# Patient Record
Sex: Female | Born: 1945 | Hispanic: No | Marital: Single | Smoking: Former smoker
Health system: Southern US, Community
[De-identification: ages and names within clinical notes are randomized; demographics above are authoritative.]

## PROBLEM LIST (undated history)

## (undated) DIAGNOSIS — G2581 Restless legs syndrome: Secondary | ICD-10-CM

## (undated) DIAGNOSIS — IMO0002 Reserved for concepts with insufficient information to code with codable children: Secondary | ICD-10-CM

## (undated) DIAGNOSIS — I513 Intracardiac thrombosis, not elsewhere classified: Secondary | ICD-10-CM

## (undated) DIAGNOSIS — F32A Depression, unspecified: Secondary | ICD-10-CM

## (undated) DIAGNOSIS — R51 Headache: Secondary | ICD-10-CM

## (undated) DIAGNOSIS — Z9229 Personal history of other drug therapy: Secondary | ICD-10-CM

## (undated) DIAGNOSIS — Z9981 Dependence on supplemental oxygen: Secondary | ICD-10-CM

## (undated) DIAGNOSIS — C801 Malignant (primary) neoplasm, unspecified: Secondary | ICD-10-CM

## (undated) DIAGNOSIS — I639 Cerebral infarction, unspecified: Secondary | ICD-10-CM

## (undated) DIAGNOSIS — J449 Chronic obstructive pulmonary disease, unspecified: Secondary | ICD-10-CM

## (undated) DIAGNOSIS — N189 Chronic kidney disease, unspecified: Secondary | ICD-10-CM

## (undated) DIAGNOSIS — I5022 Chronic systolic (congestive) heart failure: Secondary | ICD-10-CM

## (undated) DIAGNOSIS — I10 Essential (primary) hypertension: Secondary | ICD-10-CM

## (undated) DIAGNOSIS — Z8719 Personal history of other diseases of the digestive system: Secondary | ICD-10-CM

## (undated) DIAGNOSIS — D649 Anemia, unspecified: Secondary | ICD-10-CM

## (undated) DIAGNOSIS — D696 Thrombocytopenia, unspecified: Secondary | ICD-10-CM

## (undated) DIAGNOSIS — J45909 Unspecified asthma, uncomplicated: Secondary | ICD-10-CM

## (undated) DIAGNOSIS — G4730 Sleep apnea, unspecified: Secondary | ICD-10-CM

## (undated) DIAGNOSIS — I2510 Atherosclerotic heart disease of native coronary artery without angina pectoris: Secondary | ICD-10-CM

## (undated) DIAGNOSIS — I219 Acute myocardial infarction, unspecified: Secondary | ICD-10-CM

## (undated) DIAGNOSIS — M1990 Unspecified osteoarthritis, unspecified site: Secondary | ICD-10-CM

## (undated) DIAGNOSIS — F329 Major depressive disorder, single episode, unspecified: Secondary | ICD-10-CM

## (undated) DIAGNOSIS — I4819 Other persistent atrial fibrillation: Secondary | ICD-10-CM

## (undated) DIAGNOSIS — I272 Pulmonary hypertension, unspecified: Secondary | ICD-10-CM

## (undated) DIAGNOSIS — E119 Type 2 diabetes mellitus without complications: Secondary | ICD-10-CM

## (undated) DIAGNOSIS — R9430 Abnormal result of cardiovascular function study, unspecified: Secondary | ICD-10-CM

## (undated) HISTORY — DX: Reserved for concepts with insufficient information to code with codable children: IMO0002

## (undated) HISTORY — DX: Pulmonary hypertension, unspecified: I27.20

## (undated) HISTORY — PX: OTHER SURGICAL HISTORY: SHX169

## (undated) HISTORY — DX: Personal history of other drug therapy: Z92.29

## (undated) HISTORY — DX: Other persistent atrial fibrillation: I48.19

## (undated) HISTORY — PX: ABDOMINAL HYSTERECTOMY: SHX81

## (undated) HISTORY — PX: TONSILLECTOMY: SUR1361

## (undated) HISTORY — DX: Cerebral infarction, unspecified: I63.9

## (undated) HISTORY — DX: Abnormal result of cardiovascular function study, unspecified: R94.30

---

## 1998-03-31 HISTORY — PX: OTHER SURGICAL HISTORY: SHX169

## 1999-04-01 HISTORY — PX: CORONARY ARTERY BYPASS GRAFT: SHX141

## 2011-04-01 HISTORY — PX: CARDIAC CATHETERIZATION: SHX172

## 2012-03-25 ENCOUNTER — Inpatient Hospital Stay (HOSPITAL_COMMUNITY)
Admission: AD | Admit: 2012-03-25 | Discharge: 2012-04-19 | DRG: 280 | Disposition: A | Discharge status: Skilled Nursing Facility | Payer: Medicare Other | Source: Other Acute Inpatient Hospital | Attending: Cardiovascular Disease | Admitting: Cardiovascular Disease

## 2012-03-25 DIAGNOSIS — J449 Chronic obstructive pulmonary disease, unspecified: Secondary | ICD-10-CM | POA: Diagnosis present

## 2012-03-25 DIAGNOSIS — D696 Thrombocytopenia, unspecified: Secondary | ICD-10-CM | POA: Diagnosis not present

## 2012-03-25 DIAGNOSIS — J189 Pneumonia, unspecified organism: Secondary | ICD-10-CM | POA: Diagnosis not present

## 2012-03-25 DIAGNOSIS — I5023 Acute on chronic systolic (congestive) heart failure: Secondary | ICD-10-CM | POA: Diagnosis present

## 2012-03-25 DIAGNOSIS — N19 Unspecified kidney failure: Secondary | ICD-10-CM

## 2012-03-25 DIAGNOSIS — I252 Old myocardial infarction: Secondary | ICD-10-CM

## 2012-03-25 DIAGNOSIS — J9620 Acute and chronic respiratory failure, unspecified whether with hypoxia or hypercapnia: Secondary | ICD-10-CM | POA: Diagnosis not present

## 2012-03-25 DIAGNOSIS — I509 Heart failure, unspecified: Secondary | ICD-10-CM | POA: Diagnosis present

## 2012-03-25 DIAGNOSIS — I4729 Other ventricular tachycardia: Secondary | ICD-10-CM | POA: Diagnosis not present

## 2012-03-25 DIAGNOSIS — R29810 Facial weakness: Secondary | ICD-10-CM | POA: Diagnosis not present

## 2012-03-25 DIAGNOSIS — Z9861 Coronary angioplasty status: Secondary | ICD-10-CM

## 2012-03-25 DIAGNOSIS — J4489 Other specified chronic obstructive pulmonary disease: Secondary | ICD-10-CM | POA: Diagnosis present

## 2012-03-25 DIAGNOSIS — K219 Gastro-esophageal reflux disease without esophagitis: Secondary | ICD-10-CM | POA: Diagnosis present

## 2012-03-25 DIAGNOSIS — I2510 Atherosclerotic heart disease of native coronary artery without angina pectoris: Secondary | ICD-10-CM | POA: Diagnosis present

## 2012-03-25 DIAGNOSIS — I428 Other cardiomyopathies: Secondary | ICD-10-CM | POA: Diagnosis present

## 2012-03-25 DIAGNOSIS — I5189 Other ill-defined heart diseases: Secondary | ICD-10-CM | POA: Diagnosis present

## 2012-03-25 DIAGNOSIS — E662 Morbid (severe) obesity with alveolar hypoventilation: Secondary | ICD-10-CM | POA: Diagnosis present

## 2012-03-25 DIAGNOSIS — G8929 Other chronic pain: Secondary | ICD-10-CM | POA: Diagnosis present

## 2012-03-25 DIAGNOSIS — G4733 Obstructive sleep apnea (adult) (pediatric): Secondary | ICD-10-CM | POA: Diagnosis present

## 2012-03-25 DIAGNOSIS — I4891 Unspecified atrial fibrillation: Principal | ICD-10-CM | POA: Diagnosis present

## 2012-03-25 DIAGNOSIS — I6340 Cerebral infarction due to embolism of unspecified cerebral artery: Secondary | ICD-10-CM | POA: Diagnosis present

## 2012-03-25 DIAGNOSIS — G2581 Restless legs syndrome: Secondary | ICD-10-CM | POA: Diagnosis present

## 2012-03-25 DIAGNOSIS — I214 Non-ST elevation (NSTEMI) myocardial infarction: Secondary | ICD-10-CM | POA: Diagnosis not present

## 2012-03-25 DIAGNOSIS — Z951 Presence of aortocoronary bypass graft: Secondary | ICD-10-CM

## 2012-03-25 DIAGNOSIS — Z6841 Body Mass Index (BMI) 40.0 and over, adult: Secondary | ICD-10-CM

## 2012-03-25 DIAGNOSIS — I639 Cerebral infarction, unspecified: Secondary | ICD-10-CM

## 2012-03-25 DIAGNOSIS — Z8673 Personal history of transient ischemic attack (TIA), and cerebral infarction without residual deficits: Secondary | ICD-10-CM

## 2012-03-25 DIAGNOSIS — I2589 Other forms of chronic ischemic heart disease: Secondary | ICD-10-CM | POA: Diagnosis present

## 2012-03-25 DIAGNOSIS — H539 Unspecified visual disturbance: Secondary | ICD-10-CM | POA: Diagnosis not present

## 2012-03-25 DIAGNOSIS — N179 Acute kidney failure, unspecified: Secondary | ICD-10-CM | POA: Diagnosis present

## 2012-03-25 DIAGNOSIS — I2582 Chronic total occlusion of coronary artery: Secondary | ICD-10-CM | POA: Diagnosis present

## 2012-03-25 DIAGNOSIS — D649 Anemia, unspecified: Secondary | ICD-10-CM

## 2012-03-25 DIAGNOSIS — R5381 Other malaise: Secondary | ICD-10-CM | POA: Diagnosis not present

## 2012-03-25 DIAGNOSIS — R791 Abnormal coagulation profile: Secondary | ICD-10-CM | POA: Diagnosis not present

## 2012-03-25 DIAGNOSIS — I472 Ventricular tachycardia, unspecified: Secondary | ICD-10-CM | POA: Diagnosis not present

## 2012-03-25 DIAGNOSIS — I129 Hypertensive chronic kidney disease with stage 1 through stage 4 chronic kidney disease, or unspecified chronic kidney disease: Secondary | ICD-10-CM | POA: Diagnosis present

## 2012-03-25 DIAGNOSIS — N189 Chronic kidney disease, unspecified: Secondary | ICD-10-CM | POA: Diagnosis present

## 2012-03-25 DIAGNOSIS — D509 Iron deficiency anemia, unspecified: Secondary | ICD-10-CM | POA: Diagnosis present

## 2012-03-25 DIAGNOSIS — Z96659 Presence of unspecified artificial knee joint: Secondary | ICD-10-CM

## 2012-03-25 DIAGNOSIS — E875 Hyperkalemia: Secondary | ICD-10-CM | POA: Diagnosis not present

## 2012-03-25 DIAGNOSIS — E785 Hyperlipidemia, unspecified: Secondary | ICD-10-CM | POA: Diagnosis present

## 2012-03-25 HISTORY — DX: Sleep apnea, unspecified: G47.30

## 2012-03-25 HISTORY — DX: Anemia, unspecified: D64.9

## 2012-03-25 HISTORY — DX: Essential (primary) hypertension: I10

## 2012-03-25 HISTORY — DX: Personal history of other diseases of the digestive system: Z87.19

## 2012-03-25 HISTORY — DX: Acute myocardial infarction, unspecified: I21.9

## 2012-03-25 HISTORY — DX: Unspecified osteoarthritis, unspecified site: M19.90

## 2012-03-25 HISTORY — DX: Chronic systolic (congestive) heart failure: I50.22

## 2012-03-25 HISTORY — DX: Intracardiac thrombosis, not elsewhere classified: I51.3

## 2012-03-25 HISTORY — DX: Type 2 diabetes mellitus without complications: E11.9

## 2012-03-25 HISTORY — DX: Dependence on supplemental oxygen: Z99.81

## 2012-03-25 HISTORY — DX: Restless legs syndrome: G25.81

## 2012-03-25 HISTORY — DX: Chronic obstructive pulmonary disease, unspecified: J44.9

## 2012-03-25 HISTORY — DX: Morbid (severe) obesity due to excess calories: E66.01

## 2012-03-25 HISTORY — DX: Atherosclerotic heart disease of native coronary artery without angina pectoris: I25.10

## 2012-03-25 HISTORY — DX: Headache: R51

## 2012-03-25 HISTORY — DX: Chronic kidney disease, unspecified: N18.9

## 2012-03-25 HISTORY — DX: Depression, unspecified: F32.A

## 2012-03-25 HISTORY — DX: Major depressive disorder, single episode, unspecified: F32.9

## 2012-03-25 HISTORY — DX: Cerebral infarction, unspecified: I63.9

## 2012-03-25 HISTORY — DX: Thrombocytopenia, unspecified: D69.6

## 2012-03-25 MED ORDER — MORPHINE SULFATE 2 MG/ML IJ SOLN
1.0000 mg | INTRAMUSCULAR | Status: DC | PRN
  Administered 2012-03-29 – 2012-04-06 (×3): 1 mg via INTRAVENOUS
  Filled 2012-03-25 (×4): qty 1

## 2012-03-25 MED ORDER — ONDANSETRON HCL 4 MG/2ML IJ SOLN
4.0000 mg | Freq: Four times a day (QID) | INTRAMUSCULAR | Status: DC | PRN
  Administered 2012-04-08 – 2012-04-14 (×14): 4 mg via INTRAVENOUS
  Filled 2012-03-25 (×14): qty 2

## 2012-03-25 MED ORDER — HYDROCODONE-ACETAMINOPHEN 5-325 MG PO TABS
1.0000 | ORAL_TABLET | ORAL | Status: DC | PRN
  Administered 2012-03-28: 2 via ORAL
  Administered 2012-04-01 (×2): 1 via ORAL
  Administered 2012-04-07 – 2012-04-13 (×7): 2 via ORAL
  Filled 2012-03-25 (×4): qty 2
  Filled 2012-03-25: qty 1
  Filled 2012-03-25 (×5): qty 2

## 2012-03-25 MED ORDER — ALBUTEROL SULFATE (5 MG/ML) 0.5% IN NEBU: 2.5000 mg | INHALATION_SOLUTION | RESPIRATORY_TRACT | Status: DC | PRN

## 2012-03-25 MED ORDER — ZOLPIDEM TARTRATE 5 MG PO TABS: 5.0000 mg | ORAL_TABLET | Freq: Every evening | ORAL | Status: DC | PRN

## 2012-03-25 MED ORDER — SODIUM CHLORIDE 0.9 % IV SOLN
250.0000 mL | INTRAVENOUS | Status: DC | PRN
Start: 2012-03-25 — End: 2012-04-19
  Administered 2012-03-30: 20:00:00 via INTRAVENOUS
  Administered 2012-04-01: 500 mL via INTRAVENOUS

## 2012-03-25 MED ORDER — ASPIRIN EC 81 MG PO TBEC
81.0000 mg | DELAYED_RELEASE_TABLET | Freq: Every day | ORAL | Status: DC
  Administered 2012-03-26 – 2012-04-02 (×6): 81 mg via ORAL
  Filled 2012-03-25 (×8): qty 1

## 2012-03-25 MED ORDER — ACETAMINOPHEN 325 MG PO TABS
650.0000 mg | ORAL_TABLET | Freq: Four times a day (QID) | ORAL | Status: DC | PRN
  Administered 2012-04-05 – 2012-04-09 (×2): 650 mg via ORAL
  Filled 2012-03-25 (×3): qty 2

## 2012-03-25 MED ORDER — SODIUM CHLORIDE 0.9 % IJ SOLN
3.0000 mL | Freq: Two times a day (BID) | INTRAMUSCULAR | Status: DC
  Administered 2012-03-26 – 2012-04-01 (×9): 3 mL via INTRAVENOUS

## 2012-03-25 MED ORDER — ONDANSETRON HCL 4 MG PO TABS
4.0000 mg | ORAL_TABLET | Freq: Four times a day (QID) | ORAL | Status: DC | PRN
  Administered 2012-04-15: 4 mg via ORAL
  Filled 2012-03-25: qty 1

## 2012-03-25 MED ORDER — ENOXAPARIN SODIUM 30 MG/0.3ML ~~LOC~~ SOLN: 30.0000 mg | SUBCUTANEOUS | Status: DC

## 2012-03-25 MED ORDER — SODIUM CHLORIDE 0.9 % IJ SOLN: 3.0000 mL | INTRAMUSCULAR | Status: DC | PRN

## 2012-03-25 MED ORDER — ACETAMINOPHEN 650 MG RE SUPP: 650.0000 mg | Freq: Four times a day (QID) | RECTAL | Status: DC | PRN

## 2012-03-25 NOTE — Consult Note (Signed)
CARDIOLOGY CONSULT NOTE   Caitlyn Hardy MRN: [id number] DOB/AGE: Mar 29, 1946 66 y.o. Admit date: 03/25/2012    Primary Cardiologist: Pt follows at [facility]  Reason for consultation:  atrial fibrillation, HF  HPI:  Pt is a 66 yo woman with DM, HTN, CAD s/p CABG 2001 and PCI 2011, ICM EF 40-->20%, admitted to [facility] with AMS, SOB, found to have acute R cerebellar stroke, new EF drop to 20% and new AF, transferred to [facility] today for further care.  Cardiology consulted for management of pt's cardiac issues.  Prior to admission at [facility], pt was SOB and had AMS as well as some chest heaviness.  Her son is EMT, so took her HR and noticed it was elevated during her sx as well.  She does not have prior hx of atrial arrhythmia.  Her son states that pt has had LE edema since 2011, worse over the last 30 days.  Pt reports that for the last 6 weeks she has had chest heaviness that she has been talking nitro for, which has helped relieve the pain.  The pain has happened with exertion as well as with just sitting.  It feels similar to her prior MI, but with her prior MI she also had neck pain.  She does not have any chest pain currently.  Pt does not know what her baseline Cr is.  Per son, her baseline SBP is usually around a 100-106.  At this time, pt reports feeling better and denies any SOB or chest pain.       Review of systems: A review of 10 organ systems was done and is negative except as stated above in HPI  PMH:AF, anemia, CKD (unknown Cr baseline), COPD, DM, HLD, GERD, CAD s/p CABG and PCI, OSA, chronic pain, HTN, acute R cerebellar stroke, morbid obesity, ICM EF 20% by echo 03/23/12  PSH: hysterectomy, appy, knee and shoulder surgery  SH: nonsmoker  FH: brother with MI in 47s,  Both parents with "heart disease"  ALL: PCN, sulfa, all "mycin" abx MEDS: no discharge medication list sent over from [facility]   Physical Exam: Blood pressure 97/80, pulse 127,  temperature 97.7 F (36.5 C), temperature source Oral, resp. rate 17, height 5\' 6"  (1.676 m), weight 147.5 kg (325 lb 2.9 oz), SpO2 98.00%.; Body mass index is 52.49 kg/(m^2). Temp:  [97.7 F (36.5 C)] 97.7 F (36.5 C) (12/26 2230) Pulse Rate:  [127] 127  (12/26 2230) Resp:  [17] 17  (12/26 2230) BP: (97)/(80) 97/80 mmHg (12/26 2230) SpO2:  [98 %] 98 % (12/26 2230) Weight:  [147.5 kg (325 lb 2.9 oz)] 147.5 kg (325 lb 2.9 oz) (12/26 2230)  No intake or output data in the 24 hours ending 03/25/12 2303 General: NAD Heent: MMM Neck: prominent EJ pulsations, difficult to assess JVP given body habitus CV: irreg irreg, tachy, no m Lungs: Clear to auscultation bilaterally with normal respiratory effort Abdomen: Soft, obese, nontender, nondistended Extremities: No clubbing or cyanosis.  No pitting pedal edema Skin: ecchymosis over neck and ant chest Neurologic: grossly nonfocal  Psych: Normal mood and affect    Labs: 03/24/12 per OSH records 10.3>9.8<215 INR 4.4 Cr 1.47 K 4.9 BNP 929  12/23 EEG showing encephalopathy  Admission labs pending  Radiology: CXR pending  ASSESSMENT:  Pt is a 66 yo woman with DM, HTN, CAD s/p CABG 2001 and PCI 2011, ICM EF 40-->20%, admitted to [facility] with AMS, SOB, found to have acute R cerebellar  stroke, new EF drop to 20% and new AF, transferred to [facility] today for further care.  Cardiology consulted for management of pt's cardiac issues.  PLAN:  Systolic HF - EF drop from 40% in 2011 to 20% 03/23/12 - pt with 6 wks of CP prior to this admission, so this could have been caused by progression of CAD.  Saw trop of 0.19 in OSH records.  Plan to hold coumadin and do R and LHC this admission.   INR supratherapeutic - hold coumadin in anticipation of procedures and start heparin gtt when INR <2.0.  Cr also needs to improve back to baseline prior to cath - need to get records on what Cr baseline is.  Pt was on BB and ACEi at OSH, unknown doses.  Would  continue.  No med list provided at this time.  Would start metoprolol 12.5 mg q6h and uptitrate as BP and HR allow if unable to obtain prior dose of BB.  Pt does not appear grossly fluid up on exam, though body habitus makes assessment difficult.  Continue lasix dose she was on at OSH for now until we can get more information.  Check BNP as well.  Will not plan on ICD placement until pt has been cathed and has been on appropriate medical therapy for 3-6 months - she will only need ICD if EF remains below 35% on medical therapy and after revascularization if that is what she needs.     AF with RVR - would rate control at this time with BB and dig load.  please discuss dig dosing with pharmacy given renal function.  It is unclear if she got dig at OSH (it was discussed at one point in progress notes) - please check dig level to confirm.  Can consider TEE/DCCV and possible amio in future if unable to rate control.  This would have to be done after cath so that she could be on uninterrupted anticoagulation after cardioversion.  Pt was on dilt at OSH - please hold this in the setting of EF 20%, as this could precipitate fluid overload.  Chest pain x 6 weeks with new EF drop - plan for cath as above.  Trend enzymes, check serial EKGs  AKI on CKD - unknown Cr baseline.  Please obtain records on prior baseline  CAD s/p CABG and PCI - need to obtain records on current meds.  Needs cath this admission as above for chest pain and EF drop  AMS - work up and treatment per primary team  Code status - pt was DNR at one point during the hospitalization, but then rescinded it.  At this time she is still with intermittent AMS and unable to clarify fully.  Family will continue to try and talk to pt about it when she is lucid.  At this time, she is full code and would want an ICD if she needed one in the future   Thank you for this consultation.  Will continue to follow.  Recommendations discussed with admitting  physician.  Signed: Hilary Hertz, MD Cardiology Fellow 03/25/2012, 11:03 PM

## 2012-03-25 NOTE — Progress Notes (Signed)
Pt arrived to 3301.  Oriented to room and unit.  Family at bedside.  Pt alert and oriented, but confused occasionally.  Heart rate 100-150 a-fib.  BP 97/80.  NS at [facility].  No complaints of pain.  Shortness of breath intermittently.  No orders.  Admitting MD paged and [place] cards paged.  Will continue to monitor.    Maximino Greenland RN

## 2012-03-25 NOTE — H&P (Addendum)
[facility]                                                                                    Patient Demographics  Caitlyn Hardy, is a 66 y.o. female  CSN: [id number]  MRN: [id number]  DOB - Jun 24, 1945  Admit Date - 03/25/2012  Outpatient Primary MD for the patient is No primary provider on file.   With History of -  No past medical history on file.    No past surgical history on file.  in for   Atrial fib with rapid ventricular rate /congestive heart failure  HPI  Caitlyn Hardy  is a 66 y.o. female, with past medical history significant for coronary artery disease status post stent placement in 2001 and history of congestive heart failure with ejection fraction of 40% on 2011. The patient was transferred from [facility] upon her request. No discharge summary or discharge medications were available at the time of dictation of this note. No labs were available as well. According to the son who gave most of the history, the patient was admitted with change in mental status on the 22nd of this month and was diagnosed to have a cerebellar infarct by MRI. The patient's heart rate started to increase and she went into active fib with rapid ventricular rate, for which she was started on Cardizem and beta blockers. No reports about digitoxin being started. I tried to contact the patient's doctor at [facility] however she was not available. In our step down unit, the patient denies any chest pains, however she has chronic shortness of breath and complains of back pain her heart rate is ranging from 105 to 130 . Patient also denies abdominal pain or diarrhea.  PMH: History of coronary artery disease status post bypass surgery in 2001 History of COPD Obesity Congestive heart failure with ejection 20% last echo done at [facility] prior to admission Active fib with rapid ventricular rate Hyperlipidemia Hypertension Chronic pain Restless leg  syndrome  Past surgical history Status post tonsillectomy, hysterectomy, rhinoplasty, appendectomy, CABG in 2001 at [facility], right shoulder surgery, right knee replacement   Review of Systems    In addition to the HPI above, No Fever-chills, No Headache, No changes with Vision or hearing, No problems swallowing food or Liquids, No Chest pain, Cough, shortness of Breath is present , No Abdominal pain, No Nausea or Vommitting, Bowel movements are regular, No Blood in stool or Urine, No dysuria, No new skin rashes or bruises, No new joints pains-aches,  No new weakness, tingling, numbness in any extremity, No recent weight gain or loss, No polyuria, polydypsia or polyphagia, No significant Mental Stressors.  A full 10 point Review of Systems was done, except as stated above, all other Review of Systems were negative.   Social History Patient has a distant history of smoking and alcohol abuse lives at home alone with care from her son at times.   Family History CAD  Prior to Admission medications   Not on File    Allergies: Penicillin,, sulfa, azithromycin, Bactrim, trimethoprim  Physical Exam  Vitals  Blood pressure 97/80, pulse 127, temperature 97.7 F (36.5 C),  temperature source Oral, resp. rate 17, height 5\' 6"  (1.676 m), weight 147.5 kg (325 lb 2.9 oz), SpO2 98.00%.   1. comfortable lying in bed looks chronically ill  2. Normal affect and insight, Not Suicidal or Homicidal, Awake Alert, Oriented X 3.  3. No F.N deficits, ALL C.Nerves Intact, Strength 5/5 all 4 extremities, Sensation intact all 4 extremities, Plantars down going.  4. Ears and Eyes appear Normal, Conjunctivae clear, PERRLA. Moist Oral Mucosa.  5. Supple Neck, No JVD, No cervical lymphadenopathy appriciated, No Carotid Bruits.  6. Symmetrical Chest wall movement, Good air movement bilaterally, bilateral crackles bases with mild rhonchi scattered.  7. RRR, positive for Gallop Rubs or Murmurs,  Parasternal Heave present.  8. Positive Bowel Sounds, Abdomen Soft, Non tender, No organomegaly appriciated,No rebound -guarding or rigidity.  9.  No Cyanosis, Normal Skin Turgor, No Skin Rash or Bruise.  10. Good muscle tone,  joints appear normal , no effusions, Normal ROM. Lower extremity edema noted  11. No Palpable Lymph Nodes in Neck or Axillae    Data Review  ------------------------------------------------------------------------------------------------------------------  Chemistries      Imaging results:   No results found.  My personal review of EKG: From [facility] showed active fib with rapid ventricular rate QRS is not prolonged  Personally reviewed Old Chart from [facility]  Assessment & Plan  Principal Problem:  *Atrial fibrillation with RVR Active Problems:  CVA (cerebral infarction)  CHF (congestive heart failure)  CAD (coronary artery disease)  Renal failure    1.Atrial fibrillation with RVR  . I'm not sure if patient was on digoxin so we'll check digoxin level. Discussed with cardiology. Will start patient on metoprolol 12.5 mg by mouth every 6 hours and digoxin per pharmacy. Will check digoxin level first. Hair INR was approximately 4.4 last time seen in [place] we do not have official reports yet. We will hold off the Lovenox. Will check echocardiogram in a.m. 2. Congestive heart failure: According to the son the last ejection fraction is 20% and this was also noted to normal the progress notes. We'll start her home dose Lasix, the patient doesn't look fluid overloaded 3. History of COPD: Patient will be started on albuterol by med 4. History of diabetes: Insulin sliding scale will be started 5. History of cerebellar CVA awaiting official reports from [facility]. 6. Renal failure acute on chronic Awaiting labs. 7. Chronic back pain: We'll place lidocaine patch 8. History of carotid artery disease status post CABG in 2001 and  stent placement in 2011.  DVT Prophylaxis none AM Labs Ordered, also please review Full Orders  Family Communication: Admission, patients condition and plan of care including tests being ordered have been discussed with the patient and son who indicate understanding and agree with the plan and Code Status.  Code Status full  Disposition Plan: Admit to step down a and of nd probably discharge home with home health in stable  Time spent in minutes : 50 minutes  Condition GUARDED   This addendum is dictated at 2 AM: Part of the patient's labs came back she has a sodium of 137 potassium 5.0 BUN 16 with creatinine of 1.68 her blood sugar was 165. B. NP was 21308 Urinalysis shows hematuria Digoxin level was 0.3, so patient was started on digoxin 0.25 mg IV every 2 hours x2 doses only been to be managed by pharmacy Her medication list also came back from the nurses and I noticed that the patient is on effient 10  mg daily she is also on multiple other medications however the official discharge medication list was not present. We'll start the patient on Lipitor Advair Singulair Protonix Requip and trazodone. INR is still pending.

## 2012-03-25 NOTE — Progress Notes (Signed)
Patient accepted for transfer to our service at request of Dr. Johney Frame: 66 yo F coming from [place], A.fib RVR, CHF, troponin of 0.xx, had been admitted to [facility] for basal ganglia stroke.  Call [phone] when patient arrives.  [place] cards will also see patient tonight on arrival and should be paged.

## 2012-03-26 ENCOUNTER — Inpatient Hospital Stay (HOSPITAL_COMMUNITY): Payer: Medicare Other

## 2012-03-26 ENCOUNTER — Encounter (HOSPITAL_COMMUNITY): Payer: Self-pay | Admitting: Acute Care

## 2012-03-26 DIAGNOSIS — I5023 Acute on chronic systolic (congestive) heart failure: Secondary | ICD-10-CM | POA: Diagnosis present

## 2012-03-26 DIAGNOSIS — D649 Anemia, unspecified: Secondary | ICD-10-CM

## 2012-03-26 DIAGNOSIS — I2510 Atherosclerotic heart disease of native coronary artery without angina pectoris: Secondary | ICD-10-CM

## 2012-03-26 DIAGNOSIS — D509 Iron deficiency anemia, unspecified: Secondary | ICD-10-CM | POA: Diagnosis present

## 2012-03-26 DIAGNOSIS — I059 Rheumatic mitral valve disease, unspecified: Secondary | ICD-10-CM

## 2012-03-26 DIAGNOSIS — N19 Unspecified kidney failure: Secondary | ICD-10-CM

## 2012-03-26 DIAGNOSIS — I6350 Cerebral infarction due to unspecified occlusion or stenosis of unspecified cerebral artery: Secondary | ICD-10-CM

## 2012-03-26 DIAGNOSIS — I4891 Unspecified atrial fibrillation: Principal | ICD-10-CM

## 2012-03-26 LAB — URINALYSIS, MICROSCOPIC ONLY
Bilirubin Urine: NEGATIVE
Nitrite: NEGATIVE
Specific Gravity, Urine: 1.024 (ref 1.005–1.030)
Urobilinogen, UA: 1 mg/dL (ref 0.0–1.0)
pH: 5.5 (ref 5.0–8.0)

## 2012-03-26 LAB — PHOSPHORUS: Phosphorus: 2.8 mg/dL (ref 2.3–4.6)

## 2012-03-26 LAB — BASIC METABOLIC PANEL
BUN: 65 mg/dL — ABNORMAL HIGH (ref 6–23)
CO2: 20 mEq/L (ref 19–32)
Chloride: 106 mEq/L (ref 96–112)
Creatinine, Ser: 1.55 mg/dL — ABNORMAL HIGH (ref 0.50–1.10)
Potassium: 4.7 mEq/L (ref 3.5–5.1)

## 2012-03-26 LAB — COMPREHENSIVE METABOLIC PANEL
BUN: 60 mg/dL — ABNORMAL HIGH (ref 6–23)
CO2: 20 mEq/L (ref 19–32)
Chloride: 105 mEq/L (ref 96–112)
Creatinine, Ser: 1.68 mg/dL — ABNORMAL HIGH (ref 0.50–1.10)
GFR calc non Af Amer: 31 mL/min — ABNORMAL LOW (ref >90)
Glucose, Bld: 165 mg/dL — ABNORMAL HIGH (ref 70–99)
Total Bilirubin: 0.5 mg/dL (ref 0.3–1.2)

## 2012-03-26 LAB — GLUCOSE, CAPILLARY
Glucose-Capillary: 142 mg/dL — ABNORMAL HIGH (ref 70–99)
Glucose-Capillary: 130 mg/dL — ABNORMAL HIGH (ref 70–99)

## 2012-03-26 LAB — DIFFERENTIAL
Eosinophils Relative: 0 % (ref 0–5)
Lymphocytes Relative: 11 % — ABNORMAL LOW (ref 12–46)
Monocytes Relative: 10 % (ref 3–12)
Neutrophils Relative %: 79 % — ABNORMAL HIGH (ref 43–77)

## 2012-03-26 LAB — PROTIME-INR: INR: 2.52 — ABNORMAL HIGH (ref 0.00–1.49)

## 2012-03-26 LAB — CBC
HCT: 29.6 % — ABNORMAL LOW (ref 36.0–46.0)
Hemoglobin: 8.8 g/dL — ABNORMAL LOW (ref 12.0–15.0)
MCV: 72.5 fL — ABNORMAL LOW (ref 78.0–100.0)
RDW: 19.4 % — ABNORMAL HIGH (ref 11.5–15.5)
WBC: 10.4 10*3/uL (ref 4.0–10.5)

## 2012-03-26 LAB — MAGNESIUM: Magnesium: 2.6 mg/dL — ABNORMAL HIGH (ref 1.5–2.5)

## 2012-03-26 LAB — HEMOGLOBIN A1C: Mean Plasma Glucose: 157 mg/dL — ABNORMAL HIGH (ref <117)

## 2012-03-26 MED ORDER — FUROSEMIDE 40 MG PO TABS
40.0000 mg | ORAL_TABLET | Freq: Every day | ORAL | Status: DC
  Filled 2012-03-26: qty 1

## 2012-03-26 MED ORDER — METOPROLOL TARTRATE 25 MG PO TABS
25.0000 mg | ORAL_TABLET | Freq: Once | ORAL | Status: AC
  Administered 2012-03-26: 25 mg via ORAL
  Filled 2012-03-26: qty 1

## 2012-03-26 MED ORDER — METOPROLOL TARTRATE 50 MG PO TABS
50.0000 mg | ORAL_TABLET | Freq: Four times a day (QID) | ORAL | Status: DC
  Administered 2012-03-26 – 2012-03-27 (×2): 50 mg via ORAL
  Filled 2012-03-26 (×7): qty 1

## 2012-03-26 MED ORDER — MUPIROCIN 2 % EX OINT
1.0000 "application " | TOPICAL_OINTMENT | Freq: Two times a day (BID) | CUTANEOUS | Status: AC
  Administered 2012-03-26 – 2012-03-30 (×10): 1 via NASAL
  Filled 2012-03-26 (×2): qty 22

## 2012-03-26 MED ORDER — PANTOPRAZOLE SODIUM 40 MG PO TBEC
40.0000 mg | DELAYED_RELEASE_TABLET | Freq: Every day | ORAL | Status: DC
  Administered 2012-03-26 – 2012-04-01 (×7): 40 mg via ORAL
  Filled 2012-03-26 (×7): qty 1

## 2012-03-26 MED ORDER — METOPROLOL TARTRATE 25 MG PO TABS
25.0000 mg | ORAL_TABLET | Freq: Four times a day (QID) | ORAL | Status: DC
  Administered 2012-03-26: 25 mg via ORAL
  Filled 2012-03-26 (×4): qty 1

## 2012-03-26 MED ORDER — CHLORHEXIDINE GLUCONATE CLOTH 2 % EX PADS
6.0000 | MEDICATED_PAD | Freq: Every day | CUTANEOUS | Status: AC
  Administered 2012-03-26 – 2012-03-30 (×5): 6 via TOPICAL

## 2012-03-26 MED ORDER — LIDOCAINE 5 % EX PTCH
1.0000 | MEDICATED_PATCH | Freq: Every morning | CUTANEOUS | Status: AC
  Administered 2012-03-26: 1 via TRANSDERMAL
  Filled 2012-03-26: qty 1

## 2012-03-26 MED ORDER — MAGNESIUM HYDROXIDE 400 MG/5ML PO SUSP
30.0000 mL | Freq: Once | ORAL | Status: AC
  Administered 2012-03-26: 30 mL via ORAL
  Filled 2012-03-26: qty 30

## 2012-03-26 MED ORDER — MONTELUKAST SODIUM 10 MG PO TABS
10.0000 mg | ORAL_TABLET | Freq: Every day | ORAL | Status: DC
  Administered 2012-03-27 – 2012-04-18 (×22): 10 mg via ORAL
  Filled 2012-03-26 (×25): qty 1

## 2012-03-26 MED ORDER — INSULIN ASPART 100 UNIT/ML ~~LOC~~ SOLN
0.0000 [IU] | Freq: Three times a day (TID) | SUBCUTANEOUS | Status: DC
  Administered 2012-03-26: 3 [IU] via SUBCUTANEOUS
  Administered 2012-03-26 – 2012-04-04 (×5): 2 [IU] via SUBCUTANEOUS

## 2012-03-26 MED ORDER — BUDESONIDE 0.5 MG/2ML IN SUSP
0.5000 mg | Freq: Two times a day (BID) | RESPIRATORY_TRACT | Status: DC
  Administered 2012-03-26 – 2012-04-19 (×43): 0.5 mg via RESPIRATORY_TRACT
  Filled 2012-03-26 (×53): qty 2

## 2012-03-26 MED ORDER — POLYETHYLENE GLYCOL 3350 17 G PO PACK
17.0000 g | PACK | Freq: Every day | ORAL | Status: DC
  Administered 2012-03-26 – 2012-04-18 (×12): 17 g via ORAL
  Filled 2012-03-26 (×25): qty 1

## 2012-03-26 MED ORDER — DIGOXIN 125 MCG PO TABS
0.1250 mg | ORAL_TABLET | Freq: Every day | ORAL | Status: DC
  Administered 2012-03-26 – 2012-03-30 (×5): 0.125 mg via ORAL
  Filled 2012-03-26 (×5): qty 1

## 2012-03-26 MED ORDER — ATORVASTATIN CALCIUM 10 MG PO TABS
10.0000 mg | ORAL_TABLET | Freq: Every day | ORAL | Status: DC
  Administered 2012-03-26 – 2012-04-18 (×21): 10 mg via ORAL
  Filled 2012-03-26 (×25): qty 1

## 2012-03-26 MED ORDER — ROPINIROLE HCL 1 MG PO TABS
1.0000 mg | ORAL_TABLET | Freq: Two times a day (BID) | ORAL | Status: DC
  Administered 2012-03-26 – 2012-04-03 (×15): 1 mg via ORAL
  Filled 2012-03-26 (×20): qty 1

## 2012-03-26 MED ORDER — METOPROLOL TARTRATE 12.5 MG HALF TABLET
12.5000 mg | ORAL_TABLET | Freq: Four times a day (QID) | ORAL | Status: DC
  Administered 2012-03-26: 12.5 mg via ORAL
  Filled 2012-03-26 (×5): qty 1

## 2012-03-26 MED ORDER — DIGOXIN 0.25 MG/ML IJ SOLN
0.2500 mg | INTRAMUSCULAR | Status: AC
  Administered 2012-03-26 (×2): 0.25 mg via INTRAVENOUS
  Filled 2012-03-26 (×2): qty 1

## 2012-03-26 MED ORDER — TRAZODONE HCL 150 MG PO TABS
150.0000 mg | ORAL_TABLET | Freq: Every day | ORAL | Status: DC
  Administered 2012-03-28 – 2012-04-18 (×21): 150 mg via ORAL
  Filled 2012-03-26 (×25): qty 1

## 2012-03-26 NOTE — Progress Notes (Signed)
CONSULT NOTE - Initial Consult  Pharmacy Consult for Digoxin Indication: Afib  Allergies  Allergen Reactions  . Azithromycin   . Bactrim (Sulfamethoxazole W-Trimethoprim)   . Penicillins   . Sulfa Antibiotics     Patient Measurements: Height: 5\' 6"  (167.6 cm) Weight: 325 lb 2.9 oz (147.5 kg) IBW/kg (Calculated) : 59.3   Vital Signs: Temp: 97.4 F (36.3 C) (12/27 0000) Temp src: Oral (12/27 0000) BP: 100/65 mmHg (12/27 0000) Pulse Rate: 75  (12/27 0000)  Labs:  Basename 03/26/12 0008  HGB --  HCT --  PLT --  APTT --  LABPROT --  INR --  HEPARINUNFRC --  CREATININE 1.68*  CKTOTAL --  CKMB --  TROPONINI <0.30  Digoxin level 0.3  INR 4.4 12/25 at [facility] INR 1.8 12/23 at [facility]   Estimated Creatinine Clearance: 49.2 ml/min (by C-G formula based on Cr of 1.68).   Medical History: CAD s/p CABG 2001 Htn CHF DM COPD OSA PUD Chronic Pain  Restless legs syndrome  Inpatient Medications (at [facility]):  Vancomycin 2 g IV q36h Coumadin 7.5 mg daily (held 2/2 supratherapeutic INR) Albuterol  Lipitor  Flexeril  Cardizem  Prozac  Advair  Novolog  Lidocaine patch  Zestril  Lopressor  Singulair  Protonix  Prasugrel  Requip  Topamax  Demadex  Trazodone  Assessment: 66 yo female with Afib for Digoxin  Digoxin 0.25 mg IV q2h x 2 ordered.    Goal of Therapy:  Digoxin level 0.8-2  Plan:  Digoxin 0.125 mg po daily  F/U renal function F/U plans for anticoagulation  Bud Kaeser, Gary Fleet 03/26/2012,3:14 AM

## 2012-03-26 NOTE — Progress Notes (Signed)
  Echocardiogram 2D Echocardiogram has been performed.  Georgian Co 03/26/2012, 10:55 AM

## 2012-03-26 NOTE — Progress Notes (Signed)
Pt very confused, trying to get out of bed, pulled off ID bracelet, and pulled out IV.  Reoriented pt, inserted new IV.  Pt calm and cooperative, yet still confused.  Pt now resting.  Will continue to monitor.    Maximino Greenland RN

## 2012-03-26 NOTE — Progress Notes (Signed)
SUBJECTIVE: The patient is stable today.  At this time, she denies chest pain, shortness of breath, or any new concerns.     Marland Kitchen aspirin EC  81 mg Oral Daily  . atorvastatin  10 mg Oral q1800  . budesonide  0.5 mg Nebulization BID  . digoxin  0.125 mg Oral Daily  . insulin aspart  0-15 Units Subcutaneous TID WC  . lidocaine  1 patch Transdermal q morning - 10a  . metoprolol tartrate  25 mg Oral Q6H  . montelukast  10 mg Oral QHS  . pantoprazole  40 mg Oral Daily  . rOPINIRole  1 mg Oral BID  . sodium chloride  3 mL Intravenous Q12H  . traZODone  150 mg Oral QHS      OBJECTIVE: Physical Exam: Filed Vitals:   03/25/12 2230 03/26/12 0000 03/26/12 0400  BP: 97/80 100/65 94/67  Pulse: 127 75 124  Temp: 97.7 F (36.5 C) 97.4 F (36.3 C) 98 F (36.7 C)  TempSrc: Oral Oral Oral  Resp: 17 20 15   Height: 5\' 6"  (1.676 m)    Weight: 325 lb 2.9 oz (147.5 kg)    SpO2: 98% 99% 97%    Intake/Output Summary (Last 24 hours) at 03/26/12 1610 Last data filed at 03/26/12 0400  Gross per 24 hour  Intake      0 ml  Output    250 ml  Net   -250 ml    Telemetry reveals afib with elevated ventricular rates  GEN- The patient is obese appearing, alert but confused Head- normocephalic, atraumatic Eyes-  Sclera clear, conjunctiva pink Ears- hearing intact Oropharynx- clear Neck- supple, L EJ is quite firm and ecchymotic at a site of a venous stick,  Diffuse but significant bruising around her neck anteriorly Lymph- no cervical lymphadenopathy Lungs- Clear to ausculation bilaterally, normal work of breathing Heart- irregular rate and rhythm,  GI- soft, NT, ND, + BS Extremities- no clubbing, cyanosis, dependant edema Skin- ecchymosis diffusely Psych- confused Neuro- moves all extremities  LABS: Basic Metabolic Panel:  Basename 03/26/12 0630 03/26/12 0008  NA 138 137  K 4.7 5.0  CL 106 105  CO2 20 20  GLUCOSE 120* 165*  BUN 65* 60*  CREATININE 1.55* 1.68*  CALCIUM 8.9 8.8    MG -- 2.6*  PHOS -- 2.8   Liver Function Tests:  Basename 03/26/12 0008  AST 22  ALT 44*  ALKPHOS 77  BILITOT 0.5  PROT 5.9*  ALBUMIN 3.5   No results found for this basename: LIPASE:2,AMYLASE:2 in the last 72 hours CBC:  Basename 03/26/12 0630  WBC 10.4  NEUTROABS 8.3*  HGB 8.8*  HCT 29.6*  MCV 72.5*  PLT 166   Cardiac Enzymes:  Basename 03/26/12 0630 03/26/12 0008  CKTOTAL -- --  CKMB -- --  CKMBINDEX -- --  TROPONINI <0.30 <0.30   RADIOLOGY: Portable Chest 1 View  03/26/2012  *RADIOLOGY REPORT*  Clinical Data: CHF  PORTABLE CHEST - 1 VIEW  Comparison: None.  Findings: Previous CABG.  Mild cardiomegaly.  Relatively low lung volumes with resultant crowding of perihilar bronchovascular structures.  Cannot exclude mild central pulmonary vascular congestion.  No effusion.  IMPRESSION:  1.  Cardiomegaly with question of mild central pulmonary vascular congestion.   Original Report Authenticated By: D. Andria Rhein, MD     ASSESSMENT AND PLAN:  Principal Problem:  *Atrial fibrillation with RVR Active Problems:  CVA (cerebral infarction)  CHF (congestive heart failure)  CAD (coronary artery disease)  Renal failure  PLAN:  1. Acute on chronic systolic HF - EF drop from 40% in 2011 to 20% 03/23/12  Obtain echo RHC and LHC early next week  2. Afib- V rates elevated Increase metoprolol to 25mg  Q6hours, consider increasing to 50mg  q5 hours if HR remain above goal  INR supratherapeutic - hold coumadin in anticipation of procedures and start heparin gtt when INR <2.0.  Can consider TEE/DCCV and possible amio in future if unable to rate control. This would have to be done after cath so that she could be on uninterrupted anticoagulation after cardioversion.  3. Acute on chronic renal failure Appears intravascularly dry with extravascular fluid Would hold lasix for now and follow creatinine  4. Microcytic anemia Workup per primary team  5. AMS - work up and  treatment per primary team   6. Recent CVA Continue long term anticoagulation Hold coumadin and bridge with heparin for cath  Code status - presently full code  Hillis Range, MD 03/26/2012 8:19 AM

## 2012-03-26 NOTE — Progress Notes (Signed)
Utilization review completed.  P.J. Ethanjames Fontenot,RN,BSN Case Manager [phone]  

## 2012-03-26 NOTE — Progress Notes (Signed)
[facility] PROGRESS NOTE  Caitlyn Hardy ZOX:[id number] DOB: July 26, 1945 DOA: 03/25/2012 PCP: No primary provider on file.  HPI/Subjective: Patient feels better, her son's fiance (she is in EMT) is at bedside.   Assessment/Plan:  Acute on chronic systolic CHF -Left ventricle ejection fraction is 20% checked on 03/23/2012 an outside hospital, LVEF was 40% in 2011. -Surprisingly patient is not short of breath, not overtly fluid overloaded, cardiology following. -Patient is on metoprolol 25 mg every 6 hours, digoxin. -Was on Lasix this is been on hold because of marginal renal function.  Atrial fibrillation with RVR -Rate control with metoprolol, she is on digoxin too, cardiology following. -patient is on Coumadin, INR is 2.5. Pharmacy to dose the Coumadin.  Renal failure -Assumed acute renal failure, no records in the hospital here. -presents with creatinine of 1.68, creatinine today is 1.55. Followup BMP closely.  Altered mental status -Per family this happen towards night time, she is awake alert oriented x3 now. -It could be secondary to sundowning, we will try to avoid narcotics, benzos and hypnotics.  Recent CVA -Await the complete report from [facility], unsure if it's cerebellar or basal ganglia stroke.  Diabetes mellitus type 2 -Carbohydrate modified diet, insulin sliding scale and will check hemoglobin A1c.  Code Status: Full code Family Communication:  Disposition Plan: remains inpatient   Consultants:  [place] cardiology  Procedures:  none  Antibiotics:  none  Objective: Filed Vitals:   03/26/12 0400 03/26/12 0724 03/26/12 0949 03/26/12 1116  BP: 94/67 118/82  106/83  Pulse: 124 82 131 134  Temp: 98 F (36.7 C) 98 F (36.7 C)    TempSrc: Oral Oral    Resp: 15 22 19 22   Height:      Weight:      SpO2: 97% 97%  96%    Intake/Output Summary (Last 24 hours) at 03/26/12 1233 Last data filed at 03/26/12 0800  Gross per 24 hour    Intake    240 ml  Output    400 ml  Net   -160 ml   Filed Weights   03/25/12 2230  Weight: 147.5 kg (325 lb 2.9 oz)    Exam:  General: Alert and awake, oriented x3, not in any acute distress. HEENT: anicteric sclera, pupils reactive to light and accommodation, EOMI CVS: S1-S2 clear, no murmur rubs or gallops Chest: clear to auscultation bilaterally, no wheezing, rales or rhonchi Abdomen: soft nontender, nondistended, normal bowel sounds, no organomegaly Extremities: no cyanosis, clubbing or edema noted bilaterally Neuro: Cranial nerves II-XII intact, no focal neurological deficits  Data Reviewed: Basic Metabolic Panel:  Lab 03/26/12 8119 03/26/12 0008  NA 138 137  K 4.7 5.0  CL 106 105  CO2 20 20  GLUCOSE 120* 165*  BUN 65* 60*  CREATININE 1.55* 1.68*  CALCIUM 8.9 8.8  MG -- 2.6*  PHOS -- 2.8   Liver Function Tests:  Lab 03/26/12 0008  AST 22  ALT 44*  ALKPHOS 77  BILITOT 0.5  PROT 5.9*  ALBUMIN 3.5   No results found for this basename: LIPASE:5,AMYLASE:5 in the last 168 hours No results found for this basename: AMMONIA:5 in the last 168 hours CBC:  Lab 03/26/12 0630  WBC 10.4  NEUTROABS 8.3*  HGB 8.8*  HCT 29.6*  MCV 72.5*  PLT 166   Cardiac Enzymes:  Lab 03/26/12 0630 03/26/12 0008  CKTOTAL -- --  CKMB -- --  CKMBINDEX -- --  TROPONINI <0.30 <0.30   BNP (last 3 results)  Basename 03/26/12 0008  PROBNP 10380.0*   CBG:  Lab 03/26/12 0820  GLUCAP 110*    Recent Results (from the past 240 hour(s))  MRSA PCR SCREENING     Status: Abnormal   Collection Time   03/25/12 11:28 PM      Component Value Range Status Comment   MRSA by PCR POSITIVE (*) NEGATIVE Final      Studies: Portable Chest 1 View  03/26/2012  *RADIOLOGY REPORT*  Clinical Data: CHF  PORTABLE CHEST - 1 VIEW  Comparison: None.  Findings: Previous CABG.  Mild cardiomegaly.  Relatively low lung volumes with resultant crowding of perihilar bronchovascular structures.  Cannot  exclude mild central pulmonary vascular congestion.  No effusion.  IMPRESSION:  1.  Cardiomegaly with question of mild central pulmonary vascular congestion.   Original Report Authenticated By: D. Andria Rhein, MD     Scheduled Meds:   . aspirin EC  81 mg Oral Daily  . atorvastatin  10 mg Oral q1800  . budesonide  0.5 mg Nebulization BID  . Chlorhexidine Gluconate Cloth  6 each Topical Q0600  . digoxin  0.125 mg Oral Daily  . insulin aspart  0-15 Units Subcutaneous TID WC  . lidocaine  1 patch Transdermal q morning - 10a  . metoprolol tartrate  25 mg Oral Q6H  . montelukast  10 mg Oral QHS  . mupirocin ointment  1 application Nasal BID  . pantoprazole  40 mg Oral Daily  . rOPINIRole  1 mg Oral BID  . sodium chloride  3 mL Intravenous Q12H  . traZODone  150 mg Oral QHS   Continuous Infusions:   Principal Problem:  *Atrial fibrillation with RVR Active Problems:  CVA (cerebral infarction)  CHF (congestive heart failure)  CAD (coronary artery disease)  Renal failure  Acute on chronic systolic heart failure  Anemia    Time spent: 35 minutes    [facility] A  [facility] Pager [phone]. If 8PM-8AM, please contact night-coverage at [email or web address], password [facility] 03/26/2012, 12:33 PM  LOS: 1 day

## 2012-03-27 ENCOUNTER — Encounter (HOSPITAL_COMMUNITY): Payer: Self-pay | Admitting: Acute Care

## 2012-03-27 LAB — BASIC METABOLIC PANEL
BUN: 55 mg/dL — ABNORMAL HIGH (ref 6–23)
Calcium: 9.1 mg/dL (ref 8.4–10.5)
GFR calc non Af Amer: 40 mL/min — ABNORMAL LOW (ref >90)
Glucose, Bld: 118 mg/dL — ABNORMAL HIGH (ref 70–99)

## 2012-03-27 LAB — URINE CULTURE
Colony Count: NO GROWTH
Culture: NO GROWTH

## 2012-03-27 LAB — CBC
Hemoglobin: 9.2 g/dL — ABNORMAL LOW (ref 12.0–15.0)
MCH: 20 pg — ABNORMAL LOW (ref 26.0–34.0)
MCHC: 27.4 g/dL — ABNORMAL LOW (ref 30.0–36.0)
MCV: 73.2 fL — ABNORMAL LOW (ref 78.0–100.0)
Platelets: 158 10*3/uL (ref 150–400)

## 2012-03-27 LAB — GLUCOSE, CAPILLARY: Glucose-Capillary: 129 mg/dL — ABNORMAL HIGH (ref 70–99)

## 2012-03-27 MED ORDER — HEPARIN (PORCINE) IN NACL 100-0.45 UNIT/ML-% IJ SOLN
1250.0000 [IU]/h | INTRAMUSCULAR | Status: DC
  Administered 2012-03-27: 1250 [IU]/h via INTRAVENOUS
  Filled 2012-03-27: qty 250

## 2012-03-27 MED ORDER — METOPROLOL TARTRATE 50 MG PO TABS
75.0000 mg | ORAL_TABLET | Freq: Four times a day (QID) | ORAL | Status: DC
  Administered 2012-03-27 – 2012-04-02 (×21): 75 mg via ORAL
  Filled 2012-03-27 (×30): qty 1

## 2012-03-27 MED ORDER — HEPARIN BOLUS VIA INFUSION
4000.0000 [IU] | Freq: Once | INTRAVENOUS | Status: AC
  Administered 2012-03-27: 4000 [IU] via INTRAVENOUS
  Filled 2012-03-27: qty 4000

## 2012-03-27 MED ORDER — METOPROLOL TARTRATE 1 MG/ML IV SOLN
10.0000 mg | Freq: Once | INTRAVENOUS | Status: AC
  Administered 2012-03-27: 10 mg via INTRAVENOUS
  Filled 2012-03-27: qty 10

## 2012-03-27 MED ORDER — ENOXAPARIN SODIUM 150 MG/ML ~~LOC~~ SOLN
1.0000 mg/kg | Freq: Two times a day (BID) | SUBCUTANEOUS | Status: DC
  Administered 2012-03-27 – 2012-03-28 (×2): 145 mg via SUBCUTANEOUS
  Filled 2012-03-27 (×2): qty 1

## 2012-03-27 NOTE — Progress Notes (Addendum)
ANTICOAGULATION CONSULT NOTE - Initial Consult  Pharmacy Consult for heparin/ Lovenox Indication: atrial fibrillation  Allergies  Allergen Reactions  . Penicillins Anaphylaxis  . Azithromycin Hives and Rash  . Bactrim (Sulfamethoxazole W-Trimethoprim) Hives and Rash  . Sulfa Antibiotics Hives and Rash    Patient Measurements: Height: 5\' 6"  (167.6 cm) Weight: 325 lb 2.9 oz (147.5 kg) IBW/kg (Calculated) : 59.3  Heparin Dosing Weight: 96kg  Vital Signs: Temp: 97.9 F (36.6 C) (12/28 0400) Temp src: Oral (12/28 0400) BP: 126/78 mmHg (12/28 0400) Pulse Rate: 101  (12/28 0400)  Labs:  Basename 03/27/12 0615 03/26/12 0630 03/26/12 0008  HGB 9.2* 8.8* --  HCT 33.6* 29.6* --  PLT 158 166 --  APTT -- -- --  LABPROT 20.2* 26.0* --  INR 1.79* 2.52* --  HEPARINUNFRC -- -- --  CREATININE 1.35* 1.55* 1.68*  CKTOTAL -- -- --  CKMB -- -- --  TROPONINI -- <0.30 <0.30    Estimated Creatinine Clearance: 61.2 ml/min (by C-G formula based on Cr of 1.35).   Medical History: History reviewed. No pertinent past medical history.  Medications:  Scheduled:    . aspirin EC  81 mg Oral Daily  . atorvastatin  10 mg Oral q1800  . budesonide  0.5 mg Nebulization BID  . Chlorhexidine Gluconate Cloth  6 each Topical Q0600  . digoxin  0.125 mg Oral Daily  . insulin aspart  0-15 Units Subcutaneous TID WC  . [COMPLETED] lidocaine  1 patch Transdermal q morning - 10a  . [COMPLETED] magnesium hydroxide  30 mL Oral Once  . [COMPLETED] metoprolol  10 mg Intravenous Once  . metoprolol tartrate  50 mg Oral Q6H  . [COMPLETED] metoprolol tartrate  25 mg Oral Once  . montelukast  10 mg Oral QHS  . mupirocin ointment  1 application Nasal BID  . pantoprazole  40 mg Oral Daily  . polyethylene glycol  17 g Oral Daily  . rOPINIRole  1 mg Oral BID  . sodium chloride  3 mL Intravenous Q12H  . traZODone  150 mg Oral QHS  . [DISCONTINUED] furosemide  40 mg Oral Daily  . [DISCONTINUED] metoprolol  tartrate  12.5 mg Oral Q6H  . [DISCONTINUED] metoprolol tartrate  25 mg Oral Q6H    Assessment: 66 yoF transferred from [facility] 12/26. PMH significant for known CAD s/p PCI, CHF (EF 20%), with recent CVA and new-onset AFib. Patient also with c/o chest pain x 6 weeks PTA. Troponins all negative at [facility] with report of one troponin at 0.19 while at [facility].   INR in therapeutic range at 2.52 on admit to [facility]. Coumadin for AFib started at [facility] has been held, and INR has come down to 1.79. Patient is now to start on heparin with RHC and LHC the first of this week. Possible TEE/DCCV in near future to be done after cath completed.  SCr 1.35 with CrCl~ 60 ml/min, baseline unknown. UOP low at 0.3 ml/kg/hr Noted patient has been agitated, combative, and pulling at IV lines. Patient had to be restrained some overnight.  Hgb 9.2, plts 158, all low and stable from yesterday  Goal of Therapy:  Heparin level 0.3-0.7 units/ml Monitor platelets by anticoagulation protocol: Yes   Plan:  -Heparin bolus of 4000 units x 1 -Heparin gtt at 1250 units/hr -8 hour heparin level -daily heparin level and CBC -Monitor for s/sx bleeding -D/c daily PT/INR, will resume lab monitoring when Coumadin is resumed   Thank you for the  consult.  Tomi Bamberger, PharmD Clinical Pharmacist Pager: [phone] Pharmacy: [phone] 03/27/2012 7:55 AM    Addendum: 1300 Pt pulled out heparin line and IV access could not be acquired by RN or IV access team. Pt to be changed to full-dose Lovenox (pharmacy to dose) per cards for AFib. Renal function and CBC as above.  Plan: -Lovenox 145mg   q12 hours (1mg /kg) -D/c daily heparin levels -CBC at least q72hrs -F/u renal function -Monitor for s/sx bleeding  Tomi Bamberger, PharmD Clinical Pharmacist Pager: [phone] Pharmacy: [phone] 03/27/2012 1:16 PM '

## 2012-03-27 NOTE — Progress Notes (Signed)
IV was lost, pt pulled out IV due to confusion. Pt was started on a heparin drip at 0905. Two nurses attempted to place new IV but was unsuccessful. IV team was notified, pt placed on list to be seen. Pharmacy was also notified of lose of IV access.

## 2012-03-27 NOTE — Progress Notes (Signed)
[facility] PROGRESS NOTE  Caitlyn Hardy ZOX:[id number] DOB: [birth year] DOA: 03/25/2012 PCP: No primary provider on file.  HPI/Subjective: Was confused last night, likely secondary to sundowning. Awake, alert and oriented this morning.   Assessment/Plan:  Acute on chronic systolic CHF -Left ventricle ejection fraction is 20% checked on 03/23/2012 an outside hospital, LVEF was 40% in 2011. -Surprisingly patient is not short of breath, not overtly fluid overloaded, cardiology following. -Patient is on metoprolol 25 mg every 6 hours, digoxin. -Was on Lasix this is been on hold because of marginal renal function.  Atrial fibrillation with RVR -Rate is controlled, she is on metoprolol and digoxin, cardiology following. -patient is on Coumadin, INR is 2.5. Pharmacy to dose the Coumadin.  Renal failure -Assumed acute renal failure, no records in the hospital here. -Presented with creatinine of 1.68, creatinine today is 1.35. Followup BMP closely. -Off of the Lasix, creatinine is improving.  Altered mental status -Per family this happen towards night time, she is awake alert oriented x3 now. -It could be secondary to sundowning, we will try to avoid narcotics, benzos and hypnotics.  Recent CVA -Await the complete report from [facility], unsure if it's cerebellar or basal ganglia stroke.  Diabetes mellitus type 2 -Carbohydrate modified diet, insulin sliding scale and will check hemoglobin A1c.  Code Status: Full code Family Communication:  Disposition Plan: remains inpatient   Consultants:  [place] cardiology  Procedures:  none  Antibiotics:  none  Objective: Filed Vitals:   03/26/12 2302 03/27/12 0000 03/27/12 0400 03/27/12 0748  BP:  118/76 126/78 110/79  Pulse: 114 91 101 92  Temp:  98.5 F (36.9 C) 97.9 F (36.6 C) 97.8 F (36.6 C)  TempSrc:  Oral Oral Oral  Resp:  19 19 21   Height:      Weight:      SpO2:  98% 95% 98%    Intake/Output  Summary (Last 24 hours) at 03/27/12 1009 Last data filed at 03/27/12 0905  Gross per 24 hour  Intake    600 ml  Output   1050 ml  Net   -450 ml   Filed Weights   03/25/12 2230  Weight: 147.5 kg (325 lb 2.9 oz)    Exam:  General: Alert and awake, oriented x3, not in any acute distress. HEENT: anicteric sclera, pupils reactive to light and accommodation, EOMI CVS: S1-S2 clear, no murmur rubs or gallops Chest: clear to auscultation bilaterally, no wheezing, rales or rhonchi Abdomen: soft nontender, nondistended, normal bowel sounds, no organomegaly Extremities: no cyanosis, clubbing or edema noted bilaterally Neuro: Cranial nerves II-XII intact, no focal neurological deficits  Data Reviewed: Basic Metabolic Panel:  Lab 03/27/12 8119 03/26/12 0630 03/26/12 0008  NA 138 138 137  K 5.0 4.7 5.0  CL 106 106 105  CO2 22 20 20   GLUCOSE 118* 120* 165*  BUN 55* 65* 60*  CREATININE 1.35* 1.55* 1.68*  CALCIUM 9.1 8.9 8.8  MG -- -- 2.6*  PHOS -- -- 2.8   Liver Function Tests:  Lab 03/26/12 0008  AST 22  ALT 44*  ALKPHOS 77  BILITOT 0.5  PROT 5.9*  ALBUMIN 3.5   No results found for this basename: LIPASE:5,AMYLASE:5 in the last 168 hours No results found for this basename: AMMONIA:5 in the last 168 hours CBC:  Lab 03/27/12 0615 03/26/12 0630  WBC 8.7 10.4  NEUTROABS -- 8.3*  HGB 9.2* 8.8*  HCT 33.6* 29.6*  MCV 73.2* 72.5*  PLT 158 166   Cardiac Enzymes:  Lab 03/26/12 0630 03/26/12 0008  CKTOTAL -- --  CKMB -- --  CKMBINDEX -- --  TROPONINI <0.30 <0.30   BNP (last 3 results)  Basename 03/26/12 0008  PROBNP 10380.0*   CBG:  Lab 03/27/12 0738 03/26/12 2234 03/26/12 1724 03/26/12 1250 03/26/12 0820  GLUCAP 129* 102* 130* 142* 110*    Recent Results (from the past 240 hour(s))  MRSA PCR SCREENING     Status: Abnormal   Collection Time   03/25/12 11:28 PM      Component Value Range Status Comment   MRSA by PCR POSITIVE (*) NEGATIVE Final       Studies: Portable Chest 1 View  03/26/2012  *RADIOLOGY REPORT*  Clinical Data: CHF  PORTABLE CHEST - 1 VIEW  Comparison: None.  Findings: Previous CABG.  Mild cardiomegaly.  Relatively low lung volumes with resultant crowding of perihilar bronchovascular structures.  Cannot exclude mild central pulmonary vascular congestion.  No effusion.  IMPRESSION:  1.  Cardiomegaly with question of mild central pulmonary vascular congestion.   Original Report Authenticated By: D. Andria Rhein, MD     Scheduled Meds:    . aspirin EC  81 mg Oral Daily  . atorvastatin  10 mg Oral q1800  . budesonide  0.5 mg Nebulization BID  . Chlorhexidine Gluconate Cloth  6 each Topical Q0600  . digoxin  0.125 mg Oral Daily  . insulin aspart  0-15 Units Subcutaneous TID WC  . metoprolol tartrate  50 mg Oral Q6H  . montelukast  10 mg Oral QHS  . mupirocin ointment  1 application Nasal BID  . pantoprazole  40 mg Oral Daily  . polyethylene glycol  17 g Oral Daily  . rOPINIRole  1 mg Oral BID  . sodium chloride  3 mL Intravenous Q12H  . traZODone  150 mg Oral QHS   Continuous Infusions:    . heparin 1,250 Units/hr (03/27/12 0905)    Principal Problem:  *Atrial fibrillation with RVR Active Problems:  CVA (cerebral infarction)  CHF (congestive heart failure)  CAD (coronary artery disease)  Renal failure  Acute on chronic systolic heart failure  Anemia    Time spent: 35 minutes    [facility] A  [facility] Pager [phone]. If 8PM-8AM, please contact night-coverage at [email or web address], password [facility] 03/27/2012, 10:09 AM  LOS: 2 days

## 2012-03-27 NOTE — Progress Notes (Signed)
SUBJECTIVE: The patient is stable today. She remains very confused.  At this time, she denies chest pain, shortness of breath, or any new concerns.  Her son from [place] is at the bedside.  We had a long discussion about her guarded prognosis and overall clinical state.     Marland Kitchen aspirin EC  81 mg Oral Daily  . atorvastatin  10 mg Oral q1800  . budesonide  0.5 mg Nebulization BID  . Chlorhexidine Gluconate Cloth  6 each Topical Q0600  . digoxin  0.125 mg Oral Daily  . insulin aspart  0-15 Units Subcutaneous TID WC  . metoprolol tartrate  75 mg Oral Q6H  . montelukast  10 mg Oral QHS  . mupirocin ointment  1 application Nasal BID  . pantoprazole  40 mg Oral Daily  . polyethylene glycol  17 g Oral Daily  . rOPINIRole  1 mg Oral BID  . sodium chloride  3 mL Intravenous Q12H  . traZODone  150 mg Oral QHS      . heparin 1,250 Units/hr (03/27/12 0905)    OBJECTIVE: Physical Exam: Filed Vitals:   03/26/12 2302 03/27/12 0000 03/27/12 0400 03/27/12 0748  BP:  118/76 126/78 110/79  Pulse: 114 91 101 92  Temp:  98.5 F (36.9 C) 97.9 F (36.6 C) 97.8 F (36.6 C)  TempSrc:  Oral Oral Oral  Resp:  19 19 21   Height:      Weight:      SpO2:  98% 95% 98%    Intake/Output Summary (Last 24 hours) at 03/27/12 1103 Last data filed at 03/27/12 1610  Gross per 24 hour  Intake    600 ml  Output   1200 ml  Net   -600 ml    Telemetry reveals afib with elevated ventricular rates  GEN- The patient is obese appearing, alert but confused,  Very chronically ill appearing Head- normocephalic, atraumatic Eyes-  Sclera clear, conjunctiva pink Ears- hearing intact Oropharynx- clear Neck- supple, L EJ is quite firm and ecchymotic at a site of a venous stick,  Diffuse but significant bruising around her neck anteriorly Lungs- Clear to ausculation bilaterally, normal work of breathing Heart- irregular rate and rhythm,  GI- soft, NT, ND, + BS Extremities- no clubbing, cyanosis, dependant  edema Skin- ecchymosis diffusely Psych- confused Neuro- moves all extremities  LABS: Basic Metabolic Panel:  Basename 03/27/12 0615 03/26/12 0630 03/26/12 0008  NA 138 138 --  K 5.0 4.7 --  CL 106 106 --  CO2 22 20 --  GLUCOSE 118* 120* --  BUN 55* 65* --  CREATININE 1.35* 1.55* --  CALCIUM 9.1 8.9 --  MG -- -- 2.6*  PHOS -- -- 2.8   Liver Function Tests:  Basename 03/26/12 0008  AST 22  ALT 44*  ALKPHOS 77  BILITOT 0.5  PROT 5.9*  ALBUMIN 3.5   No results found for this basename: LIPASE:2,AMYLASE:2 in the last 72 hours CBC:  Basename 03/27/12 0615 03/26/12 0630  WBC 8.7 10.4  NEUTROABS -- 8.3*  HGB 9.2* 8.8*  HCT 33.6* 29.6*  MCV 73.2* 72.5*  PLT 158 166   Cardiac Enzymes:  Basename 03/26/12 0630 03/26/12 0008  CKTOTAL -- --  CKMB -- --  CKMBINDEX -- --  TROPONINI <0.30 <0.30   RADIOLOGY: Portable Chest 1 View  03/26/2012  *RADIOLOGY REPORT*  Clinical Data: CHF  PORTABLE CHEST - 1 VIEW  Comparison: None.  Findings: Previous CABG.  Mild cardiomegaly.  Relatively low lung volumes with  resultant crowding of perihilar bronchovascular structures.  Cannot exclude mild central pulmonary vascular congestion.  No effusion.  IMPRESSION:  1.  Cardiomegaly with question of mild central pulmonary vascular congestion.   Original Report Authenticated By: D. Andria Rhein, MD     ASSESSMENT AND PLAN:  Principal Problem:  *Atrial fibrillation with RVR Active Problems:  CVA (cerebral infarction)  CHF (congestive heart failure)  CAD (coronary artery disease)  Renal failure  Acute on chronic systolic heart failure  Anemia  PLAN:  1. Acute on chronic systolic HF - EF is 25% by echo here with significant LV enlargement suggesting that this may chronic  RHC and LHC early next week Holding ACE inhibitor due to renal failure but hope to add if creatinine remains improved RHC will be helpful to guide volume management Renal function has improved off of lasix  2. Afib- V  rates elevated Increase metoprolol to 75mg  Q6 hours today Heparin gtt per pharmacy for INR <2.  Restart coumadin post cath.  Would plan TEE/DCCV and initiation of antiarrhythmic medicine later next week (depending on cath results).   3. Acute on chronic renal failure Appears intravascularly dry with extravascular fluid Would hold lasix for now and follow creatinine  4. Microcytic anemia Workup per primary team  5. AMS - work up and treatment per primary team   6. Recent CVA Continue long term anticoagulation Hold coumadin and bridge with heparin for cath  Code status - presently full code  Hillis Range, MD 03/27/2012 11:03 AM

## 2012-03-27 NOTE — Progress Notes (Addendum)
Pt very confused, refusing all 2200 and 0000 meds.  MD on call notified- lopressor changed to IV for one dose.  Pt also pulling at lines- safety mitts placed on pt.  Will continue to monitor.    Maximino Greenland RN  0030 Pt pulled off mitts, removing lines, oxygen, and gown.  Pt confused, agitated, and very aggressive toward staff.  MD on call notified- bilateral wrist restraints ordered and placed on pt.  Will continue to monitor.    0530 Pt awake, alert, oriented, and cooperative.  Discussed why restraints were necessary earlier and pt was understanding.  Removed restraints per d/c criteria.  Will continue to monitor.    Maximino Greenland RN

## 2012-03-28 ENCOUNTER — Inpatient Hospital Stay (HOSPITAL_COMMUNITY): Payer: Medicare Other

## 2012-03-28 DIAGNOSIS — N179 Acute kidney failure, unspecified: Secondary | ICD-10-CM

## 2012-03-28 DIAGNOSIS — J189 Pneumonia, unspecified organism: Secondary | ICD-10-CM

## 2012-03-28 DIAGNOSIS — I5043 Acute on chronic combined systolic (congestive) and diastolic (congestive) heart failure: Secondary | ICD-10-CM

## 2012-03-28 LAB — TROPONIN I: Troponin I: 3.97 ng/mL (ref <0.30)

## 2012-03-28 LAB — GLUCOSE, CAPILLARY: Glucose-Capillary: 125 mg/dL — ABNORMAL HIGH (ref 70–99)

## 2012-03-28 LAB — BLOOD GAS, ARTERIAL
TCO2: 18.9 mmol/L (ref 0–100)
pCO2 arterial: 31.6 mmHg — ABNORMAL LOW (ref 35.0–45.0)
pH, Arterial: 7.372 (ref 7.350–7.450)

## 2012-03-28 LAB — BASIC METABOLIC PANEL
BUN: 54 mg/dL — ABNORMAL HIGH (ref 6–23)
BUN: 50 mg/dL — ABNORMAL HIGH (ref 6–23)
CO2: 19 mEq/L (ref 19–32)
Calcium: 8.9 mg/dL (ref 8.4–10.5)
Creatinine, Ser: 1.35 mg/dL — ABNORMAL HIGH (ref 0.50–1.10)
Creatinine, Ser: 1.18 mg/dL — ABNORMAL HIGH (ref 0.50–1.10)
GFR calc Af Amer: 54 mL/min — ABNORMAL LOW (ref >90)
GFR calc non Af Amer: 47 mL/min — ABNORMAL LOW (ref >90)
Glucose, Bld: 116 mg/dL — ABNORMAL HIGH (ref 70–99)

## 2012-03-28 LAB — EXPECTORATED SPUTUM ASSESSMENT W GRAM STAIN, RFLX TO RESP C

## 2012-03-28 LAB — STREP PNEUMONIAE URINARY ANTIGEN: Strep Pneumo Urinary Antigen: NEGATIVE

## 2012-03-28 LAB — CBC
MCH: 21.7 pg — ABNORMAL LOW (ref 26.0–34.0)
MCHC: 29.9 g/dL — ABNORMAL LOW (ref 30.0–36.0)
MCV: 72.6 fL — ABNORMAL LOW (ref 78.0–100.0)
Platelets: 166 10*3/uL (ref 150–400)
RBC: 4.89 MIL/uL (ref 3.87–5.11)
RDW: 19.4 % — ABNORMAL HIGH (ref 11.5–15.5)

## 2012-03-28 LAB — LEGIONELLA ANTIGEN, URINE: Legionella Antigen, Urine: NEGATIVE

## 2012-03-28 MED ORDER — LEVOFLOXACIN IN D5W 750 MG/150ML IV SOLN
750.0000 mg | Freq: Every day | INTRAVENOUS | Status: AC
  Administered 2012-03-28 – 2012-03-29 (×3): 750 mg via INTRAVENOUS
  Filled 2012-03-28 (×3): qty 150

## 2012-03-28 MED ORDER — LEVALBUTEROL HCL 0.63 MG/3ML IN NEBU
0.6300 mg | INHALATION_SOLUTION | Freq: Four times a day (QID) | RESPIRATORY_TRACT | Status: DC | PRN
  Administered 2012-03-29 (×2): 0.63 mg via RESPIRATORY_TRACT
  Filled 2012-03-28 (×2): qty 3

## 2012-03-28 MED ORDER — METOPROLOL TARTRATE 1 MG/ML IV SOLN
2.5000 mg | Freq: Once | INTRAVENOUS | Status: AC
  Administered 2012-03-28: 2.5 mg via INTRAVENOUS

## 2012-03-28 MED ORDER — ENOXAPARIN SODIUM 150 MG/ML ~~LOC~~ SOLN
1.0000 mg/kg | Freq: Two times a day (BID) | SUBCUTANEOUS | Status: DC
  Administered 2012-03-28 – 2012-03-29 (×3): 145 mg via SUBCUTANEOUS
  Filled 2012-03-28 (×7): qty 1

## 2012-03-28 MED ORDER — DEXTROSE 5 % IV SOLN
2.0000 g | Freq: Three times a day (TID) | INTRAVENOUS | Status: DC
  Administered 2012-03-28 – 2012-03-31 (×10): 2 g via INTRAVENOUS
  Filled 2012-03-28 (×12): qty 2

## 2012-03-28 MED ORDER — SODIUM CHLORIDE 0.9 % IJ SOLN: 3.0000 mL | INTRAMUSCULAR | Status: DC | PRN

## 2012-03-28 MED ORDER — SODIUM CHLORIDE 0.9 % IJ SOLN
3.0000 mL | Freq: Two times a day (BID) | INTRAMUSCULAR | Status: DC
  Administered 2012-03-30: 3 mL via INTRAVENOUS

## 2012-03-28 MED ORDER — VANCOMYCIN HCL IN DEXTROSE 1-5 GM/200ML-% IV SOLN
1000.0000 mg | Freq: Two times a day (BID) | INTRAVENOUS | Status: DC
  Administered 2012-03-28 – 2012-03-29 (×2): 1000 mg via INTRAVENOUS
  Filled 2012-03-28 (×3): qty 200

## 2012-03-28 MED ORDER — SODIUM CHLORIDE 0.9 % IV SOLN: 250.0000 mL | INTRAVENOUS | Status: DC | PRN

## 2012-03-28 MED ORDER — VANCOMYCIN HCL 10 G IV SOLR
2000.0000 mg | Freq: Once | INTRAVENOUS | Status: AC
  Administered 2012-03-28: 2000 mg via INTRAVENOUS
  Filled 2012-03-28: qty 2000

## 2012-03-28 MED ORDER — ASPIRIN 81 MG PO CHEW
324.0000 mg | CHEWABLE_TABLET | ORAL | Status: AC
  Administered 2012-03-29: 324 mg via ORAL
  Filled 2012-03-28: qty 4

## 2012-03-28 MED ORDER — FUROSEMIDE 10 MG/ML IJ SOLN
40.0000 mg | Freq: Once | INTRAMUSCULAR | Status: AC
  Administered 2012-03-28: 40 mg via INTRAVENOUS
  Filled 2012-03-28: qty 4

## 2012-03-28 MED ORDER — METOPROLOL TARTRATE 1 MG/ML IV SOLN
INTRAVENOUS | Status: AC
  Filled 2012-03-28: qty 5

## 2012-03-28 NOTE — Progress Notes (Signed)
CRITICAL VALUE ALERT  Critical value received:  Troponin 3.97 Date of notification:  03/28/2012  Time of notification:  0300  Critical value read back:yes  Nurse who received alert:  Vivi Martens Rn   MD notified (1st page):  Claiborne Lavender Stanke   Time of first page:  0301  MD notified (2nd page):  Time of second page:  Responding MD:  Claiborne Jakeem Grape   Time MD responded:  [phone]  Claiborne Frederich Montilla stated he would call Cardiology

## 2012-03-28 NOTE — Progress Notes (Signed)
[facility] progress note. Chief complaint. Hypoxia. History of present illness. This 66 year old female admitted with acute on chronic congestive heart failure, atrial fib with RVR, renal failure, altered mental status, etc. Patient was noted by staff to be more confused this p.m. with pulse oximetry reading in the upper 70s on 2 L of nasal cannula oxygen. I was notified and came to see the patient with rapid response as well. Nursing now describes an incident with the patient lethargic and more confused, pale and clammy, O2 sats reading in the 70s. She was placed on Venturi mask with little improvement. She was then placed on full facemask with improvement in her oxygen saturation to the mid 90s. An arterial blood gas was done on facemask oxygen pH 7.372, PCO2 31.6, PO2 216, bicarbonate 17.9. A portable chest x-ray was also obtained but this result is pending. During the course of the interview patient may a vague reference to some chest pain she experienced earlier. Clarify this and she stated she was not having any current chest pain. Nonetheless, a 12-lead EKG was obtained and this does not appear significantly different from prior EKG and was not suggestive of acute ischemia. Vital signs. Temperature 97.9, pulse 105, respiration 18, blood pressure 127/74. O2 sat on facemask oxygen 95%. General appearance. Obese middle-aged female who is alert no confused. He is only oriented to self. Cardiac. Irregularly irregular. No jugular venous distention. Does have some mild peripheral edema. Lungs. Breath sounds are clear and equal bilaterally. Abdomen. Soft and obese with positive bowel sounds. No pain. Impression/plan. Problem #1. Hypoxia. I suspect patient's presentation is multifactorial. Pulse oximetry probably has not reading accurately. This given the high PO2 per ABG. Confusion and lethargy may be due to degree of obesity sleep apnea/obesity hypoventilation. We will transition the patient back  to nasal cannula oxygen and follow for chest x-ray results. Problem #2 Chest pain? Patient did make a vague reference to chest pain earlier in the day and her bedside 12-lead EKG looks unchanged and not suggestive of ischemia. Nonetheless I will cycle 3 sets of troponin and follow for these results.  Addendum: The patient chest x-ray has resulted  indicating that lungs were hypoexpanded. Patchy bilateral air space opacities raise concern for pneumonia. Cardiomegaly. Given these findings I have added labs and antibiotics as per pneumonia orders set for hospital-acquired pneumonia. Given tachycardia I've discontinued albuterol and started Xopenex when necessary nebulizers.  Patient's first troponin has also resulted and is elevated 3.97. Patient had no complaints of chest pain a when I saw her at bedside. She had vague complaints of chest pain from earlier in the evening. I discussed the case with Dr. Mayford Knife cardiologist on-call. Given lack of chest pain and the fact that the patient is anticoagulated on Lovenox there were no new interventions recommended. A repeat EKG is ordered for later this a.m. 2 more sets of troponin are also pending. Cardiology will see the patient later this a.m.

## 2012-03-28 NOTE — Progress Notes (Signed)
[facility] PROGRESS NOTE  Caitlyn Hardy ZOX:[id number] DOB: 01-16-1946 DOA: 03/25/2012 PCP: No primary provider on file.  HPI/Subjective: Very confused last night, she pulled her IV lines. Rapid response was called last night because of O2 sats were in the 70s. Apparently there was no acute waveforms, patient placed back on 2 L after the ABG showed PO2 of 216 Denies any chest pain today, her troponin last night was 3.9   Assessment/Plan:  Acute on chronic systolic CHF -Left ventricle ejection fraction is 20% checked on 03/23/2012 an outside hospital, LVEF was 40% in 2011. -Surprisingly patient is not short of breath, not overtly fluid overloaded, cardiology following. -Patient is on metoprolol 25 mg every 6 hours, digoxin. -Elevated troponin at 3.9, patient is on heparin (on bridge because of subtherapeutic INR), cards to advise  Atrial fibrillation with RVR -Rate is controlled, she is on metoprolol and digoxin, cardiology following. -patient is on Coumadin, INR is 2.5. Pharmacy to dose the Coumadin.  Pneumonia -Chest x-ray concerning for pneumonia. -Had leukocytosis and shortness of breath. Antibiotics started for pneumonia.  Renal failure -Assumed acute renal failure, no records in the hospital here. -Presented with creatinine of 1.68, creatinine today is 1.35. Followup BMP closely. -Off of the Lasix, creatinine is improving.  Altered mental status -Per family this happen towards night time, she is awake alert oriented x3 now. -It could be secondary to sundowning, we will try to avoid narcotics, benzos and hypnotics.  Recent CVA -Await the complete report from [facility], unsure if it's cerebellar or basal ganglia stroke.  Diabetes mellitus type 2 -Carbohydrate modified diet, insulin sliding scale and will check hemoglobin A1c.  Code Status: Full code Family Communication:  Disposition Plan: remains inpatient   Consultants:  [facility]  card[facility]  Procedures:  none  Antibiotics:  none  Objective: Filed Vitals:   03/28/12 0441 03/28/12 0612 03/28/12 0819 03/28/12 0910  BP: 127/81 142/76 118/78   Pulse: 118 111 69   Temp: 98.1 F (36.7 C)  98.9 F (37.2 C)   TempSrc: Oral  Oral   Resp: 16  18   Height:      Weight: 154.3 kg (340 lb 2.7 oz)     SpO2: 92%  97% 96%    Intake/Output Summary (Last 24 hours) at 03/28/12 8119 Last data filed at 03/28/12 0800  Gross per 24 hour  Intake  617.5 ml  Output    950 ml  Net -332.5 ml   Filed Weights   03/25/12 2230 03/28/12 0441  Weight: 147.5 kg (325 lb 2.9 oz) 154.3 kg (340 lb 2.7 oz)    Exam:  General: Alert and awake, oriented x3, not in any acute distress. HEENT: anicteric sclera, pupils reactive to light and accommodation, EOMI CVS: S1-S2 clear, no murmur rubs or gallops Chest: clear to auscultation bilaterally, no wheezing, rales or rhonchi Abdomen: soft nontender, nondistended, normal bowel sounds, no organomegaly Extremities: no cyanosis, clubbing or edema noted bilaterally Neuro: Cranial nerves II-XII intact, no focal neurological deficits  Data Reviewed: Basic Metabolic Panel:  Lab 03/28/12 1478 03/27/12 0615 03/26/12 0630 03/26/12 0008  NA 136 138 138 137  K 5.7* 5.0 4.7 5.0  CL 104 106 106 105  CO2 19 22 20 20   GLUCOSE 116* 118* 120* 165*  BUN 54* 55* 65* 60*  CREATININE 1.35* 1.35* 1.55* 1.68*  CALCIUM 8.9 9.1 8.9 8.8  MG -- -- -- 2.6*  PHOS -- -- -- 2.8   Liver Function Tests:  Lab 03/26/12 0008  AST 22  ALT 44*  ALKPHOS 77  BILITOT 0.5  PROT 5.9*  ALBUMIN 3.5   No results found for this basename: LIPASE:5,AMYLASE:5 in the last 168 hours No results found for this basename: AMMONIA:5 in the last 168 hours CBC:  Lab 03/28/12 0205 03/27/12 0615 03/26/12 0630  WBC 11.7* 8.7 10.4  NEUTROABS -- -- 8.3*  HGB 10.6* 9.2* 8.8*  HCT 35.5* 33.6* 29.6*  MCV 72.6* 73.2* 72.5*  PLT 166 158 166   Cardiac Enzymes:  Lab 03/28/12  0203 03/26/12 0630 03/26/12 0008  CKTOTAL -- -- --  CKMB -- -- --  CKMBINDEX -- -- --  TROPONINI 3.97* <0.30 <0.30   BNP (last 3 results)  Basename 03/26/12 0008  PROBNP 10380.0*   CBG:  Lab 03/28/12 0109 03/27/12 2225 03/27/12 1736 03/27/12 1125 03/27/12 0738  GLUCAP 125* 98 120* 118* 129*    Recent Results (from the past 240 hour(s))  MRSA PCR SCREENING     Status: Abnormal   Collection Time   03/25/12 11:28 PM      Component Value Range Status Comment   MRSA by PCR POSITIVE (*) NEGATIVE Final   URINE CULTURE     Status: Normal   Collection Time   03/26/12  1:00 AM      Component Value Range Status Comment   Specimen Description URINE, RANDOM   Final    Special Requests NONE   Final    Culture  Setup Time 03/26/2012 08:34   Final    Colony Count NO GROWTH   Final    Culture NO GROWTH   Final    Report Status 03/27/2012 FINAL   Final      Studies: Dg Chest Port 1 View  03/28/2012  *RADIOLOGY REPORT*  Clinical Data: Hypoxia.  PORTABLE CHEST - 1 VIEW  Comparison: Chest radiograph performed 03/26/2012  Findings: The lungs are hypoexpanded.  Patchy bilateral airspace opacities raise concern for pneumonia.  No definite pleural effusion or pneumothorax is seen.  The cardiomediastinal silhouette is enlarged; the patient is status post median sternotomy, with evidence of prior CABG.  No acute osseous abnormalities are seen.  IMPRESSION:  1.  Lungs hypoexpanded.  Patchy bilateral airspace opacities raise concern for pneumonia. 2.  Cardiomegaly.  These results were called by telephone on 03/28/2012 at 02:01 a.m. to [facility] on [phone], who verbally acknowledged these results.   Original Report Authenticated By: Tonia Ghent, M.D.     Scheduled Meds:    . aspirin EC  81 mg Oral Daily  . atorvastatin  10 mg Oral q1800  . aztreonam  2 g Intravenous Q8H  . budesonide  0.5 mg Nebulization BID  . Chlorhexidine Gluconate Cloth  6 each Topical Q0600  . digoxin  0.125 mg Oral Daily   . enoxaparin (LOVENOX) injection  1 mg/kg Subcutaneous BID  . insulin aspart  0-15 Units Subcutaneous TID WC  . levofloxacin (LEVAQUIN) IV  750 mg Intravenous QHS  . metoprolol      . metoprolol tartrate  75 mg Oral Q6H  . montelukast  10 mg Oral QHS  . mupirocin ointment  1 application Nasal BID  . pantoprazole  40 mg Oral Daily  . polyethylene glycol  17 g Oral Daily  . rOPINIRole  1 mg Oral BID  . sodium chloride  3 mL Intravenous Q12H  . traZODone  150 mg Oral QHS  . vancomycin  1,000 mg Intravenous Q12H   Continuous Infusions:    Principal Problem:  *  Atrial fibrillation with RVR Active Problems:  CVA (cerebral infarction)  CHF (congestive heart failure)  CAD (coronary artery disease)  Renal failure  Acute on chronic systolic heart failure  Anemia    Time spent: 35 minutes    [facility]  [facility] Pager [phone]. If 8PM-8AM, please contact night-coverage at [email or web address], password [facility] 03/28/2012, 9:22 AM  LOS: 3 days

## 2012-03-28 NOTE — Progress Notes (Signed)
ANTIBIOTIC CONSULT NOTE - INITIAL  Pharmacy Consult for Vancomycin Indication: rule out pneumonia  Allergies  Allergen Reactions  . Penicillins Anaphylaxis  . Azithromycin Hives and Rash  . Bactrim (Sulfamethoxazole W-Trimethoprim) Hives and Rash  . Sulfa Antibiotics Hives and Rash    Patient Measurements: Height: 5\' 6"  (167.6 cm) Weight: 325 lb 2.9 oz (147.5 kg) IBW/kg (Calculated) : 59.3  Adjusted Body Weight: 95 kg  Vital Signs: Temp: 97.9 F (36.6 C) (12/28 2354) Temp src: Oral (12/28 2354) BP: 127/74 mmHg (12/28 2354) Pulse Rate: 105  (12/28 2354) Intake/Output from previous day: 12/28 0701 - 12/29 0700 In: 510 [P.O.:360; I.V.:150] Out: 850 [Urine:850] Intake/Output from this shift: Total I/O In: 0  Out: 300 [Urine:300]  Labs:  [facility] 03/27/12 0615 03/26/12 0630 03/26/12 0008  WBC 8.7 10.4 --  HGB 9.2* 8.8* --  PLT 158 166 --  LABCREA -- -- --  CREATININE 1.35* 1.55* 1.68*   Estimated Creatinine Clearance: 61.2 ml/min (by C-G formula based on Cr of 1.35). No results found for this basename: VANCOTROUGH:2,VANCOPEAK:2,VANCORANDOM:2,GENTTROUGH:2,GENTPEAK:2,GENTRANDOM:2,TOBRATROUGH:2,TOBRAPEAK:2,TOBRARND:2,AMIKACINPEAK:2,AMIKACINTROU:2,AMIKACIN:2, in the last 72 hours   Microbiology: Recent Results (from the past 720 hour(s))  MRSA PCR SCREENING     Status: Abnormal   Collection Time   03/25/12 11:28 PM      Component Value Range Status Comment   MRSA by PCR POSITIVE (*) NEGATIVE Final   URINE CULTURE     Status: Normal   Collection Time   03/26/12  1:00 AM      Component Value Range Status Comment   Specimen Description URINE, RANDOM   Final    Special Requests NONE   Final    Culture  Setup Time 03/26/2012 08:34   Final    Colony Count NO GROWTH   Final    Culture NO GROWTH   Final    Report Status 03/27/2012 FINAL   Final    Assessment: 66 yo female with hypoxia, possible PNA for empiric antibiotics  Goal of Therapy:  Vancomycin trough  15-20  Plan:  Vancomycin 2 g IV now, then 1 g IV q12h F/U renal function  Aahana Elza, Gary Fleet 03/28/2012,2:19 AM

## 2012-03-28 NOTE — Progress Notes (Signed)
Pharmacist Heart Failure Core Measure Documentation  Assessment: Caitlyn Hardy has an EF documented as 25% on 03/26/3012 by Dr. Myrtis Ser.  Rationale: Heart failure patients with left ventricular systolic dysfunction (LVSD) and an EF < 40% should be prescribed an angiotensin converting enzyme inhibitor (ACEI) or angiotensin receptor blocker (ARB) at discharge unless a contraindication is documented in the medical record.  This patient is not currently on an ACEI or ARB for HF.  This note is being placed in the record in order to provide documentation that a contraindication to the use of these agents is present for this encounter.  ACE Inhibitor or Angiotensin Receptor Blocker is contraindicated (specify all that apply)  []   ACEI allergy AND ARB allergy []   Angioedema []   Moderate or severe aortic stenosis [x]   Hyperkalemia []   Hypotension []   Renal artery stenosis []   Worsening renal function, preexisting renal disease or dysfunction   Laurence Slate 03/28/2012 9:54 AM

## 2012-03-28 NOTE — Progress Notes (Signed)
SUBJECTIVE: The patient is stable today. Confusion is better.  She has had some SOB.  At this time, she denies chest pain or any new concerns.  Her sons are both at the bedside.  We had a long discussion about her guarded prognosis and overall clinical state.     Marland Kitchen aspirin EC  81 mg Oral Daily  . atorvastatin  10 mg Oral q1800  . aztreonam  2 g Intravenous Q8H  . budesonide  0.5 mg Nebulization BID  . Chlorhexidine Gluconate Cloth  6 each Topical Q0600  . digoxin  0.125 mg Oral Daily  . enoxaparin (LOVENOX) injection  1 mg/kg Subcutaneous BID  . furosemide  40 mg Intravenous Once  . insulin aspart  0-15 Units Subcutaneous TID WC  . levofloxacin (LEVAQUIN) IV  750 mg Intravenous QHS  . metoprolol      . metoprolol tartrate  75 mg Oral Q6H  . montelukast  10 mg Oral QHS  . mupirocin ointment  1 application Nasal BID  . pantoprazole  40 mg Oral Daily  . polyethylene glycol  17 g Oral Daily  . rOPINIRole  1 mg Oral BID  . sodium chloride  3 mL Intravenous Q12H  . traZODone  150 mg Oral QHS  . vancomycin  1,000 mg Intravenous Q12H      OBJECTIVE: Physical Exam: Filed Vitals:   03/28/12 0441 03/28/12 0612 03/28/12 0819 03/28/12 0910  BP: 127/81 142/76 118/78   Pulse: 118 111 69   Temp: 98.1 F (36.7 C)  98.9 F (37.2 C)   TempSrc: Oral  Oral   Resp: 16  18   Height:      Weight: 340 lb 2.7 oz (154.3 kg)     SpO2: 92%  97% 96%    Intake/Output Summary (Last 24 hours) at 03/28/12 1124 Last data filed at 03/28/12 0800  Gross per 24 hour  Intake    480 ml  Output    950 ml  Net   -470 ml    Telemetry reveals afib with improved ventricular rates (110s-120s)  GEN- The patient is obese appearing, alert but confused,  Very chronically ill appearing Head- normocephalic, atraumatic Eyes-  Sclera clear, conjunctiva pink Ears- hearing intact Oropharynx- clear Neck- supple,  Diffuse but significant bruising around her neck anteriorly Lungs- Clear to ausculation  bilaterally, normal work of breathing Heart- irregular rate and rhythm,  GI- soft, NT, ND, + BS Extremities- no clubbing, cyanosis, dependant edema Skin- ecchymosis diffusely Psych- confused Neuro- moves all extremities  LABS: Basic Metabolic Panel:  Basename 03/28/12 0205 03/27/12 0615 03/26/12 0008  NA 136 138 --  K 5.7* 5.0 --  CL 104 106 --  CO2 19 22 --  GLUCOSE 116* 118* --  BUN 54* 55* --  CREATININE 1.35* 1.35* --  CALCIUM 8.9 9.1 --  MG -- -- 2.6*  PHOS -- -- 2.8   Liver Function Tests:  [facility] 03/26/12 0008  AST 22  ALT 44*  ALKPHOS 77  BILITOT 0.5  PROT 5.9*  ALBUMIN 3.5   No results found for this basename: LIPASE:2,AMYLASE:2 in the last 72 hours CBC:  Basename 03/28/12 0205 03/27/12 0615 03/26/12 0630  WBC 11.7* 8.7 --  NEUTROABS -- -- 8.3*  HGB 10.6* 9.2* --  HCT 35.5* 33.6* --  MCV 72.6* 73.2* --  PLT 166 158 --   Cardiac Enzymes:  Basename 03/28/12 0203 03/26/12 0630 03/26/12 0008  CKTOTAL -- -- --  CKMB -- -- --  CKMBINDEX -- -- --  TROPONINI 3.97* <0.30 <0.30   RADIOLOGY: Portable Chest 1 View  03/26/2012  *RADIOLOGY REPORT*  Clinical Data: CHF  PORTABLE CHEST - 1 VIEW  Comparison: None.  Findings: Previous CABG.  Mild cardiomegaly.  Relatively low lung volumes with resultant crowding of perihilar bronchovascular structures.  Cannot exclude mild central pulmonary vascular congestion.  No effusion.  IMPRESSION:  1.  Cardiomegaly with question of mild central pulmonary vascular congestion.   Original Report Authenticated By: D. Andria Rhein, MD     ASSESSMENT AND PLAN:  Principal Problem:  *Atrial fibrillation with RVR Active Problems:  CVA (cerebral infarction)  CHF (congestive heart failure)  CAD (coronary artery disease)  Renal failure  Acute on chronic systolic heart failure  Anemia  PLAN:  1. NSTEMI - elevated CMs, continue lovenox and proceed with RHC and LHC on Monday Continue beta blocker therapy Risks, benefits, and  alternatives to RHC/LHC with possible PCI were discussed at length with the patient and her two sons today.  They understand risks and wish to proceed.  2. Afib- V rates elevated Continue metoprolol to 75mg  Q6 hours today Continue lovenox 1mg /kg Q12.  Restart coumadin post cath.  Would plan TEE/DCCV and initiation of antiarrhythmic medicine later next week (depending on cath results probably on Tuesday). Antiarrhythmic options would include either tikosyn or amiodarone.  Given renal failure, Joice Lofts may not be the best option.  3. Acute on chronic renal failure EF is 25% by echo here with significant LV enlargement suggesting that this may chronic  Will give 1 dose of lasix today and monitor. Holding ACE inhibitor due to renal failure but hope to add if creatinine remains improved RHC will be helpful to guide volume management Renal function has improved off of lasix  4. Microcytic anemia Workup per primary team  5. AMS - work up and treatment per primary team   6. Recent CVA Continue long term anticoagulation Hold coumadin and bridge with lovenox for cath  7. Hyperkalemia Lasix 40mg  IV x 1 now,  Repeat BMET in 4 hours  As she is almost certainly going to have a positive dDimer, I have cancelled this test which was ordered this am.  She will require long term anticoagulation either way and would not be a candidate for CTA due to renal failure and planned cath.  My suspicion for PTE is very low.  As the lab has been unable to obtain blood draws today and she is afebrile, I will also cancel blood cultures.  These can be reordered in the future if she exhibits new signs/ symptoms concerning for bacteremia.  She is being optimally treated for NSTEMI.  I dont think that additional cardiac makers will add to our present management and have therefore discontinued CMs also.   Code status - presently full code  Hillis Range, MD 03/28/2012 11:24 AM

## 2012-03-28 NOTE — Progress Notes (Addendum)
Patient's O2 probe has not been reading accurately throughout the night.  This nurse has tried multiple sites with the forehead being the most accurate with a good wave.  Pt. Was on 2L O2 and has been satting fine (when O2 probe reading); however around 1 am I went into the room because her O2 probe was reading in the 70's with a good wave.  Patient was pale, extremely lethargic, and stated she was having some trouble breathing.  I increased her O2 with no changes in O2 probe reading.  After trying up to 6L O2 nasal cannula, I tried the venturi mask.  She was still satting 79% on 50% O2 with Venturi mask, so Non-rebreather was used.  Pt. O2 sats increased to 92 on Non-rebreather;  Md called and orders received; rapid response called and respiratory called.  ABG results show pO2 to be in the 200's, so Non-rebreather was removed and she was placed back on 2L O2 as advised by respiratory.  Patient stated that the mask "helped her breath better."  RR aware that patient's O2 probe still reading in the 80's but advised for this nurse to watch patient's mental status as a guide. VSS with the exception of patient's O2 sat. Probe readings.   EKG was done as well as ABG and Chest Xray.  More orders received from MD; will continue to monitor.  MD on call aware that phlebotomy could not get blood cultures due to patient's vasculature.    Vivi Martens RN

## 2012-03-28 NOTE — Progress Notes (Signed)
Called by bedside RN around 0115 regarding patient's O2 saturation reading 70s. Upon arrival to unit, patient had been placed on a NRB O2 saturation 90s. Patient in no acute distress, breath sounds clear bilateral, alert, oriented to self. HR 108, BP 127/74 RR 18, temp 97.9. NP at bedside, orders received for ABG, EKG, and chest xray. ABG pH 7.373, PCO2 31.6, PO2 216, bicarb 17.9. Patient placed back on to [place] 2L.  Pulse oximetry wave form inconsistent. Advised nursing staff to monitor resp status and mental status as indicator. Will continue to monitor, advised bedside RN to call with further needs.

## 2012-03-29 ENCOUNTER — Inpatient Hospital Stay (HOSPITAL_COMMUNITY): Payer: Medicare Other

## 2012-03-29 DIAGNOSIS — I214 Non-ST elevation (NSTEMI) myocardial infarction: Secondary | ICD-10-CM

## 2012-03-29 LAB — CBC
HCT: 33.1 % — ABNORMAL LOW (ref 36.0–46.0)
Hemoglobin: 9.7 g/dL — ABNORMAL LOW (ref 12.0–15.0)
MCH: 21.2 pg — ABNORMAL LOW (ref 26.0–34.0)
MCHC: 29.3 g/dL — ABNORMAL LOW (ref 30.0–36.0)
MCV: 72.3 fL — ABNORMAL LOW (ref 78.0–100.0)
RBC: 4.58 MIL/uL (ref 3.87–5.11)

## 2012-03-29 LAB — BASIC METABOLIC PANEL
BUN: 39 mg/dL — ABNORMAL HIGH (ref 6–23)
CO2: 23 mEq/L (ref 19–32)
Calcium: 8.5 mg/dL (ref 8.4–10.5)
Creatinine, Ser: 1.07 mg/dL (ref 0.50–1.10)
GFR calc non Af Amer: 53 mL/min — ABNORMAL LOW (ref >90)
Glucose, Bld: 108 mg/dL — ABNORMAL HIGH (ref 70–99)

## 2012-03-29 LAB — PROTIME-INR
INR: 1.95 — ABNORMAL HIGH (ref 0.00–1.49)
Prothrombin Time: 21.5 seconds — ABNORMAL HIGH (ref 11.6–15.2)

## 2012-03-29 LAB — GLUCOSE, CAPILLARY
Glucose-Capillary: 132 mg/dL — ABNORMAL HIGH (ref 70–99)
Glucose-Capillary: 113 mg/dL — ABNORMAL HIGH (ref 70–99)
Glucose-Capillary: 95 mg/dL (ref 70–99)

## 2012-03-29 MED ORDER — SODIUM CHLORIDE 0.9 % IJ SOLN
INTRAMUSCULAR | Status: AC
  Filled 2012-03-29: qty 40

## 2012-03-29 MED ORDER — VANCOMYCIN HCL 10 G IV SOLR
1500.0000 mg | Freq: Two times a day (BID) | INTRAVENOUS | Status: DC
  Administered 2012-03-30 – 2012-03-31 (×3): 1500 mg via INTRAVENOUS
  Filled 2012-03-29 (×5): qty 1500

## 2012-03-29 MED ORDER — SODIUM CHLORIDE 0.9 % IJ SOLN
10.0000 mL | Freq: Two times a day (BID) | INTRAMUSCULAR | Status: DC
  Administered 2012-03-29 – 2012-04-01 (×4): 10 mL

## 2012-03-29 MED ORDER — SODIUM CHLORIDE 0.9 % IJ SOLN
10.0000 mL | INTRAMUSCULAR | Status: DC | PRN
  Administered 2012-03-29: 10 mL

## 2012-03-29 NOTE — Progress Notes (Addendum)
Pt A&O throughout the shift but became pleasantly confused around 6pm. ? "[place]"?

## 2012-03-29 NOTE — Progress Notes (Signed)
Peripherally Inserted Central Catheter/Midline Placement  The IV Nurse has discussed with the patient and/or persons authorized to consent for the patient, the purpose of this procedure and the potential benefits and risks involved with this procedure.  The benefits include less needle sticks, lab draws from the catheter and patient may be discharged home with the catheter.  Risks include, but not limited to, infection, bleeding, blood clot (thrombus formation), and puncture of an artery; nerve damage and irregular heat beat.  Alternatives to this procedure were also discussed.  PICC/Midline Placement Documentation  PICC / Midline Double Lumen 03/29/12 PICC Right Basilic (Active)       [name] 03/29/2012, 9:48 AM

## 2012-03-29 NOTE — Progress Notes (Signed)
ANTIBIOTIC CONSULT NOTE - FOLLOW UP  Pharmacy Consult for vancomycin/cefepime/levaquin/lovenox/digoxin Indication: pneumonia/afib  Allergies  Allergen Reactions  . Penicillins Anaphylaxis  . Azithromycin Hives and Rash  . Bactrim (Sulfamethoxazole W-Trimethoprim) Hives and Rash  . Sulfa Antibiotics Hives and Rash    Patient Measurements: Height: 5\' 6"  (167.6 cm) Weight: 317 lb 0.3 oz (143.8 kg) IBW/kg (Calculated) : 59.3   Vital Signs: Temp: 98 F (36.7 C) (12/30 0800) Temp src: Oral (12/30 0800) BP: 119/67 mmHg (12/30 0442) Pulse Rate: 89  (12/30 0442) Intake/Output from previous day: 12/29 0701 - 12/30 0700 In: 1245.7 [P.O.:240; I.V.:105.7; IV Piggyback:900] Out: 2850 [Urine:2850] Intake/Output from this shift: Total I/O In: -  Out: 425 [Urine:425]  Labs:  [facility] 03/28/12 1510 03/28/12 0205 03/27/12 0615  WBC -- 11.7* 8.7  HGB -- 10.6* 9.2*  PLT -- 166 158  LABCREA -- -- --  CREATININE 1.18* 1.35* 1.35*   Estimated Creatinine Clearance: 68.9 ml/min (by C-G formula based on Cr of 1.18). No results found for this basename: VANCOTROUGH:2,VANCOPEAK:2,VANCORANDOM:2,GENTTROUGH:2,GENTPEAK:2,GENTRANDOM:2,TOBRATROUGH:2,TOBRAPEAK:2,TOBRARND:2,AMIKACINPEAK:2,AMIKACINTROU:2,AMIKACIN:2, in the last 72 hours   Microbiology: Recent Results (from the past 720 hour(s))  MRSA PCR SCREENING     Status: Abnormal   Collection Time   03/25/12 11:28 PM      Component Value Range Status Comment   MRSA by PCR POSITIVE (*) NEGATIVE Final   URINE CULTURE     Status: Normal   Collection Time   03/26/12  1:00 AM      Component Value Range Status Comment   Specimen Description URINE, RANDOM   Final    Special Requests NONE   Final    Culture  Setup Time 03/26/2012 08:34   Final    Colony Count NO GROWTH   Final    Culture NO GROWTH   Final    Report Status 03/27/2012 FINAL   Final   CULTURE, EXPECTORATED SPUTUM-ASSESSMENT     Status: Normal   Collection Time   03/28/12  8:37 AM        Component Value Range Status Comment   Specimen Description SPUTUM   Final    Special Requests NONE   Final    Sputum evaluation     Final    Value: MICROSCOPIC FINDINGS SUGGEST THAT THIS SPECIMEN IS NOT REPRESENTATIVE OF LOWER RESPIRATORY SECRETIONS. PLEASE RECOLLECT.     CALLED TO [facility] DAVIS RN 03/28/12 6578 WOOTEN,K   Report Status 03/28/2012 FINAL   Final      Assessment:  Day # 2 aztreonam/levofloxacin/vancomycin for PNA. Creatinine continues downward trend 1.68-1.55-1.35 - 1.18 today.  Wt 143.8 kg. Creat cl ~ 69 ml/min. Afebrile. Last WBC 11.7 12/29. CXR 12/29 - patchy bilateral opacities - concern for PNA.  Coag: on full dose LMWH for afib; 12/29 H/H 10.6/35.5. PLTC 166. No bleeding reported. To restart coumadin post cath today per cards note.  Digoxin: HR 69 - 118, creat 1.18, in afib w/ improved VR. For R and L H cath today.  For TEE/DCCV and tikosyn or amio later this week.   Goal of Therapy:  Vancomycin trough level 15-20 mcg/ml  Plan:  1. Increase vancomycin to 1500 mg IV q12h  2. Continue maxipime 2 gm IV q8h 3. Continue levaquin 750 mg IV q24 4. Continue digoxin 0.125 mg po qday 5. Continue full dose LMWH 145 mg sq q12h 6. F/u for orders to resume coumadin post cath today Herby Abraham, Pharm.D. [phone] 03/29/2012 9:46 AM   Len Childs T 03/29/2012,9:36 AM

## 2012-03-29 NOTE — Progress Notes (Signed)
SUBJECTIVE: No chest pain or SOB. Only c/o back pain.   BP 119/67  Pulse 89  Temp 98 F (36.7 C) (Oral)  Resp 19  Ht 5\' 6"  (1.676 m)  Wt 317 lb 0.3 oz (143.8 kg)  BMI 51.17 kg/m2  SpO2 100%  Intake/Output Summary (Last 24 hours) at 03/29/12 1306 Last data filed at 03/29/12 0800  Gross per 24 hour  Intake 1005.67 ml  Output   3175 ml  Net -2169.33 ml    PHYSICAL EXAM General: Well developed, well nourished, in no acute distress. Alert and oriented x 3.  Psych:  Good affect, responds appropriately Neck: No masses noted.  Lungs: Poor air movement. Clear bilaterally with no wheezes or rhonci noted.  Heart: Irregular irregular with no murmurs noted. Abdomen: Bowel sounds are present. Soft, non-tender.  Extremities: Trace bilateral lower extremity edema.   LABS: Basic Metabolic Panel:  Basename 03/29/12 0500 03/28/12 1510  NA 138 139  K 4.3 4.4  CL 105 105  CO2 23 21  GLUCOSE 108* 133*  BUN 39* 50*  CREATININE 1.07 1.18*  CALCIUM 8.5 8.5  MG -- --  PHOS -- --   CBC:  Basename 03/29/12 0500 03/28/12 0205  WBC 6.8 11.7*  NEUTROABS -- --  HGB 9.7* 10.6*  HCT 33.1* 35.5*  MCV 72.3* 72.6*  PLT 171 166   Cardiac Enzymes:  Basename 03/28/12 0203  CKTOTAL --  CKMB --  CKMBINDEX --  TROPONINI 3.97*   Current Meds:    . aspirin EC  81 mg Oral Daily  . atorvastatin  10 mg Oral q1800  . aztreonam  2 g Intravenous Q8H  . budesonide  0.5 mg Nebulization BID  . Chlorhexidine Gluconate Cloth  6 each Topical Q0600  . digoxin  0.125 mg Oral Daily  . enoxaparin (LOVENOX) injection  1 mg/kg Subcutaneous BID  . insulin aspart  0-15 Units Subcutaneous TID WC  . levofloxacin (LEVAQUIN) IV  750 mg Intravenous QHS  . metoprolol tartrate  75 mg Oral Q6H  . montelukast  10 mg Oral QHS  . mupirocin ointment  1 application Nasal BID  . pantoprazole  40 mg Oral Daily  . polyethylene glycol  17 g Oral Daily  . rOPINIRole  1 mg Oral BID  . sodium chloride  10-40 mL  Intracatheter Q12H  . sodium chloride  3 mL Intravenous Q12H  . sodium chloride  3 mL Intravenous Q12H  . sodium chloride      . traZODone  150 mg Oral QHS  . vancomycin  1,500 mg Intravenous Q12H     ASSESSMENT AND PLAN:   1. NSTEMI: Troponin elevated yesterday am. She has a history of CAD with prior CABG. LV function has worsened. Cardiac cath cancelled today secondary to elevated INR. (1.95). Coumadin is on hold. Continue lovenox for now. Will plan RHC and LHC tomorrow if INR less than 1.7. Continue beta blocker therapy   2. Atrial fibrillation: Ventricular rates 90-110. Continue metoprolol 75mg  Q6 hours. Continue lovenox 1mg /kg Q12. Restart coumadin post cath. Will plan TEE/DCCV and initiation of antiarrhythmic medicine later this week.  Antiarrhythmic options would include either tikosyn or amiodarone. Given renal failure, Joice Lofts may not be the best option.   3. Acute on chronic systolic CHF: LVEF is 25% by echo here with significant LV enlargement suggesting that her LV dysfunction may be chronic. Will give 1 dose of lasix today. Will restart Ace-inhibitor tomorrow if creatinine stable.  RHC will be helpful  to guide volume management.   4. Acute renal failure: Creatinine improved.    5.  Microcytic anemia: Workup per primary team   6. AMS: work up and treatment per primary team   7. Recent CVA: Continue long term anticoagulation. Hold coumadin and bridge with lovenox for cath   Code status: presently full code   Bowden Boody  12/30/20131:06 PM

## 2012-03-29 NOTE — Progress Notes (Signed)
[facility] PROGRESS NOTE  Caitlyn Hardy UJW:[id number] DOB: Jan 06, 1946 DOA: 03/25/2012 PCP: No primary provider on file.  HPI/Subjective: Ongoing confusion at nighttime, likely secondary to sundowning. Per notes she will undergo cardiac catheterization to   Assessment/Plan:  Non-ST segment elevation MI -With elevated troponin of 3.9. Continue beta blockers, Lovenox and aspirin. -Patient will have cardiac catheterization today.  Acute on chronic systolic CHF -Left ventricle ejection fraction is 20% checked on 03/23/2012 an outside hospital, LVEF was 40% in 2011. -Surprisingly patient is not short of breath, not overtly fluid overloaded, cardiology following. -Patient is on metoprolol 25 mg every 6 hours, digoxin. -Elevated troponin at 3.9, patient is on heparin (on bridge because of subtherapeutic INR), cards to advise  Atrial fibrillation with RVR -Rate is controlled, she is on metoprolol and digoxin, cardiology following. -patient is on Coumadin (is on hold now for anticipated procedures), INR is 2.5.   Pneumonia -Chest x-ray concerning for pneumonia. -Had leukocytosis and shortness of breath. Antibiotics started for pneumonia.  Renal failure -Assumed acute renal failure, no records in the hospital here. -Presented with creatinine of 1.68, creatinine today is 1.35. Followup BMP closely. -Off of the Lasix, creatinine is improving.  Altered mental status -Per family this happen towards night time, she is awake alert oriented x3 now. -It could be secondary to sundowning, we will try to avoid narcotics, benzos and hypnotics.  Recent CVA -Await the complete report from [facility], unsure if it's cerebellar or basal ganglia stroke.  Diabetes mellitus type 2 -Carbohydrate modified diet, insulin sliding scale and will check hemoglobin A1c.  Code Status: Full code Family Communication:  Disposition Plan: remains inpatient   Consultants:  [place]  cardiology  Procedures:  none  Antibiotics:  none  Objective: Filed Vitals:   03/29/12 0442 03/29/12 0500 03/29/12 0800 03/29/12 0814  BP: 119/67     Pulse: 89     Temp: 98 F (36.7 C)  98 F (36.7 C)   TempSrc: Oral  Oral   Resp: 19     Height:      Weight:  143.8 kg (317 lb 0.3 oz)    SpO2: 100%   100%    Intake/Output Summary (Last 24 hours) at 03/29/12 1233 Last data filed at 03/29/12 0800  Gross per 24 hour  Intake 1005.67 ml  Output   3175 ml  Net -2169.33 ml   Filed Weights   03/25/12 2230 03/28/12 0441 03/29/12 0500  Weight: 147.5 kg (325 lb 2.9 oz) 154.3 kg (340 lb 2.7 oz) 143.8 kg (317 lb 0.3 oz)    Exam:  General: Alert and awake, oriented x3, not in any acute distress. HEENT: anicteric sclera, pupils reactive to light and accommodation, EOMI CVS: S1-S2 clear, no murmur rubs or gallops Chest: clear to auscultation bilaterally, no wheezing, rales or rhonchi Abdomen: soft nontender, nondistended, normal bowel sounds, no organomegaly Extremities: no cyanosis, clubbing or edema noted bilaterally Neuro: Cranial nerves II-XII intact, no focal neurological deficits  Data Reviewed: Basic Metabolic Panel:  Lab 03/29/12 5621 03/28/12 1510 03/28/12 0205 03/27/12 0615 03/26/12 0630 03/26/12 0008  NA 138 139 136 138 138 --  K 4.3 4.4 5.7* 5.0 4.7 --  CL 105 105 104 106 106 --  CO2 23 21 19 22 20  --  GLUCOSE 108* 133* 116* 118* 120* --  BUN 39* 50* 54* 55* 65* --  CREATININE 1.07 1.18* 1.35* 1.35* 1.55* --  CALCIUM 8.5 8.5 8.9 9.1 8.9 --  MG -- -- -- -- --  2.6*  PHOS -- -- -- -- -- 2.8   Liver Function Tests:  Lab 03/26/12 0008  AST 22  ALT 44*  ALKPHOS 77  BILITOT 0.5  PROT 5.9*  ALBUMIN 3.5   No results found for this basename: LIPASE:5,AMYLASE:5 in the last 168 hours No results found for this basename: AMMONIA:5 in the last 168 hours CBC:  Lab 03/29/12 0500 03/28/12 0205 03/27/12 0615 03/26/12 0630  WBC 6.8 11.7* 8.7 10.4  NEUTROABS -- --  -- 8.3*  HGB 9.7* 10.6* 9.2* 8.8*  HCT 33.1* 35.5* 33.6* 29.6*  MCV 72.3* 72.6* 73.2* 72.5*  PLT 171 166 158 166   Cardiac Enzymes:  Lab 03/28/12 0203 03/26/12 0630 03/26/12 0008  CKTOTAL -- -- --  CKMB -- -- --  CKMBINDEX -- -- --  TROPONINI 3.97* <0.30 <0.30   BNP (last 3 results)  Basename 03/26/12 0008  PROBNP 10380.0*   CBG:  Lab 03/29/12 0743 03/28/12 2218 03/28/12 1709 03/28/12 1157 03/28/12 0819  GLUCAP 132* 117* 126* 97 105*    Recent Results (from the past 240 hour(s))  MRSA PCR SCREENING     Status: Abnormal   Collection Time   03/25/12 11:28 PM      Component Value Range Status Comment   MRSA by PCR POSITIVE (*) NEGATIVE Final   URINE CULTURE     Status: Normal   Collection Time   03/26/12  1:00 AM      Component Value Range Status Comment   Specimen Description URINE, RANDOM   Final    Special Requests NONE   Final    Culture  Setup Time 03/26/2012 08:34   Final    Colony Count NO GROWTH   Final    Culture NO GROWTH   Final    Report Status 03/27/2012 FINAL   Final   CULTURE, EXPECTORATED SPUTUM-ASSESSMENT     Status: Normal   Collection Time   03/28/12  8:37 AM      Component Value Range Status Comment   Specimen Description SPUTUM   Final    Special Requests NONE   Final    Sputum evaluation     Final    Value: MICROSCOPIC FINDINGS SUGGEST THAT THIS SPECIMEN IS NOT REPRESENTATIVE OF LOWER RESPIRATORY SECRETIONS. PLEASE RECOLLECT.     CALLED TO [facility] DAVIS RN 03/28/12 1610 WOOTEN,K   Report Status 03/28/2012 FINAL   Final      Studies: Dg Chest Port 1 View  03/29/2012  *RADIOLOGY REPORT*  Clinical Data: Line placement.  PORTABLE CHEST - 1 VIEW  Comparison: 03/28/2012 and 03/26/2012.  Findings: 1007 hours.  Right arm PICC has been placed, the tip projecting over the right paratracheal region consistent with position in the proximal SVC.  The patient remains mildly rotated to the right.  There is stable cardiomegaly status post CABG. Overall lung  volumes have improved.  There is no pneumothorax or significant pleural effusion.  IMPRESSION:  1.  Right arm PICC projects to the upper SVC level. 2.  No pneumothorax identified.  Basilar aeration has improved.   Original Report Authenticated By: Carey Bullocks, M.D.    Dg Chest Port 1 View  03/28/2012  *RADIOLOGY REPORT*  Clinical Data: Hypoxia.  PORTABLE CHEST - 1 VIEW  Comparison: Chest radiograph performed 03/26/2012  Findings: The lungs are hypoexpanded.  Patchy bilateral airspace opacities raise concern for pneumonia.  No definite pleural effusion or pneumothorax is seen.  The cardiomediastinal silhouette is enlarged; the patient is status post median  sternotomy, with evidence of prior CABG.  No acute osseous abnormalities are seen.  IMPRESSION:  1.  Lungs hypoexpanded.  Patchy bilateral airspace opacities raise concern for pneumonia. 2.  Cardiomegaly.  These results were called by telephone on 03/28/2012 at 02:01 a.m. to [facility] on [id number], who verbally acknowledged these results.   Original Report Authenticated By: Tonia Ghent, M.D.     Scheduled Meds:    . aspirin EC  81 mg Oral Daily  . atorvastatin  10 mg Oral q1800  . aztreonam  2 g Intravenous Q8H  . budesonide  0.5 mg Nebulization BID  . Chlorhexidine Gluconate Cloth  6 each Topical Q0600  . digoxin  0.125 mg Oral Daily  . enoxaparin (LOVENOX) injection  1 mg/kg Subcutaneous BID  . insulin aspart  0-15 Units Subcutaneous TID WC  . levofloxacin (LEVAQUIN) IV  750 mg Intravenous QHS  . metoprolol tartrate  75 mg Oral Q6H  . montelukast  10 mg Oral QHS  . mupirocin ointment  1 application Nasal BID  . pantoprazole  40 mg Oral Daily  . polyethylene glycol  17 g Oral Daily  . rOPINIRole  1 mg Oral BID  . sodium chloride  10-40 mL Intracatheter Q12H  . sodium chloride  3 mL Intravenous Q12H  . sodium chloride  3 mL Intravenous Q12H  . sodium chloride      . traZODone  150 mg Oral QHS  . vancomycin  1,500 mg Intravenous  Q12H   Continuous Infusions:    Principal Problem:  *Atrial fibrillation with RVR Active Problems:  CVA (cerebral infarction)  CHF (congestive heart failure)  CAD (coronary artery disease)  Renal failure  Acute on chronic systolic heart failure  Anemia    Time spent: 35 minutes    [facility] A  [facility] Pager [phone]. If 8PM-8AM, please contact night-coverage at [email or web address], password [facility] 03/29/2012, 12:33 PM  LOS: 4 days

## 2012-03-30 ENCOUNTER — Encounter (HOSPITAL_COMMUNITY)
Admission: AD | Disposition: A | Discharge status: Skilled Nursing Facility | Payer: Self-pay | Source: Other Acute Inpatient Hospital | Attending: Cardiovascular Disease

## 2012-03-30 DIAGNOSIS — I2510 Atherosclerotic heart disease of native coronary artery without angina pectoris: Secondary | ICD-10-CM

## 2012-03-30 DIAGNOSIS — I509 Heart failure, unspecified: Secondary | ICD-10-CM

## 2012-03-30 HISTORY — PX: LEFT AND RIGHT HEART CATHETERIZATION WITH CORONARY/GRAFT ANGIOGRAM: SHX5448

## 2012-03-30 LAB — GLUCOSE, CAPILLARY
Glucose-Capillary: 103 mg/dL — ABNORMAL HIGH (ref 70–99)
Glucose-Capillary: 149 mg/dL — ABNORMAL HIGH (ref 70–99)

## 2012-03-30 LAB — BASIC METABOLIC PANEL
BUN: 31 mg/dL — ABNORMAL HIGH (ref 6–23)
CO2: 25 mEq/L (ref 19–32)
Chloride: 107 mEq/L (ref 96–112)
GFR calc Af Amer: 62 mL/min — ABNORMAL LOW (ref >90)
Potassium: 4.2 mEq/L (ref 3.5–5.1)

## 2012-03-30 LAB — POCT I-STAT 3, VENOUS BLOOD GAS (G3P V)
Acid-base deficit: 5 mmol/L — ABNORMAL HIGH (ref 0.0–2.0)
Bicarbonate: 21.5 mEq/L (ref 20.0–24.0)
O2 Saturation: 31 %
TCO2: 23 mmol/L (ref 0–100)
pO2, Ven: 22 mmHg — CL (ref 30.0–45.0)

## 2012-03-30 LAB — CBC
HCT: 33.5 % — ABNORMAL LOW (ref 36.0–46.0)
Hemoglobin: 9.7 g/dL — ABNORMAL LOW (ref 12.0–15.0)
MCV: 73.5 fL — ABNORMAL LOW (ref 78.0–100.0)
RBC: 4.56 MIL/uL (ref 3.87–5.11)
RDW: 19.4 % — ABNORMAL HIGH (ref 11.5–15.5)
WBC: 6.8 10*3/uL (ref 4.0–10.5)

## 2012-03-30 LAB — POCT I-STAT 3, ART BLOOD GAS (G3+)
O2 Saturation: 92 %
TCO2: 20 mmol/L (ref 0–100)

## 2012-03-30 SURGERY — LEFT AND RIGHT HEART CATHETERIZATION WITH CORONARY/GRAFT ANGIOGRAM
Anesthesia: LOCAL

## 2012-03-30 MED ORDER — LIDOCAINE HCL (PF) 1 % IJ SOLN
INTRAMUSCULAR | Status: AC
  Filled 2012-03-30: qty 30

## 2012-03-30 MED ORDER — ACETAMINOPHEN 325 MG PO TABS: 650.0000 mg | ORAL_TABLET | ORAL | Status: DC | PRN

## 2012-03-30 MED ORDER — DIGOXIN 0.0625 MG HALF TABLET
0.0625 mg | ORAL_TABLET | Freq: Every day | ORAL | Status: DC
  Filled 2012-03-30: qty 1

## 2012-03-30 MED ORDER — HEPARIN (PORCINE) IN NACL 2-0.9 UNIT/ML-% IJ SOLN
INTRAMUSCULAR | Status: AC
  Filled 2012-03-30: qty 1500

## 2012-03-30 MED ORDER — SODIUM CHLORIDE 0.9 % IV SOLN
250.0000 mL | INTRAVENOUS | Status: DC
  Administered 2012-03-30: 20:00:00 via INTRAVENOUS

## 2012-03-30 MED ORDER — MIDAZOLAM HCL 2 MG/2ML IJ SOLN
INTRAMUSCULAR | Status: AC
  Filled 2012-03-30: qty 2

## 2012-03-30 MED ORDER — SODIUM CHLORIDE 0.9 % IJ SOLN: 3.0000 mL | INTRAMUSCULAR | Status: DC | PRN

## 2012-03-30 MED ORDER — SODIUM CHLORIDE 0.9 % IJ SOLN: 3.0000 mL | Freq: Two times a day (BID) | INTRAMUSCULAR | Status: DC

## 2012-03-30 MED ORDER — HEPARIN (PORCINE) IN NACL 100-0.45 UNIT/ML-% IJ SOLN
1350.0000 [IU]/h | INTRAMUSCULAR | Status: DC
  Administered 2012-03-31: 1350 [IU]/h via INTRAVENOUS
  Filled 2012-03-30 (×2): qty 250

## 2012-03-30 MED ORDER — SODIUM CHLORIDE 0.9 % IV SOLN: 250.0000 mL | INTRAVENOUS | Status: DC

## 2012-03-30 MED ORDER — PNEUMOCOCCAL VAC POLYVALENT 25 MCG/0.5ML IJ INJ
0.5000 mL | INJECTION | INTRAMUSCULAR | Status: AC
  Filled 2012-03-30: qty 0.5

## 2012-03-30 MED ORDER — ONDANSETRON HCL 4 MG/2ML IJ SOLN: 4.0000 mg | Freq: Four times a day (QID) | INTRAMUSCULAR | Status: DC | PRN

## 2012-03-30 MED ORDER — FUROSEMIDE 10 MG/ML IJ SOLN
40.0000 mg | Freq: Two times a day (BID) | INTRAMUSCULAR | Status: DC
  Administered 2012-03-30 – 2012-03-31 (×2): 40 mg via INTRAVENOUS
  Filled 2012-03-30 (×4): qty 4

## 2012-03-30 MED ORDER — FENTANYL CITRATE 0.05 MG/ML IJ SOLN
INTRAMUSCULAR | Status: AC
  Filled 2012-03-30: qty 2

## 2012-03-30 MED ORDER — NITROGLYCERIN 0.2 MG/ML ON CALL CATH LAB
INTRAVENOUS | Status: AC
  Filled 2012-03-30: qty 1

## 2012-03-30 MED ORDER — WARFARIN SODIUM 5 MG PO TABS
5.0000 mg | ORAL_TABLET | Freq: Once | ORAL | Status: AC
  Administered 2012-03-30: 5 mg via ORAL
  Filled 2012-03-30: qty 1

## 2012-03-30 MED ORDER — WARFARIN - PHARMACIST DOSING INPATIENT
Freq: Every day | Status: DC
  Administered 2012-03-31 – 2012-04-02 (×3)

## 2012-03-30 MED ORDER — AMIODARONE HCL IN DEXTROSE 360-4.14 MG/200ML-% IV SOLN
30.0000 mg/h | INTRAVENOUS | Status: DC
  Administered 2012-03-30 (×2): 60 mg/h via INTRAVENOUS
  Administered 2012-03-31 – 2012-04-02 (×4): 30 mg/h via INTRAVENOUS
  Filled 2012-03-30 (×15): qty 200

## 2012-03-30 NOTE — Progress Notes (Signed)
[facility] PROGRESS NOTE  Caitlyn Hardy ZOX:[id number] DOB: Oct 13, 1945 DOA: 03/25/2012 PCP: No primary provider on file.  HPI/Subjective: Ongoing confusion at nighttime, likely secondary to sundowning. She will have cardiac catheterization today.   Assessment/Plan:  Non-ST segment elevation MI -With elevated troponin of 3.9. Continue beta blockers, Lovenox and aspirin. -Patient will have cardiac catheterization today.  Acute on chronic systolic CHF -Left ventricle ejection fraction is 20% checked on 03/23/2012 an outside hospital, LVEF was 40% in 2011. -Surprisingly patient is not short of breath, not overtly fluid overloaded, cardiology following. -Patient is on metoprolol 25 mg every 6 hours, digoxin. -Elevated troponin at 3.9, patient is on heparin (on bridge because of subtherapeutic INR), cards to advise  Atrial fibrillation with RVR -Rate is controlled, she is on metoprolol and digoxin, cardiology following. -patient is on Coumadin (is on hold now for anticipated procedures), INR is 2.5.   Pneumonia -Chest x-ray concerning for pneumonia. -Had leukocytosis and shortness of breath. Antibiotics started for pneumonia. -I will de-escalate her antibiotics, she'll be on Levaquin  Renal failure -Assumed acute renal failure, no records in the hospital here. -Presented with creatinine of 1.68, creatinine today is 1.06. Followup BMP closely. -Off of the Lasix, creatinine is improving. -Monitor renal function after the cardiac catheterization.  Altered mental status -Per family this happen towards night time, she is awake alert oriented x3 now. -It could be secondary to sundowning, we will try to avoid narcotics, benzos and hypnotics.  Recent CVA -Await the complete report from [facility], unsure if it's cerebellar or basal ganglia stroke.  Diabetes mellitus type 2 -Carbohydrate modified diet, insulin sliding scale and will check hemoglobin A1c.  Code Status:  Full code Family Communication:  Disposition Plan: remains inpatient   Consultants:  [place] cardiology  Procedures:  none  Antibiotics:  none  Objective: Filed Vitals:   03/30/12 0749 03/30/12 0855 03/30/12 1100 03/30/12 1126  BP:      Pulse:      Temp: 97.7 F (36.5 C)  97.9 F (36.6 C) 97.9 F (36.6 C)  TempSrc: Oral   Oral  Resp:      Height:      Weight:      SpO2:  100%      Intake/Output Summary (Last 24 hours) at 03/30/12 1219 Last data filed at 03/30/12 1100  Gross per 24 hour  Intake    650 ml  Output    875 ml  Net   -225 ml   Filed Weights   03/29/12 0500 03/30/12 0300 03/30/12 0437  Weight: 143.8 kg (317 lb 0.3 oz) 65.137 kg (143 lb 9.6 oz) 143.3 kg (315 lb 14.7 oz)    Exam:  General: Alert and awake, oriented x3, not in any acute distress. HEENT: anicteric sclera, pupils reactive to light and accommodation, EOMI CVS: S1-S2 clear, no murmur rubs or gallops Chest: clear to auscultation bilaterally, no wheezing, rales or rhonchi Abdomen: soft nontender, nondistended, normal bowel sounds, no organomegaly Extremities: no cyanosis, clubbing or edema noted bilaterally Neuro: Cranial nerves II-XII intact, no focal neurological deficits  Data Reviewed: Basic Metabolic Panel:  Lab 03/30/12 8119 03/29/12 0500 03/28/12 1510 03/28/12 0205 03/27/12 0615 03/26/12 0008  NA 138 138 139 136 138 --  K 4.2 4.3 4.4 5.7* 5.0 --  CL 107 105 105 104 106 --  CO2 25 23 21 19 22  --  GLUCOSE 99 108* 133* 116* 118* --  BUN 31* 39* 50* 54* 55* --  CREATININE 1.06 1.07 1.18*  1.35* 1.35* --  CALCIUM 8.8 8.5 8.5 8.9 9.1 --  MG -- -- -- -- -- 2.6*  PHOS -- -- -- -- -- 2.8   Liver Function Tests:  Lab 03/26/12 0008  AST 22  ALT 44*  ALKPHOS 77  BILITOT 0.5  PROT 5.9*  ALBUMIN 3.5   No results found for this basename: LIPASE:5,AMYLASE:5 in the last 168 hours No results found for this basename: AMMONIA:5 in the last 168 hours CBC:  Lab 03/30/12 0430  03/29/12 0500 03/28/12 0205 03/27/12 0615 03/26/12 0630  WBC 6.8 6.8 11.7* 8.7 10.4  NEUTROABS -- -- -- -- 8.3*  HGB 9.7* 9.7* 10.6* 9.2* 8.8*  HCT 33.5* 33.1* 35.5* 33.6* 29.6*  MCV 73.5* 72.3* 72.6* 73.2* 72.5*  PLT 160 171 166 158 166   Cardiac Enzymes:  Lab 03/28/12 0203 03/26/12 0630 03/26/12 0008  CKTOTAL -- -- --  CKMB -- -- --  CKMBINDEX -- -- --  TROPONINI 3.97* <0.30 <0.30   BNP (last 3 results)  Basename 03/26/12 0008  PROBNP 10380.0*   CBG:  Lab 03/30/12 1123 03/30/12 0752 03/29/12 2147 03/29/12 1818 03/29/12 1247  GLUCAP 103* 81 95 119* 113*    Recent Results (from the past 240 hour(s))  MRSA PCR SCREENING     Status: Abnormal   Collection Time   03/25/12 11:28 PM      Component Value Range Status Comment   MRSA by PCR POSITIVE (*) NEGATIVE Final   URINE CULTURE     Status: Normal   Collection Time   03/26/12  1:00 AM      Component Value Range Status Comment   Specimen Description URINE, RANDOM   Final    Special Requests NONE   Final    Culture  Setup Time 03/26/2012 08:34   Final    Colony Count NO GROWTH   Final    Culture NO GROWTH   Final    Report Status 03/27/2012 FINAL   Final   CULTURE, EXPECTORATED SPUTUM-ASSESSMENT     Status: Normal   Collection Time   03/28/12  8:37 AM      Component Value Range Status Comment   Specimen Description SPUTUM   Final    Special Requests NONE   Final    Sputum evaluation     Final    Value: MICROSCOPIC FINDINGS SUGGEST THAT THIS SPECIMEN IS NOT REPRESENTATIVE OF LOWER RESPIRATORY SECRETIONS. PLEASE RECOLLECT.     CALLED TO [facility] DAVIS RN 03/28/12 1610 WOOTEN,K   Report Status 03/28/2012 FINAL   Final      Studies: Dg Chest Port 1 View  03/29/2012  *RADIOLOGY REPORT*  Clinical Data: Line placement.  PORTABLE CHEST - 1 VIEW  Comparison: 03/28/2012 and 03/26/2012.  Findings: 1007 hours.  Right arm PICC has been placed, the tip projecting over the right paratracheal region consistent with position in the  proximal SVC.  The patient remains mildly rotated to the right.  There is stable cardiomegaly status post CABG. Overall lung volumes have improved.  There is no pneumothorax or significant pleural effusion.  IMPRESSION:  1.  Right arm PICC projects to the upper SVC level. 2.  No pneumothorax identified.  Basilar aeration has improved.   Original Report Authenticated By: Carey Bullocks, M.D.     Scheduled Meds:    . aspirin EC  81 mg Oral Daily  . atorvastatin  10 mg Oral q1800  . aztreonam  2 g Intravenous Q8H  . budesonide  0.5 mg  Nebulization BID  . digoxin  0.125 mg Oral Daily  . enoxaparin (LOVENOX) injection  1 mg/kg Subcutaneous BID  . insulin aspart  0-15 Units Subcutaneous TID WC  . metoprolol tartrate  75 mg Oral Q6H  . montelukast  10 mg Oral QHS  . mupirocin ointment  1 application Nasal BID  . pantoprazole  40 mg Oral Daily  . polyethylene glycol  17 g Oral Daily  . rOPINIRole  1 mg Oral BID  . sodium chloride  10-40 mL Intracatheter Q12H  . sodium chloride  3 mL Intravenous Q12H  . sodium chloride  3 mL Intravenous Q12H  . traZODone  150 mg Oral QHS  . vancomycin  1,500 mg Intravenous Q12H   Continuous Infusions:    Principal Problem:  *Atrial fibrillation with RVR Active Problems:  CVA (cerebral infarction)  CHF (congestive heart failure)  CAD (coronary artery disease)  Renal failure  Acute on chronic systolic heart failure  Anemia  NSTEMI (non-ST elevated myocardial infarction)    Time spent: 35 minutes    [facility] A  [facility] Pager [phone]. If 8PM-8AM, please contact night-coverage at [email or web address], password [facility] 03/30/2012, 12:19 PM  LOS: 5 days

## 2012-03-30 NOTE — CV Procedure (Addendum)
   Cardiac Catheterization Procedure Note  Name: Caitlyn Hardy MRN: [id number] DOB: [birth year]  Procedure: Right Heart Cath, Left Heart Cath, Selective Coronary Angiography  Indication: CHF, NSTEMI   Procedural Details: The right groin was prepped, draped, and anesthetized with 1% lidocaine. Using the modified Seldinger technique a 5 French sheath was placed in the right femoral artery and a 7 French sheath was placed in the right femoral vein. A Swan-Ganz catheter was used for the right heart catheterization. Standard protocol was followed for recording of right heart pressures and sampling of oxygen saturations. Fick cardiac output was calculated. Standard Judkins catheters were used for selective coronary angiography, SVG angiography, and LIMA angiography. A Mynx device was used for femoral arterial hemostasis. There were no immediate procedural complications. The patient was transferred to the post catheterization recovery area for further monitoring.  Procedural Findings: Hemodynamics RA 24 RV 82/25 PA 87/44 mean 60 PCWP not recorded AO 119/68 LV 113/16  Oxygen saturations: PA 31 AO 92  Cardiac Output (Fick) 4.1 L/m  Cardiac Index (Fick) 1.65 L/m/square meter   Coronary angiography: Coronary dominance: right  Left mainstem: Patent with 30% distal left main stenosis  Left anterior descending (LAD): Totally occluded.  Left circumflex (LCx): Widely patent. The proximal and mid vessel is stented with no significant in-stent restenosis. There is a large obtuse marginal branch without significant stenosis.  Right coronary artery (RCA): Diffusely diseased in the mid vessel leading into a total occlusion in the distal vessel.  SVG to diagonal: The vein graft is widely patent with mild irregularity. The diagonal ranch itself is very small with no significant stenosis.  SVG to right PDA: Widely patent throughout. No significant stenosis. The PDA branch is patent and this retrograde  fills other subbranches of the right coronary artery.  LIMA to LAD: Widely patent with no significant stenosis. The mid and distal LAD have mild diffuse disease without significant stenosis. This graft retrograde fills a large diagonal branch without significant stenosis.  Left ventriculography: deferred  Final Conclusions:   1. severe native three-vessel coronary artery disease 2. Continued patency of the saphenous vein graft to PDA, saphenous vein graft to diagonal, and LIMA to LAD 3. Continued patency of the stented segment in the left circumflex 4. Severe congestive heart failure with markedly elevated right heart pressures and low cardiac output  Recommendations:  Discussed with Dr. Shirlee Latch. The patient will be managed by the heart failure service. I'm going to start her on IV amiodarone for rate control. She will be started on unfractionated heparin and warfarin for anticoagulation. Tentatively plan on TEE cardioversion January 2. Will start IV furosemide for diuresis. Will need to watch QTc with this patient on Amiodarone and Levaquin. Would consider stopping digoxin over next few days to limit potential medication toxicity.  Tonny Bollman 03/30/2012, 5:42 PM

## 2012-03-30 NOTE — H&P (View-Only) (Signed)
  SUBJECTIVE: No chest pain or SOB. Only c/o back pain.   BP 119/67  Pulse 89  Temp 98 F (36.7 C) (Oral)  Resp 19  Ht 5' 6" (1.676 m)  Wt 317 lb 0.3 oz (143.8 kg)  BMI 51.17 kg/m2  SpO2 100%  Intake/Output Summary (Last 24 hours) at 03/29/12 1306 Last data filed at 03/29/12 0800  Gross per 24 hour  Intake 1005.67 ml  Output   3175 ml  Net -2169.33 ml    PHYSICAL EXAM General: Well developed, well nourished, in no acute distress. Alert and oriented x 3.  Psych:  Good affect, responds appropriately Neck: No masses noted.  Lungs: Poor air movement. Clear bilaterally with no wheezes or rhonci noted.  Heart: Irregular irregular with no murmurs noted. Abdomen: Bowel sounds are present. Soft, non-tender.  Extremities: Trace bilateral lower extremity edema.   LABS: Basic Metabolic Panel:  Basename 03/29/12 0500 03/28/12 1510  NA 138 139  K 4.3 4.4  CL 105 105  CO2 23 21  GLUCOSE 108* 133*  BUN 39* 50*  CREATININE 1.07 1.18*  CALCIUM 8.5 8.5  MG -- --  PHOS -- --   CBC:  Basename 03/29/12 0500 03/28/12 0205  WBC 6.8 11.7*  NEUTROABS -- --  HGB 9.7* 10.6*  HCT 33.1* 35.5*  MCV 72.3* 72.6*  PLT 171 166   Cardiac Enzymes:  Basename 03/28/12 0203  CKTOTAL --  CKMB --  CKMBINDEX --  TROPONINI 3.97*   Current Meds:    . aspirin EC  81 mg Oral Daily  . atorvastatin  10 mg Oral q1800  . aztreonam  2 g Intravenous Q8H  . budesonide  0.5 mg Nebulization BID  . Chlorhexidine Gluconate Cloth  6 each Topical Q0600  . digoxin  0.125 mg Oral Daily  . enoxaparin (LOVENOX) injection  1 mg/kg Subcutaneous BID  . insulin aspart  0-15 Units Subcutaneous TID WC  . levofloxacin (LEVAQUIN) IV  750 mg Intravenous QHS  . metoprolol tartrate  75 mg Oral Q6H  . montelukast  10 mg Oral QHS  . mupirocin ointment  1 application Nasal BID  . pantoprazole  40 mg Oral Daily  . polyethylene glycol  17 g Oral Daily  . rOPINIRole  1 mg Oral BID  . sodium chloride  10-40 mL  Intracatheter Q12H  . sodium chloride  3 mL Intravenous Q12H  . sodium chloride  3 mL Intravenous Q12H  . sodium chloride      . traZODone  150 mg Oral QHS  . vancomycin  1,500 mg Intravenous Q12H     ASSESSMENT AND PLAN:   1. NSTEMI: Troponin elevated yesterday am. She has a history of CAD with prior CABG. LV function has worsened. Cardiac cath cancelled today secondary to elevated INR. (1.95). Coumadin is on hold. Continue lovenox for now. Will plan RHC and LHC tomorrow if INR less than 1.7. Continue beta blocker therapy   2. Atrial fibrillation: Ventricular rates 90-110. Continue metoprolol 75mg Q6 hours. Continue lovenox 1mg/kg Q12. Restart coumadin post cath. Will plan TEE/DCCV and initiation of antiarrhythmic medicine later this week.  Antiarrhythmic options would include either tikosyn or amiodarone. Given renal failure, tikosyn may not be the best option.   3. Acute on chronic systolic CHF: LVEF is 25% by echo here with significant LV enlargement suggesting that her LV dysfunction may be chronic. Will give 1 dose of lasix today. Will restart Ace-inhibitor tomorrow if creatinine stable.  RHC will be helpful   to guide volume management.   4. Acute renal failure: Creatinine improved.    5.  Microcytic anemia: Workup per primary team   6. AMS: work up and treatment per primary team   7. Recent CVA: Continue long term anticoagulation. Hold coumadin and bridge with lovenox for cath   Code status: presently full code   MCALHANY,CHRISTOPHER  12/30/20131:06 PM  

## 2012-03-30 NOTE — Progress Notes (Signed)
Active bleeding at right groin cath site.  Patient with excessive movement in bed when placing her on & off the bedpan.  Original dressing removed and manual pressure applied with sterile 4X4's.  Site redressed with no new bleeding noted thus far.  Ward Givens PA called made aware of events.  Order received to place foley catheter.  Patient agrees to insertion.

## 2012-03-30 NOTE — Interval H&P Note (Signed)
History and Physical Interval Note:  03/30/2012 4:30 PM  Caitlyn Hardy  has presented today for surgery, with the diagnosis of cp  The various methods of treatment have been discussed with the patient and family. After consideration of risks, benefits and other options for treatment, the patient has consented to  Procedure(s) (LRB) with comments: LEFT AND RIGHT HEART CATHETERIZATION WITH CORONARY/GRAFT ANGIOGRAM (N/A) as a surgical intervention .  The patient's history has been reviewed, patient examined, no change in status, stable for surgery.  I have reviewed the patient's chart and labs.  Questions were answered to the patient's satisfaction.     Tonny Bollman

## 2012-03-30 NOTE — Progress Notes (Signed)
ANTICOAGULATION CONSULT NOTE - Follow Up Consult  Pharmacy Consult for Heparin Indication: Hx Afib (to resume 8 hours post sheath removal)  Allergies  Allergen Reactions  . Penicillins Anaphylaxis  . Azithromycin Hives and Rash  . Bactrim (Sulfamethoxazole W-Trimethoprim) Hives and Rash  . Sulfa Antibiotics Hives and Rash    Patient Measurements: Height: 5\' 6"  (167.6 cm) Weight: 315 lb 14.7 oz (143.3 kg) IBW/kg (Calculated) : 59.3  Heparin Dosing Weight: 95 kg  Vital Signs: Temp: 98.1 F (36.7 C) (12/31 1845) Temp src: Oral (12/31 1532) Pulse Rate: 143  (12/31 1614)  Labs:  Basename 03/30/12 0430 03/29/12 0733 03/29/12 0500 03/28/12 1510 03/28/12 0205 03/28/12 0203  HGB 9.7* -- 9.7* -- -- --  HCT 33.5* -- 33.1* -- 35.5* --  PLT 160 -- 171 -- 166 --  APTT -- -- -- -- -- --  LABPROT 19.1* 21.5* -- -- -- --  INR 1.66* 1.95* -- -- -- --  HEPARINUNFRC -- -- -- -- -- --  CREATININE 1.06 -- 1.07 1.18* -- --  CKTOTAL -- -- -- -- -- --  CKMB -- -- -- -- -- --  TROPONINI -- -- -- -- -- 3.97*    Estimated Creatinine Clearance: 76.6 ml/min (by C-G formula based on Cr of 1.06).   Assessment: 66 y.o. F to resume heparin 8 hours post sheath removal + warfarin this evening for hx of Afib while awaiting tentative TEE cardioversion on Jan 2nd. The patient's warfarin was held this admission and she was bridged with heparin first, then lovenox after pulling out her IV line. The patient now has a PICC line in place. Hgb/Hct/Plt stable. Sheath removed at 1715 this evening. No bleeding/hematoma noted from removal.   The patient is also noted to start amiodarone for rate control. Will go ahead and reduce digoxin dose by half given interaction. Noted MD plans to possibly stop digoxin soon to limit potential med toxicity.  The patient was not on warfarin at home however did received a couple of doses (7.5 mg) at [facility] prior to coming to [facility]. INR on admission to [facility] on 12/27 was 2.52 and has  since trended down to 1.66 today. Warfarin requirements will be reduced with amiodarone initiation -- will monitor for this.   Goal of Therapy:  Heparin level 0.3-0.7 units/ml Monitor platelets by anticoagulation protocol: Yes   Plan:  1. Initiate heparin at 1350 units/hr (13.5 ml/hr) starting at 0130 on 1/1 2. Warfarin 5 mg x 1 dose at 1800 today 3. Reduce digoxin to 0.0625 mg daily starting on 1/1 4. Daily heparin levels, PT/INR, CBC 5. Will continue to monitor for any signs/symptoms of bleeding and will follow up with heparin level in 8 hours after initiation and PT/INR in the a.m  Georgina Pillion, PharmD, BCPS Clinical Pharmacist Pager: [phone] 03/30/2012 7:32 PM

## 2012-03-30 NOTE — Progress Notes (Signed)
Noted pt wet and her foley out in the bed , peri care and bathing done pt voided 350cc amber color urine on bed pan refused to have foley reinserted says she is voiding okay without it

## 2012-03-31 LAB — GLUCOSE, CAPILLARY
Glucose-Capillary: 111 mg/dL — ABNORMAL HIGH (ref 70–99)
Glucose-Capillary: 103 mg/dL — ABNORMAL HIGH (ref 70–99)

## 2012-03-31 LAB — CBC
HCT: 31.2 % — ABNORMAL LOW (ref 36.0–46.0)
Hemoglobin: 9.2 g/dL — ABNORMAL LOW (ref 12.0–15.0)
RBC: 4.29 MIL/uL (ref 3.87–5.11)

## 2012-03-31 LAB — HEPARIN LEVEL (UNFRACTIONATED): Heparin Unfractionated: 1.3 IU/mL — ABNORMAL HIGH (ref 0.30–0.70)

## 2012-03-31 LAB — BASIC METABOLIC PANEL
Calcium: 7.8 mg/dL — ABNORMAL LOW (ref 8.4–10.5)
Creatinine, Ser: 0.97 mg/dL (ref 0.50–1.10)
GFR calc non Af Amer: 60 mL/min — ABNORMAL LOW (ref >90)
Sodium: 142 mEq/L (ref 135–145)

## 2012-03-31 MED ORDER — SODIUM CHLORIDE 0.9 % IV SOLN
250.0000 mL | INTRAVENOUS | Status: DC
  Administered 2012-03-31: 250 mL via INTRAVENOUS

## 2012-03-31 MED ORDER — ACYCLOVIR 800 MG PO TABS
800.0000 mg | ORAL_TABLET | Freq: Every day | ORAL | Status: AC
  Administered 2012-03-31 – 2012-04-07 (×33): 800 mg via ORAL
  Filled 2012-03-31 (×35): qty 1

## 2012-03-31 MED ORDER — SODIUM CHLORIDE 0.9 % IJ SOLN: 3.0000 mL | INTRAMUSCULAR | Status: DC | PRN

## 2012-03-31 MED ORDER — HEPARIN (PORCINE) IN NACL 100-0.45 UNIT/ML-% IJ SOLN
1000.0000 [IU]/h | INTRAMUSCULAR | Status: DC
  Administered 2012-03-31: 1000 [IU]/h via INTRAVENOUS
  Filled 2012-03-31: qty 250

## 2012-03-31 MED ORDER — WARFARIN SODIUM 5 MG PO TABS
5.0000 mg | ORAL_TABLET | Freq: Once | ORAL | Status: AC
  Administered 2012-03-31: 5 mg via ORAL
  Filled 2012-03-31: qty 1

## 2012-03-31 MED ORDER — LEVOFLOXACIN 750 MG PO TABS
750.0000 mg | ORAL_TABLET | Freq: Every day | ORAL | Status: DC
  Administered 2012-03-31 – 2012-04-01 (×2): 750 mg via ORAL
  Filled 2012-03-31 (×3): qty 1

## 2012-03-31 MED ORDER — HEPARIN (PORCINE) IN NACL 100-0.45 UNIT/ML-% IJ SOLN
600.0000 [IU]/h | INTRAMUSCULAR | Status: DC
  Administered 2012-04-01: 600 [IU]/h via INTRAVENOUS
  Filled 2012-03-31 (×2): qty 250

## 2012-03-31 MED ORDER — ACYCLOVIR 200 MG PO CAPS
800.0000 mg | ORAL_CAPSULE | Freq: Every day | ORAL | Status: DC
  Filled 2012-03-31 (×5): qty 4

## 2012-03-31 MED ORDER — HYDROCORTISONE 1 % EX CREA: 1.0000 "application " | TOPICAL_CREAM | Freq: Three times a day (TID) | CUTANEOUS | Status: DC | PRN

## 2012-03-31 MED ORDER — POTASSIUM CHLORIDE 10 MEQ/50ML IV SOLN
10.0000 meq | INTRAVENOUS | Status: AC
  Administered 2012-03-31 (×4): 10 meq via INTRAVENOUS
  Filled 2012-03-31: qty 200

## 2012-03-31 MED ORDER — SODIUM CHLORIDE 0.9 % IJ SOLN
3.0000 mL | Freq: Two times a day (BID) | INTRAMUSCULAR | Status: DC
  Administered 2012-04-01: 3 mL via INTRAVENOUS

## 2012-03-31 MED ORDER — POTASSIUM CHLORIDE CRYS ER 20 MEQ PO TBCR
20.0000 meq | EXTENDED_RELEASE_TABLET | Freq: Two times a day (BID) | ORAL | Status: DC
  Administered 2012-03-31 – 2012-04-03 (×7): 20 meq via ORAL
  Filled 2012-03-31 (×8): qty 1

## 2012-03-31 MED ORDER — FUROSEMIDE 10 MG/ML IJ SOLN
80.0000 mg | Freq: Two times a day (BID) | INTRAMUSCULAR | Status: DC
  Administered 2012-03-31 – 2012-04-03 (×6): 80 mg via INTRAVENOUS
  Filled 2012-03-31 (×8): qty 8

## 2012-03-31 MED ORDER — SODIUM CHLORIDE 0.9 % IV SOLN
INTRAVENOUS | Status: DC | PRN
  Administered 2012-04-03: 22:00:00 via INTRAVENOUS
  Administered 2012-04-07: 10 mL/h via INTRAVENOUS

## 2012-03-31 NOTE — Progress Notes (Signed)
ANTICOAGULATION CONSULT NOTE - Follow Up Consult  Pharmacy Consult for Heparin Indication: Hx Afib (to resume 8 hours post sheath removal)  Allergies  Allergen Reactions  . Penicillins Anaphylaxis  . Azithromycin Hives and Rash  . Bactrim (Sulfamethoxazole W-Trimethoprim) Hives and Rash  . Sulfa Antibiotics Hives and Rash    Patient Measurements: Height: 5\' 6"  (167.6 cm) Weight: 316 lb 5.8 oz (143.5 kg) IBW/kg (Calculated) : 59.3  Heparin Dosing Weight: 95 kg  Vital Signs: Temp: 98.5 F (36.9 C) (01/01 1037) Temp src: Oral (01/01 1037) BP: 145/66 mmHg (01/01 1000) Pulse Rate: 94  (01/01 1000)  Labs:  Basename 03/31/12 0800 03/31/12 0129 03/30/12 0430 03/29/12 0733 03/29/12 0500  HGB -- 9.2* 9.7* -- --  HCT -- 31.2* 33.5* -- 33.1*  PLT -- 138* 160 -- 171  APTT -- -- -- -- --  LABPROT 20.6* -- 19.1* 21.5* --  INR 1.84* -- 1.66* 1.95* --  HEPARINUNFRC 1.30* -- -- -- --  CREATININE -- 0.97 1.06 -- 1.07  CKTOTAL -- -- -- -- --  CKMB -- -- -- -- --  TROPONINI -- -- -- -- --    Estimated Creatinine Clearance: 83.8 ml/min (by C-G formula based on Cr of 0.97).   Assessment: 67 y.o. F to resume heparin 8 hours post sheath removal + warfarin this evening for hx of Afib RVR started on Amiodarone 12/31 pm, awaiting tentative TEE/DCCV on Jan 2nd. The patient's warfarin was held this admission for cath and she was bridged with heparin first, then lovenox after pulling out her IV line - last dose 12/30 at 2200. The patient now has a PICC line in place. Hgb/Hct/Plt stable. Sheath removed at 1715 this evening. Slight bleeding from groin last pm improved today. Heparin level 1.3 > goal 0.3-0.7 on heparin drip 1300 uts/hr.    The patient was not on warfarin at home however did received a couple of doses (7.5 mg) at [facility] prior to coming to [facility]. INR on admission to [facility] on 12/27 was 2.52 and has since trended down to 1.66 12/31 > 1.85 1/1 after Coumadin 5mg  x1 last pm. Warfarin requirements  will be reduced with amiodarone initiation.    Goal of Therapy:  Heparin level 0.3-0.7 units/ml Monitor platelets by anticoagulation protocol: Yes  INR 2-3  Plan:  1. Hold heparin drip x1 hr  2. Restart heparin drip rate 1000 uts/hr 3.  Check HL 6hr after restart  4. Warfarin 5 mg x 1 dose at 1800 today 5. Daily heparin levels, PT/INR, CBC  Leota Sauers Pharm.D. CPP, BCPS Clinical Pharmacist [phone] 03/31/2012 10:52 AM

## 2012-03-31 NOTE — Progress Notes (Signed)
INR this am 1.84. Pt on coumadin.

## 2012-03-31 NOTE — Progress Notes (Signed)
[facility] Progress Note [place] TEAM 1 - Stepdown/ICU TEAM  Spoke w/ Dr. Excell Seltzer w/ [facility].  He has offered to assume care of the pt.  I agree this would be appropriate.  Will sign off.  Thanks to [employer] for their exceptional care.  Lonia Blood, MD [facility] Office  [phone] Pager [phone]  On-Call/Text Page:      [email or web address]      password [facility]

## 2012-03-31 NOTE — Progress Notes (Addendum)
    Subjective:  Events overnight noted - confusion, groin oozing/bleeding. Pt without dyspnea at rest but dyspneic with minimal exertion. No chest pain.  Objective:  Vital Signs in the last 24 hours: Temp:  [97.8 F (36.6 C)-98.4 F (36.9 C)] 97.8 F (36.6 C) (01/01 0800) Pulse Rate:  [40-162] 74  (01/01 0700) Resp:  [12-28] 14  (01/01 0700) BP: (107-139)/(47-97) 135/75 mmHg (01/01 0700) SpO2:  [93 %-100 %] 98 % (01/01 0700) Weight:  [143.5 kg (316 lb 5.8 oz)] 143.5 kg (316 lb 5.8 oz) (01/01 0400)  Intake/Output from previous day: 12/31 0701 - 01/01 0700 In: 2065.5 [P.O.:300; I.V.:611.5; IV Piggyback:1154] Out: 2265 [Urine:2265]  Physical Exam: Pt is alert and oriented, morbidly obese woman in NAD HEENT: normal Neck: JVP - markedly elevated, carotids 2+= without bruits Lungs: CTA bilaterally CV: irregularly irregular without murmur or gallop Abd: soft, NT, obese Ext: 2+ pretibial edema, right groin ecchymosis without hematoma or tenderness Skin: warm/dry no rash, diffuse ecchymoses noted.  Lab Results:  Basename 03/31/12 0129 03/30/12 0430  WBC 6.1 6.8  HGB 9.2* 9.7*  PLT 138* 160    Basename 03/31/12 0129 03/30/12 0430  NA 142 138  K 3.5 4.2  CL 108 107  CO2 22 25  GLUCOSE 126* 99  BUN 24* 31*  CREATININE 0.97 1.06   No results found for this basename: TROPONINI:2,CK,MB:2 in the last 72 hours  Cardiac Studies: No new studies  Tele: Atrial fibrillation HR 100-120, short episodes of NSVT noted (5-6 beats)  Assessment/Plan:  1. Acute on chronic systolic CHF, decompensated NYHA Class 4 2. Atrial fib with RVR, HR difficult to control 3. NSVT 4. 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 Acute renal failure, resolved 6. Recent stroke  Hemodynamics from cardiac cath yesterday demonstrate poor prognostic signs of low CI and high RA pressure. Will continue IV diuresis with an increased dose of furosemide today. Replete K  in setting of NSVT. Continue IV amiodarone, metoprolol Q 6 hours with plans to transition to carvedilol after HR better controlled. EKG reviewed and shows AF without evidence of QT prolongation. I am going to stop digoxin. Cath results noted and she is well-revascularized with continued patency of grafts and stent sites.  Plan TEE/cardioversion tomorrow. I have discussed risks, indication, and alternatives of this plan with the patient who understands. After cardioversion, she may benefit from addition of an inotrope (milrinone) depending on her response to increased diuretic today.  Overall prognosis remains guarded. I have discussed this with her son, Casimiro Needle, who is an EMT. Considering her advanced CHF and multiple cardiac problems, will take her onto our service for continued management. Discussed with Dr Sharon Seller and appreciate the care of the hospitalist team.  Tonny Bollman, M.D. 03/31/2012, 8:41 AM

## 2012-03-31 NOTE — Progress Notes (Signed)
Upon am assessment at 0745 pt appeared confused asking staff to "get out of my house." Pt reoriented and appears to clear up mentally with reorientation and discussion of pt situation. Pt also appears to have shingles on left buttocks. Pt alert and oriented at this time. Drs Sharon Seller and Excell Seltzer notified and aware.

## 2012-03-31 NOTE — Progress Notes (Signed)
ANTICOAGULATION CONSULT NOTE - Follow Up Consult    HL = 1.13 (goal 0.3 - 0.7 units/mL) Heparin weight = 95 kg   Assessment: 66 YOF s/p cath to continue on IV heparin for history of Afib.  Heparin level remains supra-therapeutic despite adjustment made this morning.  Low suspicion for falsely elevated heparin level due to Lovenox therapy as it has been discontinued since 03/29/12 and Mrs. Ingber has good renal function.  No bleeding nor hematoma at groin site per RN.   Plan: - Hold heparin for approximately one hour, then - At 2000, resume heparin gtt at 600 units/hr - Check 6 hr HL     Allen Basista D. Laney Potash, PharmD, BCPS Pager:  [phone] 03/31/2012, 6:49 PM

## 2012-04-01 ENCOUNTER — Encounter (HOSPITAL_COMMUNITY): Payer: Self-pay | Admitting: Gastroenterology

## 2012-04-01 ENCOUNTER — Encounter (HOSPITAL_COMMUNITY)
Admission: AD | Disposition: A | Discharge status: Skilled Nursing Facility | Payer: Self-pay | Source: Other Acute Inpatient Hospital | Attending: Cardiovascular Disease

## 2012-04-01 DIAGNOSIS — I4891 Unspecified atrial fibrillation: Secondary | ICD-10-CM

## 2012-04-01 HISTORY — PX: TEE WITHOUT CARDIOVERSION: SHX5443

## 2012-04-01 LAB — COMPREHENSIVE METABOLIC PANEL
AST: 23 U/L (ref 0–37)
Albumin: 3.1 g/dL — ABNORMAL LOW (ref 3.5–5.2)
Alkaline Phosphatase: 71 U/L (ref 39–117)
BUN: 22 mg/dL (ref 6–23)
CO2: 25 mEq/L (ref 19–32)
Chloride: 102 mEq/L (ref 96–112)
Potassium: 3.9 mEq/L (ref 3.5–5.1)
Total Bilirubin: 0.5 mg/dL (ref 0.3–1.2)

## 2012-04-01 LAB — GLUCOSE, CAPILLARY
Glucose-Capillary: 118 mg/dL — ABNORMAL HIGH (ref 70–99)
Glucose-Capillary: 133 mg/dL — ABNORMAL HIGH (ref 70–99)

## 2012-04-01 LAB — CBC
HCT: 31.2 % — ABNORMAL LOW (ref 36.0–46.0)
Hemoglobin: 9.4 g/dL — ABNORMAL LOW (ref 12.0–15.0)
MCHC: 30.1 g/dL (ref 30.0–36.0)
MCV: 72.4 fL — ABNORMAL LOW (ref 78.0–100.0)

## 2012-04-01 LAB — PROTIME-INR
INR: 1.93 — ABNORMAL HIGH (ref 0.00–1.49)
Prothrombin Time: 21.3 seconds — ABNORMAL HIGH (ref 11.6–15.2)

## 2012-04-01 SURGERY — ECHOCARDIOGRAM, TRANSESOPHAGEAL
Anesthesia: Moderate Sedation

## 2012-04-01 MED ORDER — TOPIRAMATE 100 MG PO TABS
200.0000 mg | ORAL_TABLET | Freq: Every day | ORAL | Status: DC
  Filled 2012-04-01: qty 2

## 2012-04-01 MED ORDER — METFORMIN HCL 500 MG PO TABS
500.0000 mg | ORAL_TABLET | Freq: Two times a day (BID) | ORAL | Status: DC
  Filled 2012-04-01 (×2): qty 1

## 2012-04-01 MED ORDER — HYDROCODONE-ACETAMINOPHEN 10-325 MG PO TABS
1.0000 | ORAL_TABLET | Freq: Three times a day (TID) | ORAL | Status: DC | PRN
  Administered 2012-04-01 – 2012-04-19 (×19): 1 via ORAL
  Filled 2012-04-01 (×19): qty 1

## 2012-04-01 MED ORDER — SODIUM CHLORIDE 0.9 % IV SOLN: INTRAVENOUS | Status: DC

## 2012-04-01 MED ORDER — FUROSEMIDE 40 MG PO TABS: 40.0000 mg | ORAL_TABLET | Freq: Two times a day (BID) | ORAL | Status: DC

## 2012-04-01 MED ORDER — PANTOPRAZOLE SODIUM 40 MG PO TBEC: 40.0000 mg | DELAYED_RELEASE_TABLET | Freq: Every day | ORAL | Status: DC

## 2012-04-01 MED ORDER — ZOLPIDEM TARTRATE 5 MG PO TABS
10.0000 mg | ORAL_TABLET | Freq: Every evening | ORAL | Status: DC | PRN
  Administered 2012-04-02 – 2012-04-17 (×4): 10 mg via ORAL
  Filled 2012-04-01 (×2): qty 2
  Filled 2012-04-01: qty 1
  Filled 2012-04-01: qty 2
  Filled 2012-04-01: qty 1

## 2012-04-01 MED ORDER — FENTANYL CITRATE 0.05 MG/ML IJ SOLN
INTRAMUSCULAR | Status: AC
  Filled 2012-04-01: qty 2

## 2012-04-01 MED ORDER — FENTANYL CITRATE 0.05 MG/ML IJ SOLN
INTRAMUSCULAR | Status: DC | PRN
  Administered 2012-04-01: 25 ug via INTRAVENOUS
  Administered 2012-04-01: 50 ug via INTRAVENOUS

## 2012-04-01 MED ORDER — MIDAZOLAM HCL 5 MG/ML IJ SOLN
INTRAMUSCULAR | Status: AC
  Filled 2012-04-01: qty 2

## 2012-04-01 MED ORDER — PRASUGREL HCL 10 MG PO TABS: 10.0000 mg | ORAL_TABLET | Freq: Every day | ORAL | Status: DC

## 2012-04-01 MED ORDER — FLUTICASONE PROPIONATE 50 MCG/ACT NA SUSP
2.0000 | Freq: Every day | NASAL | Status: DC
  Filled 2012-04-01: qty 16

## 2012-04-01 MED ORDER — METOPROLOL TARTRATE 50 MG PO TABS
50.0000 mg | ORAL_TABLET | Freq: Two times a day (BID) | ORAL | Status: DC
  Filled 2012-04-01: qty 1

## 2012-04-01 MED ORDER — HYDROCODONE-ACETAMINOPHEN 10-500 MG PO TABS: 1.0000 | ORAL_TABLET | Freq: Three times a day (TID) | ORAL | Status: DC | PRN

## 2012-04-01 MED ORDER — CYCLOBENZAPRINE HCL 10 MG PO TABS: 10.0000 mg | ORAL_TABLET | Freq: Two times a day (BID) | ORAL | Status: DC | PRN

## 2012-04-01 MED ORDER — ASPIRIN EC 81 MG PO TBEC
81.0000 mg | DELAYED_RELEASE_TABLET | Freq: Every day | ORAL | Status: DC
  Filled 2012-04-01: qty 1

## 2012-04-01 MED ORDER — MECLIZINE HCL 25 MG PO TABS
25.0000 mg | ORAL_TABLET | Freq: Every day | ORAL | Status: DC
  Filled 2012-04-01: qty 1

## 2012-04-01 MED ORDER — BUTAMBEN-TETRACAINE-BENZOCAINE 2-2-14 % EX AERO
INHALATION_SPRAY | CUTANEOUS | Status: DC | PRN
  Administered 2012-04-01: 2 via TOPICAL

## 2012-04-01 MED ORDER — MIDAZOLAM HCL 10 MG/2ML IJ SOLN
INTRAMUSCULAR | Status: DC | PRN
  Administered 2012-04-01: 2 mg via INTRAVENOUS
  Administered 2012-04-01: 1 mg via INTRAVENOUS

## 2012-04-01 MED ORDER — ROPINIROLE HCL 1 MG PO TABS
1.0000 mg | ORAL_TABLET | Freq: Every day | ORAL | Status: DC
  Filled 2012-04-01: qty 2

## 2012-04-01 MED ORDER — ENALAPRIL MALEATE 20 MG PO TABS
20.0000 mg | ORAL_TABLET | Freq: Two times a day (BID) | ORAL | Status: DC
  Administered 2012-04-01 – 2012-04-04 (×6): 20 mg via ORAL
  Filled 2012-04-01 (×10): qty 1

## 2012-04-01 NOTE — Progress Notes (Signed)
ANTICOAGULATION CONSULT NOTE - Follow Up Consult  Pharmacy Consult for Heparin Indication: Hx Afib   Allergies  Allergen Reactions  . Penicillins Anaphylaxis  . Azithromycin Hives and Rash  . Bactrim (Sulfamethoxazole W-Trimethoprim) Hives and Rash  . Sulfa Antibiotics Hives and Rash    Patient Measurements: Height: 5\' 6"  (167.6 cm) Weight: 316 lb 5.8 oz (143.5 kg) IBW/kg (Calculated) : 59.3  Heparin Dosing Weight: 95 kg  Vital Signs: Temp: 97.7 F (36.5 C) (01/02 0000) Temp src: Oral (01/02 0000) BP: 132/64 mmHg (01/02 0300) Pulse Rate: 79  (01/02 0300)  Labs:  Basename 04/01/12 0251 03/31/12 2330 03/31/12 1800 03/31/12 0800 03/31/12 0129 03/30/12 0430 03/29/12 0733 03/29/12 0500  HGB -- -- -- -- 9.2* 9.7* -- --  HCT -- -- -- -- 31.2* 33.5* -- 33.1*  PLT -- -- -- -- 138* 160 -- 171  APTT -- -- -- -- -- -- -- --  LABPROT -- -- -- 20.6* -- 19.1* 21.5* --  INR -- -- -- 1.84* -- 1.66* 1.95* --  HEPARINUNFRC 0.66 -- 1.13* 1.30* -- -- -- --  CREATININE -- 1.09 -- -- 0.97 1.06 -- --  CKTOTAL -- -- -- -- -- -- -- --  CKMB -- -- -- -- -- -- -- --  TROPONINI -- -- -- -- -- -- -- --    Estimated Creatinine Clearance: 74.5 ml/min (by C-G formula based on Cr of 1.09).   Assessment: 68 y.o. Female with Afib for anticoagulation.   Goal of Therapy:  Heparin level 0.3-0.7 units/ml Monitor platelets by anticoagulation protocol: Yes   Plan:  Continue Heparin at current rate for now Recheck level this afternoon to ensure stays with therapeutic range.  Geannie Risen, PharmD, BCPS

## 2012-04-01 NOTE — H&P (View-Only) (Signed)
    Subjective:  Swelling improved. Breathing about the same, but a little more comfortable lying in bed. No chest pain.  Objective:  Vital Signs in the last 24 hours: Temp:  [97.7 F (36.5 C)-98.3 F (36.8 C)] 98 F (36.7 C) (01/02 0400) Pulse Rate:  [58-111] 101  (01/02 1100) Resp:  [16-26] 26  (01/02 1100) BP: (101-145)/(50-92) 128/79 mmHg (01/02 1100) SpO2:  [91 %-100 %] 96 % (01/02 1100) Weight:  [141.1 kg (311 lb 1.1 oz)] 141.1 kg (311 lb 1.1 oz) (01/02 0500)  Intake/Output from previous day: 01/01 0701 - 01/02 0700 In: 2017.1 [P.O.:725; I.V.:1080.1; IV Piggyback:212] Out: 4150 [Urine:4150]  Physical Exam: Pt is alert and oriented, obese woman in NAD HEENT: normal Neck: JVP - elevated, carotids 2+= without bruits Lungs: CTA bilaterally CV: irreg irreg without murmur or gallop Abd: soft, NT, Positive BS, no hepatomegaly Ext: 1+ bilateral pretibial edema, distal pulses intact and equal Skin: warm/dry with multiple areas of ecchymosis  Lab Results:  Basename 04/01/12 0430 03/31/12 0129  WBC 5.7 6.1  HGB 9.4* 9.2*  PLT 120* 138*    Basename 03/31/12 2330 03/31/12 0129  NA 136 142  K 3.9 3.5  CL 102 108  CO2 25 22  GLUCOSE 133* 126*  BUN 22 24*  CREATININE 1.09 0.97   No results found for this basename: TROPONINI:2,CK,MB:2 in the last 72 hours  Tele: Atrial fibrillation  Assessment/Plan:  1. Acute on chronic systolic CHF, decompensated NYHA Class 4  2. Atrial fib with RVR, rate a little better on IV amiodarone 3. NSVT  4. 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 Acute renal failure, resolved  6. Recent stroke  Pt making slow progress. She has diuresed 6L negative since admission, 2L last 24 hours. Continue same Rx. TEE/cardioversion today as per prior note. I have again reviewed risks and rationale for this with the patient and her son. They understand and agree to proceed. Repeat BMET in am. Continue heparin and  warfarin - INR approaching therapeutic range.  Reagan Klemz, M.D. 04/01/2012, 11:43 AM     

## 2012-04-01 NOTE — Progress Notes (Signed)
  Echocardiogram Echocardiogram Transesophageal has been performed.  Laroy Mustard FRANCES 04/01/2012, 5:33 PM

## 2012-04-01 NOTE — Progress Notes (Signed)
    Subjective:  Swelling improved. Breathing about the same, but a little more comfortable lying in bed. No chest pain.  Objective:  Vital Signs in the last 24 hours: Temp:  [97.7 F (36.5 C)-98.3 F (36.8 C)] 98 F (36.7 C) (01/02 0400) Pulse Rate:  [58-111] 101  (01/02 1100) Resp:  [16-26] 26  (01/02 1100) BP: (101-145)/(50-92) 128/79 mmHg (01/02 1100) SpO2:  [91 %-100 %] 96 % (01/02 1100) Weight:  [141.1 kg (311 lb 1.1 oz)] 141.1 kg (311 lb 1.1 oz) (01/02 0500)  Intake/Output from previous day: 01/01 0701 - 01/02 0700 In: 2017.1 [P.O.:725; I.V.:1080.1; IV Piggyback:212] Out: 4150 [Urine:4150]  Physical Exam: Pt is alert and oriented, obese woman in NAD HEENT: normal Neck: JVP - elevated, carotids 2+= without bruits Lungs: CTA bilaterally CV: irreg irreg without murmur or gallop Abd: soft, NT, Positive BS, no hepatomegaly Ext: 1+ bilateral pretibial edema, distal pulses intact and equal Skin: warm/dry with multiple areas of ecchymosis  Lab Results:  Basename 04/01/12 0430 03/31/12 0129  WBC 5.7 6.1  HGB 9.4* 9.2*  PLT 120* 138*    Basename 03/31/12 2330 03/31/12 0129  NA 136 142  K 3.9 3.5  CL 102 108  CO2 25 22  GLUCOSE 133* 126*  BUN 22 24*  CREATININE 1.09 0.97   No results found for this basename: TROPONINI:2,CK,MB:2 in the last 72 hours  Tele: Atrial fibrillation  Assessment/Plan:  1. Acute on chronic systolic CHF, decompensated NYHA Class 4  2. Atrial fib with RVR, rate a little better on IV amiodarone 3. NSVT  4. 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 Acute renal failure, resolved  6. Recent stroke  Pt making slow progress. She has diuresed 6L negative since admission, 2L last 24 hours. Continue same Rx. TEE/cardioversion today as per prior note. I have again reviewed risks and rationale for this with the patient and her son. They understand and agree to proceed. Repeat BMET in am. Continue heparin and  warfarin - INR approaching therapeutic range.  Tonny Bollman, M.D. 04/01/2012, 11:43 AM

## 2012-04-01 NOTE — CV Procedure (Signed)
    Transesophageal Echocardiogram Note  Caitlyn Hardy [id number] [birth year]  Procedure: Transesophageal Echocardiogram Indications: atrial fib  Procedure Details Consent: Obtained Time Out: Verified patient identification, verified procedure, site/side was marked, verified correct patient position, special equipment/implants available, Radiology Safety Procedures followed,  medications/allergies/relevent history reviewed, required imaging and test results available.  Performed  Medications: Fentanyl: 75 mcg IV Versed: 3 mg IV  Left Ventrical:  Severe LV dysfunction - EF 20%  Mitral Valve: moderate MR  Aortic Valve: mild calcification, no AS or AI  Tricuspid Valve: severe TR, peak gradient 71 mm Hg  Pulmonic Valve: no significant PI  Left Atrium/ Left atrial appendage: there appears to be a thrombus in the tip of the left atrial appendage.  Atrial septum: no PFO by color doppler  Aorta: normal    Complications: No apparent complications Patient did tolerate procedure well.  There appears to be a small thrombus in the tip of the atrial appendage. It was quite difficult to image the atrial appendage. Given that information I do not think that we should proceed with cardioversion. She should have a total of 4 weeks of therapeutic anticoagulation prior to attempted cardioversion. We'll need to continue with rate control and anticoagulation for now.  Vesta Mixer, Montez Hageman., MD, [facility] 04/01/2012, 4:05 PM

## 2012-04-01 NOTE — Interval H&P Note (Signed)
History and Physical Interval Note:  04/01/2012 3:25 PM  Caitlyn Hardy  has presented today for surgery, with the diagnosis of afib  The various methods of treatment have been discussed with the patient and family. After consideration of risks, benefits and other options for treatment, the patient has consented to  Procedure(s) (LRB) with comments: TRANSESOPHAGEAL ECHOCARDIOGRAM (TEE) (N/A) as a surgical intervention .  The patient's history has been reviewed, patient examined, no change in status, stable for surgery.  I have reviewed the patient's chart and labs.  Questions were answered to the patient's satisfaction.     Elyn Aquas.

## 2012-04-02 ENCOUNTER — Encounter (HOSPITAL_COMMUNITY): Payer: Self-pay | Admitting: Cardiovascular Disease

## 2012-04-02 LAB — HEPARIN LEVEL (UNFRACTIONATED): Heparin Unfractionated: 0.37 IU/mL (ref 0.30–0.70)

## 2012-04-02 LAB — PROTIME-INR: Prothrombin Time: 23.2 seconds — ABNORMAL HIGH (ref 11.6–15.2)

## 2012-04-02 LAB — BASIC METABOLIC PANEL
CO2: 26 mEq/L (ref 19–32)
Calcium: 8.3 mg/dL — ABNORMAL LOW (ref 8.4–10.5)
GFR calc Af Amer: 53 mL/min — ABNORMAL LOW (ref >90)
GFR calc non Af Amer: 46 mL/min — ABNORMAL LOW (ref >90)
Sodium: 136 mEq/L (ref 135–145)

## 2012-04-02 LAB — CBC
HCT: 29.3 % — ABNORMAL LOW (ref 36.0–46.0)
Hemoglobin: 8.7 g/dL — ABNORMAL LOW (ref 12.0–15.0)
MCH: 21.5 pg — ABNORMAL LOW (ref 26.0–34.0)
MCV: 72.5 fL — ABNORMAL LOW (ref 78.0–100.0)
RBC: 4.04 MIL/uL (ref 3.87–5.11)

## 2012-04-02 LAB — GLUCOSE, CAPILLARY: Glucose-Capillary: 102 mg/dL — ABNORMAL HIGH (ref 70–99)

## 2012-04-02 MED ORDER — WARFARIN SODIUM 3 MG PO TABS
3.0000 mg | ORAL_TABLET | Freq: Once | ORAL | Status: AC
  Administered 2012-04-02: 3 mg via ORAL
  Filled 2012-04-02: qty 1

## 2012-04-02 MED ORDER — METOPROLOL TARTRATE 100 MG PO TABS
100.0000 mg | ORAL_TABLET | Freq: Three times a day (TID) | ORAL | Status: DC
  Administered 2012-04-02 (×2): 100 mg via ORAL
  Filled 2012-04-02 (×5): qty 1

## 2012-04-02 MED ORDER — AMIODARONE HCL 200 MG PO TABS
400.0000 mg | ORAL_TABLET | Freq: Two times a day (BID) | ORAL | Status: DC
  Administered 2012-04-02 – 2012-04-03 (×2): 400 mg via ORAL
  Filled 2012-04-02 (×3): qty 2

## 2012-04-02 NOTE — Progress Notes (Addendum)
ANTICOAGULATION CONSULT NOTE - Follow Up Consult  Pharmacy Consult for Warfarin  Indication: Hx Afib   Allergies  Allergen Reactions  . Penicillins Anaphylaxis  . Azithromycin Hives and Rash  . Bactrim (Sulfamethoxazole W-Trimethoprim) Hives and Rash  . Sulfa Antibiotics Hives and Rash   Patient Measurements: Height: 5\' 6"  (167.6 cm) Weight: 295 lb 3.1 oz (133.9 kg) IBW/kg (Calculated) : 59.3  Heparin Dosing Weight: 95 kg  Vital Signs: Temp: 98.5 F (36.9 C) (01/03 1100) Temp src: Oral (01/03 1100) BP: 107/62 mmHg (01/03 1100) Pulse Rate: 66  (01/03 1000)  Labs:  Basename 04/02/12 0435 04/01/12 1400 04/01/12 0430 04/01/12 0251 03/31/12 2330 03/31/12 0800 03/31/12 0129  HGB 8.7* -- 9.4* -- -- -- --  HCT 29.3* -- 31.2* -- -- -- 31.2*  PLT 98* -- 120* -- -- -- 138*  APTT -- -- -- -- -- -- --  LABPROT 23.2* -- 21.3* -- -- 20.6* --  INR 2.16* -- 1.93* -- -- 1.84* --  HEPARINUNFRC 0.37 0.45 -- 0.66 -- -- --  CREATININE 1.21* -- -- -- 1.09 -- 0.97  CKTOTAL -- -- -- -- -- -- --  CKMB -- -- -- -- -- -- --  TROPONINI -- -- -- -- -- -- --   Estimated Creatinine Clearance: 64.3 ml/min (by C-G formula based on Cr of 1.21).  Assessment: 67 y.o. Female with Afib for anticoagulation.  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  Lovenox 145 mg given on 03/29/2012 and then this was discontinued.  She wars started on IV heparin on 03/31/2012 at 1350 units/hr with supra-therapeutic level.  Last rate of 600 units/hr maintained her within the therapeutic range of 0.3-0.7.  She has received Warfarin 5mg  x 1 on 12/31 and 5mg  x 1 on 1/1 and her INR bumped a lot to 1.93.  She did not receive a dose yesterday and today her INR is 2.16.  She is without noted bleeding complications but H/H is trending down and platelets have dropped significantly (166>>>[id number]).  Goal of Therapy:  INR 2.0-3.0 Monitor platelets by anticoagulation protocol: Yes   Plan:  Warfarin 3 mg x 1 Daily PT/INR Monitor  for bleeding complications with thrombocytopenia.  Nadara Mustard, PharmD., MS Clinical Pharmacist Pager:  [phone] Thank you for allowing pharmacy to be part of this patients care team.

## 2012-04-02 NOTE — Progress Notes (Addendum)
    Subjective:  No chest pain. Complains of back pain this am. Dyspnea with activity. Notes swelling improved.  Objective:  Vital Signs in the last 24 hours: Temp:  [97 F (36.1 C)-98.2 F (36.8 C)] 97 F (36.1 C) (01/03 0400) Pulse Rate:  [52-149] 81  (01/03 0700) Resp:  [11-29] 13  (01/03 0700) BP: (77-174)/(38-117) 110/58 mmHg (01/03 0700) SpO2:  [91 %-100 %] 96 % (01/03 0700) Weight:  [133.9 kg (295 lb 3.1 oz)] 133.9 kg (295 lb 3.1 oz) (01/03 0500)  Intake/Output from previous day: 01/02 0701 - 01/03 0700 In: 932.1 [I.V.:932.1] Out: 3350 [Urine:3350]  Physical Exam: Pt is alert and oriented, morbidly obese woman in NAD HEENT: normal Neck: JVP - elevated, carotids 2+= without bruits Lungs: CTA bilaterally CV: irregularly irregular without murmur or gallop Abd: soft, NT, Positive BS, no hepatomegaly Ext: edema improved, distal pulses intact and equal Skin: warm/dry no rash  Lab Results:  Basename 04/02/12 0435 04/01/12 0430  WBC 4.8 5.7  HGB 8.7* 9.4*  PLT 98* 120*    Basename 04/02/12 0435 03/31/12 2330  NA 136 136  K 3.6 3.9  CL 101 102  CO2 26 25  GLUCOSE 152* 133*  BUN 24* 22  CREATININE 1.21* 1.09   No results found for this basename: TROPONINI:2,CK,MB:2 in the last 72 hours  Cardiac Studies: TEE - findings noted  Tele: Atrial fibrillation heart rate 90-120, short runs of NSVT.  Assessment/Plan:  1. Acute on chronic systolic CHF - Will continue medical therapy with combination of metoprolol (used because of atrial fib with elevated ventricular rate), enalapril, and IV furosemide. Consolidate metoprolol to 100 mg TID. She is another 2.5 liters negative last 24 hours. Creatinine starting to trend up a little, but I think we should diurese another 24 hours. Probably switch to PO lasix tomorrow and consider addition of aldactone at low dose.  2. CAD s/p CABG and PCI. Grafts and stents patent at cath. Considering thrombocytopenia and systemic  anticoagulation, I think we should stop ASA.  3. Atrial Fibrillation - unable to cardiovert secondary to LAA thrombus. Rate control another 30 days then DCCV as outpatient. Change amio to PO. Continue lopressor at high dose. Dig has been stopped. INR 2.16 today - will stop heparin.  4. Morbid obesity/deconditioning. Will need SNF at discharge, suspect early next week. Will ask case manager to begin process.  5. Recent stroke. Continue coumadin.  6. Chronic pain.  prn analgesics are written.  7. Antibiotics. On levaquin for pneumonia. Has been on broad spectrum ABX. Suspect presentation is related completely to CHF. Will stop ABX.  Dispo: Transfer stepdown today. anticipate SNF early next week.  Tonny Bollman, M.D. 04/02/2012, 8:02 AM

## 2012-04-02 NOTE — Progress Notes (Signed)
Report obtained from [place] [employer] fund[facility].  Pt transferred to room 2928.  Pt denies pain at this time.  No s/s of any acute distress.  Call bell in reach.

## 2012-04-03 ENCOUNTER — Inpatient Hospital Stay (HOSPITAL_COMMUNITY): Payer: Medicare Other

## 2012-04-03 LAB — MAGNESIUM: Magnesium: 1.8 mg/dL (ref 1.5–2.5)

## 2012-04-03 LAB — CBC
MCH: 21.8 pg — ABNORMAL LOW (ref 26.0–34.0)
MCHC: 30.2 g/dL (ref 30.0–36.0)
Platelets: 91 10*3/uL — ABNORMAL LOW (ref 150–400)
RBC: 4.13 MIL/uL (ref 3.87–5.11)
RDW: 20.7 % — ABNORMAL HIGH (ref 11.5–15.5)

## 2012-04-03 LAB — BASIC METABOLIC PANEL
BUN: 24 mg/dL — ABNORMAL HIGH (ref 6–23)
Calcium: 8.6 mg/dL (ref 8.4–10.5)
Chloride: 98 mEq/L (ref 96–112)
Creatinine, Ser: 1.32 mg/dL — ABNORMAL HIGH (ref 0.50–1.10)
GFR calc Af Amer: 48 mL/min — ABNORMAL LOW (ref >90)
GFR calc Af Amer: 48 mL/min — ABNORMAL LOW (ref >90)
GFR calc non Af Amer: 41 mL/min — ABNORMAL LOW (ref >90)
Glucose, Bld: 110 mg/dL — ABNORMAL HIGH (ref 70–99)
Potassium: 3.8 mEq/L (ref 3.5–5.1)
Sodium: 136 mEq/L (ref 135–145)

## 2012-04-03 LAB — GLUCOSE, CAPILLARY
Glucose-Capillary: 96 mg/dL (ref 70–99)
Glucose-Capillary: 108 mg/dL — ABNORMAL HIGH (ref 70–99)
Glucose-Capillary: 135 mg/dL — ABNORMAL HIGH (ref 70–99)

## 2012-04-03 LAB — PROTIME-INR: Prothrombin Time: 21 seconds — ABNORMAL HIGH (ref 11.6–15.2)

## 2012-04-03 LAB — LACTIC ACID, PLASMA: Lactic Acid, Venous: 1.9 mmol/L (ref 0.5–2.2)

## 2012-04-03 MED ORDER — ONDANSETRON HCL 4 MG/2ML IJ SOLN: 4.0000 mg | Freq: Four times a day (QID) | INTRAMUSCULAR | Status: DC | PRN

## 2012-04-03 MED ORDER — AMIODARONE LOAD VIA INFUSION
150.0000 mg | Freq: Once | INTRAVENOUS | Status: DC
  Filled 2012-04-03: qty 83.34

## 2012-04-03 MED ORDER — ROPINIROLE HCL 1 MG PO TABS
2.0000 mg | ORAL_TABLET | Freq: Every day | ORAL | Status: DC
  Administered 2012-04-04 – 2012-04-18 (×15): 2 mg via ORAL
  Filled 2012-04-03 (×16): qty 2

## 2012-04-03 MED ORDER — WARFARIN SODIUM 3 MG PO TABS
3.0000 mg | ORAL_TABLET | Freq: Once | ORAL | Status: AC
  Administered 2012-04-03: 3 mg via ORAL
  Filled 2012-04-03: qty 1

## 2012-04-03 MED ORDER — METOPROLOL TARTRATE 1 MG/ML IV SOLN
5.0000 mg | Freq: Three times a day (TID) | INTRAVENOUS | Status: DC
  Administered 2012-04-03 – 2012-04-04 (×2): 5 mg via INTRAVENOUS
  Filled 2012-04-03 (×5): qty 5

## 2012-04-03 MED ORDER — NITROGLYCERIN 0.4 MG SL SUBL
SUBLINGUAL_TABLET | SUBLINGUAL | Status: AC
  Filled 2012-04-03: qty 25

## 2012-04-03 MED ORDER — FUROSEMIDE 80 MG PO TABS
80.0000 mg | ORAL_TABLET | Freq: Two times a day (BID) | ORAL | Status: DC
  Administered 2012-04-03 – 2012-04-10 (×15): 80 mg via ORAL
  Filled 2012-04-03 (×20): qty 1

## 2012-04-03 MED ORDER — METOPROLOL TARTRATE 50 MG PO TABS
50.0000 mg | ORAL_TABLET | Freq: Three times a day (TID) | ORAL | Status: DC
  Filled 2012-04-03 (×2): qty 1

## 2012-04-03 MED ORDER — POTASSIUM CHLORIDE CRYS ER 20 MEQ PO TBCR
20.0000 meq | EXTENDED_RELEASE_TABLET | Freq: Three times a day (TID) | ORAL | Status: DC
  Administered 2012-04-03 – 2012-04-09 (×17): 20 meq via ORAL
  Filled 2012-04-03 (×20): qty 1

## 2012-04-03 MED ORDER — AMIODARONE HCL IN DEXTROSE 360-4.14 MG/200ML-% IV SOLN
30.0000 mg/h | INTRAVENOUS | Status: DC
  Administered 2012-04-03 – 2012-04-04 (×2): 30 mg/h via INTRAVENOUS
  Filled 2012-04-03 (×4): qty 200

## 2012-04-03 MED ORDER — AMIODARONE LOAD VIA INFUSION
150.0000 mg | Freq: Once | INTRAVENOUS | Status: AC
  Administered 2012-04-03: 150 mg via INTRAVENOUS

## 2012-04-03 MED ORDER — ONDANSETRON HCL 4 MG/2ML IJ SOLN
INTRAMUSCULAR | Status: AC
  Administered 2012-04-03: 18:00:00
  Filled 2012-04-03: qty 2

## 2012-04-03 MED ORDER — METOPROLOL TARTRATE 50 MG PO TABS
50.0000 mg | ORAL_TABLET | Freq: Three times a day (TID) | ORAL | Status: DC
  Filled 2012-04-03: qty 1

## 2012-04-03 NOTE — Progress Notes (Signed)
ANTICOAGULATION CONSULT NOTE - Follow Up Consult  Pharmacy Consult for warfarin Indication: atrial fibrillation  Allergies  Allergen Reactions  . Penicillins Anaphylaxis  . Azithromycin Hives and Rash  . Bactrim (Sulfamethoxazole W-Trimethoprim) Hives and Rash  . Sulfa Antibiotics Hives and Rash    Patient Measurements: Height: 5\' 6"  (167.6 cm) Weight: 305 lb 8.9 oz (138.6 kg) IBW/kg (Calculated) : 59.3    Vital Signs: Temp: 97.7 F (36.5 C) (01/04 0731) Temp src: Oral (01/04 0731) BP: 101/72 mmHg (01/04 0730) Pulse Rate: 102  (01/04 0731)  Labs:  Basename 04/03/12 0500 04/02/12 0435 04/01/12 1400 04/01/12 0430 04/01/12 0251 03/31/12 2330  HGB 9.0* 8.7* -- -- -- --  HCT 29.8* 29.3* -- 31.2* -- --  PLT 91* 98* -- 120* -- --  APTT -- -- -- -- -- --  LABPROT 21.0* 23.2* -- 21.3* -- --  INR 1.89* 2.16* -- 1.93* -- --  HEPARINUNFRC -- 0.37 0.45 -- 0.66 --  CREATININE 1.32* 1.21* -- -- -- 1.09  CKTOTAL -- -- -- -- -- --  CKMB -- -- -- -- -- --  TROPONINI -- -- -- -- -- --    Estimated Creatinine Clearance: 60.2 ml/min (by C-G formula based on Cr of 1.32).   Medications:  Scheduled:    . acyclovir  800 mg Oral 5 X Daily  . amiodarone  400 mg Oral BID  . atorvastatin  10 mg Oral q1800  . budesonide  0.5 mg Nebulization BID  . enalapril  20 mg Oral BID  . furosemide  80 mg Intravenous BID  . insulin aspart  0-15 Units Subcutaneous TID WC  . metoprolol tartrate  100 mg Oral TID  . montelukast  10 mg Oral QHS  . polyethylene glycol  17 g Oral Daily  . potassium chloride  20 mEq Oral BID  . rOPINIRole  1 mg Oral BID  . traZODone  150 mg Oral QHS  . [COMPLETED] warfarin  3 mg Oral ONCE-1800  . warfarin  3 mg Oral ONCE-1800  . Warfarin - Pharmacist Dosing Inpatient   Does not apply q1800  . [DISCONTINUED] aspirin EC  81 mg Oral Daily  . [DISCONTINUED] levofloxacin  750 mg Oral Daily  . [DISCONTINUED] metoprolol tartrate  75 mg Oral Q6H    Assessment: 67 y.o.  female with new onset Afib on coumadin per pharmacy.Also noted recent history of CVA. She has received Warfarin 5mg  x 1 on 12/31 and 5mg  x 1 on 1/1 and her INR increased significantly therefore dose was held 1/2. Today's INR 1.89. No noted bleeding, CBC low but stable. PLT's trending down 120 > 91.   Goal of Therapy:  INR 2-3 Monitor platelets by anticoagulation protocol: Yes   Plan:  1. Coumadin 3 mg x 1 2. F/u INR in AM 3. Monitor s/sx of bleeding  Bola A. Wandra Feinstein D Clinical Pharmacist [phone] Phone [phone] 04/03/2012 7:59 AM

## 2012-04-03 NOTE — Progress Notes (Signed)
Patient Name: Caitlyn Hardy Date of Encounter: 04/03/2012   Principal Problem:  *Atrial fibrillation with RVR Active Problems:  CVA (cerebral infarction)  CHF (congestive heart failure)  CAD (coronary artery disease)  Renal failure  Acute on chronic systolic heart failure  Anemia  NSTEMI (non-ST elevated myocardial infarction)   SUBJECTIVE  CTSP secondary to acute alteration in mental status with slurring of speech, nausea, dry heaving, complaints of chest discomfort, and reduced responsiveness.  Per family friend who is at pts bedside, pt and friend were having a conversation and then pt began slurring her speech and reported feeling funny.  This was then followed by reduced responsiveness, nausea, and complaint of chest discomfort.  In this setting, she became more tachycardic and mildly hypertensive.  Currently she's having trouble following commands, lying in bed with her eyes closed and grunting.  She is able to move her toes bilat and her right hand.  She does report chest discomfort, which is apparently worse with palpation.  CURRENT MEDS    . nitroGLYCERIN      . ondansetron      . acyclovir  800 mg Oral 5 X Daily  . amiodarone  400 mg Oral BID  . atorvastatin  10 mg Oral q1800  . budesonide  0.5 mg Nebulization BID  . enalapril  20 mg Oral BID  . furosemide  80 mg Oral BID  . insulin aspart  0-15 Units Subcutaneous TID WC  . metoprolol  5 mg Intravenous Q8H  . montelukast  10 mg Oral QHS  . polyethylene glycol  17 g Oral Daily  . potassium chloride  20 mEq Oral TID  . rOPINIRole  2 mg Oral QHS  . traZODone  150 mg Oral QHS  . warfarin  3 mg Oral ONCE-1800  . Warfarin - Pharmacist Dosing Inpatient   Does not apply q1800   OBJECTIVE  Filed Vitals:   04/03/12 1100 04/03/12 1253 04/03/12 1400 04/03/12 1500  BP: 82/61  116/69 119/73  Pulse:   112 120  Temp:   97.9 F (36.6 C)   TempSrc:  Oral Oral   Resp:   20   Height:   5' 0.6" (1.539 m)   Weight:   300 lb  7.8 oz (136.3 kg)   SpO2: 95%  99% 99%    Intake/Output Summary (Last 24 hours) at 04/03/12 1732 Last data filed at 04/03/12 1500  Gross per 24 hour  Intake    600 ml  Output   3900 ml  Net  -3300 ml   Filed Weights   04/02/12 0500 04/03/12 0400 04/03/12 1400  Weight: 295 lb 3.1 oz (133.9 kg) 305 lb 8.9 oz (138.6 kg) 300 lb 7.8 oz (136.3 kg)   PHYSICAL EXAM  General: restless, grunting. Neuro: Not fully following commands.  Left sided wkns apparent. HEENT:  Normal  Neck: Supple without bruits or JVD. Lungs:  Resp regular and unlabored, CTA. Heart: IR, IR. Abdomen: Soft, non-tender, non-distended, BS + x 4.  Extremities: No clubbing, cyanosis.  1+ bilat LE edema. DP/PT/Radials 1+ and equal bilaterally  Accessory Clinical Findings  CBC  Basename 04/03/12 0500 04/02/12 0435  WBC 4.8 4.8  NEUTROABS -- --  HGB 9.0* 8.7*  HCT 29.8* 29.3*  MCV 72.2* 72.5*  PLT 91* 98*   Basic Metabolic Panel  Basename 04/03/12 0500 04/02/12 0435  NA 138 136  K 3.6 3.6  CL 102 101  CO2 28 26  GLUCOSE 110* 152*  BUN 24*  24*  CREATININE 1.32* 1.21*  CALCIUM 8.6 8.3*  MG -- --  PHOS -- --   Liver Function Tests  [facility] 03/31/12 2330  AST 23  ALT 19  ALKPHOS 71  BILITOT 0.5  PROT 5.7*  ALBUMIN 3.1*   Thyroid Function Tests  Basename 04/01/12 0430  TSH 4.884*  T4TOTAL --  T3FREE --  THYROIDAB --   TELE  afib rvr  ECG  Afib, 126, lat q's, poor r prog.  No acute st/t changes compared to this morning's tracing.  Radiology/Studies  Stat non-contrast head CT and cxr pending.  ASSESSMENT AND PLAN  1.  Acute AMS:  Symptoms concerning for recurrent CVA.  Recent CVA in [place], apparently recovering well but this evening developed acute change in mental status with slurring of speech.  She has left sided wkns, which according to previous notes, was not present earlier.  Code stroke has been called and she has been seen by neurology.  Stat non-contrast CT is being  performed now.  She has known LAA thrombus and has been on coumadin.  INR was subrx today.  2.  Chest Pain:  In setting of above.  Recycle CE.  Patent grafts on cath.  No acute changes on ecg.    3.  Afib RVR:  Rate control only at this point secondary to LAA thrombus.  Cont amio.  Switch bb to lower dose and IV formulation as she is not currently able to take PO's (#1).  4.  ICM/Systolic CHF:  Cont bb/acei.  Volume appears to be stable.  Weight stable.  Signed, Nicolasa Ducking NP

## 2012-04-03 NOTE — Progress Notes (Signed)
Called to room by visitor when client was unable to speak clearly.  C/o chest pain.  Vital signs 130/116-128-22.  O2 via nasal cannula started at 2l/[place] and increased to 4L by Rapid Response Nurse which was called to bedside.  Skin is cold and clammy and client is unable to verbalize when spoken too.  Left sided deficit noted.  See Rapid Response Nurse Notes for continuation.  Vital signs 118/63-136 upon transfer.  Still unable to speak clearly.

## 2012-04-03 NOTE — Progress Notes (Signed)
[employer] PA Ward Givens notified of frequent runs of V Tach.  Client is asymptomatic.  Vital signs stable.

## 2012-04-03 NOTE — Consult Note (Signed)
Referring Physician: Excell Seltzer    Chief Complaint: Left sided weakness, slurred speech  HPI: Caitlyn Hardy is an 67 y.o. female who initially presented to [facility] where she was admitted with an acute cerebellar infarct.  The patient developed afib with RVR and was transferred her for further management.  Patient underwent a cath with no significant CAD noted but TEE was performed as well that revealed an atrial thrombus and therefore patient was not cardioverted.  Patient has been on Coumadin.  INR 1.8.   Today while talking with visitors had acute onset of slurred speech and left sided weakness.  Code stroke was called at that time.    LSN: [id number] tPA Given: No: Elevated INR, recent infarct  Past Medical History  Diagnosis Date  . Myocardial infarction     2011  . Hypertension   . Dysrhythmia 2013    afib  . Depression   . Shortness of breath   . COPD (chronic obstructive pulmonary disease)   . Sleep apnea     CPAP hs  . Diabetes mellitus without complication   . Stroke 2013  . CHF (congestive heart failure)   . Chronic kidney disease   . H/O hiatal hernia   . Headache   . Arthritis   . Anemia     Past Surgical History  Procedure Date  . Coronary artery bypass graft 2001    x 4 vessels  . Abdominal hysterectomy   . ;   . Rhinosplasty     x 2  . Tonsillectomy   . Knee replacemnt 2000    total right  . Shoulder sx 2000    right bone spurs  . Cardiac catheterization 2013  . Tee without cardioversion 04/01/2012    Procedure: TRANSESOPHAGEAL ECHOCARDIOGRAM (TEE);  Surgeon: Vesta Mixer, MD;  Location: [facility] ENDOSCOPY;  Service: Cardiovascular;  Laterality: N/A;    History reviewed. No pertinent family history.  Social History:  reports that she has quit smoking. She does not have any smokeless tobacco history on file. She reports that she does not drink alcohol or use illicit drugs.  Allergies:  Allergies  Allergen Reactions  . Penicillins Anaphylaxis  .  Azithromycin Hives and Rash  . Bactrim (Sulfamethoxazole W-Trimethoprim) Hives and Rash  . Sulfa Antibiotics Hives and Rash    Medications:  I have reviewed the patient's current medications. Scheduled:   . acyclovir  800 mg Oral 5 X Daily  . amiodarone  400 mg Oral BID  . atorvastatin  10 mg Oral q1800  . budesonide  0.5 mg Nebulization BID  . enalapril  20 mg Oral BID  . furosemide  80 mg Oral BID  . insulin aspart  0-15 Units Subcutaneous TID WC  . metoprolol  5 mg Intravenous Q8H  . montelukast  10 mg Oral QHS  . ondansetron      . polyethylene glycol  17 g Oral Daily  . potassium chloride  20 mEq Oral TID  . rOPINIRole  2 mg Oral QHS  . traZODone  150 mg Oral QHS  . warfarin  3 mg Oral ONCE-1800  . Warfarin - Pharmacist Dosing Inpatient   Does not apply q1800    ROS: History obtained from the patient  General ROS: negative for - chills, fatigue, fever, night sweats, weight gain or weight loss Psychological ROS: negative for - behavioral disorder, hallucinations, memory difficulties, mood swings or suicidal ideation Ophthalmic ROS: negative for - blurry vision, double vision, eye pain or loss  of vision ENT ROS: negative for - epistaxis, nasal discharge, oral lesions, sore throat, tinnitus or vertigo Allergy and Immunology ROS: negative for - hives or itchy/watery eyes Hematological and Lymphatic ROS: negative for - bleeding problems, bruising or swollen lymph nodes Endocrine ROS: negative for - galactorrhea, hair pattern changes, polydipsia/polyuria or temperature intolerance Respiratory ROS: shortness of breath  Cardiovascular ROS:  chest pain, pedal edema Gastrointestinal ROS: nausea/vomiting  Genito-Urinary ROS: negative for - dysuria, hematuria, incontinence or urinary frequency/urgency Musculoskeletal ROS: negative for - joint swelling or muscular weakness Neurological ROS: as noted in HPI Dermatological ROS: negative for rash and skin lesion changes  Physical  Examination: Blood pressure 119/73, pulse 120, temperature 97.9 F (36.6 C), temperature source Oral, resp. rate 20, height 5' 0.6" (1.539 m), weight 136.3 kg (300 lb 7.8 oz), SpO2 99.00%.  Neurologic Examination: Mental Status: Lethargic, oriented, thought content appropriate.  Speech fluent without evidence of aphasia.  Able to follow simple commands without difficulty. Cranial Nerves: II: Discs flat bilaterally; Visual fields grossly normal, pupils equal, round, reactive to light and accommodation III,IV, VI: ptosis not present, extra-ocular motions intact bilaterally V,VII: smile symmetric, facial light touch sensation normal bilaterally VIII: hearing normal bilaterally IX,X: gag reflex present XI: bilateral shoulder shrug XII: midline tongue extension Motor: Patient with generalized weakness and not holding any extremity off the bed for a prolonged period of time.   Sensory: Responds to noxious stimuli throughout Deep Tendon Reflexes: 2+ in the upper extremities and absent in the lower extremities Plantars: Right: upgoing   Left: downgoing Cerebellar: Patient will not cooperate to perform Gait: Unable to perform CV: pulses palpable throughout   Laboratory Studies:  Basic Metabolic Panel:  Lab 04/03/12 4098 04/02/12 0435 03/31/12 2330 03/31/12 0129 03/30/12 0430  NA 138 136 136 142 138  K 3.6 3.6 3.9 3.5 4.2  CL 102 101 102 108 107  CO2 28 26 25 22 25   GLUCOSE 110* 152* 133* 126* 99  BUN 24* 24* 22 24* 31*  CREATININE 1.32* 1.21* 1.09 0.97 1.06  CALCIUM 8.6 8.3* 8.4 -- --  MG -- -- -- 1.7 --  PHOS -- -- -- -- --    Liver Function Tests:  Lab 03/31/12 2330  AST 23  ALT 19  ALKPHOS 71  BILITOT 0.5  PROT 5.7*  ALBUMIN 3.1*   No results found for this basename: LIPASE:5,AMYLASE:5 in the last 168 hours No results found for this basename: AMMONIA:3 in the last 168 hours  CBC:  Lab 04/03/12 0500 04/02/12 0435 04/01/12 0430 03/31/12 0129 03/30/12 0430  WBC 4.8  4.8 5.7 6.1 6.8  NEUTROABS -- -- -- -- --  HGB 9.0* 8.7* 9.4* 9.2* 9.7*  HCT 29.8* 29.3* 31.2* 31.2* 33.5*  MCV 72.2* 72.5* 72.4* 72.7* 73.5*  PLT 91* 98* 120* 138* 160    Cardiac Enzymes:  Lab 03/28/12 0203  CKTOTAL --  CKMB --  CKMBINDEX --  TROPONINI 3.97*    BNP: No components found with this basename: POCBNP:5  CBG:  Lab 04/03/12 1642 04/03/12 1154 04/03/12 0737 04/02/12 2158 04/02/12 1657  GLUCAP 91 108* 96 119* 106*    Microbiology: Results for orders placed during the hospital encounter of 03/25/12  MRSA PCR SCREENING     Status: Abnormal   Collection Time   03/25/12 11:28 PM      Component Value Range Status Comment   MRSA by PCR POSITIVE (*) NEGATIVE Final   URINE CULTURE     Status: Normal  Collection Time   03/26/12  1:00 AM      Component Value Range Status Comment   Specimen Description URINE, RANDOM   Final    Special Requests NONE   Final    Culture  Setup Time 03/26/2012 08:34   Final    Colony Count NO GROWTH   Final    Culture NO GROWTH   Final    Report Status 03/27/2012 FINAL   Final   CULTURE, EXPECTORATED SPUTUM-ASSESSMENT     Status: Normal   Collection Time   03/28/12  8:37 AM      Component Value Range Status Comment   Specimen Description SPUTUM   Final    Special Requests NONE   Final    Sputum evaluation     Final    Value: MICROSCOPIC FINDINGS SUGGEST THAT THIS SPECIMEN IS NOT REPRESENTATIVE OF LOWER RESPIRATORY SECRETIONS. PLEASE RECOLLECT.     CALLED TO WHITNEY DAVIS RN 03/28/12 4098 WOOTEN,K   Report Status 03/28/2012 FINAL   Final     Coagulation Studies:  Alvira Philips 04/03/12 0500 04/18/2012 0435 04/01/12 0430  LABPROT 21.0* 23.2* 21.3*  INR 1.89* 2.16* 1.93*    Urinalysis: No results found for this basename: COLORURINE:2,APPERANCEUR:2,LABSPEC:2,PHURINE:2,GLUCOSEU:2,HGBUR:2,BILIRUBINUR:2,KETONESUR:2,PROTEINUR:2,UROBILINOGEN:2,NITRITE:2,LEUKOCYTESUR:2 in the last 168 hours  Lipid Panel: No results found for this basename:  chol, trig, hdl, cholhdl, vldl, ldlcalc    HgbA1C:  Lab Results  Component Value Date   HGBA1C 7.1* 03/26/2012    Urine Drug Screen:   No results found for this basename: labopia, cocainscrnur, labbenz, amphetmu, thcu, labbarb    Alcohol Level: No results found for this basename: ETH:2 in the last 168 hours  Imaging: Ct Head Wo Contrast  04/03/2012  *RADIOLOGY REPORT*  Clinical Data: Dysphasia.  Left-sided weakness.  Nausea.  CT HEAD WITHOUT CONTRAST  Technique:  Contiguous axial images were obtained from the base of the skull through the vertex without contrast.  Comparison: None.  Findings: Tiny, linear area of well defined low density in the mid brain on the right.  There is some streak artifact crossing the pons with some associated low density in the anterior pons, greater on the left.  Otherwise, normal appearing cerebral hemispheres and posterior fossa structures.  Normal size and position of the ventricles.  No intracranial hemorrhage, mass lesion or CT evidence of acute infarction.  Left sphenoid sinus mucosal thickening and small air fluid level.  IMPRESSION:  1.  Possible tiny, old right mid brain lacunar infarct. 2.  Streak artifacts crossing the pons, making it difficult to exclude a pontine infarct. 3.  No intracranial hemorrhage or definitive CT findings to indicate acute infarction. 4.  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  These results were called by telephone on 04/03/2012 at 1805 hours to Dr. Thad Ranger, who verbally acknowledged these results.   Original Report Authenticated By: Beckie Salts, M.D.     Assessment: 67 y.o. female with atrial fibrillation/RVR and atrial thrombus who experienced acute onset slurred speech and left sided weakness.  Patient on Coumadin.  Symptoms improving.  CT shows no acute changes.  Patient not a candidate for tPA.  Symptoms likely embolic in etiology.    Stroke Risk Factors - atrial fibrillation, diabetes mellitus,  hypertension and recent infarct, atrial thrombus  Plan: 1. HgbA1c, fasting lipid panel 2. MRI of the brain without contrast 3. PT consult, OT consult, Speech consult 4. Prophylactic therapy-to continue on Coumadin 5. Risk factor modification 6. Telemetry monitoring 7. Frequent neuro checks  This  patient is critically ill and at significant risk of neurological worsening, death and care requires constant monitoring of vital signs, hemodynamics,respiratory and cardiac monitoring, neurological assessment, discussion with family, other specialists and medical decision making of high complexity. I spent 45 minutes of neurocritical care time  in the care of  this patient.  Thana Farr, MD [facility] [phone] 04/03/2012  6:27 PM

## 2012-04-03 NOTE — Code Documentation (Signed)
Called to patients room for patient nausea with dry heaves and having runs of vt.  Upon arrival to patients room RN and family at bedside,  Patient diaphoretic, pale with dry heaves on oxygen.  EKG being done.  Brion Aliment, NP at bedside code stroke called at 1725, patient in CT at 1748, NIHSS 15.  LSN at 1700, as per family at bedside patient suddenly developed garbled speech and left side weakness and decreased responsiveness.  Dr Thad Ranger at bedside.   En route back to patients room symptoms started resolving, patient more alert still lethargic able to state her name age and month and able to move left side.  Patient to be transferred to ICU

## 2012-04-03 NOTE — Progress Notes (Signed)
SUBJECTIVE:  No chest pain.  Back pain still.  No acute SOB.    PHYSICAL EXAM Filed Vitals:   04/03/12 0730 04/03/12 0731 04/03/12 0916 04/03/12 0954  BP: 101/72   88/53  Pulse:  102    Temp:  97.7 F (36.5 C)    TempSrc:  Oral    Resp:      Height:      Weight:      SpO2: 98%  96%    General:  No distress Lungs:  Clear Heart:  Irregular Abdomen:  Positive bowel sounds, no rebound no guarding Extremities:  Mild edema   LABS: Lab Results  Component Value Date   TROPONINI 3.97* 03/28/2012   Results for orders placed during the hospital encounter of 03/25/12 (from the past 24 hour(s))  GLUCOSE, CAPILLARY     Status: Abnormal   Collection Time   04/02/12 11:51 AM      Component Value Range   Glucose-Capillary 131 (*) 70 - 99 mg/dL  GLUCOSE, CAPILLARY     Status: Abnormal   Collection Time   04/02/12  4:57 PM      Component Value Range   Glucose-Capillary 106 (*) 70 - 99 mg/dL   Comment 1 Documented in Chart     Comment 2 Notify RN    GLUCOSE, CAPILLARY     Status: Abnormal   Collection Time   04/02/12  9:58 PM      Component Value Range   Glucose-Capillary 119 (*) 70 - 99 mg/dL   Comment 1 Documented in Chart     Comment 2 Notify RN    CBC     Status: Abnormal   Collection Time   04/03/12  5:00 AM      Component Value Range   WBC 4.8  4.0 - 10.5 K/uL   RBC 4.13  3.87 - 5.11 MIL/uL   Hemoglobin 9.0 (*) 12.0 - 15.0 g/dL   HCT 16.1 (*) 09.6 - 04.5 %   MCV 72.2 (*) 78.0 - 100.0 fL   MCH 21.8 (*) 26.0 - 34.0 pg   MCHC 30.2  30.0 - 36.0 g/dL   RDW 40.9 (*) 81.1 - 91.4 %   Platelets 91 (*) 150 - 400 K/uL  PROTIME-INR     Status: Abnormal   Collection Time   04/03/12  5:00 AM      Component Value Range   Prothrombin Time 21.0 (*) 11.6 - 15.2 seconds   INR 1.89 (*) 0.00 - 1.49  BASIC METABOLIC PANEL     Status: Abnormal   Collection Time   04/03/12  5:00 AM      Component Value Range   Sodium 138  135 - 145 mEq/L   Potassium 3.6  3.5 - 5.1 mEq/L   Chloride 102  96  - 112 mEq/L   CO2 28  19 - 32 mEq/L   Glucose, Bld 110 (*) 70 - 99 mg/dL   BUN 24 (*) 6 - 23 mg/dL   Creatinine, Ser 7.82 (*) 0.50 - 1.10 mg/dL   Calcium 8.6  8.4 - 95.6 mg/dL   GFR calc non Af Amer 41 (*) >90 mL/min   GFR calc Af Amer 48 (*) >90 mL/min  GLUCOSE, CAPILLARY     Status: Normal   Collection Time   04/03/12  7:37 AM      Component Value Range   Glucose-Capillary 96  70 - 99 mg/dL    Intake/Output Summary (Last 24 hours) at 04/03/12  1020 Last data filed at 04/02/12 2200  Gross per 24 hour  Intake  470.8 ml  Output   2550 ml  Net -2079.2 ml    ASSESSMENT AND PLAN:  Acute on chronic systolic CHF - Down 10 liters since admit (althogh weight is up today?).  Will continue medical therapy with combination of metoprolol and enalapril.  Per Dr Earmon Phoenix plan I will switch to PO Lasix.   CAD s/p CABG and PCI. Grafts and stents patent at cath. ASA stopped with warfarin on board and thrombocytopenia.  Atrial Fibrillation - Now on PO amio.  On therapeutic warfarin.  The plan is for outpatient cardioversion.  Rate remains difficult to control.    Morbid obesity/deconditioning. Will need SNF at discharge, suspect early next week.   Recent stroke. Continue coumadin.   Chronic pain.  PRN analgesics are written.    Fayrene Fearing Bettyanne Dittman 04/03/2012 10:20 AM

## 2012-04-04 LAB — CBC
MCH: 21.5 pg — ABNORMAL LOW (ref 26.0–34.0)
MCHC: 29.5 g/dL — ABNORMAL LOW (ref 30.0–36.0)
RDW: 20.5 % — ABNORMAL HIGH (ref 11.5–15.5)

## 2012-04-04 LAB — BASIC METABOLIC PANEL
BUN: 21 mg/dL (ref 6–23)
CO2: 32 mEq/L (ref 19–32)
Calcium: 8.5 mg/dL (ref 8.4–10.5)
Creatinine, Ser: 1.18 mg/dL — ABNORMAL HIGH (ref 0.50–1.10)
GFR calc Af Amer: 54 mL/min — ABNORMAL LOW (ref >90)

## 2012-04-04 LAB — GLUCOSE, CAPILLARY
Glucose-Capillary: 118 mg/dL — ABNORMAL HIGH (ref 70–99)
Glucose-Capillary: 127 mg/dL — ABNORMAL HIGH (ref 70–99)

## 2012-04-04 LAB — PROTIME-INR
INR: 1.72 — ABNORMAL HIGH (ref 0.00–1.49)
Prothrombin Time: 19.6 seconds — ABNORMAL HIGH (ref 11.6–15.2)

## 2012-04-04 LAB — TROPONIN I
Troponin I: <0.3 ng/mL (ref <0.30)
Troponin I: <0.3 ng/mL (ref <0.30)

## 2012-04-04 MED ORDER — POTASSIUM CHLORIDE 10 MEQ/100ML IV SOLN
INTRAVENOUS | Status: AC
  Filled 2012-04-04: qty 100

## 2012-04-04 MED ORDER — AMIODARONE HCL 200 MG PO TABS
400.0000 mg | ORAL_TABLET | Freq: Two times a day (BID) | ORAL | Status: DC
  Administered 2012-04-04 – 2012-04-05 (×3): 400 mg via ORAL
  Filled 2012-04-04 (×4): qty 2

## 2012-04-04 MED ORDER — METOPROLOL TARTRATE 12.5 MG HALF TABLET
12.5000 mg | ORAL_TABLET | Freq: Three times a day (TID) | ORAL | Status: DC
  Administered 2012-04-04 – 2012-04-05 (×3): 12.5 mg via ORAL
  Filled 2012-04-04 (×6): qty 1

## 2012-04-04 MED ORDER — WARFARIN SODIUM 6 MG PO TABS
6.5000 mg | ORAL_TABLET | Freq: Once | ORAL | Status: AC
  Administered 2012-04-04: 6.5 mg via ORAL
  Filled 2012-04-04: qty 1

## 2012-04-04 NOTE — Progress Notes (Signed)
Stroke Team Progress Note  HISTORY Caitlyn Hardy is an 67 y.o. female who initially presented to [facility] where she was admitted with an acute cerebellar infarct. The patient developed afib with RVR and was transferred her for further management. Patient underwent a cath with no significant CAD noted but TEE was performed as well that revealed an atrial thrombus and therefore patient was not cardioverted. Patient has been on Coumadin. INR 1.8.  On 04/03/12 while talking with visitors she had acute onset of slurred speech and left sided weakness. Code stroke was called at that time.   LSN: [id number] on 04/03/12 tPA Given: No: Elevated INR, recent infarct     SUBJECTIVE Two family members at the bedside.  Overall she feels her condition is unchanged.Dr Antoine Poche discussed case with Dr Loretha Brasil. The main concern is whether the patient should be placed on Heparin until the coumadin level was therapeutic. Dr Loretha Brasil advised against the use of Heparin in the setting of an acute stroke due to the risk of hemorrhagic conversion. Pharmacy is dosing the coumadin.  OBJECTIVE Most recent Vital Signs: Filed Vitals:   04/04/12 1000 04/04/12 1100 04/04/12 1200 04/04/12 1300  BP: 90/58 103/51 101/63 104/64  Pulse: 59 140 89 80  Temp:      TempSrc:      Resp: 12 15 14 16   Height:      Weight:      SpO2: 100% 100% 100% 100%   CBG (last 3)   Basename 04/04/12 1209 04/03/12 2214 04/03/12 1642  GLUCAP 118* 135* 91    IV Fluid Intake:     . sodium chloride 10 mL/hr at 04/03/12 2154    MEDICATIONS    . acyclovir  800 mg Oral 5 X Daily  . amiodarone  400 mg Oral BID  . atorvastatin  10 mg Oral q1800  . budesonide  0.5 mg Nebulization BID  . enalapril  20 mg Oral BID  . furosemide  80 mg Oral BID  . insulin aspart  0-15 Units Subcutaneous TID WC  . metoprolol tartrate  12.5 mg Oral Q8H  . montelukast  10 mg Oral QHS  . polyethylene glycol  17 g Oral Daily  . potassium chloride      .  potassium chloride  20 mEq Oral TID  . rOPINIRole  2 mg Oral QHS  . traZODone  150 mg Oral QHS  . warfarin  6.5 mg Oral ONCE-1800  . Warfarin - Pharmacist Dosing Inpatient   Does not apply q1800   PRN:  sodium chloride, sodium chloride, acetaminophen, acetaminophen, HYDROcodone-acetaminophen, HYDROcodone-acetaminophen, levalbuterol, morphine injection, ondansetron (ZOFRAN) IV, ondansetron, zolpidem  Diet:  Cardiac thin liquids Activity:  Bedrest DVT Prophylaxis:  On coumadin  CLINICALLY SIGNIFICANT STUDIES Basic Metabolic Panel:  Lab 04/04/12 4098 04/03/12 1814 03/31/12 0129  NA 142 136 --  K 3.4* 3.8 --  CL 102 98 --  CO2 32 27 --  GLUCOSE 134* 155* --  BUN 21 24* --  CREATININE 1.18* 1.31* --  CALCIUM 8.5 9.0 --  MG -- 1.8 1.7  PHOS -- -- --   Liver Function Tests:  Lab 03/31/12 2330  AST 23  ALT 19  ALKPHOS 71  BILITOT 0.5  PROT 5.7*  ALBUMIN 3.1*   CBC:  Lab 04/04/12 0500 04/03/12 0500  WBC 5.3 4.8  NEUTROABS -- --  HGB 8.8* 9.0*  HCT 29.8* 29.8*  MCV 72.7* 72.2*  PLT 82* 91*   Coagulation:  Lab 04/04/12 0500 04/03/12 0500  04/02/12 0435 04/01/12 0430  LABPROT 19.6* 21.0* 23.2* 21.3*  INR 1.72* 1.89* 2.16* 1.93*   Cardiac Enzymes:  Lab 04/04/12 0528 04/04/12 0030 04/03/12 1839  CKTOTAL -- -- --  CKMB -- -- --  CKMBINDEX -- -- --  TROPONINI <0.30 <0.30 <0.30   Urinalysis: No results found for this basename: COLORURINE:2,APPERANCEUR:2,LABSPEC:2,PHURINE:2,GLUCOSEU:2,HGBUR:2,BILIRUBINUR:2,KETONESUR:2,PROTEINUR:2,UROBILINOGEN:2,NITRITE:2,LEUKOCYTESUR:2 in the last 168 hours Lipid Panel No results found for this basename: chol, trig, hdl, cholhdl, vldl, ldlcalc   HgbA1C  Lab Results  Component Value Date   HGBA1C 7.1* 03/26/2012    Urine Drug Screen:   No results found for this basename: labopia, cocainscrnur, labbenz, amphetmu, thcu, labbarb    Alcohol Level: No results found for this basename: ETH:2 in the last 168 hours  Ct Head Wo  Contrast  04/03/2012   IMPRESSION:  1.  Possible tiny, old right mid brain lacunar infarct. 2.  Streak artifacts crossing the pons, making it difficult to exclude a pontine infarct. 3.  No intracranial hemorrhage or definitive CT findings to indicate acute infarction. 4.  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   Dg Chest Port 1 View  04/03/2012  IMPRESSION: Mild bibasilar atelectasis or scarring.         MRI of the brain  - Pending  MRA of the brain  - Pending  2D Echocardiogram  - TEE 04/01/12 Ef 20% Left atrial thrombus  Carotid Doppler    EKG  Afib rate 97 BPM  Therapy Recommendations Pending  Physical Exam   Neurologic Examination:  Mental Status:  Lethargic, oriented, thought content appropriate. Speech fluent without evidence of aphasia. Able to follow simple commands without difficulty.  Cranial Nerves:  II: Discs flat bilaterally; Visual fields grossly normal, pupils equal, round, reactive to light and accommodation  III,IV, VI: ptosis not present, extra-ocular motions intact bilaterally  V,VII: smile symmetric, facial light touch sensation normal bilaterally  VIII: hearing normal bilaterally  IX,X: gag reflex present  XI: bilateral shoulder shrug  XII: midline tongue extension  Motor:  Patient with generalized weakness and not holding any extremity off the bed for a prolonged period of time.  Sensory: Responds to noxious stimuli throughout  Deep Tendon Reflexes: 2+ in the upper extremities and absent in the lower extremities  Plantars:  Right: upgoing Left: downgoing  Cerebellar:  Patient will not cooperate to perform  Gait: Unable to perform  CV: pulses palpable throughout   ASSESSMENT Ms. Caitlyn Hardy is a 67 y.o. female presenting with slurred speech and left sided weakness . No TPA secondary to coumadin therapy and recent stroke. Imaging confirms a  possible tiny, old right mid brain lacunar infarct. Streak artifacts crossing the pons,  made it difficult to exclude a pontine infarct. No intracranial hemorrhage or definitive CT findings to indicate acute infarction Infarct felt to be embolic secondary to left atrial thrombus. Work up underway. On warfarin prior to admission. Now on warfarin for secondary stroke prevention. Patient with resultant left sided weakness.   Left atrial thrombus  Afib  CAD   Hypertension  Cardiomyopathy / CHF  COPD  DM  Coumadin therapy  Hospital day # 10  TREATMENT/PLAN  Continue coumadin for atrial thrombus  No Heparin due to risk of hemorrhagic conversion.  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 Pt is to continue Coumadin, level lower today but dose increase. Do not feel  that Heparin should be added at this time with bridging due to higher chance of hemorrhagic conversion.

## 2012-04-04 NOTE — Progress Notes (Signed)
SUBJECTIVE:  Acute neurologic event last night.  Likely embolic.     PHYSICAL EXAM Filed Vitals:   04/04/12 0300 04/04/12 0400 04/04/12 0508 04/04/12 0540  BP: 85/49 93/47 100/58   Pulse: 49 69 80 81  Temp:  97.7 F (36.5 C)    TempSrc:  Oral    Resp: 19 19 24 16   Height:      Weight:    294 lb 15.6 oz (133.8 kg)  SpO2: 100% 100% 100% 100%   General:  No distress Lungs:  Clear Heart:  Irregular Abdomen:  Positive bowel sounds, no rebound no guarding Extremities:  Mild edema Neuro:  Mild left upper extremity weakness and facial droop   LABS: Lab Results  Component Value Date   TROPONINI <0.30 04/04/2012   Results for orders placed during the hospital encounter of 03/25/12 (from the past 24 hour(s))  GLUCOSE, CAPILLARY     Status: Abnormal   Collection Time   04/03/12 11:54 AM      Component Value Range   Glucose-Capillary 108 (*) 70 - 99 mg/dL  GLUCOSE, CAPILLARY     Status: Normal   Collection Time   04/03/12  4:42 PM      Component Value Range   Glucose-Capillary 91  70 - 99 mg/dL   Comment 1 Notify RN    MAGNESIUM     Status: Normal   Collection Time   04/03/12  6:14 PM      Component Value Range   Magnesium 1.8  1.5 - 2.5 mg/dL  BASIC METABOLIC PANEL     Status: Abnormal   Collection Time   04/03/12  6:14 PM      Component Value Range   Sodium 136  135 - 145 mEq/L   Potassium 3.8  3.5 - 5.1 mEq/L   Chloride 98  96 - 112 mEq/L   CO2 27  19 - 32 mEq/L   Glucose, Bld 155 (*) 70 - 99 mg/dL   BUN 24 (*) 6 - 23 mg/dL   Creatinine, Ser 9.56 (*) 0.50 - 1.10 mg/dL   Calcium 9.0  8.4 - 21.3 mg/dL   GFR calc non Af Amer 41 (*) >90 mL/min   GFR calc Af Amer 48 (*) >90 mL/min  TROPONIN I     Status: Normal   Collection Time   04/03/12  6:39 PM      Component Value Range   Troponin I <0.30  <0.30 ng/mL  LACTIC ACID, PLASMA     Status: Normal   Collection Time   04/03/12  6:39 PM      Component Value Range   Lactic Acid, Venous 1.9  0.5 - 2.2 mmol/L  GLUCOSE, CAPILLARY      Status: Abnormal   Collection Time   04/03/12 10:14 PM      Component Value Range   Glucose-Capillary 135 (*) 70 - 99 mg/dL  TROPONIN I     Status: Normal   Collection Time   04/04/12 12:30 AM      Component Value Range   Troponin I <0.30  <0.30 ng/mL  CBC     Status: Abnormal   Collection Time   04/04/12  5:00 AM      Component Value Range   WBC 5.3  4.0 - 10.5 K/uL   RBC 4.10  3.87 - 5.11 MIL/uL   Hemoglobin 8.8 (*) 12.0 - 15.0 g/dL   HCT 08.6 (*) 57.8 - 46.9 %   MCV 72.7 (*) 78.0 -  100.0 fL   MCH 21.5 (*) 26.0 - 34.0 pg   MCHC 29.5 (*) 30.0 - 36.0 g/dL   RDW 16.1 (*) 09.6 - 04.5 %   Platelets 82 (*) 150 - 400 K/uL  PROTIME-INR     Status: Abnormal   Collection Time   04/04/12  5:00 AM      Component Value Range   Prothrombin Time 19.6 (*) 11.6 - 15.2 seconds   INR 1.72 (*) 0.00 - 1.49  BASIC METABOLIC PANEL     Status: Abnormal   Collection Time   04/04/12  5:00 AM      Component Value Range   Sodium 142  135 - 145 mEq/L   Potassium 3.4 (*) 3.5 - 5.1 mEq/L   Chloride 102  96 - 112 mEq/L   CO2 32  19 - 32 mEq/L   Glucose, Bld 134 (*) 70 - 99 mg/dL   BUN 21  6 - 23 mg/dL   Creatinine, Ser 4.09 (*) 0.50 - 1.10 mg/dL   Calcium 8.5  8.4 - 81.1 mg/dL   GFR calc non Af Amer 47 (*) >90 mL/min   GFR calc Af Amer 54 (*) >90 mL/min  TROPONIN I     Status: Normal   Collection Time   04/04/12  5:28 AM      Component Value Range   Troponin I <0.30  <0.30 ng/mL    Intake/Output Summary (Last 24 hours) at 04/04/12 0820 Last data filed at 04/04/12 0500  Gross per 24 hour  Intake 794.39 ml  Output   5650 ml  Net -4855.61 ml    CT HEAD:  1. Possible tiny, old right mid brain lacunar infarct.  2. Streak artifacts crossing the pons, making it difficult to  exclude a pontine infarct.  3. No intracranial hemorrhage or definitive CT findings to  indicate acute infarction.  4. 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  EKG: Atrial fibrillation, rate 73, non  specific anterior ST elevation    ASSESSMENT AND PLAN:  Stroke. Continue coumadin. Mild residual improving.  I spoke with neurology and they do not want IV heparin at this point.   Acute on chronic systolic CHF - Down 15 liters since admit and down almost five liters yesterday.  Will continue medical therapy with combination enalapril. Switched to PO Lasix yesterday.  Hypotension precludes med titration.  I will try to restart a low dose of beta blocker.   CAD s/p CABG and PCI. Grafts and stents patent at cath. ASA stopped with warfarin on board and thrombocytopenia.  She did have chest pain yesterday but her enzymes are negative.  No acute EKG changes.    Atrial Fibrillation - Now on IV amio.  On warfarin.  The plan is for outpatient cardioversion.  Rate remains difficult to control rate.  Warfarin per pharmacy.  I will switch back to PO amiodarone.    Morbid obesity/deconditioning. Will need SNF at discharge.  PT consulted  Chronic pain.  PRN analgesics are written.   Thrombocytopenia:  Stable   Rollene Rotunda 04/04/2012 8:20 AM

## 2012-04-04 NOTE — Progress Notes (Signed)
ANTICOAGULATION CONSULT NOTE - Follow Up Consult  Pharmacy Consult for warfarin Indication: atrial fibrillation and LAA thrombus  Allergies  Allergen Reactions  . Penicillins Anaphylaxis  . Azithromycin Hives and Rash  . Bactrim (Sulfamethoxazole W-Trimethoprim) Hives and Rash  . Sulfa Antibiotics Hives and Rash    Patient Measurements: Height: 5' 0.6" (153.9 cm) Weight: 294 lb 15.6 oz (133.8 kg) IBW/kg (Calculated) : 46.88    Vital Signs: Temp: 97.7 F (36.5 C) (01/05 0400) Temp src: Oral (01/05 0400) BP: 100/58 mmHg (01/05 0508) Pulse Rate: 81  (01/05 0540)  Labs:  Basename 04/04/12 0528 04/04/12 0500 04/04/12 0030 04/03/12 1839 04/03/12 1814 04/03/12 0500 04/02/12 0435 04/01/12 1400  HGB -- 8.8* -- -- -- 9.0* -- --  HCT -- 29.8* -- -- -- 29.8* 29.3* --  PLT -- 82* -- -- -- 91* 98* --  APTT -- -- -- -- -- -- -- --  LABPROT -- 19.6* -- -- -- 21.0* 23.2* --  INR -- 1.72* -- -- -- 1.89* 2.16* --  HEPARINUNFRC -- -- -- -- -- -- 0.37 0.45  CREATININE -- 1.18* -- -- 1.31* 1.32* -- --  CKTOTAL -- -- -- -- -- -- -- --  CKMB -- -- -- -- -- -- -- --  TROPONINI <0.30 -- <0.30 <0.30 -- -- -- --    Estimated Creatinine Clearance: 60.5 ml/min (by C-G formula based on Cr of 1.18).   Medications:  Scheduled:    . potassium chloride      . acyclovir  800 mg Oral 5 X Daily  . [COMPLETED] amiodarone  150 mg Intravenous Once  . atorvastatin  10 mg Oral q1800  . budesonide  0.5 mg Nebulization BID  . enalapril  20 mg Oral BID  . furosemide  80 mg Oral BID  . insulin aspart  0-15 Units Subcutaneous TID WC  . metoprolol  5 mg Intravenous Q8H  . montelukast  10 mg Oral QHS  . [COMPLETED] ondansetron      . polyethylene glycol  17 g Oral Daily  . potassium chloride  20 mEq Oral TID  . rOPINIRole  2 mg Oral QHS  . traZODone  150 mg Oral QHS  . [COMPLETED] warfarin  3 mg Oral ONCE-1800  . Warfarin - Pharmacist Dosing Inpatient   Does not apply q1800  . [DISCONTINUED]  amiodarone  150 mg Intravenous Once  . [DISCONTINUED] amiodarone  400 mg Oral BID  . [DISCONTINUED] furosemide  80 mg Intravenous BID  . [DISCONTINUED] metoprolol tartrate  100 mg Oral TID  . [DISCONTINUED] metoprolol tartrate  50 mg Oral TID  . [DISCONTINUED] metoprolol tartrate  50 mg Oral TID  . [DISCONTINUED] potassium chloride  20 mEq Oral BID  . [DISCONTINUED] rOPINIRole  1 mg Oral BID    Assessment: 67 y.o. female with new onset Afib on coumadin per pharmacy.Due to moderate INR increase  after 5 mg x 2  dose was held on 1/2.  Last night code stroke called likely recurrent embolic stroke. Patient also has LAA thrombus revealed on echo. Today's INR trend down to 1.78. Will boost with slightly higher dose of coumadin for tonight. No noted bleeding, CBC low but stable. PLT's trending down 120 > 91.  Goal of Therapy:  INR 2-3 Monitor platelets by anticoagulation protocol: Yes   Plan:  1. Coumadin 6.5 mg x 1 tonight 2. F/u INR in AM 3. Monitor s/sx of bleeding  Bola A. Wandra Feinstein D Clinical Pharmacist Pager:[phone] Phone [phone] 04/04/2012  7:44 AM

## 2012-04-05 ENCOUNTER — Inpatient Hospital Stay (HOSPITAL_COMMUNITY): Payer: Medicare Other

## 2012-04-05 ENCOUNTER — Other Ambulatory Visit: Payer: Self-pay

## 2012-04-05 DIAGNOSIS — I509 Heart failure, unspecified: Secondary | ICD-10-CM

## 2012-04-05 LAB — CBC
MCHC: 28.1 g/dL — ABNORMAL LOW (ref 30.0–36.0)
RDW: 20.9 % — ABNORMAL HIGH (ref 11.5–15.5)

## 2012-04-05 LAB — PROTIME-INR
INR: 1.66 — ABNORMAL HIGH (ref 0.00–1.49)
Prothrombin Time: 19.1 seconds — ABNORMAL HIGH (ref 11.6–15.2)

## 2012-04-05 LAB — GLUCOSE, CAPILLARY
Glucose-Capillary: 114 mg/dL — ABNORMAL HIGH (ref 70–99)
Glucose-Capillary: 125 mg/dL — ABNORMAL HIGH (ref 70–99)

## 2012-04-05 LAB — BASIC METABOLIC PANEL
CO2: 33 mEq/L — ABNORMAL HIGH (ref 19–32)
Calcium: 8.7 mg/dL (ref 8.4–10.5)
Creatinine, Ser: 1.24 mg/dL — ABNORMAL HIGH (ref 0.50–1.10)
GFR calc Af Amer: 51 mL/min — ABNORMAL LOW (ref >90)

## 2012-04-05 MED ORDER — ENALAPRIL MALEATE 2.5 MG PO TABS
2.5000 mg | ORAL_TABLET | Freq: Two times a day (BID) | ORAL | Status: DC
  Administered 2012-04-05 – 2012-04-06 (×3): 2.5 mg via ORAL
  Filled 2012-04-05 (×5): qty 1

## 2012-04-05 MED ORDER — SODIUM CHLORIDE 0.9 % IV BOLUS (SEPSIS)
500.0000 mL | Freq: Once | INTRAVENOUS | Status: AC
  Administered 2012-04-05: 500 mL via INTRAVENOUS

## 2012-04-05 MED ORDER — METOPROLOL TARTRATE 25 MG PO TABS
25.0000 mg | ORAL_TABLET | Freq: Four times a day (QID) | ORAL | Status: DC
  Administered 2012-04-05 – 2012-04-06 (×3): 25 mg via ORAL
  Filled 2012-04-05 (×7): qty 1

## 2012-04-05 MED ORDER — AMIODARONE HCL IN DEXTROSE 360-4.14 MG/200ML-% IV SOLN
INTRAVENOUS | Status: AC
  Filled 2012-04-05: qty 200

## 2012-04-05 MED ORDER — DABIGATRAN ETEXILATE MESYLATE 150 MG PO CAPS
150.0000 mg | ORAL_CAPSULE | Freq: Two times a day (BID) | ORAL | Status: DC
  Administered 2012-04-05 – 2012-04-10 (×11): 150 mg via ORAL
  Filled 2012-04-05 (×14): qty 1

## 2012-04-05 MED ORDER — AMIODARONE HCL IN DEXTROSE 360-4.14 MG/200ML-% IV SOLN
30.0000 mg/h | INTRAVENOUS | Status: DC
  Administered 2012-04-05 – 2012-04-06 (×3): 60 mg/h via INTRAVENOUS
  Administered 2012-04-06 – 2012-04-07 (×4): 30 mg/h via INTRAVENOUS
  Filled 2012-04-05 (×11): qty 200

## 2012-04-05 MED ORDER — MUPIROCIN 2 % EX OINT
1.0000 "application " | TOPICAL_OINTMENT | Freq: Two times a day (BID) | CUTANEOUS | Status: DC
  Filled 2012-04-05: qty 22

## 2012-04-05 MED ORDER — ENALAPRIL MALEATE 5 MG PO TABS
5.0000 mg | ORAL_TABLET | Freq: Two times a day (BID) | ORAL | Status: DC
  Administered 2012-04-05: 5 mg via ORAL
  Filled 2012-04-05 (×2): qty 1

## 2012-04-05 MED ORDER — CHLORHEXIDINE GLUCONATE CLOTH 2 % EX PADS: 6.0000 | MEDICATED_PAD | Freq: Every day | CUTANEOUS | Status: DC

## 2012-04-05 MED ORDER — WARFARIN SODIUM 7.5 MG PO TABS
7.5000 mg | ORAL_TABLET | Freq: Once | ORAL | Status: DC
  Filled 2012-04-05: qty 1

## 2012-04-05 MED ORDER — INSULIN ASPART 100 UNIT/ML ~~LOC~~ SOLN
0.0000 [IU] | Freq: Three times a day (TID) | SUBCUTANEOUS | Status: DC
  Administered 2012-04-06 – 2012-04-13 (×8): 2 [IU] via SUBCUTANEOUS
  Administered 2012-04-14 (×2): 3 [IU] via SUBCUTANEOUS
  Administered 2012-04-15 – 2012-04-17 (×3): 2 [IU] via SUBCUTANEOUS
  Administered 2012-04-18: 5 [IU] via SUBCUTANEOUS
  Administered 2012-04-18: 2 [IU] via SUBCUTANEOUS

## 2012-04-05 NOTE — Clinical Social Work Note (Signed)
Clinical Social Worker attempted to speak with patient regarding PT's recommendation of SNF placement at discharge, but patient was tearful and unit RN asked if CSW could re-attempt tomorrow. CSW will continue to follow and assess tomorrow.   Rozetta Nunnery MSW, [employer] [phone]

## 2012-04-05 NOTE — Progress Notes (Addendum)
Patient Name: Caitlyn Hardy Date of Encounter: 04/05/2012  Principal Problem:  *Atrial fibrillation with RVR Active Problems:  CVA (cerebral infarction)  CHF (congestive heart failure)  CAD (coronary artery disease)  Renal failure  Acute on chronic systolic heart failure  Anemia  NSTEMI (non-ST elevated myocardial infarction)    SUBJECTIVE: No chest pain, HA all day. Has not been OOB since CVA. SOB is improved but has DOE (?chronic).  OBJECTIVE Filed Vitals:   04/05/12 0500 04/05/12 0600 04/05/12 0644 04/05/12 0725  BP: 92/65 101/54  111/65  Pulse: 155 70 96   Temp:      TempSrc:      Resp: 18 19 17    Height:      Weight: 283 lb 4.7 oz (128.5 kg)     SpO2: 100% 100% 100%     Intake/Output Summary (Last 24 hours) at 04/05/12 0744 Last data filed at 04/05/12 0400  Gross per 24 hour  Intake  133.5 ml  Output   1580 ml  Net -1446.5 ml   Filed Weights   04/03/12 1400 04/04/12 0540 04/05/12 0500  Weight: 300 lb 7.8 oz (136.3 kg) 294 lb 15.6 oz (133.8 kg) 283 lb 4.7 oz (128.5 kg)    PHYSICAL EXAM General: Well developed, well nourished, female in no acute distress. Head: Normocephalic, atraumatic.  Neck: Supple without bruits, JVD about 8 cm - diff to assess secondary to body habitus. Lungs:  Resp regular and unlabored, decreased BS bases, rales. Heart: Irreg and rapid, S1, S2, no S3, S4, + murmur. Abdomen: Soft, non-tender, non-distended, BS + x 4.  Extremities: No clubbing, cyanosis, trace edema.  Neuro: Alert and oriented X 3. Moves all extremities spontaneously. Psych: Normal affect.  LABS: CBC: Basename 04/05/12 0458 04/04/12 0500  WBC 5.7 5.3  NEUTROABS -- --  HGB 8.4* 8.8*  HCT 29.9* 29.8*  MCV 74.6* 72.7*  PLT 85* 82*   INR: Basename 04/05/12 0458  INR 1.66*   Basic Metabolic Panel: Basename 04/05/12 0458 04/04/12 0500 04/03/12 1814  NA 140 142 --  K 3.6 3.4* --  CL 101 102 --  CO2 33* 32 --  GLUCOSE 110* 134* --  BUN 18 21 --  CREATININE  1.24* 1.18* --  CALCIUM 8.7 8.5 --  MG -- -- 1.8  PHOS -- -- --   Cardiac Enzymes: Basename 04/04/12 0528 04/04/12 0030 04/03/12 1839  CKTOTAL -- -- --  CKMB -- -- --  CKMBINDEX -- -- --  TROPONINI <0.30 <0.30 <0.30   TELE: afib with RVR       Radiology/Studies: Ct Head Wo Contrast 04/03/2012  *RADIOLOGY REPORT*  Clinical Data: Dysphasia.  Left-sided weakness.  Nausea.  CT HEAD WITHOUT CONTRAST  Technique:  Contiguous axial images were obtained from the base of the skull through the vertex without contrast.  Comparison: None.  Findings: Tiny, linear area of well defined low density in the mid brain on the right.  There is some streak artifact crossing the pons with some associated low density in the anterior pons, greater on the left.  Otherwise, normal appearing cerebral hemispheres and posterior fossa structures.  Normal size and position of the ventricles.  No intracranial hemorrhage, mass lesion or CT evidence of acute infarction.  Left sphenoid sinus mucosal thickening and small air fluid level.  IMPRESSION:  1.  Possible tiny, old right mid brain lacunar infarct. 2.  Streak artifacts crossing the pons, making it difficult to exclude a pontine infarct. 3.  No intracranial hemorrhage  or definitive CT findings to indicate acute infarction. 4.  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  These results were called by telephone on 04/03/2012 at 1805 hours to Dr. Thad Ranger, who verbally acknowledged these results.   Original Report Authenticated By: Beckie Salts, M.D.    Dg Chest Port 1 View 04/03/2012  *RADIOLOGY REPORT*  Clinical Data: Short of breath and chest tightness  PORTABLE CHEST - 1 VIEW  Comparison: 03/29/2012  Findings: Right PICC tip in the right innominate vein is unchanged.  Cardiac enlargement with changes of CABG.  No   heart failure. Mild bibasilar atelectasis or scarring.  No significant effusion. Negative for pneumonia.  IMPRESSION: Mild bibasilar atelectasis or  scarring.   Original Report Authenticated By: Janeece Riggers, M.D.    Current Medications:     . acyclovir  800 mg Oral 5 X Daily  . amiodarone  400 mg Oral BID  . atorvastatin  10 mg Oral q1800  . budesonide  0.5 mg Nebulization BID  . enalapril  20 mg Oral BID  . furosemide  80 mg Oral BID  . insulin aspart  0-15 Units Subcutaneous TID WC  . metoprolol tartrate  12.5 mg Oral Q8H  . montelukast  10 mg Oral QHS  . polyethylene glycol  17 g Oral Daily  . potassium chloride  20 mEq Oral TID  . rOPINIRole  2 mg Oral QHS  . traZODone  150 mg Oral QHS  . Warfarin - Pharmacist Dosing Inpatient   Does not apply q1800      . sodium chloride 10 mL/hr at 04/03/12 2154    ASSESSMENT AND PLAN: Phil Corti is an 67 y.o. female who initially presented to [facility] where she was admitted with an acute cerebellar infarct. The patient developed afib with RVR and was transferred her for further management. Patient underwent a cath with no significant CAD noted but TEE was performed as well that revealed an atrial thrombus and therefore patient was not cardioverted. Patient has been on Coumadin. INR sub-therapeutic  On 04/03/12 while talking with visitors she had acute onset of slurred speech and left sided weakness. Code stroke was called at that time.     Principal Problem:  *Atrial fibrillation with RVR - will decrease ACE to allow up-titration of BB, follow HR/BP, continue PO Amio  Active Problems:  CVA (cerebral infarction) - neuro and PT seeing, SNF placement after discharge   CHF (congestive heart failure), acute on chronic systolic class 4 - continue ACE at 5 mg BID, increase BB, she is now on oral Lasix   CAD (coronary artery disease) - no ischemic Sx. Grafts and stents patent at cath. ASA stopped with warfarin on board and thrombocytopenia. Has had some chest pain but ez remained negative.   Renal failure - follow   Anemia - guiac stools and check iron profile, follow    NSTEMI (non-ST elevated myocardial infarction) - med Rx for CAD  Morbid obesity/deconditioning - PT seeing, SNF at d/c - MD advise on starting bed search.  Chronic pain. PRN analgesics are written.   Thrombocytopenia: Stable  Foley cath - she does not want this out because she has chronic problems with leakage but discussed with patient and she agrees.  Signed, Theodore Demark , PA-C 7:44 AM 04/05/2012  I have seen, examined the patient, and reviewed the above assessment and plan.  Changes to above are made where necessary.  Stable CAD per recent cath.  Unfortunately, we must continue  rate control as she has a left atrial appendage thrombus by TEE and has had 2 recent cerebrovascular events.  Neurology assistance is appreciated. Will continue to titrate metoprolol.  Transfer to TCU today.  Consider SNF once rate controlled and return following 4 weeks of therapeutic coumadin for cardioversion.  Co Sign: Hillis Range, MD 04/05/2012 8:50 AM

## 2012-04-05 NOTE — Progress Notes (Signed)
Call from RN:  Patient just transferred to step down. After a trip to the bathroom, she became "Hazy" and had difficulty getting her words out. Neuro otherwise unchanged. afib w. RVR remains uncontrolled. Code Stroke called by unit Diplomatic Services operational officer.  As patient already with documented stroke this admission, will cancel code stroke. Lay flat til am. 500cc saline bolus. Call for additional neuro changes. RN to notify cardiology of afib issues.  Annie Main, MSN, RN, ANVP-BC, ANP-BC, GNP-BC [facility] Pager: [phone] 04/05/2012 3:18 PM

## 2012-04-05 NOTE — Progress Notes (Signed)
Pt having runs of v-tach .  Ranging 8-9 beats asymptomatic.  Notified Hurman Horn.

## 2012-04-05 NOTE — Progress Notes (Signed)
Stroke Team Progress Note  HISTORY Caitlyn Hardy is an 67 y.o. female who initially presented to [facility] where she was admitted with an acute cerebellar infarct. The patient developed afib with RVR and was transferred here 03/25/2012 for further management. Patient underwent a cath with no significant CAD noted but TEE was performed as well that revealed an atrial thrombus and therefore patient was not cardioverted. Patient has been on Coumadin. INR 1.8.   On 04/03/12 while talking with visitors she had acute onset of slurred speech and left sided weakness. Code stroke was called at that time. She was not a tPA candidate secondary to elevated INR, recent infarct.   SUBJECTIVE Son, who works with EMS and is a very good historian, is at the bedside.  OBJECTIVE Most recent Vital Signs: Filed Vitals:   04/05/12 0600 04/05/12 0644 04/05/12 0725 04/05/12 0802  BP: 101/54  111/65   Pulse: 70 96  153  Temp:      TempSrc:      Resp: 19 17    Height:      Weight:      SpO2: 100% 100%  93%   CBG (last 3)   Basename 04/04/12 2143 04/04/12 1631 04/04/12 1209  GLUCAP 148* 127* 118*   IV Fluid Intake:     . sodium chloride 10 mL/hr at 04/03/12 2154   MEDICATIONS    . acyclovir  800 mg Oral 5 X Daily  . amiodarone  400 mg Oral BID  . atorvastatin  10 mg Oral q1800  . budesonide  0.5 mg Nebulization BID  . enalapril  5 mg Oral BID  . furosemide  80 mg Oral BID  . insulin aspart  0-15 Units Subcutaneous TID WC  . metoprolol tartrate  25 mg Oral Q6H  . montelukast  10 mg Oral QHS  . polyethylene glycol  17 g Oral Daily  . potassium chloride  20 mEq Oral TID  . rOPINIRole  2 mg Oral QHS  . traZODone  150 mg Oral QHS  . Warfarin - Pharmacist Dosing Inpatient   Does not apply q1800   PRN:  sodium chloride, sodium chloride, acetaminophen, acetaminophen, HYDROcodone-acetaminophen, HYDROcodone-acetaminophen, levalbuterol, morphine injection, ondansetron (ZOFRAN) IV, ondansetron,  zolpidem  Diet:  Cardiac thin liquids Activity:  Up in chair, ok to ambulate DVT Prophylaxis:  On coumadin  CLINICALLY SIGNIFICANT STUDIES Basic Metabolic Panel:   Lab 04/05/12 0458 04/04/12 0500 04/03/12 1814 03/31/12 0129  NA 140 142 -- --  K 3.6 3.4* -- --  CL 101 102 -- --  CO2 33* 32 -- --  GLUCOSE 110* 134* -- --  BUN 18 21 -- --  CREATININE 1.24* 1.18* -- --  CALCIUM 8.7 8.5 -- --  MG -- -- 1.8 1.7  PHOS -- -- -- --   Liver Function Tests:   Lab 03/31/12 2330  AST 23  ALT 19  ALKPHOS 71  BILITOT 0.5  PROT 5.7*  ALBUMIN 3.1*   CBC:   Lab 04/05/12 0458 04/04/12 0500  WBC 5.7 5.3  NEUTROABS -- --  HGB 8.4* 8.8*  HCT 29.9* 29.8*  MCV 74.6* 72.7*  PLT 85* 82*   Coagulation:   Lab 04/05/12 0458 04/04/12 0500 04/03/12 0500 04/02/12 0435  LABPROT 19.1* 19.6* 21.0* 23.2*  INR 1.66* 1.72* 1.89* 2.16*   Cardiac Enzymes:   Lab 04/04/12 0528 04/04/12 0030 04/03/12 1839  CKTOTAL -- -- --  CKMB -- -- --  CKMBINDEX -- -- --  TROPONINI <0.30 <0.30 <  0.30   Urinalysis: No results found for this basename: COLORURINE:2,APPERANCEUR:2,LABSPEC:2,PHURINE:2,GLUCOSEU:2,HGBUR:2,BILIRUBINUR:2,KETONESUR:2,PROTEINUR:2,UROBILINOGEN:2,NITRITE:2,LEUKOCYTESUR:2 in the last 168 hours Lipid Panel No results found for this basename: chol,  trig,  hdl,  cholhdl,  vldl,  ldlcalc   HgbA1C  Lab Results  Component Value Date   HGBA1C 7.1* 03/26/2012   Urine Drug Screen:   No results found for this basename: labopia,  cocainscrnur,  labbenz,  amphetmu,  thcu,  labbarb    Alcohol Level: No results found for this basename: ETH:2 in the last 168 hours  Ct Head 04/03/2012  1.  Possible tiny, old right mid brain lacunar infarct. 2.  Streak artifacts crossing the pons, making it difficult to exclude a pontine infarct. 3.  No intracranial hemorrhage or definitive CT findings to indicate acute infarction. 4.  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   Chest Xray 04/03/2012  Mild bibasilar atelectasis or scarring.      MRI of the brain    MRA of the brain    2D Echocardiogram  - 03/26/2012 ejection fraction was 25%. Diffuse hypokinesis. There is septal flattening.  TEE - 04/01/12 EF 20% Left atrial thrombus  Carotid Doppler    EKG  Afib rate 97 BPM  Therapy Recommendations PT- SNF  Physical Exam   Neurologic Examination:  Mental Status:  Lethargic, oriented, thought content appropriate. Speech fluent without evidence of aphasia. Able to follow simple commands without difficulty.  Cranial Nerves:  II: Discs flat bilaterally; Visual fields grossly normal, pupils equal, round, reactive to light and accommodation  III,IV, VI: ptosis not present, extra-ocular motions intact bilaterally  V,VII: smile symmetric, facial light touch sensation normal bilaterally  VIII: hearing normal bilaterally  IX,X: gag reflex present  XI: bilateral shoulder shrug  XII: midline tongue extension  Motor:  Patient with generalized weakness and not holding any extremity off the bed for a prolonged period of time.  Sensory: Responds to noxious stimuli throughout  Deep Tendon Reflexes: 2+ in the upper extremities and absent in the lower extremities  Plantars:  Right: upgoing Left: downgoing  Cerebellar:  Patient will not cooperate to perform  Gait: Unable to perform  CV: pulses palpable throughout   ASSESSMENT Ms. Caitlyn Hardy is a 67 y.o. female who presented to [facility] with altered mental status and found to have a cerebellar infarct. Had new onset afib and was started on coumadin. She developed slurred speech and left sided weakness after transfer to [facility] while on coumadin with INR 1.8. TEE revealed incidental cardiac thrombus. MRI pending. Infarct(s) felt to be embolic secondary to two known possible sources, left atrial thrombus and atrial fibrillation. Work up underway. On warfarin prior to admission. Now on warfarin for secondary stroke prevention. Given 2 strokes, with  second one occurring on coumadin, consider IV heparin Patient with resultant left hemiparesis.   Left atrial thrombus  Afib  CAD   Hypertension  Cardiomyopathy / CHF  COPD Diabetes, HgbA1c 7.1 Cardiomyopathy EF 25% Bilateral foot pain yesterday, too severe to wear socks. Improved today. Hx gout. Edema present yesterday is improving.  Hospital day # 11  TREATMENT/PLAN  Continue coumadin for atrial thrombus and secondary stroke prevention. Consider changing to pradaxa pending MRI results  F/u MRI. Added MRA for complete evalution  Consider heparin pending MRI results  F/u carotid dopplers  Add OT eval  Check lipid panel  Dr. Pearlean Brownie discussed with Dr. Excell Seltzer who agrees with recommendation of  changing coumadin to pradaxa.  SHARON BIBY, MSN, RN, ANVP-BC, ANP-BC,  GNP-BC [facility] Pager: [phone] 04/05/2012 8:32 AM  I have personally obtained a history, examined the patient, evaluated imaging results, and formulated the assessment and plan of care. I agree with the above.  Delia Heady, MD Medical Director [facility] Pager: [phone] 04/05/2012 2:28 PM

## 2012-04-05 NOTE — Progress Notes (Signed)
ANTICOAGULATION CONSULT NOTE - Follow Up Consult  Pharmacy Consult for warfarin Indication: atrial fibrillation and LAA thrombus  Vital Signs: Temp: 98.4 F (36.9 C) (01/06 0435) Temp src: Oral (01/06 0435) BP: 111/65 mmHg (01/06 0725) Pulse Rate: 153  (01/06 0802)  Labs:  Alvira Philips 04/05/12 0458 04/04/12 0528 04/04/12 0500 04/04/12 0030 04/03/12 1839 04/03/12 1814 04/03/12 0500  HGB 8.4* -- 8.8* -- -- -- --  HCT 29.9* -- 29.8* -- -- -- 29.8*  PLT 85* -- 82* -- -- -- 91*  APTT -- -- -- -- -- -- --  LABPROT 19.1* -- 19.6* -- -- -- 21.0*  INR 1.66* -- 1.72* -- -- -- 1.89*  HEPARINUNFRC -- -- -- -- -- -- --  CREATININE 1.24* -- 1.18* -- -- 1.31* --  CKTOTAL -- -- -- -- -- -- --  CKMB -- -- -- -- -- -- --  TROPONINI -- <0.30 -- <0.30 <0.30 -- --    Estimated Creatinine Clearance: 56 ml/min (by C-G formula based on Cr of 1.24).  Assessment: 67 y.o. female with new onset Afib, cva, and LAA thrombus on coumadin per pharmacy. 1/4 code stroke called likely recurrent embolic stroke. Today's INR continues to trend down to 1.6. Will boost with slightly higher dose of coumadin for tonight. No noted bleeding, CBC low but stable. PLT's trending down but stable in 80s  Goal of Therapy:  INR 2-3 Monitor platelets by anticoagulation protocol: Yes   Plan:  1. Coumadin 7.5 mg x 1 tonight 2. F/u INR in AM 3. Monitor s/sx of bleeding  Neldon Newport D Clinical Pharmacist Phone [phone] 04/05/2012 9:18 AM

## 2012-04-05 NOTE — Progress Notes (Signed)
Called by RN because of elevated HR. Pt also had "hazy" feeling with speech difficulties, neuro has already seen for this and no new treatments recommended. Pt has had all scheduled meds for now, repeat doses BB/Amio pending. SBP approx. 100, limiting options.  Spoke with PR, will go back to IV Amio for now (no bolus) and re-assess in am.  Theodore Demark  Results reviewed.  Plan to switch back to IV amio  Follow.  Dietrich Pates 4:28 PM

## 2012-04-05 NOTE — Progress Notes (Signed)
Pt called nurse to room.  States she just feels hazy.  States she felt this way Saturday when she was having a tia.    Neuro assessment normal other than some expressive aphasia.  Called code stroke.  Spoke with [place] . Canceled code stroke .  Orders received.

## 2012-04-05 NOTE — Evaluation (Signed)
Physical Therapy Evaluation Patient Details Name: Caitlyn Hardy MRN: [id number] DOB: 1946-02-08 Today's Date: 04/05/2012 Time: 4098-1191 PT Time Calculation (min): 17 min  PT Assessment / Plan / Recommendation Clinical Impression  Pt admitted with infarct to [facility] and found to have atrial thrombus with right mid brain infarct with acute left weakness on 1/4 but does not demonstrate unilateral weakness at this time. Pt activity limited by cardiopulmonary status and cardiology aware and will progress activity as HR allows. Pt does not have 24 hr assist at home and until pt can return for medical management of cardiac issues to return to independence recommend ST-SNF. Will follow acutely to maximize function and independence to decrease burden of care.     PT Assessment  Patient needs continued PT services    Follow Up Recommendations  SNF;Supervision for mobility/OOB    Does the patient have the potential to tolerate intense rehabilitation      Barriers to Discharge Decreased caregiver support      Equipment Recommendations  None recommended by PT    Recommendations for Other Services     Frequency Min 3X/week    Precautions / Restrictions Precautions Precautions: Fall Precaution Comments: contact with shingles wounds on left buttock   Pertinent Vitals/Pain HR 100 at rest with variance to 130 with in bed mobility, sitting EOB up to 145 with pivot HR up to 158 non-sustained HR back to 130 in chair at rest, cardiology present and aware sats 93% on RA HA 5/10 And pt with left buttock pain from shingles with RN aware of request for pain meds      Mobility  Bed Mobility Bed Mobility: Supine to Sit;Sitting - Scoot to Edge of Bed Supine to Sit: HOB elevated;5: Supervision;With rails (HOB 25degrees) Sitting - Scoot to Edge of Bed: 6: Modified independent (Device/Increase time) Details for Bed Mobility Assistance: cueing and increased time with transfer to left side of  bed with rail Transfers Transfers: Sit to Stand;Stand to Sit;Stand Pivot Transfers Sit to Stand: 4: Min guard;From bed Stand to Sit: 4: Min guard;To chair/3-in-1 Stand Pivot Transfers: 4: Min assist;With armrests Details for Transfer Assistance: cueing for hand placement, sequence and safety activity limited by HR up to 158 with pivot Ambulation/Gait Ambulation/Gait Assistance: Not tested (comment)    Shoulder Instructions     Exercises     PT Diagnosis: Difficulty walking  PT Problem List: Decreased activity tolerance;Decreased mobility;Decreased knowledge of use of DME PT Treatment Interventions: Gait training;DME instruction;Functional mobility training;Therapeutic activities;Therapeutic exercise;Patient/family education   PT Goals Acute Rehab PT Goals PT Goal Formulation: With patient Time For Goal Achievement: 04/19/12 Potential to Achieve Goals: Fair Pt will go Supine/Side to Sit: with HOB 0 degrees;with modified independence PT Goal: Supine/Side to Sit - Progress: Goal set today Pt will go Sit to Supine/Side: with modified independence;with HOB 0 degrees PT Goal: Sit to Supine/Side - Progress: Goal set today Pt will go Sit to Stand: with supervision PT Goal: Sit to Stand - Progress: Goal set today Pt will go Stand to Sit: with supervision PT Goal: Stand to Sit - Progress: Goal set today Pt will Transfer Bed to Chair/Chair to Bed: with supervision PT Transfer Goal: Bed to Chair/Chair to Bed - Progress: Goal set today Pt will Ambulate: 51 - 150 feet;with supervision;with least restrictive assistive device PT Goal: Ambulate - Progress: Goal set today  Visit Information  Last PT Received On: 04/05/12 Assistance Needed: +1    Subjective Data  Subjective: I live by myself  and don't have anyone to help Patient Stated Goal: get back to moving   Prior Functioning  Home Living Lives With: Alone Available Help at Discharge: Available PRN/intermittently;Friend(s) Type of  Home: Apartment Home Access: Elevator Home Layout: One level Bathroom Shower/Tub: Engineer, manufacturing systems: Standard Home Adaptive Equipment: Walker - rolling;Straight cane;Tub transfer bench;Wheelchair - powered Prior Function Level of Independence: Independent Able to Take Stairs?: No Vocation: Retired Comments: Pt used to work as Lawyer in [employer]: No difficulties    Cognition  Overall Cognitive Status: Appears within functional limits for tasks assessed/performed Arousal/Alertness: Awake/alert Orientation Level: Appears intact for tasks assessed Behavior During Session: [facility] for tasks performed    Extremity/Trunk Assessment Right Upper Extremity Assessment RUE ROM/Strength/Tone: WFL for tasks assessed RUE Sensation: WFL - Light Touch Left Upper Extremity Assessment LUE ROM/Strength/Tone: Deficits LUE ROM/Strength/Tone Deficits: pt with limited shoulder ROM at baseline, pt reports "chipped bone from flu shot" grossly 100degrees shoulder flexion LUE Sensation: WFL - Light Touch Right Lower Extremity Assessment RLE ROM/Strength/Tone: [facility] for tasks assessed (3+/5 hip flexion and knee extension) Left Lower Extremity Assessment LLE ROM/Strength/Tone: [facility] for tasks assessed (4/5 hip flexion and knee extension) Trunk Assessment Trunk Assessment: Normal   Balance Static Sitting Balance Static Sitting - Balance Support: Feet supported;No upper extremity supported Static Sitting - Level of Assistance: 6: Modified independent (Device/Increase time) Static Sitting - Comment/# of Minutes: 3  End of Session PT - End of Session Activity Tolerance: Other (comment) (activity limited by HR) Patient left: in chair;with nursing in room;with call bell/phone within reach Nurse Communication: Mobility status  GP     Delorse Lek 04/05/2012, 8:14 AM  Delaney Meigs, PT [phone]

## 2012-04-05 NOTE — Progress Notes (Signed)
Spoke with Theodore Demark and notifed of pts new neuro symptoms. And pt continuing to be in a fib today with rate in the 120 and 130's.

## 2012-04-05 NOTE — Progress Notes (Signed)
Dr. Pearlean Brownie has reviewed the MRI. Patient safe for anticoagulation. Will change to pradaxa based on discussion between Dr. Excell Seltzer and Dr. Pearlean Brownie this am. Notified pharmacy.  Annie Main, MSN, RN, ANVP-BC, ANP-BC, [facility] Pager: [phone] 04/05/2012 1:37 PM

## 2012-04-05 NOTE — Progress Notes (Signed)
Pt crying uncontrollably.  Asked pt why she was crying. States she is crying partially cause she is upset ,  States the other reason is because her head is roaring inside.  Called and notifed [employer].

## 2012-04-06 LAB — IRON AND TIBC: UIBC: 348 ug/dL (ref 125–400)

## 2012-04-06 LAB — RETICULOCYTES
RBC.: 3.9 MIL/uL (ref 3.87–5.11)
Retic Count, Absolute: 81.9 10*3/uL (ref 19.0–186.0)

## 2012-04-06 LAB — CBC
HCT: 28.9 % — ABNORMAL LOW (ref 36.0–46.0)
Hemoglobin: 8.4 g/dL — ABNORMAL LOW (ref 12.0–15.0)
MCH: 21.5 pg — ABNORMAL LOW (ref 26.0–34.0)
MCHC: 29.1 g/dL — ABNORMAL LOW (ref 30.0–36.0)

## 2012-04-06 LAB — GLUCOSE, CAPILLARY
Glucose-Capillary: 120 mg/dL — ABNORMAL HIGH (ref 70–99)
Glucose-Capillary: 122 mg/dL — ABNORMAL HIGH (ref 70–99)
Glucose-Capillary: 88 mg/dL (ref 70–99)
Glucose-Capillary: 130 mg/dL — ABNORMAL HIGH (ref 70–99)

## 2012-04-06 LAB — LIPID PANEL
LDL Cholesterol: 44 mg/dL (ref 0–99)
Triglycerides: 90 mg/dL (ref <150)
VLDL: 18 mg/dL (ref 0–40)

## 2012-04-06 LAB — BASIC METABOLIC PANEL
BUN: 21 mg/dL (ref 6–23)
Calcium: 8.7 mg/dL (ref 8.4–10.5)
GFR calc non Af Amer: 43 mL/min — ABNORMAL LOW (ref >90)
Glucose, Bld: 124 mg/dL — ABNORMAL HIGH (ref 70–99)
Sodium: 139 mEq/L (ref 135–145)

## 2012-04-06 LAB — FERRITIN: Ferritin: 27 ng/mL (ref 10–291)

## 2012-04-06 MED ORDER — METOPROLOL TARTRATE 50 MG PO TABS
50.0000 mg | ORAL_TABLET | Freq: Four times a day (QID) | ORAL | Status: DC
  Administered 2012-04-06 – 2012-04-10 (×13): 50 mg via ORAL
  Filled 2012-04-06 (×20): qty 1

## 2012-04-06 NOTE — Progress Notes (Signed)
Stroke Team Progress Note  HISTORY Caitlyn Hardy is an 67 y.o. female who initially presented to [facility] where she was admitted with an acute cerebellar infarct. The patient developed afib with RVR and was transferred here 03/25/2012 for further management. Patient underwent a cath with no significant CAD noted but TEE was performed as well that revealed an atrial thrombus and therefore patient was not cardioverted. Patient has been on Coumadin. INR 1.8.   On 04/03/12 while talking with visitors she had acute onset of slurred speech and left sided weakness. Code stroke was called at that time. She was not a tPA candidate secondary to elevated INR, recent infarct.   SUBJECTIVE No complaints. Yesterday "haziness" and word finding difficulties are now resolved. Just walked with PT.  OBJECTIVE Most recent Vital Signs: Filed Vitals:   04/06/12 0400 04/06/12 0500 04/06/12 0600 04/06/12 0730  BP: 95/52 98/60 106/59 110/77  Pulse:   114 104  Temp:    98 F (36.7 C)  TempSrc:    Oral  Resp: 17 18 18 19   Height:      Weight:  134.2 kg (295 lb 13.7 oz)    SpO2: 98% 99% 100% 100%   CBG (last 3)   Basename 04/06/12 0740 04/05/12 1942 04/05/12 1702  GLUCAP 120* 173* 118*   IV Fluid Intake:      . sodium chloride 10 mL/hr at 04/03/12 2154  . amiodarone (NEXTERONE PREMIX) 360 mg/200 mL dextrose 60 mg/hr (04/06/12 0307)   MEDICATIONS     . acyclovir  800 mg Oral 5 X Daily  . atorvastatin  10 mg Oral q1800  . budesonide  0.5 mg Nebulization BID  . dabigatran  150 mg Oral Q12H  . enalapril  2.5 mg Oral BID  . furosemide  80 mg Oral BID  . insulin aspart  0-15 Units Subcutaneous TID WC  . metoprolol tartrate  50 mg Oral Q6H  . montelukast  10 mg Oral QHS  . polyethylene glycol  17 g Oral Daily  . potassium chloride  20 mEq Oral TID  . rOPINIRole  2 mg Oral QHS  . traZODone  150 mg Oral QHS   PRN:  sodium chloride, sodium chloride, acetaminophen, acetaminophen,  HYDROcodone-acetaminophen, HYDROcodone-acetaminophen, levalbuterol, morphine injection, ondansetron (ZOFRAN) IV, ondansetron, zolpidem  Diet:  Cardiac thin liquids Activity:  Up in chair, ok to ambulate DVT Prophylaxis:  On coumadin  CLINICALLY SIGNIFICANT STUDIES Basic Metabolic Panel:   Lab 04/06/12 0443 04/05/12 0458 04/03/12 1814 03/31/12 0129  NA 139 140 -- --  K 3.9 3.6 -- --  CL 100 101 -- --  CO2 32 33* -- --  GLUCOSE 124* 110* -- --  BUN 21 18 -- --  CREATININE 1.27* 1.24* -- --  CALCIUM 8.7 8.7 -- --  MG -- -- 1.8 1.7  PHOS -- -- -- --   Liver Function Tests:   Lab 03/31/12 2330  AST 23  ALT 19  ALKPHOS 71  BILITOT 0.5  PROT 5.7*  ALBUMIN 3.1*   CBC:   Lab 04/06/12 0443 04/05/12 0458  WBC 5.4 5.7  NEUTROABS -- --  HGB 8.4* 8.4*  HCT 28.9* 29.9*  MCV 74.1* 74.6*  PLT 96* 85*   Coagulation:   Lab 04/05/12 0458 04/04/12 0500 04/03/12 0500 04/02/12 0435  LABPROT 19.1* 19.6* 21.0* 23.2*  INR 1.66* 1.72* 1.89* 2.16*   Cardiac Enzymes:   Lab 04/04/12 0528 04/04/12 0030 04/03/12 1839  CKTOTAL -- -- --  CKMB -- -- --  CKMBINDEX -- -- --  TROPONINI <0.30 <0.30 <0.30   Urinalysis: No results found for this basename: COLORURINE:2,APPERANCEUR:2,LABSPEC:2,PHURINE:2,GLUCOSEU:2,HGBUR:2,BILIRUBINUR:2,KETONESUR:2,PROTEINUR:2,UROBILINOGEN:2,NITRITE:2,LEUKOCYTESUR:2 in the last 168 hours  Lipid Panel     Component Value Date/Time   CHOL 88 04/06/2012 0443   TRIG 90 04/06/2012 0443   HDL 26* 04/06/2012 0443   CHOLHDL 3.4 04/06/2012 0443   VLDL 18 04/06/2012 0443   LDLCALC 44 04/06/2012 0443   HgbA1C  Lab Results  Component Value Date   HGBA1C 7.1* 03/26/2012   Urine Drug Screen:   No results found for this basename: labopia,  cocainscrnur,  labbenz,  amphetmu,  thcu,  labbarb    Alcohol Level: No results found for this basename: ETH:2 in the last 168 hours  Ct Head 04/03/2012  1.  Possible tiny, old right mid brain lacunar infarct. 2.  Streak artifacts crossing the  pons, making it difficult to exclude a pontine infarct. 3.  No intracranial hemorrhage or definitive CT findings to indicate acute infarction. 4.  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   Chest Xray 04/03/2012 Mild bibasilar atelectasis or scarring.       MRI of the brain 04/05/2012 1.  Patchy acute to subacute right cerebellar (SCA) territory infarcts.  No mass effect or hemorrhage. 2.  Questionable acute to subacute punctate right pontine and right MCA territory infarcts. The supra tentorial foci and might represent synchronous small vessel disease.    MRA of the brain  04/05/2012  1.  Decreased flow in the distal left vertebral artery which may be stenotic and/or nondominant.  It functionally terminates in the left PICA which appears remain patent.  2.  Dominant right vertebral artery and basilar artery without stenosis.  Bilateral PICA, AICA, and SCA origins and proximal segments appear patent.  3.  Fetal type right PCA origin.  Remaining posterior circulation and visualized anterior circulation otherwise within normal limits.   2D Echocardiogram  - 03/26/2012 ejection fraction was 25%. Diffuse hypokinesis. There is septal flattening.  TEE - 04/01/12 EF 20% Left atrial thrombus  Carotid Doppler  Right - No evidence of significant ICA stenosis. Vertebral artery flow is antegrade. Left - There is elevation of velocities throughout the carotid artery with no evidence of significant plaque. May be due to a cardiac component. Patient with atrial fibrillation. Vertebral artery flow is antegrade with a spiked component consistent with a possible more distal obstruction.  EKG  Afib rate 97 BPM  Therapy Recommendations PT- SNF  Physical Exam   Neurologic Examination:  Mental Status:  Lethargic, oriented, thought content appropriate. Speech fluent without evidence of aphasia. Able to follow simple commands without difficulty.  Cranial Nerves:  II: Discs flat bilaterally; Visual fields  grossly normal, pupils equal, round, reactive to light and accommodation  III,IV, VI: ptosis not present, extra-ocular motions intact bilaterally  V,VII: smile symmetric, facial light touch sensation normal bilaterally  VIII: hearing normal bilaterally  IX,X: gag reflex present  XI: bilateral shoulder shrug  XII: midline tongue extension  Motor:  Patient with generalized weakness and not holding any extremity off the bed for a prolonged period of time.  Sensory: Responds to noxious stimuli throughout  Deep Tendon Reflexes: 2+ in the upper extremities and absent in the lower extremities  Plantars:  Right: upgoing Left: downgoing  Cerebellar:  Patient will not cooperate to perform  Gait: Unable to perform  CV: pulses palpable throughout   ASSESSMENT Ms. Caitlyn Hardy is a 67 y.o. female who presented to [facility] with  altered mental status and found to have a cerebellar infarct. Had new onset afib and was started on coumadin. She developed slurred speech and left sided weakness after transfer to [facility] while on coumadin with INR 1.8. TEE revealed incidental cardiac thrombus. MRI pending. Infarct(s) felt to be embolic secondary to two known possible sources, left atrial thrombus and atrial fibrillation. Work up underway. On warfarin prior to admission. Now on pradaxa for secondary stroke prevention.  Patient with resultant left hemiparesis.   Left atrial thrombus  Afib  CAD   Hypertension  Cardiomyopathy / CHF  COPD Diabetes, HgbA1c 7.1 Cardiomyopathy EF 25% Bilateral foot pain yesterday, too severe to wear socks. Improved today. Hx gout. Edema present yesterday is improving. LDL 44  Hospital day # 12  TREATMENT/PLAN  Continue pradaxa for secondary stroke prevention Stroke Service will sign off. Follow up with Dr. Pearlean Brownie, Stroke Clinic, in 2 months.  Annie Main, MSN, RN, ANVP-BC, ANP-BC, [facility] Pager: [phone] 04/06/2012 8:20 AM  I have  personally obtained a history, examined the patient, evaluated imaging results, and formulated the assessment and plan of care. I agree with the above.  Delia Heady, MD Medical Director [facility] Pager: [phone] 04/06/2012 8:20 AM

## 2012-04-06 NOTE — Progress Notes (Signed)
Patient Name: Caitlyn Hardy Date of Encounter: 04/06/2012  Principal Problem:  *Atrial fibrillation with RVR Active Problems:  CVA (cerebral infarction)  CAD (coronary artery disease)  Renal failure  Acute on chronic systolic congestive heart failure, NYHA class 4  Anemia  NSTEMI (non-ST elevated myocardial infarction)    SUBJECTIVE: No chest pain,   SOB is improved.  No neuro complaints presently  OBJECTIVE Filed Vitals:   04/06/12 0400 04/06/12 0500 04/06/12 0600 04/06/12 0730  BP: 95/52 98/60 106/59 110/77  Pulse:   114 104  Temp:    98 F (36.7 C)  TempSrc:    Oral  Resp: 17 18 18 19   Height:      Weight:  295 lb 13.7 oz (134.2 kg)    SpO2: 98% 99% 100% 100%    Intake/Output Summary (Last 24 hours) at 04/06/12 0749 Last data filed at 04/06/12 0700  Gross per 24 hour  Intake 1898.27 ml  Output   1725 ml  Net 173.27 ml   Filed Weights   04/04/12 0540 04/05/12 0500 04/06/12 0500  Weight: 294 lb 15.6 oz (133.8 kg) 283 lb 4.7 oz (128.5 kg) 295 lb 13.7 oz (134.2 kg)    PHYSICAL EXAM General: obese and chronically ill, female in no acute distress. Head: Normocephalic, atraumatic.  Neck: Supple without bruits, JVD about 8 cm - diff to assess secondary to body habitus. Lungs:  Resp regular and unlabored, decreased BS bases, rales. Heart: IRRR, S1, S2, no S3, S4, + murmur. Abdomen: Soft, non-tender, non-distended, BS + x 4.  Extremities: No clubbing, cyanosis, + dependant edema.  Neuro: Alert and oriented X 3. Moves all extremities spontaneously. Psych: Normal affect.  LABS: CBC:  Basename 04/06/12 0443 04/05/12 0458  WBC 5.4 5.7  NEUTROABS -- --  HGB 8.4* 8.4*  HCT 28.9* 29.9*  MCV 74.1* 74.6*  PLT 96* 85*   INR:  Basename 04/05/12 0458  INR 1.66*   Basic Metabolic Panel:  Basename 04/06/12 0443 04/05/12 0458 04/03/12 1814  NA 139 140 --  K 3.9 3.6 --  CL 100 101 --  CO2 32 33* --  GLUCOSE 124* 110* --  BUN 21 18 --  CREATININE 1.27* 1.24*  --  CALCIUM 8.7 8.7 --  MG -- -- 1.8  PHOS -- -- --   Cardiac Enzymes:  Basename 04/04/12 0528 04/04/12 0030 04/03/12 1839  CKTOTAL -- -- --  CKMB -- -- --  CKMBINDEX -- -- --  TROPONINI <0.30 <0.30 <0.30   TELE: afib with RVR, though V rates have improved overnight to 100-110s       Radiology/Studies: Ct Head Wo Contrast 04/03/2012  *RADIOLOGY REPORT*  Clinical Data: Dysphasia.  Left-sided weakness.  Nausea.  CT HEAD WITHOUT CONTRAST  Technique:  Contiguous axial images were obtained from the base of the skull through the vertex without contrast.  Comparison: None.  Findings: Tiny, linear area of well defined low density in the mid brain on the right.  There is some streak artifact crossing the pons with some associated low density in the anterior pons, greater on the left.  Otherwise, normal appearing cerebral hemispheres and posterior fossa structures.  Normal size and position of the ventricles.  No intracranial hemorrhage, mass lesion or CT evidence of acute infarction.  Left sphenoid sinus mucosal thickening and small air fluid level.  IMPRESSION:  1.  Possible tiny, old right mid brain lacunar infarct. 2.  Streak artifacts crossing the pons, making it difficult to exclude a pontine  infarct. 3.  No intracranial hemorrhage or definitive CT findings to indicate acute infarction. 4.  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  These results were called by telephone on 04/03/2012 at 1805 hours to Dr. Thad Ranger, who verbally acknowledged these results.   Original Report Authenticated By: Beckie Salts, M.D.    Dg Chest Port 1 View 04/03/2012  *RADIOLOGY REPORT*  Clinical Data: Short of breath and chest tightness  PORTABLE CHEST - 1 VIEW  Comparison: 03/29/2012  Findings: Right PICC tip in the right innominate vein is unchanged.  Cardiac enlargement with changes of CABG.  No   heart failure. Mild bibasilar atelectasis or scarring.  No significant effusion. Negative for pneumonia.   IMPRESSION: Mild bibasilar atelectasis or scarring.   Original Report Authenticated By: Janeece Riggers, M.D.    Current Medications:     . acyclovir  800 mg Oral 5 X Daily  . atorvastatin  10 mg Oral q1800  . budesonide  0.5 mg Nebulization BID  . dabigatran  150 mg Oral Q12H  . enalapril  2.5 mg Oral BID  . furosemide  80 mg Oral BID  . insulin aspart  0-15 Units Subcutaneous TID WC  . metoprolol tartrate  50 mg Oral Q6H  . montelukast  10 mg Oral QHS  . polyethylene glycol  17 g Oral Daily  . potassium chloride  20 mEq Oral TID  . rOPINIRole  2 mg Oral QHS  . traZODone  150 mg Oral QHS      . sodium chloride 10 mL/hr at 04/03/12 2154  . amiodarone (NEXTERONE PREMIX) 360 mg/200 mL dextrose 60 mg/hr (04/06/12 0307)    ASSESSMENT AND PLAN: Caitlyn Hardy is an 67 y.o. female who initially presented to [facility] where she was admitted with an acute cerebellar infarct. The patient developed afib with RVR and was transferred her for further management. Patient underwent a cath with patent grafts noted but TEE was performed as well that revealed an atrial thrombus and therefore patient was not cardioverted. Patient has been on Coumadin. INR sub-therapeutic and now switched to pradaxa by neurology. On 04/03/12 while talking with visitors she had acute onset of slurred speech and left sided weakness. Code stroke was called at that time.     Principal Problem:  *Atrial fibrillation with RVR - increase metoprolol to 50mg  BID, decrease IV amiodarone which was restarted by Dr Tenny Craw yesterday, hope to convert back to PO amiodarone tomorrow. Pradaxa as per neurology.  Follow [facility] closely.  Active Problems:  CVA (cerebral infarction) - neuro and PT seeing, SNF placement after discharge   CHF (congestive heart failure), acute on chronic systolic class 4 - continue ACE inhibitor and titrated once HR is controlled, increase metoprolol today, she is now on oral Lasix   CAD (coronary artery  disease) - no ischemic Sx. Grafts and stents patent at cath. ASA stopped with pradaxa on board and thrombocytopenia.     Renal failure - follow closely with diuresis   Anemia - guiac stools and check iron profile, follow   NSTEMI (non-ST elevated myocardial infarction) - med Rx for CAD  Morbid obesity/deconditioning - PT seeing, SNF at d/c   Chronic pain. PRN analgesics are written.   Thrombocytopenia: Stable  Consider SNF once rate controlled and return following 4 weeks of therapeutic pradaxa for cardioversion.  Hillis Range, MD 04/06/2012 7:49 AM

## 2012-04-06 NOTE — Progress Notes (Signed)
Physical Therapy Treatment Patient Details Name: Caitlyn Hardy MRN: [id number] DOB: 1945/12/20 Today's Date: 04/06/2012 Time: 0752-0825 PT Time Calculation (min): 33 min  PT Assessment / Plan / Recommendation Comments on Treatment Session  Pt admitted with atrial thrombus and mid brain infarct with Afib. Pt without neurologic symptoms this morning per RN and pt and improved HR. Pt HR 125-135 through majority of mobility with a few spikes to 144 but much lower than rate yesterday with activity. Will continue to follow and encouraged increased mobility with nursing.                                        Plan Discharge plan remains appropriate;Frequency remains appropriate    Precautions / Restrictions Precautions Precautions: Fall Precaution Comments: contact with shingles wounds on left buttock, HR max 160 with activity Restrictions Weight Bearing Restrictions: No   Pertinent Vitals/Pain 6/10 back pain 8/10 HA repositioned and notified to contact RN for meds as needed    Mobility  Bed Mobility Bed Mobility: Supine to Sit;Sitting - Scoot to Edge of Bed Supine to Sit: 6: Modified independent (Device/Increase time);With rails;HOB flat Sitting - Scoot to Edge of Bed: 6: Modified independent (Device/Increase time) Transfers Sit to Stand: 4: Min guard;From bed;From chair/3-in-1;From toilet Stand to Sit: 4: Min guard;To chair/3-in-1;With armrests;To toilet Details for Transfer Assistance: cueing for hand placement as pt has tendency to maintain grasp on RW with transfers. x 3 trials Ambulation/Gait Ambulation/Gait Assistance: 5: Supervision Ambulation Distance (Feet): 80 Feet (15', 80') Assistive device: Rolling walker;None Ambulation/Gait Assistance Details: Pt ambulated first 26' without AD with increased unsteadiness and improved steadiness and function with RW use but limited by fatigue.  Gait Pattern: Step-through pattern;Decreased stride length;Trunk flexed Gait velocity:  decreased Stairs: No    Exercises General Exercises - Lower Extremity Long Arc Quad: AROM;10 reps;Both;Seated Hip Flexion/Marching: AROM;Both;10 reps;Seated   PT Diagnosis:    PT Problem List:   PT Treatment Interventions:     PT Goals Acute Rehab PT Goals PT Goal: Supine/Side to Sit - Progress: Met PT Goal: Sit to Stand - Progress: Progressing toward goal PT Goal: Stand to Sit - Progress: Progressing toward goal PT Goal: Ambulate - Progress: Progressing toward goal  Visit Information  Last PT Received On: 04/06/12 Assistance Needed: +1    Subjective Data  Subjective: I feel better no more haziness   Cognition  Overall Cognitive Status: Appears within functional limits for tasks assessed/performed Arousal/Alertness: Awake/alert Orientation Level: Appears intact for tasks assessed Behavior During Session: [facility] for tasks performed    Balance  Static Sitting Balance Static Sitting - Balance Support: No upper extremity supported;Feet supported Static Sitting - Level of Assistance: 7: Independent Static Sitting - Comment/# of Minutes: 4  End of Session PT - End of Session Equipment Utilized During Treatment: Gait belt Activity Tolerance: Patient tolerated treatment well Patient left: in chair;with call bell/phone within reach Nurse Communication: Mobility status   GP     Delorse Lek 04/06/2012, 9:12 AM Delaney Meigs, PT [phone]

## 2012-04-06 NOTE — Progress Notes (Signed)
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 PTA, pt lived independently in apt and was @ mod I level with ADL and mobility. Feel pt will benefit from short stay at rehab at SNF prior to return home to live independently. Pt will benefit from skilled OT services to max independence with ADL and functional mobility for ADL, secondary to below deficits, to facilitate D/C to SNF for rehab.     OT Assessment  Patient needs continued OT Services    Follow Up Recommendations  SNF    Barriers to Discharge Decreased caregiver support    Equipment Recommendations  None recommended by OT    Recommendations for Other Services  none  Frequency  Min 2X/week    Precautions / Restrictions Precautions Precautions: Fall Restrictions Weight Bearing Restrictions: No   Pertinent Vitals/Pain No c/o pain. HR 98 - 122    ADL  Eating/Feeding: Independent Where Assessed - Eating/Feeding: Chair Grooming: Supervision/safety;Set up Where Assessed - Grooming: Unsupported sitting Upper Body Bathing: Set up;Supervision/safety Where Assessed - Upper Body Bathing: Supported sitting Lower Body Bathing: Moderate assistance Where Assessed - Lower Body Bathing: Supported sit to stand Upper Body Dressing: Set up;Supervision/safety Where Assessed - Upper Body Dressing: Supported sitting Lower Body Dressing: Moderate assistance Where Assessed - Lower Body Dressing: Supported sit to stand Toilet Transfer: Minimal assistance Toilet Transfer Method: Sit to stand;Stand pivot Acupuncturist: Bedside commode Toileting - Clothing Manipulation and Hygiene: Supervision/safety Where Assessed - Medical sales representative and Hygiene: Standing Equipment Used: Gait belt;Rolling walker Transfers/Ambulation Related to ADLs: min A ADL Comments: Uses AE at home. states IADL tasks were difficult for her PTA    OT Diagnosis: Generalized weakness  OT Problem List: Decreased strength;Decreased activity tolerance;Decreased knowledge of use of DME or AE;Cardiopulmonary status limiting activity;Obesity;Increased edema OT Treatment Interventions: Self-care/ADL training;Therapeutic exercise;Energy conservation;DME and/or AE instruction;Therapeutic activities;Patient/family education   OT Goals Acute Rehab OT Goals OT Goal Formulation: With patient Time For Goal Achievement: 04/20/12 Potential to Achieve Goals: Good ADL Goals Pt Will Perform Grooming: with supervision;Standing at sink;Unsupported ADL Goal: Grooming - Progress: Goal set today Pt Will Perform Lower Body Bathing: with supervision;with set-up;Sit to stand from chair;Unsupported;with adaptive equipment ADL Goal: Lower Body Bathing - Progress: Goal set today Pt Will Transfer to Toilet: with supervision;Ambulation;with DME;3-in-1 ADL Goal: Toilet Transfer - Progress: Goal set today Additional ADL Goal #1: Pt will verbalize 2 E conservation techniques to use during ADL task ADL Goal: Additional Goal #1 - Progress: Goal set today  Visit Information  Last OT Received On: 04/06/12 Assistance Needed: +1    Subjective Data   I dont' want to live everyday afraid that I'm going to have another stroke.   Prior Functioning     Home Living Lives With: Alone Available Help at Discharge: Family;Available PRN/intermittently (son works) Type of Home: Apartment Home Access: Elevator Home Layout: One level Bathroom Shower/Tub: Engineer, manufacturing systems: Handicapped height Bathroom Accessibility: Yes How Accessible: Accessible via walker Home Adaptive Equipment: Grab bars around toilet;Grab bars in shower Prior Function Level of  Independence: Independent;Independent with assistive device(s) Able to Take Stairs?: No Driving: Yes Vocation: Retired Comments: lives in apt for disabled and senior citizens. uses RW or power chair in apt.  at times doe not use anything. uses DME due to knees Communication Communication: No difficulties Dominant Hand: Right         Vision/Perception Vision - Assessment Eye Alignment: Within Functional Limits Vision Assessment: Vision tested Ocular Range of Motion: Within Functional Limits Alignment/Gaze Preference: Within Defined Limits Tracking/Visual Pursuits: Able to track stimulus in all quads without difficulty Saccades: Within functional limits Convergence: Within functional limits Visual Fields: No apparent deficits Perception Perception: Within Functional Limits Praxis Praxis: Intact   Cognition  Overall Cognitive Status: Appears within functional limits for tasks assessed/performed Arousal/Alertness: Awake/alert Orientation Level: Appears intact for tasks assessed Behavior During Session: [facility] for tasks performed    Extremity/Trunk Assessment Right Upper Extremity Assessment RUE ROM/Strength/Tone: Deficits RUE ROM/Strength/Tone Deficits: prior shoulder problems, otherwise WFL RUE Sensation: WFL - Light Touch RUE Coordination: WFL - gross/fine motor Left Upper Extremity Assessment LUE ROM/Strength/Tone: Deficits (BLE edema) LUE ROM/Strength/Tone Deficits: limited shoulder ROM PTA, otherwise baseline LUE Sensation: WFL - Light Touch LUE Coordination: WFL - gross/fine motor Right Lower Extremity Assessment RLE ROM/Strength/Tone: Deficits;Due to pain (knee) RLE Sensation: WFL - Light Touch RLE Coordination: WFL - gross/fine motor Left Lower Extremity Assessment LLE ROM/Strength/Tone: Deficits;Due to pain LLE ROM/Strength/Tone Deficits: knee pain LLE Sensation: WFL - Light Touch;WFL - Proprioception LLE Coordination: WFL - gross/fine motor Trunk  Assessment Trunk Assessment: Normal     Mobility Bed Mobility Bed Mobility: Not assessed Transfers Transfers: Sit to Stand;Stand to Sit Sit to Stand: With upper extremity assist;From chair/3-in-1;4: Min assist Stand to Sit: 4: Min guard;With upper extremity assist;To chair/3-in-1 Details for Transfer Assistance: vc for controlled descent               Balance Balance Balance Assessed: Yes Static Sitting Balance Static Sitting - Balance Support: No upper extremity supported;Feet supported Static Sitting - Level of Assistance: 7: Independent Static Sitting - Comment/# of Minutes: 10 min Dynamic Sitting Balance Dynamic Sitting - Balance Support: No upper extremity supported;Feet supported Dynamic Sitting - Level of Assistance: 7: Independent Static Standing Balance Static Standing - Balance Support: Bilateral upper extremity supported Static Standing - Level of Assistance: 6: Modified independent (Device/Increase time) Static Standing - Comment/# of Minutes: 5   End of Session OT - End of Session Equipment Utilized During Treatment: Gait belt Activity Tolerance: Patient tolerated treatment well Patient left: in chair;with call bell/phone within reach Nurse Communication: Mobility status  GO     Kanav Kazmierczak,HILLARY 04/06/2012, 4:59 PM [facility], OTR/L  [phone] 04/06/2012

## 2012-04-06 NOTE — Progress Notes (Signed)
VASCULAR LAB PRELIMINARY  PRELIMINARY  PRELIMINARY  PRELIMINARY  Carotid duplex completed.    Preliminary report:  Right - No evidence of significant ICA stenosis. Vertebral artery flow is antegrade. Left - There is elevation of velocities throughout the carotid artery with no evidence of significant plaque. May be due to a cardiac component. Patient with atrial fibrillation. Vertebral artery flow is antegrade with a spiked component consistent with a possible more distal obstruction.  Haik Mahoney, RVS 04/06/2012, 9:50 AM

## 2012-04-07 LAB — CBC
HCT: 27.3 % — ABNORMAL LOW (ref 36.0–46.0)
MCHC: 28.9 g/dL — ABNORMAL LOW (ref 30.0–36.0)
Platelets: 108 10*3/uL — ABNORMAL LOW (ref 150–400)
RDW: 20.4 % — ABNORMAL HIGH (ref 11.5–15.5)
WBC: 4.8 10*3/uL (ref 4.0–10.5)

## 2012-04-07 LAB — BASIC METABOLIC PANEL
Chloride: 97 mEq/L (ref 96–112)
GFR calc Af Amer: 47 mL/min — ABNORMAL LOW (ref >90)
GFR calc non Af Amer: 41 mL/min — ABNORMAL LOW (ref >90)
Potassium: 4.1 mEq/L (ref 3.5–5.1)
Sodium: 139 mEq/L (ref 135–145)

## 2012-04-07 LAB — GLUCOSE, CAPILLARY
Glucose-Capillary: 123 mg/dL — ABNORMAL HIGH (ref 70–99)
Glucose-Capillary: 102 mg/dL — ABNORMAL HIGH (ref 70–99)

## 2012-04-07 NOTE — Progress Notes (Signed)
Patient ID: Caitlyn Hardy, female   DOB: 10-24-1945, 67 y.o.   MRN: [id number] Subjective:  No chest pain or sob. No palpitations.  Objective:  Vital Signs in the last 24 hours: Temp:  [98 F (36.7 C)-98.7 F (37.1 C)] 98.7 F (37.1 C) (01/08 0400) Pulse Rate:  [44-162] 131  (01/08 0324) Resp:  [19-24] 20  (01/08 0619) BP: (78-110)/(54-72) 100/54 mmHg (01/08 0619) SpO2:  [80 %-99 %] 96 % (01/08 0619)  Intake/Output from previous day: 01/07 0701 - 01/08 0700 In: 839.9 [P.O.:240; I.V.:599.9] Out: 300 [Urine:300] Intake/Output from this shift:    Physical Exam: Well appearing obese woman, NAD HEENT: Unremarkable Neck:  No JVD, no thyromegally Lungs:  Clear with no wheezes, rales, or rhonchi HEART:  IRegular rate rhythm, no murmurs, no rubs, no clicks Abd:  soft, obese, positive bowel sounds, no organomegally, no rebound, no guarding Ext:  2 plus pulses, no edema, no cyanosis, no clubbing Skin:  No rashes no nodules Neuro:  CN II through XII intact, motor grossly intact  Lab Results:  Basename 04/07/12 0500 04/06/12 0443  WBC 4.8 5.4  HGB 7.9* 8.4*  PLT 108* 96*    Basename 04/07/12 0500 04/06/12 0443  NA 139 139  K 4.1 3.9  CL 97 100  CO2 32 32  GLUCOSE 161* 124*  BUN 19 21  CREATININE 1.33* 1.27*   No results found for this basename: TROPONINI:2,CK,MB:2 in the last 72 hours Hepatic Function Panel No results found for this basename: PROT,ALBUMIN,AST,ALT,ALKPHOS,BILITOT,BILIDIR,IBILI in the last 72 hours  Basename 04/06/12 0443  CHOL 88   No results found for this basename: PROTIME in the last 72 hours  Imaging: Mr Shirlee Latch Wo Contrast  04/05/2012  *RADIOLOGY REPORT*  Clinical Data:  67 year old female with left side weakness, nausea.  Comparison: Head CT without contrast 04/03/2012.  MRI HEAD WITHOUT CONTRAST  Technique: Multiplanar, multiecho pulse sequences of the brain and surrounding structures were obtained according to standard protocol without intravenous  contrast.  Findings: Patchy areas of mild to moderately restricted diffusion in the right cerebellum, primarily the superior cerebellar artery territory (series 5 images six and seven).  Associated T2 and subtle FLAIR hyperintensity.  No associated mass effect or evidence of acute hemorrhage.  Superimposed chronic left PICA infarct.  Questionable punctate area of restricted diffusion in the upper right pons (series 5 image 10).  No other associated signal abnormality.  The brainstem is otherwise within normal limits.  There is a small focus of encephalomalacia at the left lateral temporal lobe with either chronic hemosiderin or laminar necrosis resulting in mild intrinsic T1 signal.  Supratentorially there are to possible small areas of mild diffusion abnormality, one in the right superior parietal lobe on series 5 image 24, and the second in the right superior frontal gyrus subcortical white matter on image 22.  Mild FLAIR hyperintensity associated with the latter.  No mass effect or hemorrhage associated with these sites.  No other diffusion abnormality.  Major intracranial vascular flow voids are preserved except for the distal left vertebral artery which might be nondominant.  MRA findings are below.  No ventriculomegaly. No midline shift, mass effect, or evidence of mass lesion.  No acute intracranial hemorrhage identified. Negative pituitary, cervicomedullary junction and visualized cervical spine. Visualized bone marrow signal is within normal limits.  Visualized orbit soft tissues are within normal limits.  Trace mastoid effusions.  Mild paranasal sinus mucosal thickening. Negative scalp soft tissues.  IMPRESSION: 1.  Patchy acute to subacute  right cerebellar (SCA) territory infarcts.  No mass effect or hemorrhage. 2.  Questionable acute to subacute punctate right pontine and right MCA territory infarcts. The supra tentorial foci and might represent synchronous small vessel disease.  MRA findings below.  MRA  HEAD WITHOUT CONTRAST  Technique: Angiographic images of the Circle of Willis were obtained using MRA technique without  intravenous contrast.  Findings: Antegrade flow in the distal right vertebral artery which appears to be dominant.  Little antegrade flow in the distal left vertebral artery which appears irregular.  The distal left vertebral artery functionally terminates in PICA, and there is flow gap just proximal to the left vertebrobasilar junction (series 804 image 6).  Bilateral PICA flow is evident.  Bilateral AICA origins are patent.  Mild basilar artery irregularity without basilar stenosis.  Both SCA origins are patent, there is proximal right SCA flow identified.  There is a fetal type right PCA origin with little to no right P1 flow.  The left PCA origin is within normal limits.  Bilateral PCA branches are within normal limits.  The left posterior communicating artery also is present.  Antegrade flow in both ICA siphons.  Mild ICA irregularity and no ICA stenosis.  Ophthalmic artery origins are not well delineated. Posterior communicating artery origins are within normal limits. Normal carotid termini, MCA and ACA origins.  Diminutive or absent anterior communicating artery.  Visualized ACA branches are within normal limits.  Visualized right MCA branches are within normal limits.  Mild irregularity of the left MCA M1 segment without significant stenosis.  Visualized left MCA branches are within normal limits.  IMPRESSION:  1.  Decreased flow in the distal left vertebral artery which may be stenotic and/or nondominant.  It functionally terminates in the left PICA which appears remain patent.  2.  Dominant right vertebral artery and basilar artery without stenosis.  Bilateral PICA, AICA, and SCA origins and proximal segments appear patent.  3.  Fetal type right PCA origin.  Remaining posterior circulation and visualized anterior circulation otherwise within normal limits.  Study discussed by telephone  with Stroke team provider Annie Main on 04/05/2012 at 1230 hours.   Original Report Authenticated By: Erskine Speed, M.D.    Mr Brain Wo Contrast  04/05/2012  *RADIOLOGY REPORT*  Clinical Data:  67 year old female with left side weakness, nausea.  Comparison: Head CT without contrast 04/03/2012.  MRI HEAD WITHOUT CONTRAST  Technique: Multiplanar, multiecho pulse sequences of the brain and surrounding structures were obtained according to standard protocol without intravenous contrast.  Findings: Patchy areas of mild to moderately restricted diffusion in the right cerebellum, primarily the superior cerebellar artery territory (series 5 images six and seven).  Associated T2 and subtle FLAIR hyperintensity.  No associated mass effect or evidence of acute hemorrhage.  Superimposed chronic left PICA infarct.  Questionable punctate area of restricted diffusion in the upper right pons (series 5 image 10).  No other associated signal abnormality.  The brainstem is otherwise within normal limits.  There is a small focus of encephalomalacia at the left lateral temporal lobe with either chronic hemosiderin or laminar necrosis resulting in mild intrinsic T1 signal.  Supratentorially there are to possible small areas of mild diffusion abnormality, one in the right superior parietal lobe on series 5 image 24, and the second in the right superior frontal gyrus subcortical white matter on image 22.  Mild FLAIR hyperintensity associated with the latter.  No mass effect or hemorrhage associated with these sites.  No other  diffusion abnormality.  Major intracranial vascular flow voids are preserved except for the distal left vertebral artery which might be nondominant.  MRA findings are below.  No ventriculomegaly. No midline shift, mass effect, or evidence of mass lesion.  No acute intracranial hemorrhage identified. Negative pituitary, cervicomedullary junction and visualized cervical spine. Visualized bone marrow signal is within  normal limits.  Visualized orbit soft tissues are within normal limits.  Trace mastoid effusions.  Mild paranasal sinus mucosal thickening. Negative scalp soft tissues.  IMPRESSION: 1.  Patchy acute to subacute right cerebellar (SCA) territory infarcts.  No mass effect or hemorrhage. 2.  Questionable acute to subacute punctate right pontine and right MCA territory infarcts. The supra tentorial foci and might represent synchronous small vessel disease.  MRA findings below.  MRA HEAD WITHOUT CONTRAST  Technique: Angiographic images of the Circle of Willis were obtained using MRA technique without  intravenous contrast.  Findings: Antegrade flow in the distal right vertebral artery which appears to be dominant.  Little antegrade flow in the distal left vertebral artery which appears irregular.  The distal left vertebral artery functionally terminates in PICA, and there is flow gap just proximal to the left vertebrobasilar junction (series 804 image 6).  Bilateral PICA flow is evident.  Bilateral AICA origins are patent.  Mild basilar artery irregularity without basilar stenosis.  Both SCA origins are patent, there is proximal right SCA flow identified.  There is a fetal type right PCA origin with little to no right P1 flow.  The left PCA origin is within normal limits.  Bilateral PCA branches are within normal limits.  The left posterior communicating artery also is present.  Antegrade flow in both ICA siphons.  Mild ICA irregularity and no ICA stenosis.  Ophthalmic artery origins are not well delineated. Posterior communicating artery origins are within normal limits. Normal carotid termini, MCA and ACA origins.  Diminutive or absent anterior communicating artery.  Visualized ACA branches are within normal limits.  Visualized right MCA branches are within normal limits.  Mild irregularity of the left MCA M1 segment without significant stenosis.  Visualized left MCA branches are within normal limits.  IMPRESSION:  1.   Decreased flow in the distal left vertebral artery which may be stenotic and/or nondominant.  It functionally terminates in the left PICA which appears remain patent.  2.  Dominant right vertebral artery and basilar artery without stenosis.  Bilateral PICA, AICA, and SCA origins and proximal segments appear patent.  3.  Fetal type right PCA origin.  Remaining posterior circulation and visualized anterior circulation otherwise within normal limits.  Study discussed by telephone with Stroke team provider Annie Main on 04/05/2012 at 1230 hours.   Original Report Authenticated By: Erskine Speed, M.D.     Cardiac Studies: Tele - atrial fib with a RVR/CVR Assessment/Plan:  1. Atrial fib with an RVR - she will continue amio and metoprolol.Will watch ventricular rate closely. Will continue Pradaxa. 2. NSTEMI - no obstructive CAD by cath. I suspect she embolized to coronary artery as she did to the brain.  No additional evaluation. Continue medical therapy.  LOS: 13 days    Linard Daft,M.D. 04/07/2012, 7:51 AM

## 2012-04-08 DIAGNOSIS — I214 Non-ST elevation (NSTEMI) myocardial infarction: Secondary | ICD-10-CM

## 2012-04-08 DIAGNOSIS — I4891 Unspecified atrial fibrillation: Secondary | ICD-10-CM

## 2012-04-08 LAB — BASIC METABOLIC PANEL
CO2: 31 mEq/L (ref 19–32)
Calcium: 8.8 mg/dL (ref 8.4–10.5)
Creatinine, Ser: 1.31 mg/dL — ABNORMAL HIGH (ref 0.50–1.10)
GFR calc non Af Amer: 41 mL/min — ABNORMAL LOW (ref >90)
Sodium: 136 mEq/L (ref 135–145)

## 2012-04-08 LAB — CBC
MCH: 21.4 pg — ABNORMAL LOW (ref 26.0–34.0)
MCHC: 29.2 g/dL — ABNORMAL LOW (ref 30.0–36.0)
MCV: 73.2 fL — ABNORMAL LOW (ref 78.0–100.0)
Platelets: 127 10*3/uL — ABNORMAL LOW (ref 150–400)
RBC: 3.84 MIL/uL — ABNORMAL LOW (ref 3.87–5.11)

## 2012-04-08 LAB — OCCULT BLOOD X 1 CARD TO LAB, STOOL: Fecal Occult Bld: NEGATIVE

## 2012-04-08 LAB — GLUCOSE, CAPILLARY
Glucose-Capillary: 111 mg/dL — ABNORMAL HIGH (ref 70–99)
Glucose-Capillary: 119 mg/dL — ABNORMAL HIGH (ref 70–99)
Glucose-Capillary: 110 mg/dL — ABNORMAL HIGH (ref 70–99)

## 2012-04-08 MED ORDER — AMIODARONE HCL 200 MG PO TABS
400.0000 mg | ORAL_TABLET | Freq: Every day | ORAL | Status: DC
  Administered 2012-04-08 – 2012-04-09 (×2): 400 mg via ORAL
  Filled 2012-04-08 (×2): qty 2

## 2012-04-08 NOTE — Progress Notes (Signed)
Physical Therapy Treatment Patient Details Name: Caitlyn Hardy MRN: [id number] DOB: 06-08-1945 Today's Date: 04/08/2012 Time: 1914-7829 PT Time Calculation (min): 23 min  PT Assessment / Plan / Recommendation Comments on Treatment Session  Admitted with atrial thrombus and mid brain infarct with A-fib. HR improved with ambulation today staying low 100s. Ambulating well.     Follow Up Recommendations  SNF;Supervision for mobility/OOB     Does the patient have the potential to tolerate intense rehabilitation     Barriers to Discharge        Equipment Recommendations  None recommended by PT    Recommendations for Other Services    Frequency Min 3X/week   Plan Discharge plan remains appropriate;Frequency remains appropriate    Precautions / Restrictions Precautions Precautions: Fall Restrictions Weight Bearing Restrictions: No   Pertinent Vitals/Pain Chronic leg and back pain; complaints of GERD type feeling after taking her potassium, RN called for nausea medicine    Mobility  Bed Mobility Bed Mobility: Supine to Sit;Sit to Supine;Scooting to HOB Supine to Sit: 6: Modified independent (Device/Increase time) Sitting - Scoot to Edge of Bed: 6: Modified independent (Device/Increase time) Sit to Supine: 6: Modified independent (Device/Increase time) Scooting to [facility]: 6: Modified independent (Device/Increase time) Details for Bed Mobility Assistance: pt using bed mechanisms to aide with scooting herself to the [facility] Transfers Transfers: Sit to Stand;Stand to Sit Sit to Stand: With upper extremity assist;6: Modified independent (Device/Increase time) Stand to Sit: With upper extremity assist;6: Modified independent (Device/Increase time) Ambulation/Gait Ambulation/Gait Assistance: 5: Supervision Ambulation Distance (Feet): 120 Feet Assistive device: Rolling walker Ambulation/Gait Assistance Details: cues for tall posture and safe positioning with turns Gait Pattern: Step-through  pattern;Trunk flexed;Shuffle Gait velocity: decreased    Exercises General Exercises - Lower Extremity Long Arc Quad: AROM;Both;10 reps;Seated Toe Raises: AROM;Both;5 reps;Seated Heel Raises: AROM;Both;5 reps;Seated Shoulder Exercises Shoulder Flexion: AROM;Both;10 reps;Seated   PT Diagnosis:    PT Problem List:   PT Treatment Interventions:     PT Goals Acute Rehab PT Goals PT Goal: Supine/Side to Sit - Progress: Met PT Goal: Sit to Supine/Side - Progress: Met PT Goal: Sit to Stand - Progress: Met PT Goal: Stand to Sit - Progress: Met PT Transfer Goal: Bed to Chair/Chair to Bed - Progress: Progressing toward goal PT Goal: Ambulate - Progress: Progressing toward goal  Visit Information  Last PT Received On: 04/08/12 Assistance Needed: +1    Subjective Data  Subjective: Im still nauseous.    Cognition  Overall Cognitive Status: Appears within functional limits for tasks assessed/performed Arousal/Alertness: Awake/alert Orientation Level: Appears intact for tasks assessed Behavior During Session: [facility] for tasks performed    Balance     End of Session PT - End of Session Equipment Utilized During Treatment: Gait belt Activity Tolerance: Patient tolerated treatment well;Treatment limited secondary to medical complications (Comment) (pt nauseous) Patient left: in bed;with call bell/phone within reach Nurse Communication: Mobility status   GP     [facility] HELEN 04/08/2012, 4:43 PM

## 2012-04-08 NOTE — Clinical Social Work Placement (Addendum)
    Clinical Social Work Department CLINICAL SOCIAL WORK PLACEMENT NOTE 04/08/2012  Patient:  Caitlyn Hardy, Caitlyn Hardy  Account Number:  [id number] Admit date:  03/25/2012  Clinical Social Worker:  Hulan Fray  Date/time:  04/08/2012 04:27 PM  Clinical Social Work is seeking post-discharge placement for this patient at the following level of care:   SKILLED NURSING   (*CSW will update this form in Epic as items are completed)   04/08/2012  Patient/family provided with [facility] Department of Clinical Social Work's list of facilities offering this level of care within the geographic area requested by the patient (or if unable, by the patient's family).  04/08/2012  Patient/family informed of their freedom to choose among providers that offer the needed level of care, that participate in Medicare, Medicaid or managed care program needed by the patient, have an available bed and are willing to accept the patient.  04/08/2012  Patient/family informed of [facility]' ownership interest in [facility], as well as of the fact that they are under no obligation to receive care at this facility.  PASARR submitted to EDS on  PASARR number received from EDS on   FL2 transmitted to all facilities in geographic area requested by pt/family on  04/08/2012 FL2 transmitted to all facilities within larger geographic area on   Patient informed that his/her managed care company has contracts with or will negotiate with  certain facilities, including the following:     Patient/family informed of bed offers received:  04/09/12 Patient chooses bed at [facility] Physician recommends and patient chooses bed at    Patient to be transferred to  on   Patient to be transferred to facility by   The following physician request were entered in Epic:   Additional Comments: Patient is agreeable for CSW to initiate search in [place], [place]

## 2012-04-08 NOTE — Clinical Social Work Psychosocial (Signed)
     Clinical Social Work Department BRIEF PSYCHOSOCIAL ASSESSMENT 04/08/2012  Patient:  Caitlyn Hardy, Caitlyn Hardy     Account Number:  [id number]     Admit date:  03/25/2012  Clinical Social Worker:  Hulan Fray  Date/Time:  04/08/2012 03:48 PM  Referred by:  Care Management  Date Referred:  04/05/2012 Referred for  SNF Placement   Other Referral:   Interview type:  Patient Other interview type:    PSYCHOSOCIAL DATA Living Status:  ALONE Admitted from facility:   Level of care:   Primary support name:  Kathlene November Leard Primary support relationship to patient:  CHILD, ADULT Degree of support available:   supportive    CURRENT CONCERNS Current Concerns  Post-Acute Placement   Other Concerns:    SOCIAL WORK ASSESSMENT / PLAN Clinical Social Worker received referral for SNF placement for patient. CSW introduced self and explained reason for visit. Patient reported that she is agreeable to SNF placement near [place], [place]. Patient reported that she lives alone. CSW provided patient with SNF packet. CSW will initiate SNF search and update patient when bed offers are made. CSW will complete FL2 for MD's signature and continue to follow.   Assessment/plan status:  Psychosocial Support/Ongoing Assessment of Needs Other assessment/ plan:   Information/referral to community resources:   SNF packet    PATIENTS/FAMILYS RESPONSE TO PLAN OF CARE: Patient is agreeable to SNF placement in [place], [place]. Patient was appreciative of CSW's visit and assistance with placement.

## 2012-04-08 NOTE — Progress Notes (Signed)
PT Cancellation Note  Patient Details Name: Giselle Brutus MRN: [id number] DOB: 11-09-1945   Cancelled Treatment:    Reason Eval/Treat Not Completed: Medical issues which prohibited therapy. Pt nauseated. RN notified and to provide zofran. Will check back later if time allows.    [facility] HELEN 04/08/2012, 10:00 AM Pager: [phone]

## 2012-04-08 NOTE — Progress Notes (Signed)
Patient ID: Caitlyn Hardy, female   DOB: 1945-12-07, 67 y.o.   MRN: [id number] Subjective:  "I feel better"  Objective:  Vital Signs in the last 24 hours: Temp:  [95.9 F (35.5 C)-98.6 F (37 C)] 98 F (36.7 C) (01/09 0400) Pulse Rate:  [88-99] 91  (01/09 0600) BP: (99-122)/(35-71) 99/56 mmHg (01/09 0559) SpO2:  [93 %-100 %] 94 % (01/09 0600) Weight:  [295 lb 10.2 oz (134.1 kg)] 295 lb 10.2 oz (134.1 kg) (01/09 0432)  Intake/Output from previous day: 01/08 0701 - 01/09 0700 In: 1440.8 [P.O.:800; I.V.:640.8] Out: 1450 [Urine:1450] Intake/Output from this shift:    Physical Exam: Chronically ill appearing NAD HEENT: Unremarkable Neck:  No JVD, no thyromegally Lungs:  Clear with no wheezes, rales, or rhonchi HEART:  Regular rate rhythm, no murmurs, no rubs, no clicks Abd:  Flat, positive bowel sounds, no organomegally, no rebound, no guarding Ext:  2 plus pulses, no edema, no cyanosis, no clubbing Skin:  No rashes no nodules Neuro:  CN II through XII intact, motor grossly intact  Lab Results:  Basename 04/08/12 0412 04/07/12 0500  WBC 4.6 4.8  HGB 8.2* 7.9*  PLT 127* 108*    Basename 04/08/12 0412 04/07/12 0500  NA 136 139  K 3.8 4.1  CL 98 97  CO2 31 32  GLUCOSE 170* 161*  BUN 21 19  CREATININE 1.31* 1.33*   No results found for this basename: TROPONINI:2,CK,MB:2 in the last 72 hours Hepatic Function Panel No results found for this basename: PROT,ALBUMIN,AST,ALT,ALKPHOS,BILITOT,BILIDIR,IBILI in the last 72 hours  Basename 04/06/12 0443  CHOL 88   No results found for this basename: PROTIME in the last 72 hours  Imaging: No results found.  Cardiac Studies: Tele - atrial fib with a CVR/RVR Assessment/Plan:  1. Atrial fib with a rvr/cvr - she is improved. Will switch from iv to po amio.  2. NSTEMI - no anginal symptoms. No obst. CAD by cath. Likely embolic. 3. Stroke - minimal deficit at this point. She has been started on Pradaxa. Rec: will advance  activity today. Will switch amio to po.  LOS: 14 days    Gregg Taylor,M.D. 04/08/2012, 7:49 AM

## 2012-04-09 LAB — GLUCOSE, CAPILLARY: Glucose-Capillary: 112 mg/dL — ABNORMAL HIGH (ref 70–99)

## 2012-04-09 LAB — CBC
HCT: 31.6 % — ABNORMAL LOW (ref 36.0–46.0)
MCH: 21.5 pg — ABNORMAL LOW (ref 26.0–34.0)
MCV: 73 fL — ABNORMAL LOW (ref 78.0–100.0)
RBC: 4.33 MIL/uL (ref 3.87–5.11)
RDW: 20.4 % — ABNORMAL HIGH (ref 11.5–15.5)
WBC: 6 10*3/uL (ref 4.0–10.5)

## 2012-04-09 LAB — BASIC METABOLIC PANEL
BUN: 23 mg/dL (ref 6–23)
CO2: 31 mEq/L (ref 19–32)
Calcium: 9.3 mg/dL (ref 8.4–10.5)
Chloride: 97 mEq/L (ref 96–112)
Creatinine, Ser: 1.48 mg/dL — ABNORMAL HIGH (ref 0.50–1.10)

## 2012-04-09 MED ORDER — AMIODARONE HCL 200 MG PO TABS
400.0000 mg | ORAL_TABLET | Freq: Every day | ORAL | Status: DC
  Administered 2012-04-10: 400 mg via ORAL
  Filled 2012-04-09 (×3): qty 2

## 2012-04-09 MED ORDER — PROMETHAZINE HCL 25 MG/ML IJ SOLN
6.2500 mg | Freq: Four times a day (QID) | INTRAMUSCULAR | Status: DC | PRN
  Administered 2012-04-09: 13:00:00 via INTRAVENOUS
  Administered 2012-04-10 – 2012-04-13 (×3): 6.25 mg via INTRAVENOUS
  Filled 2012-04-09 (×4): qty 1

## 2012-04-09 NOTE — Progress Notes (Signed)
Called by RN re: nausea  Pt has had problems with nausea, she feels it's from Kdur but amio also new. Zofran not relieving symptoms. Checked labs, K+ increasing, 4.4 today, will d/c K+ and reassess in am. Probably needs supplement, but not that much. Will change amio to with breakfast. Add Phenergan to PRN meds for symptoms not controlled by Zofran. Further changes per MD.

## 2012-04-09 NOTE — Clinical Social Work Note (Signed)
CSW spoke with patient's son and they confirmed [facility] in [place], [place]. CSW will continue to follow.   Rozetta Nunnery MSW, [employer]

## 2012-04-09 NOTE — Progress Notes (Signed)
Patient ID: Caitlyn Hardy, female   DOB: 1945/07/25, 67 y.o.   MRN: [id number] Subjective:  No chest pain or sob. Walked around the floor without difficulty.c/o nausea.  Objective:  Vital Signs in the last 24 hours: Temp:  [97.1 F (36.2 C)-98.4 F (36.9 C)] 97.1 F (36.2 C) (01/10 1129) Pulse Rate:  [103-104] 103  (01/10 0558) Resp:  [18-19] 18  (01/10 1129) BP: (97-122)/(52-86) 122/86 mmHg (01/10 1130) SpO2:  [93 %-98 %] 95 % (01/10 1129) Weight:  [290 lb 12.6 oz (131.9 kg)] 290 lb 12.6 oz (131.9 kg) (01/10 0356)  Intake/Output from previous day: 01/09 0701 - 01/10 0700 In: 295.4 [P.O.:240; I.V.:53.4; IV Piggyback:2] Out: -  Intake/Output from this shift:    Physical Exam: Well appearing NAD HEENT: Unremarkable Neck:  No JVD, no thyromegally Lungs:  Clear with no wheezes HEART:  iRegular rate rhythm, no murmurs, no rubs, no clicks Abd:  soft, positive bowel sounds, no organomegally, no rebound, no guarding Ext:  2 plus pulses, no edema, no cyanosis, no clubbing Skin:  No rashes no nodules Neuro:  CN II through XII intact, motor grossly intact  Lab Results:  Basename 04/09/12 0445 04/08/12 0412  WBC 6.0 4.6  HGB 9.3* 8.2*  PLT 182 127*    Basename 04/09/12 0445 04/08/12 0412  NA 138 136  K 4.4 3.8  CL 97 98  CO2 31 31  GLUCOSE 127* 170*  BUN 23 21  CREATININE 1.48* 1.31*   No results found for this basename: TROPONINI:2,CK,MB:2 in the last 72 hours Hepatic Function Panel No results found for this basename: PROT,ALBUMIN,AST,ALT,ALKPHOS,BILITOT,BILIDIR,IBILI in the last 72 hours No results found for this basename: CHOL in the last 72 hours No results found for this basename: PROTIME in the last 72 hours  Imaging: No results found.  Cardiac Studies: Tele - atrial fib with a cvr/rvr. Assessment/Plan:  1. Atrial fibrillation with a cvr/rvr now on oral amio, continue beta blocker. She may require digoxin for rate control. 2. Nausea -patient notes that nausea  predated her amiodarone. Will follow 3. NSTEMI - presumed due to emboli. No additional workup.  LOS: 15 days    Daved Mcfann,M.D. 04/09/2012, 6:01 PM

## 2012-04-10 LAB — CBC
HCT: 31.4 % — ABNORMAL LOW (ref 36.0–46.0)
Hemoglobin: 9 g/dL — ABNORMAL LOW (ref 12.0–15.0)
RBC: 4.29 MIL/uL (ref 3.87–5.11)
WBC: 5 10*3/uL (ref 4.0–10.5)

## 2012-04-10 LAB — GLUCOSE, CAPILLARY
Glucose-Capillary: 115 mg/dL — ABNORMAL HIGH (ref 70–99)
Glucose-Capillary: 119 mg/dL — ABNORMAL HIGH (ref 70–99)
Glucose-Capillary: 129 mg/dL — ABNORMAL HIGH (ref 70–99)

## 2012-04-10 LAB — BASIC METABOLIC PANEL
CO2: 31 mEq/L (ref 19–32)
Chloride: 97 mEq/L (ref 96–112)
GFR calc non Af Amer: 29 mL/min — ABNORMAL LOW (ref >90)
Glucose, Bld: 121 mg/dL — ABNORMAL HIGH (ref 70–99)
Potassium: 4.2 mEq/L (ref 3.5–5.1)
Sodium: 140 mEq/L (ref 135–145)

## 2012-04-10 MED ORDER — METOPROLOL SUCCINATE ER 100 MG PO TB24
100.0000 mg | ORAL_TABLET | Freq: Two times a day (BID) | ORAL | Status: DC
  Administered 2012-04-10 (×2): 100 mg via ORAL
  Filled 2012-04-10 (×4): qty 1

## 2012-04-10 MED ORDER — DIGOXIN 125 MCG PO TABS
0.1250 mg | ORAL_TABLET | Freq: Every day | ORAL | Status: DC
  Administered 2012-04-10 – 2012-04-14 (×5): 0.125 mg via ORAL
  Filled 2012-04-10 (×7): qty 1

## 2012-04-10 NOTE — Progress Notes (Addendum)
Patient ID: Caitlyn Hardy, female   DOB: 1945/11/11, 67 y.o.   MRN: [id number]    SUBJECTIVE: Dyspneic with walking, doing fine at rest.  HR still running high, in the 120s currently.  No chest pain.       Marland Kitchen amiodarone  400 mg Oral Daily  . atorvastatin  10 mg Oral q1800  . budesonide  0.5 mg Nebulization BID  . dabigatran  150 mg Oral Q12H  . digoxin  0.125 mg Oral Daily  . furosemide  80 mg Oral BID  . insulin aspart  0-15 Units Subcutaneous TID WC  . metoprolol succinate  100 mg Oral BID  . montelukast  10 mg Oral QHS  . polyethylene glycol  17 g Oral Daily  . rOPINIRole  2 mg Oral QHS  . traZODone  150 mg Oral QHS      Filed Vitals:   04/09/12 2355 04/10/12 0341 04/10/12 0500 04/10/12 0756  BP: 94/73 122/80    Pulse:      Temp: 96.9 F (36.1 C) 98.1 F (36.7 C)  97.4 F (36.3 C)  TempSrc: Oral Oral  Oral  Resp:    18  Height:      Weight:   288 lb 12.8 oz (131 kg)   SpO2: 94% 96%  92%    Intake/Output Summary (Last 24 hours) at 04/10/12 0946 Last data filed at 04/10/12 0600  Gross per 24 hour  Intake    304 ml  Output      0 ml  Net    304 ml    LABS: Basic Metabolic Panel:  Basename 04/10/12 0513 04/09/12 0445  NA 140 138  K 4.2 4.4  CL 97 97  CO2 31 31  GLUCOSE 121* 127*  BUN 27* 23  CREATININE 1.76* 1.48*  CALCIUM 9.2 9.3  MG -- --  PHOS -- --   Liver Function Tests: No results found for this basename: AST:2,ALT:2,ALKPHOS:2,BILITOT:2,PROT:2,ALBUMIN:2 in the last 72 hours No results found for this basename: LIPASE:2,AMYLASE:2 in the last 72 hours CBC:  Basename 04/10/12 0513 04/09/12 0445  WBC 5.0 6.0  NEUTROABS -- --  HGB 9.0* 9.3*  HCT 31.4* 31.6*  MCV 73.2* 73.0*  PLT 220 182   Cardiac Enzymes: No results found for this basename: CKTOTAL:3,CKMB:3,CKMBINDEX:3,TROPONINI:3 in the last 72 hours BNP: No components found with this basename: POCBNP:3 D-Dimer: No results found for this basename: DDIMER:2 in the last 72 hours Hemoglobin  A1C: No results found for this basename: HGBA1C in the last 72 hours Fasting Lipid Panel: No results found for this basename: CHOL,HDL,LDLCALC,TRIG,CHOLHDL,LDLDIRECT in the last 72 hours Thyroid Function Tests: No results found for this basename: TSH,T4TOTAL,FREET3,T3FREE,THYROIDAB in the last 72 hours Anemia Panel: No results found for this basename: VITAMINB12,FOLATE,FERRITIN,TIBC,IRON,RETICCTPCT in the last 72 hours  PHYSICAL EXAM General: NAD Neck: JVP 12 cm, no thyromegaly or thyroid nodule.  Lungs: Crackles at bases bilaterally CV: Nondisplaced PMI.  Heart mildly tachycardic, irregular S1/S2, no S3/S4, 1/6 HSM LLSB.  1+ ankle edema.   Abdomen: Soft, nontender, no hepatosplenomegaly, no distention.  Neurologic: Alert and oriented x 3.  Psych: Normal affect. Extremities: No clubbing or cyanosis.   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  1. CHF: Acute on chronic systolic.  She still appears volume overloaded with elevated JVP.  She is now on po Lasix.  Creatinine has risen to 1.76.  Weight is down 2 lbs from yesterday.  - Continue po Lasix at current dose for now but will need to follow creatinine closely.  - I am going to add digoxin to help with rate control.  May eventually need to be dosed every other day if creatinine does not improve.  - Continue to follow strict I/Os.  - Hold off on ACEI initiation for now with elevated creatinine.  2. CVA: Suspect this was cardioembolic given atrial fibrillation.  She is on Pradaxa.  3. Atrial fibrillation:  She was not cardioverted because LAA appendage could not be adequately visualized on TEE.  Could cardiovert after a month of therapeutic anticoagulation.  HR is still rapid, around 120 this morning.  Will  consolidate metoprolol to Toprol XL and will add digoxin this morning.  If creatinine rises any further, will need to decrease Pradaxa to 75 mg bid (GFR currently right around 30).  4. CKD: Creatinine rising.  If it goes up again, may need to hold Lasix for a period of time.  5. CAD: Grafts patent on cath this hospitalization.  Suspect NSTEMI due to demand ischemia.   [name] 04/10/2012 9:53 AM

## 2012-04-11 LAB — PRO B NATRIURETIC PEPTIDE: Pro B Natriuretic peptide (BNP): 18177 pg/mL — ABNORMAL HIGH (ref 0–125)

## 2012-04-11 LAB — BASIC METABOLIC PANEL
BUN: 32 mg/dL — ABNORMAL HIGH (ref 6–23)
CO2: 30 mEq/L (ref 19–32)
Calcium: 9.2 mg/dL (ref 8.4–10.5)
Chloride: 95 mEq/L — ABNORMAL LOW (ref 96–112)
Creatinine, Ser: 2.04 mg/dL — ABNORMAL HIGH (ref 0.50–1.10)
GFR calc non Af Amer: 24 mL/min — ABNORMAL LOW (ref >90)
Glucose, Bld: 114 mg/dL — ABNORMAL HIGH (ref 70–99)
Sodium: 137 mEq/L (ref 135–145)

## 2012-04-11 LAB — CBC
Hemoglobin: 9.1 g/dL — ABNORMAL LOW (ref 12.0–15.0)
MCH: 21.4 pg — ABNORMAL LOW (ref 26.0–34.0)
MCHC: 29.1 g/dL — ABNORMAL LOW (ref 30.0–36.0)
RDW: 20.7 % — ABNORMAL HIGH (ref 11.5–15.5)

## 2012-04-11 LAB — GLUCOSE, CAPILLARY
Glucose-Capillary: 115 mg/dL — ABNORMAL HIGH (ref 70–99)
Glucose-Capillary: 97 mg/dL (ref 70–99)

## 2012-04-11 MED ORDER — ALTEPLASE 100 MG IV SOLR
2.0000 mg | Freq: Once | INTRAVENOUS | Status: AC
  Administered 2012-04-11: 2 mg
  Filled 2012-04-11: qty 2

## 2012-04-11 MED ORDER — AMIODARONE LOAD VIA INFUSION
150.0000 mg | Freq: Once | INTRAVENOUS | Status: DC
  Administered 2012-04-11: 150 mg via INTRAVENOUS
  Filled 2012-04-11: qty 83.34

## 2012-04-11 MED ORDER — METOPROLOL SUCCINATE ER 50 MG PO TB24
50.0000 mg | ORAL_TABLET | Freq: Two times a day (BID) | ORAL | Status: DC
  Administered 2012-04-11: 50 mg via ORAL
  Filled 2012-04-11 (×3): qty 1

## 2012-04-11 MED ORDER — AMIODARONE HCL IN DEXTROSE 360-4.14 MG/200ML-% IV SOLN
30.0000 mg/h | INTRAVENOUS | Status: DC
  Administered 2012-04-12 – 2012-04-14 (×6): 30 mg/h via INTRAVENOUS
  Filled 2012-04-11 (×16): qty 200

## 2012-04-11 MED ORDER — MILRINONE IN DEXTROSE 20 MG/100ML IV SOLN
0.1250 ug/kg/min | INTRAVENOUS | Status: DC
  Administered 2012-04-11 – 2012-04-13 (×6): 0.25 ug/kg/min via INTRAVENOUS
  Filled 2012-04-11 (×5): qty 100

## 2012-04-11 MED ORDER — AMIODARONE HCL IN DEXTROSE 360-4.14 MG/200ML-% IV SOLN
60.0000 mg/h | INTRAVENOUS | Status: AC
  Administered 2012-04-11: 60 mg/h via INTRAVENOUS
  Administered 2012-04-11: 30 mg/h via INTRAVENOUS
  Filled 2012-04-11 (×2): qty 200

## 2012-04-11 MED ORDER — FUROSEMIDE 10 MG/ML IJ SOLN
80.0000 mg | Freq: Two times a day (BID) | INTRAMUSCULAR | Status: DC
  Administered 2012-04-11 (×2): 80 mg via INTRAVENOUS
  Filled 2012-04-11 (×4): qty 8

## 2012-04-11 MED ORDER — DABIGATRAN ETEXILATE MESYLATE 75 MG PO CAPS
75.0000 mg | ORAL_CAPSULE | Freq: Two times a day (BID) | ORAL | Status: DC
  Administered 2012-04-11 – 2012-04-19 (×17): 75 mg via ORAL
  Filled 2012-04-11 (×18): qty 1

## 2012-04-11 NOTE — Progress Notes (Addendum)
Patient ID: Caitlyn Hardy, female   DOB: 1945/12/18, 67 y.o.   MRN: [id number]  SUBJECTIVE: Dyspneic with walking, doing fine at rest.  HR in 100s this morning but has not had am meds.  Creatinine rising.  Weight down 3 lbs compared to yesterday.  No I/Os.     Marland Kitchen amiodarone  400 mg Oral Daily  . atorvastatin  10 mg Oral q1800  . budesonide  0.5 mg Nebulization BID  . dabigatran  75 mg Oral Q12H  . digoxin  0.125 mg Oral Daily  . insulin aspart  0-15 Units Subcutaneous TID WC  . metoprolol succinate  100 mg Oral BID  . montelukast  10 mg Oral QHS  . polyethylene glycol  17 g Oral Daily  . rOPINIRole  2 mg Oral QHS  . traZODone  150 mg Oral QHS      Filed Vitals:   04/11/12 0026 04/11/12 0331 04/11/12 0444 04/11/12 0807  BP: 133/87 107/78    Pulse:      Temp: 97.5 F (36.4 C) 97.1 F (36.2 C)  97.4 F (36.3 C)  TempSrc: Oral Oral  Oral  Resp: 20 20  20   Height:      Weight:   285 lb 4.4 oz (129.4 kg)   SpO2: 96% 98%  94%   No intake or output data in the 24 hours ending 04/11/12 0940  LABS: Basic Metabolic Panel:  Basename 04/11/12 0414 04/10/12 1905 04/10/12 0513  NA 137 -- 140  K 3.6 -- 4.2  CL 93* -- 97  CO2 32 -- 31  GLUCOSE 114* -- 121*  BUN 31* -- 27*  CREATININE 2.09* -- 1.76*  CALCIUM 9.2 -- 9.2  MG -- 2.2 --  PHOS -- -- --   Liver Function Tests: No results found for this basename: AST:2,ALT:2,ALKPHOS:2,BILITOT:2,PROT:2,ALBUMIN:2 in the last 72 hours No results found for this basename: LIPASE:2,AMYLASE:2 in the last 72 hours CBC:  Basename 04/11/12 0414 04/10/12 0513  WBC 4.8 5.0  NEUTROABS -- --  HGB 9.1* 9.0*  HCT 31.3* 31.4*  MCV 73.5* 73.2*  PLT 267 220   Cardiac Enzymes: No results found for this basename: CKTOTAL:3,CKMB:3,CKMBINDEX:3,TROPONINI:3 in the last 72 hours BNP: No components found with this basename: POCBNP:3 D-Dimer: No results found for this basename: DDIMER:2 in the last 72 hours Hemoglobin A1C: No results found for this  basename: HGBA1C in the last 72 hours Fasting Lipid Panel: No results found for this basename: CHOL,HDL,LDLCALC,TRIG,CHOLHDL,LDLDIRECT in the last 72 hours Thyroid Function Tests: No results found for this basename: TSH,T4TOTAL,FREET3,T3FREE,THYROIDAB in the last 72 hours Anemia Panel: No results found for this basename: VITAMINB12,FOLATE,FERRITIN,TIBC,IRON,RETICCTPCT in the last 72 hours  PHYSICAL EXAM General: NAD Neck: JVP 14 cm, no thyromegaly or thyroid nodule.  Lungs: Crackles at bases bilaterally CV: Nondisplaced PMI.  Heart mildly tachycardic, irregular S1/S2, no S3/S4, 1/6 HSM LLSB.  1+ ankle edema.   Abdomen: Soft, nontender, no hepatosplenomegaly, no distention.  Neurologic: Alert and oriented x 3.  Psych: Normal affect. Extremities: No clubbing or cyanosis.   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  1. CHF: Acute on chronic systolic.  She still appears significantly volume overloaded with elevated JVP.  She is now on po Lasix.  Creatinine has risen to 2.  Weight is down 3 lbs from  yesterday. She has actually lost considerable weight during this hospitalization.  Difficult situation as she still appears quite volume overloaded but diuresis is going to be limited by renal dysfunction and inotrope use is going to be limited by atrial fibrillation.   - Hold po Lasix for right now.  - Will get CVP => if marked elevated CVP would consider starting low dose milrinone + amiodarone gtt to keep HR down and restarting IV Lasix.   - Continue to follow strict I/Os.  - Hold off on ACEI initiation for now with elevated creatinine.  2. CVA: Suspect this was cardioembolic given atrial fibrillation.  She is on Pradaxa.  3. Atrial fibrillation:  She was not cardioverted  because LAA appendage could not be adequately visualized on TEE.  Could cardiovert after a month of therapeutic anticoagulation.  She is currently on Toprol XL and digoxin as well as po amiodarone.  HR still high but better than yesterday.  Creatinine up again today, will need to decrease Pradaxa to 75 bid.  4. CKD: Creatinine rising, supect cardiorenal.  Holding Lasix this morning.  Getting CVP this am, may consider starting milrinone + amiodarone gtt to keep HR down.  5. CAD: Grafts patent on cath this hospitalization.  Suspect NSTEMI due to demand ischemia.   [name] 04/11/2012 9:40 AM  CVP 16-17 range and still short of breath.  - Give amiodarone IV bolus and start gtt - Milrinone 0.25 mcg/kg/min - Lasix 80 mg IV bid, 1st dose after milrinone is begun.  - Decrease Toprol XL to 50 mg bid.   Will have to follow HR.   [name] 04/11/2012 10:28 AM

## 2012-04-12 LAB — CARBOXYHEMOGLOBIN
Methemoglobin: 1.2 % (ref 0.0–1.5)
O2 Saturation: 62.9 %

## 2012-04-12 LAB — CBC
Hemoglobin: 8 g/dL — ABNORMAL LOW (ref 12.0–15.0)
MCH: 21.2 pg — ABNORMAL LOW (ref 26.0–34.0)
MCV: 72.5 fL — ABNORMAL LOW (ref 78.0–100.0)
RBC: 3.78 MIL/uL — ABNORMAL LOW (ref 3.87–5.11)
WBC: 5 10*3/uL (ref 4.0–10.5)

## 2012-04-12 LAB — BASIC METABOLIC PANEL
BUN: 28 mg/dL — ABNORMAL HIGH (ref 6–23)
CO2: 32 mEq/L (ref 19–32)
Calcium: 8.4 mg/dL (ref 8.4–10.5)
Calcium: 9 mg/dL (ref 8.4–10.5)
Chloride: 93 mEq/L — ABNORMAL LOW (ref 96–112)
Creatinine, Ser: 1.88 mg/dL — ABNORMAL HIGH (ref 0.50–1.10)
GFR calc Af Amer: 33 mL/min — ABNORMAL LOW (ref >90)
GFR calc non Af Amer: 29 mL/min — ABNORMAL LOW (ref >90)
Glucose, Bld: 183 mg/dL — ABNORMAL HIGH (ref 70–99)
Potassium: 3.7 mEq/L (ref 3.5–5.1)
Sodium: 135 mEq/L (ref 135–145)
Sodium: 139 mEq/L (ref 135–145)

## 2012-04-12 LAB — GLUCOSE, CAPILLARY: Glucose-Capillary: 135 mg/dL — ABNORMAL HIGH (ref 70–99)

## 2012-04-12 MED ORDER — AMIODARONE LOAD VIA INFUSION
150.0000 mg | Freq: Once | INTRAVENOUS | Status: AC
  Administered 2012-04-12: 150 mg via INTRAVENOUS
  Filled 2012-04-12: qty 83.34

## 2012-04-12 MED ORDER — POTASSIUM CHLORIDE CRYS ER 20 MEQ PO TBCR: 40.0000 meq | EXTENDED_RELEASE_TABLET | Freq: Once | ORAL | Status: DC

## 2012-04-12 MED ORDER — POTASSIUM CHLORIDE CRYS ER 20 MEQ PO TBCR
40.0000 meq | EXTENDED_RELEASE_TABLET | Freq: Once | ORAL | Status: AC
  Administered 2012-04-12: 40 meq via ORAL
  Filled 2012-04-12: qty 2

## 2012-04-12 MED ORDER — POTASSIUM CHLORIDE CRYS ER 20 MEQ PO TBCR
40.0000 meq | EXTENDED_RELEASE_TABLET | Freq: Once | ORAL | Status: AC
  Administered 2012-04-12: 40 meq via ORAL
  Filled 2012-04-12 (×2): qty 2

## 2012-04-12 MED ORDER — FUROSEMIDE 10 MG/ML IJ SOLN
40.0000 mg | Freq: Once | INTRAMUSCULAR | Status: AC
  Administered 2012-04-12: 40 mg via INTRAVENOUS
  Filled 2012-04-12: qty 4

## 2012-04-12 MED ORDER — POTASSIUM CHLORIDE 20 MEQ PO PACK: 40.0000 meq | PACK | Freq: Once | ORAL | Status: DC

## 2012-04-12 MED ORDER — METOLAZONE 2.5 MG PO TABS
2.5000 mg | ORAL_TABLET | Freq: Once | ORAL | Status: AC
  Administered 2012-04-12: 2.5 mg via ORAL
  Filled 2012-04-12: qty 1

## 2012-04-12 MED ORDER — FUROSEMIDE 10 MG/ML IJ SOLN
4.0000 mg/h | INTRAVENOUS | Status: DC
  Administered 2012-04-12 – 2012-04-13 (×2): 10 mg/h via INTRAVENOUS
  Filled 2012-04-12 (×4): qty 25

## 2012-04-12 MED ORDER — POTASSIUM CHLORIDE CRYS ER 20 MEQ PO TBCR
40.0000 meq | EXTENDED_RELEASE_TABLET | Freq: Once | ORAL | Status: AC
  Administered 2012-04-12: 40 meq via ORAL

## 2012-04-12 MED ORDER — METOPROLOL SUCCINATE ER 50 MG PO TB24
75.0000 mg | ORAL_TABLET | Freq: Two times a day (BID) | ORAL | Status: DC
  Administered 2012-04-12 – 2012-04-13 (×4): 75 mg via ORAL
  Filled 2012-04-12 (×6): qty 1

## 2012-04-12 NOTE — Progress Notes (Signed)
Pt not diuresing as expected on lasix gtt.  Notified Dr. Shirlee Latch of pt's current output, HR, and BMP results.  Orders given

## 2012-04-12 NOTE — Progress Notes (Signed)
PT Cancellation Note  Patient Details Name: Caitlyn Hardy MRN: [id number] DOB: 1945-06-24   Cancelled Treatment:    Reason Eval/Treat Not Completed: Medical issues which prohibited therapy. K+ 2.8 without repletion.   [facility] HELEN 04/12/2012, 11:31 AM Pager: [phone]

## 2012-04-12 NOTE — Progress Notes (Signed)
[name], OTR/L Pager: [phone] 04/12/2012   

## 2012-04-12 NOTE — Progress Notes (Signed)
Patient ID: Caitlyn Hardy, female   DOB: [birth year], 67 y.o.   MRN: [id number]  SUBJECTIVE: Still dyspneic.  Started on amiodarone gtt and milrinone yesterday.  Creatinine down today.  Some diuresis but weight is stable.  CVP still high, 12-14.       Marland Kitchen atorvastatin  10 mg Oral q1800  . budesonide  0.5 mg Nebulization BID  . dabigatran  75 mg Oral Q12H  . digoxin  0.125 mg Oral Daily  . furosemide  40 mg Intravenous Once  . insulin aspart  0-15 Units Subcutaneous TID WC  . metoprolol succinate  75 mg Oral BID  . montelukast  10 mg Oral QHS  . polyethylene glycol  17 g Oral Daily  . potassium chloride  40 mEq Oral Once  . potassium chloride  40 mEq Oral Once  . potassium chloride  40 mEq Oral Once  . rOPINIRole  2 mg Oral QHS  . traZODone  150 mg Oral QHS  amiodarone gtt Milrinone gtt @ 0.25    Filed Vitals:   04/12/12 0000 04/12/12 0300 04/12/12 0414 04/12/12 0730  BP: 104/60 131/62 134/65 100/73  Pulse:    98  Temp:   96.6 F (35.9 C) 97.6 F (36.4 C)  TempSrc:   Oral Oral  Resp:    18  Height:      Weight:   285 lb 11.5 oz (129.6 kg)   SpO2:   96% 95%    Intake/Output Summary (Last 24 hours) at 04/12/12 0749 Last data filed at 04/12/12 0730  Gross per 24 hour  Intake 755.03 ml  Output   2050 ml  Net -1294.97 ml    LABS: Basic Metabolic Panel:  Basename 04/12/12 0400 04/11/12 1548 04/10/12 1905  NA 135 138 --  K 2.8* 3.5 --  CL 93* 95* --  CO2 32 30 --  GLUCOSE 183* 116* --  BUN 29* 32* --  CREATININE 1.88* 2.04* --  CALCIUM 8.4 9.0 --  MG -- -- 2.2  PHOS -- -- --   Liver Function Tests: No results found for this basename: AST:2,ALT:2,ALKPHOS:2,BILITOT:2,PROT:2,ALBUMIN:2 in the last 72 hours No results found for this basename: LIPASE:2,AMYLASE:2 in the last 72 hours CBC:  Basename 04/12/12 0400 04/11/12 0414  WBC 5.0 4.8  NEUTROABS -- --  HGB 8.0* 9.1*  HCT 27.4* 31.3*  MCV 72.5* 73.5*  PLT 240 267    PHYSICAL EXAM General: NAD Neck: JVP 14  cm, no thyromegaly or thyroid nodule.  Lungs: Crackles at bases bilaterally CV: Nondisplaced PMI.  Heart mildly tachycardic, irregular S1/S2, no S3/S4, 1/6 HSM LLSB.  Trace ankle edema.   Abdomen: Soft, nontender, no hepatosplenomegaly, no distention.  Neurologic: Alert and oriented x 3.  Psych: Normal affect. Extremities: No clubbing or cyanosis.   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  1. CHF: Acute on chronic systolic.  She still appears significantly volume overloaded with elevated JVP.  CVP 12-14.  She is now on IV milrinone with amiodarone gtt to try to keep HR down.  Some diuresis yesterday but not vigorous.  Creatinine improved with co-ox 68% this morning. - Continue milrinone gtt - Change to Lasix gtt, give 40 mg IV bolus then 10 mg/hr.  2. CVA: Suspect this was cardioembolic given atrial fibrillation.  She is on  Pradaxa.  3. Atrial fibrillation:  She was not cardioverted because LAA appendage could not be adequately visualized on TEE.  Could cardiovert after a month of therapeutic anticoagulation.  Rate control has been difficult.  She is on amiodarone gtt + Toprol XL + digoxin.  She is back on milrinone now because of need for further diuresis and rising creatinine with diuresis off milrinone.   - Increase Toprol XL to 75 mg bid.  Will tolerate HR in the 100s.  4. CKD: Creatinine better this morning after starting milrinone.  AKI is cardiorenal. Pradaxa being dosed at 75 mg bid.  5. CAD: Grafts patent on cath this hospitalization.  Suspect NSTEMI due to demand ischemia.   [name] 04/12/2012 7:49 AM

## 2012-04-13 LAB — BASIC METABOLIC PANEL
BUN: 24 mg/dL — ABNORMAL HIGH (ref 6–23)
CO2: 38 mEq/L — ABNORMAL HIGH (ref 19–32)
Calcium: 8.8 mg/dL (ref 8.4–10.5)
Chloride: 89 mEq/L — ABNORMAL LOW (ref 96–112)
Creatinine, Ser: 1.69 mg/dL — ABNORMAL HIGH (ref 0.50–1.10)
GFR calc Af Amer: 35 mL/min — ABNORMAL LOW (ref >90)
GFR calc non Af Amer: 30 mL/min — ABNORMAL LOW (ref >90)
Glucose, Bld: 216 mg/dL — ABNORMAL HIGH (ref 70–99)
Glucose, Bld: 149 mg/dL — ABNORMAL HIGH (ref 70–99)
Potassium: 3.2 mEq/L — ABNORMAL LOW (ref 3.5–5.1)
Potassium: 3.9 mEq/L (ref 3.5–5.1)
Sodium: 137 mEq/L (ref 135–145)

## 2012-04-13 LAB — MAGNESIUM: Magnesium: 2.2 mg/dL (ref 1.5–2.5)

## 2012-04-13 LAB — CBC
HCT: 29.6 % — ABNORMAL LOW (ref 36.0–46.0)
Hemoglobin: 8.8 g/dL — ABNORMAL LOW (ref 12.0–15.0)
MCH: 21.5 pg — ABNORMAL LOW (ref 26.0–34.0)
MCHC: 29.7 g/dL — ABNORMAL LOW (ref 30.0–36.0)
RDW: 20.1 % — ABNORMAL HIGH (ref 11.5–15.5)

## 2012-04-13 LAB — GLUCOSE, CAPILLARY
Glucose-Capillary: 101 mg/dL — ABNORMAL HIGH (ref 70–99)
Glucose-Capillary: 134 mg/dL — ABNORMAL HIGH (ref 70–99)
Glucose-Capillary: 107 mg/dL — ABNORMAL HIGH (ref 70–99)

## 2012-04-13 LAB — CARBOXYHEMOGLOBIN: Carboxyhemoglobin: 2 % — ABNORMAL HIGH (ref 0.5–1.5)

## 2012-04-13 MED ORDER — METOLAZONE 2.5 MG PO TABS
2.5000 mg | ORAL_TABLET | Freq: Once | ORAL | Status: AC
  Administered 2012-04-13: 2.5 mg via ORAL
  Filled 2012-04-13: qty 1

## 2012-04-13 MED ORDER — POTASSIUM CHLORIDE CRYS ER 20 MEQ PO TBCR
EXTENDED_RELEASE_TABLET | ORAL | Status: AC
  Filled 2012-04-13: qty 2

## 2012-04-13 MED ORDER — POTASSIUM CHLORIDE CRYS ER 20 MEQ PO TBCR
40.0000 meq | EXTENDED_RELEASE_TABLET | Freq: Once | ORAL | Status: AC
  Administered 2012-04-13: 40 meq via ORAL
  Filled 2012-04-13: qty 2

## 2012-04-13 MED ORDER — POTASSIUM CHLORIDE 20 MEQ PO PACK: 40.0000 meq | PACK | Freq: Once | ORAL | Status: DC

## 2012-04-13 MED ORDER — POTASSIUM CHLORIDE CRYS ER 20 MEQ PO TBCR
20.0000 meq | EXTENDED_RELEASE_TABLET | Freq: Once | ORAL | Status: AC
  Administered 2012-04-13: 20 meq via ORAL
  Filled 2012-04-13: qty 1

## 2012-04-13 MED ORDER — POTASSIUM CHLORIDE CRYS ER 20 MEQ PO TBCR
40.0000 meq | EXTENDED_RELEASE_TABLET | Freq: Once | ORAL | Status: AC
  Administered 2012-04-13: 40 meq via ORAL

## 2012-04-13 NOTE — Progress Notes (Signed)
Patient ID: Caitlyn Hardy, female   DOB: [birth year], 67 y.o.   MRN: [id number]    SUBJECTIVE: Shooting pains in left shoulder this morning.  She diuresed very well yesterday after getting metolazone.  Good co-ox this morning and weight is down 3 lbs.   CVP 8-9 cm (measured myself).     Marland Kitchen atorvastatin  10 mg Oral q1800  . budesonide  0.5 mg Nebulization BID  . dabigatran  75 mg Oral Q12H  . digoxin  0.125 mg Oral Daily  . insulin aspart  0-15 Units Subcutaneous TID WC  . metolazone  2.5 mg Oral Once  . metoprolol succinate  75 mg Oral BID  . montelukast  10 mg Oral QHS  . polyethylene glycol  17 g Oral Daily  . potassium chloride  40 mEq Oral Once  . rOPINIRole  2 mg Oral QHS  . traZODone  150 mg Oral QHS  amiodarone gtt Milrinone gtt @ 0.25 Lasix gtt @ 10    Filed Vitals:   04/13/12 0300 04/13/12 0345 04/13/12 0400 04/13/12 0500  BP: 112/90 108/50 124/62   Pulse:   65 106  Temp:  97.8 F (36.6 C)    TempSrc:  Oral    Resp:  16    Height:      Weight:    282 lb 13.6 oz (128.3 kg)  SpO2:  100% 99% 100%    Intake/Output Summary (Last 24 hours) at 04/13/12 0718 Last data filed at 04/13/12 0500  Gross per 24 hour  Intake 1012.93 ml  Output   4590 ml  Net -3577.07 ml    LABS: Basic Metabolic Panel:  Basename 04/13/12 0429 04/12/12 1400 04/10/12 1905  NA 135 139 --  K 3.2* 3.7 --  CL 89* 95* --  CO2 36* 31 --  GLUCOSE 216* 151* --  BUN 24* 28* --  CREATININE 1.69* 1.77* --  CALCIUM 8.8 9.0 --  MG -- -- 2.2  PHOS -- -- --   Liver Function Tests: No results found for this basename: AST:2,ALT:2,ALKPHOS:2,BILITOT:2,PROT:2,ALBUMIN:2 in the last 72 hours No results found for this basename: LIPASE:2,AMYLASE:2 in the last 72 hours CBC:  Basename 04/13/12 0429 04/12/12 0400  WBC 4.6 5.0  NEUTROABS -- --  HGB 8.8* 8.0*  HCT 29.6* 27.4*  MCV 72.2* 72.5*  PLT 300 240    PHYSICAL EXAM General: NAD Neck: JVP 9-10 cm, no thyromegaly or thyroid nodule.  Lungs:  Crackles at bases bilaterally CV: Nondisplaced PMI.  Heart mildly tachycardic, irregular S1/S2, no S3/S4, 1/6 HSM LLSB.  Trace ankle edema.   Abdomen: Soft, nontender, no hepatosplenomegaly, no distention.  Neurologic: Alert and oriented x 3.  Psych: Normal affect. Extremities: No clubbing or cyanosis.   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  1. CHF: Acute on chronic systolic.  She is now on IV milrinone with amiodarone gtt to try to keep HR down.  She diuresed well yesterday with Lasix gtt + 1 bolus of metolazone.  Weight is down 3 lbs.  CVP down to 8-9 and JVP lower.  I think she still has some fluid and would like 1 more good day of diuresis today.  Will continue current meds and will give a dose of metolazone around noon.  Replete K.  2. CVA: Suspect this was  cardioembolic given atrial fibrillation.  She is on Pradaxa.  3. Atrial fibrillation:  She was not cardioverted because LAA appendage could not be adequately visualized on TEE.  Could cardiovert after a month of therapeutic anticoagulation.  Rate control has been difficult.  She is on amiodarone gtt + Toprol XL + digoxin.  She is back on milrinone now because of need for further diuresis and rising creatinine with diuresis off milrinone.   - Continue current rate control meds, will accept HR in 100s while on milrinone.  4. CKD: Creatinine better this morning.  AKI is cardiorenal. Pradaxa being dosed at 75 mg bid, may be able to go back up to 150 bid if creatinine continues to improve.  5. CAD: Grafts patent on cath this hospitalization.  Suspect NSTEMI due to demand ischemia.  6. Needs PT.  Hopefully placement to SNF later this week.   [name] 04/13/2012 7:18 AM

## 2012-04-13 NOTE — Clinical Social Work Note (Signed)
Clinical Social Worker is continuing to follow to facilitated discharge to [facility] when medically stable.   Rozetta Nunnery MSW, [employer] [phone]

## 2012-04-13 NOTE — Progress Notes (Signed)
Notified Md about pts increase of VT.  Vs stable. Pt asymptomatic.  New orders received. Will continue to monitor.Caitlyn Hardy

## 2012-04-13 NOTE — Progress Notes (Signed)
Occupational Therapy Treatment Patient Details Name: Caitlyn Hardy MRN: [id number] DOB: 1946/02/21 Today's Date: 04/13/2012 Time: 4098-1191 OT Time Calculation (min): 10 min  OT Assessment / Plan / Recommendation Comments on Treatment Session Pt progressing with therapy but limited this session by fatigue.    Follow Up Recommendations  SNF    Barriers to Discharge       Equipment Recommendations  None recommended by OT    Recommendations for Other Services    Frequency Min 2X/week   Plan Discharge plan remains appropriate    Precautions / Restrictions Precautions Precautions: Fall Restrictions Weight Bearing Restrictions: No   Pertinent Vitals/Pain HR 120 initially, fluctuated between 120-149 during ambulating and peaked at 160 so ambulation terminated, HR remained elevated even sitting in the chair (130-140s)     ADL  Grooming: Supervision/safety;Set up;Wash/dry face Where Assessed - Grooming: Supported sitting Lower Body Dressing: Moderate assistance Where Assessed - Lower Body Dressing: Supported sit to stand Toilet Transfer: Hydrographic surveyor Method: Sit to Barista: Bedside commode Equipment Used: Gait belt;Rolling walker Transfers/Ambulation Related to ADLs: supervision/min guard A with RW ambulation. increased assist needed for lines ADL Comments: pt limited by fatigue    OT Diagnosis:    OT Problem List:   OT Treatment Interventions:     OT Goals Acute Rehab OT Goals Time For Goal Achievement: 04/20/12 ADL Goals Pt Will Perform Grooming: with supervision;Standing at sink;Unsupported ADL Goal: Grooming - Progress: Progressing toward goals Pt Will Perform Lower Body Bathing: with supervision;with set-up;Sit to stand from chair;Unsupported;with adaptive equipment Pt Will Transfer to Toilet: with supervision;Ambulation;with DME;3-in-1 ADL Goal: Toilet Transfer - Progress: Progressing toward goals Additional ADL Goal #1: Pt will  verbalize 2 E conservation techniques to use during ADL task ADL Goal: Additional Goal #1 - Progress: Progressing toward goals  Visit Information  Last OT Received On: 04/13/12 Assistance Needed: +2 (for lines)    Subjective Data      Prior Functioning       Cognition  Overall Cognitive Status: Appears within functional limits for tasks assessed/performed Arousal/Alertness: Awake/alert Orientation Level: Appears intact for tasks assessed Behavior During Session: [facility] for tasks performed    Mobility  Shoulder Instructions Bed Mobility Supine to Sit: 6: Modified independent (Device/Increase time) Sitting - Scoot to Edge of Bed: 6: Modified independent (Device/Increase time) Transfers Sit to Stand: 5: Supervision;With upper extremity assist Stand to Sit: 5: Supervision;With upper extremity assist Details for Transfer Assistance: verbal cues for safe hand placement       Exercises  General Exercises - Lower Extremity Gluteal Sets: AROM;Both;10 reps;Seated Long Arc Quad: AROM;Both;10 reps;Seated Hip Flexion/Marching: AROM;Both;10 reps;Seated Toe Raises: AROM;Both;10 reps;Seated Heel Raises: AROM;Both;10 reps;Seated   Balance     End of Session    GO     Caitlyn Hardy 04/13/2012, 3:03 PM

## 2012-04-13 NOTE — Progress Notes (Signed)
Pt c/o Left shoulder pain 10/10. Vicodin 2 tabs given as PRN order. After 10 mins. Pt still c/o of Left shoulder pain & left chest area. EKG done without changes. Potassium today=3.2. PA Hurman Horn informed. Waiting for orders. Will continue to monitor pt.

## 2012-04-13 NOTE — Progress Notes (Signed)
Physical Therapy Treatment Patient Details Name: Caitlyn Hardy MRN: [id number] DOB: 12-30-1945 Today's Date: 04/13/2012 Time: 4098-1191 PT Time Calculation (min): 26 min  PT Assessment / Plan / Recommendation Comments on Treatment Session  Admitted with atrial thrombus and mid brain infarct with A-fib. Session limited by elevated HR, fluctuating between 120s and 150s today peaking at 160 so pt sat and rested. Ambulating well but limited by HR.     Follow Up Recommendations  SNF     Does the patient have the potential to tolerate intense rehabilitation     Barriers to Discharge        Equipment Recommendations  None recommended by PT    Recommendations for Other Services    Frequency Min 3X/week   Plan Discharge plan remains appropriate;Frequency remains appropriate    Precautions / Restrictions Precautions Precautions: Fall Restrictions Weight Bearing Restrictions: No   Pertinent Vitals/Pain HR 120 initially, fluctuated between 120-149 during ambulating and peaked at 160 so ambulation terminated, HR remained elevated even sitting in the chair (130-140s)    Mobility  Bed Mobility Supine to Sit: 6: Modified independent (Device/Increase time) Sitting - Scoot to Edge of Bed: 6: Modified independent (Device/Increase time) Transfers Sit to Stand: 5: Supervision;With upper extremity assist Stand to Sit: 5: Supervision;With upper extremity assist Details for Transfer Assistance: verbal cues for safe hand placement Ambulation/Gait Ambulation/Gait Assistance: 5: Supervision Ambulation Distance (Feet): 15 Feet Assistive device: Rolling walker Ambulation/Gait Assistance Details: limited activity because of HR (fluctuating between 120s and 150s with briefly in 160s, RN aware); cues for proximity to RW Gait Pattern: Shuffle Gait velocity: decreased    Exercises General Exercises - Lower Extremity Gluteal Sets: AROM;Both;10 reps;Seated Long Arc Quad: AROM;Both;10 reps;Seated Hip  Flexion/Marching: AROM;Both;10 reps;Seated Toe Raises: AROM;Both;10 reps;Seated Heel Raises: AROM;Both;10 reps;Seated   PT Diagnosis:    PT Problem List:   PT Treatment Interventions:     PT Goals Acute Rehab PT Goals Pt will go Sit to Stand: with modified independence PT Goal: Sit to Stand - Progress: Updated due to goal met Pt will go Stand to Sit: with modified independence PT Goal: Stand to Sit - Progress: Updated due to goals met Pt will Transfer Bed to Chair/Chair to Bed: with modified independence PT Transfer Goal: Bed to Chair/Chair to Bed - Progress: Updated due to goal met PT Goal: Ambulate - Progress: Progressing toward goal  Visit Information  Last PT Received On: 04/13/12 Assistance Needed: +1 (+2 for lines)    Subjective Data  Subjective: I just fell asleep. I haven't slept in 2 days.   Cognition  Overall Cognitive Status: Appears within functional limits for tasks assessed/performed Arousal/Alertness: Awake/alert Orientation Level: Appears intact for tasks assessed Behavior During Session: [facility] for tasks performed    Balance     End of Session PT - End of Session Equipment Utilized During Treatment: Gait belt Activity Tolerance: Patient tolerated treatment well;Treatment limited secondary to medical complications (Comment) (elevated HR) Patient left: in chair;with call bell/phone within reach Nurse Communication: Mobility status   GP     [facility] HELEN 04/13/2012, 1:10 PM

## 2012-04-14 ENCOUNTER — Inpatient Hospital Stay (HOSPITAL_COMMUNITY): Payer: Medicare Other

## 2012-04-14 ENCOUNTER — Other Ambulatory Visit: Payer: Self-pay

## 2012-04-14 ENCOUNTER — Encounter (HOSPITAL_COMMUNITY): Payer: Self-pay | Admitting: Radiology

## 2012-04-14 DIAGNOSIS — I4891 Unspecified atrial fibrillation: Secondary | ICD-10-CM

## 2012-04-14 DIAGNOSIS — I469 Cardiac arrest, cause unspecified: Secondary | ICD-10-CM

## 2012-04-14 LAB — BASIC METABOLIC PANEL
BUN: 31 mg/dL — ABNORMAL HIGH (ref 6–23)
CO2: 37 mEq/L — ABNORMAL HIGH (ref 19–32)
CO2: 35 mEq/L — ABNORMAL HIGH (ref 19–32)
Calcium: 9.3 mg/dL (ref 8.4–10.5)
Calcium: 8.7 mg/dL (ref 8.4–10.5)
Chloride: 88 mEq/L — ABNORMAL LOW (ref 96–112)
Creatinine, Ser: 2.47 mg/dL — ABNORMAL HIGH (ref 0.50–1.10)
GFR calc Af Amer: 23 mL/min — ABNORMAL LOW (ref >90)
Sodium: 136 mEq/L (ref 135–145)

## 2012-04-14 LAB — CBC
MCH: 21.1 pg — ABNORMAL LOW (ref 26.0–34.0)
Platelets: 300 10*3/uL (ref 150–400)
RBC: 4.45 MIL/uL (ref 3.87–5.11)
RDW: 19.9 % — ABNORMAL HIGH (ref 11.5–15.5)
WBC: 10.4 10*3/uL (ref 4.0–10.5)

## 2012-04-14 LAB — GLUCOSE, CAPILLARY: Glucose-Capillary: 151 mg/dL — ABNORMAL HIGH (ref 70–99)

## 2012-04-14 LAB — MAGNESIUM: Magnesium: 1.8 mg/dL (ref 1.5–2.5)

## 2012-04-14 LAB — CARBOXYHEMOGLOBIN
Methemoglobin: 1.7 % — ABNORMAL HIGH (ref 0.0–1.5)
Total hemoglobin: 9.4 g/dL — ABNORMAL LOW (ref 12.0–16.0)

## 2012-04-14 MED ORDER — MORPHINE SULFATE 2 MG/ML IJ SOLN: 2.0000 mg | INTRAMUSCULAR | Status: DC | PRN

## 2012-04-14 MED ORDER — LIDOCAINE BOLUS VIA INFUSION
100.0000 mg | Freq: Once | INTRAVENOUS | Status: DC
  Administered 2012-04-14: 100 mg via INTRAVENOUS
  Filled 2012-04-14: qty 100

## 2012-04-14 MED ORDER — LIDOCAINE IN D5W 4-5 MG/ML-% IV SOLN
1.0000 mg/min | INTRAVENOUS | Status: DC
  Administered 2012-04-14: 1 mg/min via INTRAVENOUS
  Filled 2012-04-14: qty 250

## 2012-04-14 MED ORDER — NOREPINEPHRINE BITARTRATE 1 MG/ML IJ SOLN
2.0000 ug/min | INTRAVENOUS | Status: DC
  Administered 2012-04-14: 5 ug/min via INTRAVENOUS
  Filled 2012-04-14: qty 8

## 2012-04-14 MED ORDER — POTASSIUM CHLORIDE 20 MEQ/15ML (10%) PO LIQD
60.0000 meq | Freq: Once | ORAL | Status: AC
  Administered 2012-04-14: 60 meq via ORAL
  Filled 2012-04-14: qty 45

## 2012-04-14 MED ORDER — MAGNESIUM SULFATE 40 MG/ML IJ SOLN
2.0000 g | Freq: Once | INTRAMUSCULAR | Status: AC
  Administered 2012-04-14: 2 g via INTRAVENOUS
  Filled 2012-04-14 (×2): qty 50

## 2012-04-14 NOTE — Progress Notes (Signed)
Pt with continued decompensation overnight - pt remained in AF with RVR 160s and had continued monomorphic VT episodes despite starting lidocaine earlier in the night.  RN called to state pt's SBPs 70s and pt having VT.  On arrival to room, pt not feeling well, less responsive-->unresponsive.  SBPs 60s-->50s.  Emergent synced DCCV performed at 200J.  After cardioversion, pt in sinus rhythm with PACs, PVCs.  SBPs 80-90s and pt states she feels "much better."  She says before the shock, she "felt like she was going to die." She is now alert and oriented, moving all extremities, cranial nerves are grossly intact.  Will decrease milrinone dose and lasix gtt dose given that pt was almost 3L negative and milrinone may have contributed to her arrhythmia burden.  Will have neuro checks performed Q2 hours.  Continue pradaxa.  Check stat labs.      CC time: 45 mins

## 2012-04-14 NOTE — Progress Notes (Signed)
MAP now 66 on 5 levo, RN will down titrate  Family is at bedside.   Blurry vision has improved at this time

## 2012-04-14 NOTE — Progress Notes (Signed)
BMET results discussed with Dr Shirlee Latch.   Creatinine trending up slightly. CVP 13. Levophed at 4 mcg.   Continue current course and wean Levophed.   Nasiah Polinsky NP-C 4:15 PM

## 2012-04-14 NOTE — Progress Notes (Signed)
Stroke Team Progress Note  HISTORY Caitlyn Hardy is an 67 y.o. female who initially presented to [facility] where she was admitted with an acute cerebellar infarct. The patient developed afib with RVR and was transferred here 03/25/2012 for further management. Patient underwent a cath with no significant CAD noted but TEE was performed as well that revealed an atrial thrombus and therefore patient was not cardioverted. Patient has been on Coumadin. INR 1.8.   On 04/03/12 while talking with visitors she had acute onset of slurred speech and left sided weakness. Code stroke was called at that time. She was not a tPA candidate secondary to elevated INR, recent infarct.  04/14/2012 patient had rapid heart rates and VT and had to be emergently cardioverted earlier tonight. During workup for her stroke, there was concern for a atrial appendage thrombus(unclear if visualized, or possible because left atrial appendage difficult to visualize) and therefore she was not cardioverted earlier. Following cardioversion, she complained that her vision was blurred "It's hard to focus on anything".   SUBJECTIVE No family is at the bedside.  Overall she feels her new symptoms since last night are completely resolved.    OBJECTIVE Most recent Vital Signs: Filed Vitals:   04/14/12 0400 04/14/12 0500 04/14/12 0600 04/14/12 0840  BP: 90/33 75/30 114/48   Pulse: 79 71 74   Temp: 98.4 F (36.9 C)     TempSrc:      Resp: 21 24 17    Height:      Weight:  124.5 kg (274 lb 7.6 oz)    SpO2: 94% 94% 98% 98%   CBG (last 3)   Basename 04/14/12 0816 04/13/12 2152 04/13/12 1645  GLUCAP 196* 107* 147*   IV Fluid Intake:     . sodium chloride 10 mL/hr (04/07/12 0821)  . amiodarone (NEXTERONE PREMIX) 360 mg/200 mL dextrose 30.06 mg/hr (04/13/12 2100)  . norepinephrine (LEVOPHED) Adult infusion 8 mcg/min (04/14/12 3086)   MEDICATIONS    . atorvastatin  10 mg Oral q1800  . budesonide  0.5 mg Nebulization BID  .  dabigatran  75 mg Oral Q12H  . digoxin  0.125 mg Oral Daily  . insulin aspart  0-15 Units Subcutaneous TID WC  . montelukast  10 mg Oral QHS  . polyethylene glycol  17 g Oral Daily  . rOPINIRole  2 mg Oral QHS  . traZODone  150 mg Oral QHS   PRN:  sodium chloride, sodium chloride, acetaminophen, acetaminophen, HYDROcodone-acetaminophen, levalbuterol, morphine injection, ondansetron (ZOFRAN) IV, ondansetron, promethazine, zolpidem  Diet:  Cardiac thin liquids Activity:  As tolerated  DVT Prophylaxis:  SCDs   CLINICALLY SIGNIFICANT STUDIES Basic Metabolic Panel:  Lab 04/14/12 5784 04/13/12 1620 04/13/12 0429  NA 136 137 --  K 3.2* 3.9 --  CL 88* 89* --  CO2 37* 38* --  GLUCOSE 135* 149* --  BUN 26* 25* --  CREATININE 2.38* 1.97* --  CALCIUM 9.3 9.5 --  MG 1.8 -- 2.2  PHOS -- -- --   Liver Function Tests: No results found for this basename: AST:2,ALT:2,ALKPHOS:2,BILITOT:2,PROT:2,ALBUMIN:2 in the last 168 hours CBC:  Lab 04/14/12 0325 04/13/12 0429  WBC 10.4 4.6  NEUTROABS -- --  HGB 9.4* 8.8*  HCT 32.5* 29.6*  MCV 73.0* 72.2*  PLT 300 300   Lipid Panel    Component Value Date/Time   CHOL 88 04/06/2012 0443   TRIG 90 04/06/2012 0443   HDL 26* 04/06/2012 0443   CHOLHDL 3.4 04/06/2012 0443   VLDL 18  04/06/2012 0443   LDLCALC 44 04/06/2012 0443   HgbA1C  Lab Results  Component Value Date   HGBA1C 7.1* 03/26/2012   Urine Drug Screen:   No results found for this basename: labopia, cocainscrnur, labbenz, amphetmu, thcu, labbarb    Alcohol Level: No results found for this basename: ETH:2 in the last 168 hours  CT of the brain  04/03/2012    1.  Possible tiny, old right mid brain lacunar infarct. 2.  Streak artifacts crossing the pons, making it difficult to exclude a pontine infarct. 3.  No intracranial hemorrhage or definitive CT findings to indicate acute infarction. 4.  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    MRI of the brain  04/05/2012   1.  Patchy acute  to subacute right cerebellar (SCA) territory infarcts.  No mass effect or hemorrhage. 2.  Questionable acute to subacute punctate right pontine and right MCA territory infarcts. The supra tentorial foci and might represent synchronous small vessel disease.   MRA of the brain 04/05/2012  1.  Decreased flow in the distal left vertebral artery which may be stenotic and/or nondominant.  It functionally terminates in the left PICA which appears remain patent.  2.  Dominant right vertebral artery and basilar artery without stenosis.  Bilateral PICA, AICA, and SCA origins and proximal segments appear patent.  3.  Fetal type right PCA origin.  Remaining posterior circulation and visualized anterior circulation otherwise within normal limits.    Physical Exam  Obese middle aged Caucasian lady not in distress.Awake alert. Afebrile. Head is nontraumatic. Neck is supple without bruit. Hearing is normal. Cardiac exam no murmur or gallop. Lungs are clear to auscultation. Distal pulses are well felt.   Neurological Exam :Awake alert oriented x 3 normal speech and language. Mild left lower face asymmetry. Tongue midline. No drift. Mild diminished fine finger movements on left. Orbits right over left upper extremity. Mild left grip weak.. Normal sensation . Normal coordination. Gait deferred ASSESSMENT Ms. Caitlyn Hardy is a 67 y.o. female presenting initially to [facility] with altered mental status and found to have a cerebellar infarct due to new onset afib and was started on coumadin. She developed slurred speech and left sided weakness after transfer to [facility] while on coumadin with INR 1.8. TEE revealed incidental cardiac thrombus. Infarct(s) felt to be embolic secondary to two known possible sources, left atrial thrombus and atrial fibrillation. Patient changed to pradaxa. Pt now with transient vision change and facial droop post emergent cardioversion, ? New strokes.  Hospital day #  20  TREATMENT/PLAN  Continue pradaxa for secondary stroke prevention.  No need for MRI, it will not change treatment course. Will check CT head.  Agreeable to SNF for rehab. Ok to continue current plan per stroke team.  Annie Main, MSN, RN, ANVP-BC, ANP-BC, GNP-BC [facility] Pager: [phone] 04/14/2012 9:55 AM  I have personally obtained a history, examined the patient, evaluated imaging results, and formulated the assessment and plan of care. I agree with the above.  Delia Heady, MD Medical Director [facility] Pager: [phone] 04/14/2012 5:21 PM

## 2012-04-14 NOTE — Progress Notes (Signed)
Tried to call pt's son about pts condition.  Unable to go through at this time.  Will continue to try.  Emilie [place] Liter

## 2012-04-14 NOTE — Progress Notes (Addendum)
Patient ID: Caitlyn Hardy, female   DOB: [birth year], 67 y.o.   MRN: [id number]     SUBJECTIVE:  Events last night reviewed.  She went into rapid afib with runs of WCT and developed hypotension.  Lidocaine and norepinephrine were started.  She was emergently cardioverted to NSR last night.  There was concern due to ? mild left facial droop and visual changes.  At the time of the visual changes, of note, she was hypotensive. CVP this morning is 5.    Marland Kitchen atorvastatin  10 mg Oral q1800  . budesonide  0.5 mg Nebulization BID  . dabigatran  75 mg Oral Q12H  . digoxin  0.125 mg Oral Daily  . insulin aspart  0-15 Units Subcutaneous TID WC  . montelukast  10 mg Oral QHS  . polyethylene glycol  17 g Oral Daily  . rOPINIRole  2 mg Oral QHS  . traZODone  150 mg Oral QHS  amiodarone gtt Milrinone gtt @ 0.125 Lasix gtt @ 4 Norepinephrine 8 Lidocaine 1 mg/min  Filed Vitals:   04/14/12 0300 04/14/12 0400 04/14/12 0500 04/14/12 0600  BP: 96/36 90/33 75/30  114/48  Pulse: 82 79 71 74  Temp:  98.4 F (36.9 C)    TempSrc:      Resp: 19 21 24 17   Height:      Weight:   274 lb 7.6 oz (124.5 kg)   SpO2: 94% 94% 94% 98%    Intake/Output Summary (Last 24 hours) at 04/14/12 0737 Last data filed at 04/14/12 0300  Gross per 24 hour  Intake 874.32 ml  Output   2925 ml  Net -2050.68 ml    LABS: Basic Metabolic Panel:  Basename 04/14/12 0325 04/13/12 1620 04/13/12 0429  NA 136 137 --  K 3.2* 3.9 --  CL 88* 89* --  CO2 37* 38* --  GLUCOSE 135* 149* --  BUN 26* 25* --  CREATININE 2.38* 1.97* --  CALCIUM 9.3 9.5 --  MG 1.8 -- 2.2  PHOS -- -- --   Liver Function Tests: No results found for this basename: AST:2,ALT:2,ALKPHOS:2,BILITOT:2,PROT:2,ALBUMIN:2 in the last 72 hours No results found for this basename: LIPASE:2,AMYLASE:2 in the last 72 hours CBC:  Basename 04/14/12 0325 04/13/12 0429  WBC 10.4 4.6  NEUTROABS -- --  HGB 9.4* 8.8*  HCT 32.5* 29.6*  MCV 73.0* 72.2*  PLT 300 300     PHYSICAL EXAM General: NAD Neck: JVP 9 cm, no thyromegaly or thyroid nodule.  Lungs: Crackles at bases bilaterally CV: Nondisplaced PMI.  Heart mildly tachycardic, irregular S1/S2, no S3/S4, 1/6 HSM LLSB.  Trace ankle edema.   Abdomen: Soft, nontender, no hepatosplenomegaly, no distention.  Neurologic: Alert and oriented x 3.  Psych: Normal affect. Extremities: No clubbing or cyanosis.   TELEMETRY: NSR in 60s-70s  ASSESSMENT AND PLAN:  67 yo admitted with acute cerebellar CVA on 12/26 also with NSTEMI (patent grafts on cath), acute on chronic systolic CHF, and poorly controlled atrial fibrillation.  1. CHF: Acute on chronic systolic.  She has diuresed well and CVP is 5.  Creatinine has gone up after hypotensive episode.  Stop Lasix today, likely restart po diuretic if stable tomorrow.  She is still on norepinephrine and milrinone 0.125.  I will stop milrinone today and also stop lidocaine, which is a negative inotrope.  Hopefully with these changes, we can titrate off norepinephrine.  BP is already better this morning.  2. ? CVA: Visual changes (now resolved that she is no longer  hypotensive) and ? Left mild left facial droop.  She was emergently cardioverted earlier (unable to visualize LAA appendage on prior TEE).  Continue Pradaxa (renally dosed with GFR < 30).  MRI head to be done, neuro following.  3. Atrial fibrillation:  She was not cardioverted earlier because LAA appendage could not be adequately visualized on TEE.  Emergent cardioversion last night and now in NSR.   Stop milrinone (should decrease propensity to go back into afib). Continue amiodarone gtt.  Will hold Toprol XL while on pressor (norepinephrine) and hypotensive.  4. CKD: AKI in setting of hypotensive episode last night.  As above, making changes to keep BP up and limit renal effect.  5. CAD: Grafts patent on cath this hospitalization.  Suspect NSTEMI due to demand ischemia.  6. WCT: Overnight, concern for VT.  She is  on combination of amiodarone and lidocaine.  I will turn off milrinone which may have driven VT and also lidocaine this morning.  Continue amiodarone.   [name] 04/14/2012 7:37 AM

## 2012-04-14 NOTE — Progress Notes (Signed)
Pt with increased HRs throughout the day and wide complex tachycardia tonight.  Day team was called about this issues earlier and checked K and repleted, but pt did not improve.  At this time, pt is asymptomatic from a cardiac perspective, but complains of nausea and ear pain.  She denies CP, SOB, palpitations.  Pt with repeated runs of about 20-30 beats of wide complex tachycardia - appears to be VT.  Pt is already on amio, BB, digoxin.  SBPs 90s, precluding giving her more BB.  Will give bolus of lidocaine and start gtt at  1mg /min (doing lower dose given CHF and renal dysfunction).  If pt continues to have WCT, can also consider decreasing milrinone gtt given its propensity to cause arrhythmia.  LAA clot was not able to excluded on recent TEE, so DCCV is not an option for her.  Transferring pt to ICU.  Will need EP consult in am.

## 2012-04-14 NOTE — Progress Notes (Signed)
K 3.2, Mag 1.8 - will replete K and Mag  BP 90/33, MAP 52 - will start low dose levo to help keep map > 60-65.  Check blood cx.  Pt may be intravascularly dry given extensive diuresis.  Already cut back on milrinone and lasix gtt earlier in night.  Will order CVP and SVO2 to be done.

## 2012-04-14 NOTE — Progress Notes (Signed)
Having arterial line placed by respiratory therapy for closer monitoring of BPs.  Went back to check on pt and now she is saying vision is blurry, hard to focus on things and says this is similar to one of the strokes she has had in recent past.  Will reconsult neurology to evaluate pt and provide further recommendations.  She is already on pradaxa.

## 2012-04-14 NOTE — Progress Notes (Signed)
67 yo F recently followed for likely embolic strokes who had rapid heart rates and VT and had to be emergently cardioverted earlier tonight. During workup for her stroke, there was concern for a atrial appendage thrombus(unclear if visualized, or possible because left atrial appendage difficult to visualize) and therefore she was not cardioverted earlier.   Following cardioversion, she complained that her vision was blurred "It's hard to focus on anything".   Exam: Filed Vitals:   04/14/12 0300  BP: 96/36  Pulse: 82  Resp: 19   Gen: In bed, NAD MS: Awake, alert, interactive and approriate. No signs of aphasia. Able to follow commands and answer questions immediately.  CN: II: Visual fields are full to confrontation III, IV, VI: PERRL, EOMI V: facial sensation symmetric VII: there is a lag in her right face when she smiles(patient thinks this is new and was not documented previously) VIII: intact to voice X: uvula midline XI: shoulder shrug symmetric XII: tongue midline Motor: Moves all extremities well to command, but all extremities are weak due to recent event.  Sensory: intact to LT in face arms and legs Cerebellar: Difficulty with FNF on right, but exam hampered because of multiple monitors Gait: not tested due to multiple drips.   Impression: 67 yo F with new very mild facial droop and mild vision changes. It is possible she has had new ischemia, though difficult to tell based on exam. An MRI could better determine if she has had new ischemia.  Recommendations: 1) MRI brain when stable.   Ritta Slot, MD [facility] [phone]  If 7pm- 7am, please page neurology on call at [phone].

## 2012-04-14 NOTE — Progress Notes (Signed)
Notified Md about pt's Hr 140-160 bp 94/33.  Pt c/o ear pain.  Pain medication given.  Will obtain EKG.  Will continue to monitor. Emilie [place] Liter

## 2012-04-14 NOTE — Progress Notes (Signed)
Called pt's son and discussed events of tonight with him - he is fully updated

## 2012-04-15 LAB — BASIC METABOLIC PANEL
BUN: 37 mg/dL — ABNORMAL HIGH (ref 6–23)
CO2: 36 mEq/L — ABNORMAL HIGH (ref 19–32)
GFR calc non Af Amer: 18 mL/min — ABNORMAL LOW (ref >90)
Glucose, Bld: 107 mg/dL — ABNORMAL HIGH (ref 70–99)
Potassium: 3.5 mEq/L (ref 3.5–5.1)

## 2012-04-15 LAB — GLUCOSE, CAPILLARY
Glucose-Capillary: 78 mg/dL (ref 70–99)
Glucose-Capillary: 123 mg/dL — ABNORMAL HIGH (ref 70–99)
Glucose-Capillary: 103 mg/dL — ABNORMAL HIGH (ref 70–99)
Glucose-Capillary: 134 mg/dL — ABNORMAL HIGH (ref 70–99)
Glucose-Capillary: 121 mg/dL — ABNORMAL HIGH (ref 70–99)

## 2012-04-15 LAB — DIGOXIN LEVEL: Digoxin Level: 0.7 ng/mL — ABNORMAL LOW (ref 0.8–2.0)

## 2012-04-15 LAB — CBC
HCT: 27.9 % — ABNORMAL LOW (ref 36.0–46.0)
Hemoglobin: 8.2 g/dL — ABNORMAL LOW (ref 12.0–15.0)
MCHC: 29.4 g/dL — ABNORMAL LOW (ref 30.0–36.0)
RBC: 3.84 MIL/uL — ABNORMAL LOW (ref 3.87–5.11)

## 2012-04-15 MED ORDER — TORSEMIDE 20 MG PO TABS
60.0000 mg | ORAL_TABLET | Freq: Every day | ORAL | Status: DC
  Administered 2012-04-15 – 2012-04-19 (×5): 60 mg via ORAL
  Filled 2012-04-15 (×5): qty 3

## 2012-04-15 MED ORDER — AMIODARONE HCL 200 MG PO TABS
400.0000 mg | ORAL_TABLET | Freq: Two times a day (BID) | ORAL | Status: DC
  Administered 2012-04-15 – 2012-04-19 (×9): 400 mg via ORAL
  Filled 2012-04-15 (×10): qty 2

## 2012-04-15 MED ORDER — POTASSIUM CHLORIDE CRYS ER 20 MEQ PO TBCR
40.0000 meq | EXTENDED_RELEASE_TABLET | Freq: Once | ORAL | Status: AC
  Administered 2012-04-15: 40 meq via ORAL
  Filled 2012-04-15 (×2): qty 2

## 2012-04-15 MED ORDER — POTASSIUM CHLORIDE 20 MEQ PO PACK: 40.0000 meq | PACK | Freq: Once | ORAL | Status: DC

## 2012-04-15 MED ORDER — DIGOXIN 125 MCG PO TABS
0.1250 mg | ORAL_TABLET | ORAL | Status: DC
  Administered 2012-04-16 – 2012-04-18 (×2): 0.125 mg via ORAL
  Filled 2012-04-15 (×2): qty 1

## 2012-04-15 NOTE — Progress Notes (Signed)
Stroke Team Progress Note  HISTORY Caitlyn Hardy is an 67 y.o. female who initially presented to [facility] where she was admitted with an acute cerebellar infarct. The patient developed afib with RVR and was transferred here 03/25/2012 for further management. Patient underwent a cath with no significant CAD noted but TEE was performed as well that revealed an atrial thrombus and therefore patient was not cardioverted. Patient has been on Coumadin. INR 1.8.   On 04/03/12 while talking with visitors she had acute onset of slurred speech and left sided weakness. Code stroke was called at that time. She was not a tPA candidate secondary to elevated INR, recent infarct.  04/14/2012 patient had rapid heart rates and VT and had to be emergently cardioverted earlier tonight. During workup for her stroke, there was concern for a atrial appendage thrombus(unclear if visualized, or possible because left atrial appendage difficult to visualize) and therefore she was not cardioverted earlier. Following cardioversion, she complained that her vision was blurred "It's hard to focus on anything".   SUBJECTIVE No family is at the bedside.  Overall she feels her new symptoms since last night are completely resolved.    OBJECTIVE Most recent Vital Signs: Filed Vitals:   04/15/12 0530 04/15/12 0600 04/15/12 0630 04/15/12 0700  BP: 119/38 108/41 99/38 107/40  Pulse: 59 58 54 52  Temp:      TempSrc:      Resp: 17 17 14 17   Height:      Weight:      SpO2: 100% 100% 100% 100%   CBG (last 3)   Basename 04/14/12 2113 04/14/12 1715 04/14/12 1235  GLUCAP 78 108* 151*   IV Fluid Intake:      . sodium chloride 10 mL/hr (04/07/12 0821)  . norepinephrine (LEVOPHED) Adult infusion 2 mcg/min (04/14/12 1840)   MEDICATIONS     . amiodarone  400 mg Oral BID  . atorvastatin  10 mg Oral q1800  . budesonide  0.5 mg Nebulization BID  . dabigatran  75 mg Oral Q12H  . digoxin  0.125 mg Oral QODAY  . insulin  aspart  0-15 Units Subcutaneous TID WC  . montelukast  10 mg Oral QHS  . polyethylene glycol  17 g Oral Daily  . potassium chloride  40 mEq Oral Once  . rOPINIRole  2 mg Oral QHS  . torsemide  60 mg Oral Daily  . traZODone  150 mg Oral QHS   PRN:  sodium chloride, sodium chloride, acetaminophen, acetaminophen, HYDROcodone-acetaminophen, levalbuterol, morphine injection, ondansetron (ZOFRAN) IV, ondansetron, promethazine, zolpidem  Diet:  Cardiac thin liquids Activity:  As tolerated  DVT Prophylaxis:  SCDs   CLINICALLY SIGNIFICANT STUDIES Basic Metabolic Panel:   Lab 04/15/12 0300 04/14/12 1506 04/14/12 0325 04/13/12 0429  NA 134* 136 -- --  K 3.5 3.9 -- --  CL 89* 90* -- --  CO2 36* 35* -- --  GLUCOSE 107* 170* -- --  BUN 37* 31* -- --  CREATININE 2.57* 2.47* -- --  CALCIUM 8.4 8.7 -- --  MG -- -- 1.8 2.2  PHOS -- -- -- --   Liver Function Tests: No results found for this basename: AST:2,ALT:2,ALKPHOS:2,BILITOT:2,PROT:2,ALBUMIN:2 in the last 168 hours CBC:   Lab 04/15/12 0300 04/14/12 0325  WBC 8.0 10.4  NEUTROABS -- --  HGB 8.2* 9.4*  HCT 27.9* 32.5*  MCV 72.7* 73.0*  PLT 264 300   Lipid Panel    Component Value Date/Time   CHOL 88 04/06/2012 0443  TRIG 90 04/06/2012 0443   HDL 26* 04/06/2012 0443   CHOLHDL 3.4 04/06/2012 0443   VLDL 18 04/06/2012 0443   LDLCALC 44 04/06/2012 0443   HgbA1C  Lab Results  Component Value Date   HGBA1C 7.1* 03/26/2012   Urine Drug Screen:   No results found for this basename: labopia,  cocainscrnur,  labbenz,  amphetmu,  thcu,  labbarb    Alcohol Level: No results found for this basename: ETH:2 in the last 168 hours  CT of the brain  04/03/2012    1.  Possible tiny, old right mid brain lacunar infarct. 2.  Streak artifacts crossing the pons, making it difficult to exclude a pontine infarct. 3.  No intracranial hemorrhage or definitive CT findings to indicate acute infarction. 4.  Chronic left sphenoid sinusitis with a small acute  component.    MRI of the brain  04/05/2012   1.  Patchy acute to subacute right cerebellar (SCA) territory infarcts.  No mass effect or hemorrhage. 2.  Questionable acute to subacute punctate right pontine and right MCA territory infarcts. The supra tentorial foci and might represent synchronous small vessel disease.   MRA of the brain 04/05/2012  1.  Decreased flow in the distal left vertebral artery which may be stenotic and/or nondominant.  It functionally terminates in the left PICA which appears remain patent.  2.  Dominant right vertebral artery and basilar artery without stenosis.  Bilateral PICA, AICA, and SCA origins and proximal segments appear patent.  3.  Fetal type right PCA origin.  Remaining posterior circulation and visualized anterior circulation otherwise within normal limits.    Physical Exam  Obese middle aged Caucasian lady not in distress.Awake alert. Afebrile. Head is nontraumatic. Neck is supple without bruit. Hearing is normal. Cardiac exam no murmur or gallop. Lungs are clear to auscultation. Distal pulses are well felt.   Neurological Exam :Awake alert oriented x 3 normal speech and language. Mild left lower face asymmetry. Tongue midline. No drift. Mild diminished fine finger movements on left. Orbits right over left upper extremity. Mild left grip weak.. Normal sensation . Normal coordination. Gait deferred ASSESSMENT Ms. Caitlyn Hardy is a 67 y.o. female presenting initially to [facility] with altered mental status and found to have a cerebellar infarct due to new onset afib and was started on coumadin. She developed slurred speech and left sided weakness after transfer to [facility] while on coumadin with INR 1.8. TEE revealed incidental cardiac thrombus. Infarct(s) felt to be embolic secondary to two known possible sources, left atrial thrombus and atrial fibrillation. Patient changed to pradaxa. Pt with transient vision change and facial droop post emergent  cardioversion, CT unchanged, no further vision change or facial weakness.  Hospital day # 21  TREATMENT/PLAN  Continue pradaxa for secondary stroke prevention.  Agreeable to SNF for rehab.   Stroke Service will sign off. Follow up with Dr. Pearlean Brownie, Stroke Clinic, in 2 months as previously recommended. Please let us know if you need anything further.  Annie Main, MSN, RN, ANVP-BC, ANP-BC, [facility] Pager: [phone] 04/15/2012 8:29 AM  I have personally obtained a history, examined the patient, evaluated imaging results, and formulated the assessment and plan of care. I agree with the above.  Delia Heady, MD Medical Director [facility] Pager: [phone] 04/15/2012 8:29 AM

## 2012-04-15 NOTE — Progress Notes (Signed)
Physical Therapy Treatment Patient Details Name: Caitlyn Hardy MRN: [id number] DOB: 05-21-1945 Today's Date: 04/15/2012 Time: 0865-7846 PT Time Calculation (min): 24 min  PT Assessment / Plan / Recommendation Comments on Treatment Session  Patient s/p emergent cardioversion 1/15 and just today weaned off pressors and able to tolerate getting up to chair with VSS.  Will continue to benefit from skilled PT to address activity tolerance, generalized weakness and balance issues.    Follow Up Recommendations  SNF           Equipment Recommendations  None recommended by PT       Frequency Min 3X/week      Precautions / Restrictions Precautions Precautions: Fall Precaution Comments: contact with shingles wounds left buttock   Pertinent Vitals/Pain Min c/o pain at buttock wounds; VSS throughout tx (BP 102/71 sitting up)    Mobility  Bed Mobility Supine to Sit: 5: Supervision;With rails;HOB elevated Sitting - Scoot to Edge of Bed: 6: Modified independent (Device/Increase time) Details for Bed Mobility Assistance: increased time Transfers Sit to Stand: 4: Min assist;From bed;With upper extremity assist Stand Pivot Transfers: 4: Min assist (bed to recliner with walker) Details for Transfer Assistance: slow and deliberate with multiple lines    Exercises General Exercises - Lower Extremity Long Arc Quad: AROM;Both;10 reps;Seated Hip ABduction/ADduction: AROM;Both;10 reps;Seated Hip Flexion/Marching: AROM;Both;10 reps;Seated Toe Raises: AROM;10 reps;Seated;Both Heel Raises: AROM;10 reps;Seated;Both    PT Goals Acute Rehab PT Goals Pt will go Supine/Side to Sit: with modified independence PT Goal: Supine/Side to Sit - Progress: Progressing toward goal Pt will go Sit to Stand: with modified independence PT Goal: Sit to Stand - Progress: Progressing toward goal Pt will go Stand to Sit: with modified independence PT Goal: Stand to Sit - Progress: Progressing toward goal Pt will  Transfer Bed to Chair/Chair to Bed: with modified independence PT Transfer Goal: Bed to Chair/Chair to Bed - Progress: Progressing toward goal  Visit Information  Last PT Received On: 04/15/12    Subjective Data  Subjective: I need the chair set up   Cognition  Overall Cognitive Status: Appears within functional limits for tasks assessed/performed Arousal/Alertness: Awake/alert Behavior During Session: [facility] for tasks performed    Balance  Static Standing Balance Static Standing - Balance Support: Bilateral upper extremity supported Static Standing - Level of Assistance: 5: Stand by assistance Static Standing - Comment/# of Minutes: with walker  End of Session PT - End of Session Equipment Utilized During Treatment: Gait belt Activity Tolerance: Patient tolerated treatment well Patient left: in chair;with nursing in room   GP     [facility] 04/15/2012, 1:09 PM [place], PT [phone] 04/15/2012

## 2012-04-15 NOTE — Progress Notes (Signed)
Patient ID: Caitlyn Hardy, female   DOB: [birth year], 67 y.o.   MRN: [id number]  SUBJECTIVE:  Stable this morning.  She remains in NSR.  CT head was unremarkable.  No further visual blurring.  Norepinephrine weaned to 1.  Not short of breath.  Mild rise in creatinine.  CVP 9.     Marland Kitchen atorvastatin  10 mg Oral q1800  . budesonide  0.5 mg Nebulization BID  . dabigatran  75 mg Oral Q12H  . digoxin  0.125 mg Oral Daily  . insulin aspart  0-15 Units Subcutaneous TID WC  . montelukast  10 mg Oral QHS  . polyethylene glycol  17 g Oral Daily  . rOPINIRole  2 mg Oral QHS  . traZODone  150 mg Oral QHS  amiodarone gtt Norepinephrine @ 1  Filed Vitals:   04/15/12 0530 04/15/12 0600 04/15/12 0630 04/15/12 0700  BP: 119/38 108/41 99/38 107/40  Pulse: 59 58 54 52  Temp:      TempSrc:      Resp: 17 17 14 17   Height:      Weight:      SpO2: 100% 100% 100% 100%    Intake/Output Summary (Last 24 hours) at 04/15/12 0725 Last data filed at 04/15/12 0700  Gross per 24 hour  Intake 844.83 ml  Output    880 ml  Net -35.17 ml    LABS: Basic Metabolic Panel:  Basename 04/15/12 0300 04/14/12 1506 04/14/12 0325 04/13/12 0429  NA 134* 136 -- --  K 3.5 3.9 -- --  CL 89* 90* -- --  CO2 36* 35* -- --  GLUCOSE 107* 170* -- --  BUN 37* 31* -- --  CREATININE 2.57* 2.47* -- --  CALCIUM 8.4 8.7 -- --  MG -- -- 1.8 2.2  PHOS -- -- -- --   Liver Function Tests: No results found for this basename: AST:2,ALT:2,ALKPHOS:2,BILITOT:2,PROT:2,ALBUMIN:2 in the last 72 hours No results found for this basename: LIPASE:2,AMYLASE:2 in the last 72 hours CBC:  Basename 04/15/12 0300 04/14/12 0325  WBC 8.0 10.4  NEUTROABS -- --  HGB 8.2* 9.4*  HCT 27.9* 32.5*  MCV 72.7* 73.0*  PLT 264 300    PHYSICAL EXAM General: NAD Neck: JVP 10 cm, no thyromegaly or thyroid nodule.  Lungs: Crackles at bases bilaterally CV: Nondisplaced PMI.  Heart mildly tachycardic, irregular S1/S2, no S3/S4, 1/6 HSM LLSB.  Trace ankle  edema.   Abdomen: Soft, nontender, no hepatosplenomegaly, no distention.  Neurologic: Alert and oriented x 3.  Psych: Normal affect. Extremities: No clubbing or cyanosis.   TELEMETRY: NSR in 50s-60s  ASSESSMENT AND PLAN:  67 yo admitted with acute cerebellar CVA on 12/26 also with NSTEMI (patent grafts on cath), acute on chronic systolic CHF, and poorly controlled atrial fibrillation.  1. CHF: Acute on chronic systolic.  She has diuresed well over her stay but CVP is 9 and she still has some volume overload (though improved).  She is now off milrinone and Lasix gtt, turning off norepinephrine this morning.   - Add torsemide 60 mg po daily, start around noon when she is off norepinephrine.  - Check digoxin level.  - Will add low dose hydralazine/nitrates when off norepinephrine with stable BP.  2. ? CVA: Visual changes when hypotensive.  She was emergently cardioverted (unable to visualize LAA appendage on prior TEE).  Continue Pradaxa (renally dosed with GFR < 30).  CT head unremarkable.   3. Atrial fibrillation:  She was not cardioverted earlier because LAA  appendage could not be adequately visualized on TEE.  Emergent cardioversion and now in NSR.  Will convert amiodarone to po today.  4. CKD: AKI in setting of hypotensive episode.  Small rise in creatinine today.  Adding back po diuretic so will need to follow creatinine closely.  5. CAD: Grafts patent on cath this hospitalization.  Suspect NSTEMI due to demand ischemia.  6. WCT: Concerning for VT.  Has not had any further for the last day.   [name] 04/15/2012 7:25 AM

## 2012-04-16 LAB — GLUCOSE, CAPILLARY
Glucose-Capillary: 93 mg/dL (ref 70–99)
Glucose-Capillary: 117 mg/dL — ABNORMAL HIGH (ref 70–99)
Glucose-Capillary: 115 mg/dL — ABNORMAL HIGH (ref 70–99)
Glucose-Capillary: 147 mg/dL — ABNORMAL HIGH (ref 70–99)

## 2012-04-16 LAB — BASIC METABOLIC PANEL
CO2: 38 mEq/L — ABNORMAL HIGH (ref 19–32)
CO2: 39 mEq/L — ABNORMAL HIGH (ref 19–32)
Calcium: 8.9 mg/dL (ref 8.4–10.5)
Chloride: 89 mEq/L — ABNORMAL LOW (ref 96–112)
Creatinine, Ser: 1.85 mg/dL — ABNORMAL HIGH (ref 0.50–1.10)
GFR calc Af Amer: 29 mL/min — ABNORMAL LOW (ref >90)
GFR calc non Af Amer: 27 mL/min — ABNORMAL LOW (ref >90)
Potassium: 2.6 mEq/L — CL (ref 3.5–5.1)
Sodium: 136 mEq/L (ref 135–145)
Sodium: 138 mEq/L (ref 135–145)

## 2012-04-16 LAB — CBC
MCV: 72.2 fL — ABNORMAL LOW (ref 78.0–100.0)
Platelets: 248 10*3/uL (ref 150–400)
RBC: 3.99 MIL/uL (ref 3.87–5.11)
RDW: 19.7 % — ABNORMAL HIGH (ref 11.5–15.5)
WBC: 4.5 10*3/uL (ref 4.0–10.5)

## 2012-04-16 MED ORDER — GLUCERNA SHAKE PO LIQD
237.0000 mL | ORAL | Status: DC
  Administered 2012-04-16 – 2012-04-19 (×3): 237 mL via ORAL
  Filled 2012-04-16: qty 237

## 2012-04-16 MED ORDER — POTASSIUM CHLORIDE CRYS ER 20 MEQ PO TBCR
40.0000 meq | EXTENDED_RELEASE_TABLET | ORAL | Status: AC
  Administered 2012-04-16 (×3): 40 meq via ORAL
  Filled 2012-04-16 (×2): qty 2

## 2012-04-16 MED ORDER — POTASSIUM CHLORIDE CRYS ER 20 MEQ PO TBCR
EXTENDED_RELEASE_TABLET | ORAL | Status: AC
  Filled 2012-04-16: qty 2

## 2012-04-16 MED ORDER — POTASSIUM CHLORIDE CRYS ER 20 MEQ PO TBCR: 40.0000 meq | EXTENDED_RELEASE_TABLET | ORAL | Status: DC

## 2012-04-16 MED ORDER — HYDRALAZINE HCL 10 MG PO TABS
10.0000 mg | ORAL_TABLET | Freq: Three times a day (TID) | ORAL | Status: DC
  Administered 2012-04-16 – 2012-04-19 (×11): 10 mg via ORAL
  Filled 2012-04-16 (×13): qty 1

## 2012-04-16 MED ORDER — ISOSORBIDE DINITRATE 10 MG PO TABS
10.0000 mg | ORAL_TABLET | Freq: Three times a day (TID) | ORAL | Status: DC
  Administered 2012-04-16 – 2012-04-19 (×11): 10 mg via ORAL
  Filled 2012-04-16 (×13): qty 1

## 2012-04-16 NOTE — Progress Notes (Signed)
Patient transferred from ICU to stepdown. Pt resting in the chair. Vitals stable and assessment complete.  Tyeler Goedken, Charlaine Dalton RN

## 2012-04-16 NOTE — Progress Notes (Signed)
Covering Clinical Child psychotherapist (CSW) contacted [facility] to inform that pt would potentially discharge on Monday. CSW to remain following and facilitate with dc planning when pt medically stable.  Theresia Bough, MSW, Theresia Majors [phone]

## 2012-04-16 NOTE — Progress Notes (Signed)
Patient ID: Caitlyn Hardy, female   DOB: 12-Sep-1945, 67 y.o.   MRN: [id number]  SUBJECTIVE:  Stable this morning.  She remains in NSR.  She is off all drips.  CVP down to 5.  Sat in chair most of day yesterday.  BP stable this morning.     . potassium chloride SA      . amiodarone  400 mg Oral BID  . atorvastatin  10 mg Oral q1800  . budesonide  0.5 mg Nebulization BID  . dabigatran  75 mg Oral Q12H  . digoxin  0.125 mg Oral QODAY  . hydrALAZINE  10 mg Oral Q8H  . insulin aspart  0-15 Units Subcutaneous TID WC  . isosorbide dinitrate  10 mg Oral Q8H  . montelukast  10 mg Oral QHS  . polyethylene glycol  17 g Oral Daily  . potassium chloride  40 mEq Oral Q2H  . rOPINIRole  2 mg Oral QHS  . torsemide  60 mg Oral Daily  . traZODone  150 mg Oral QHS    Filed Vitals:   04/16/12 0331 04/16/12 0400 04/16/12 0402 04/16/12 0725  BP: 117/56   105/54  Pulse: 56   54  Temp:   98.8 F (37.1 C) 98.6 F (37 C)  TempSrc:   Oral Oral  Resp: 18   13  Height:      Weight:  272 lb 11.3 oz (123.7 kg)    SpO2: 98%   100%    Intake/Output Summary (Last 24 hours) at 04/16/12 0729 Last data filed at 04/16/12 0400  Gross per 24 hour  Intake  497.2 ml  Output   2050 ml  Net -1552.8 ml    LABS: Basic Metabolic Panel:  Basename 04/16/12 0430 04/15/12 0300 04/14/12 0325  NA 136 134* --  K 2.6* 3.5 --  CL 89* 89* --  CO2 38* 36* --  GLUCOSE 95 107* --  BUN 38* 37* --  CREATININE 1.97* 2.57* --  CALCIUM 8.5 8.4 --  MG -- -- 1.8  PHOS -- -- --   Liver Function Tests: No results found for this basename: AST:2,ALT:2,ALKPHOS:2,BILITOT:2,PROT:2,ALBUMIN:2 in the last 72 hours No results found for this basename: LIPASE:2,AMYLASE:2 in the last 72 hours CBC:  Basename 04/16/12 0430 04/15/12 0300  WBC 4.5 8.0  NEUTROABS -- --  HGB 8.4* 8.2*  HCT 28.8* 27.9*  MCV 72.2* 72.7*  PLT 248 264    PHYSICAL EXAM General: NAD Neck: JVP 8-9 cm, no thyromegaly or thyroid nodule.  Lungs: Crackles  at bases bilaterally CV: Nondisplaced PMI.  Heart regular S1/S2, no S3/S4, 1/6 HSM LLSB.  Trace ankle edema.   Abdomen: Soft, nontender, no hepatosplenomegaly, no distention.  Neurologic: Alert and oriented x 3.  Psych: Normal affect. Extremities: No clubbing or cyanosis.   TELEMETRY: NSR in 60s  ASSESSMENT AND PLAN:  67 yo admitted with acute cerebellar CVA on 12/26 also with NSTEMI (patent grafts on cath), acute on chronic systolic CHF, and poorly controlled atrial fibrillation.  1. CHF: Acute on chronic systolic.  Weight is down 53 lbs since admission.  CVP now 5.  Off milrinone and Lasix gtt and now on po torsemide.  Good diuresis with po torsemide yesterday.    - Continue po torsemide - Continue qod digoxin - Add low doses of hyralazine and isordil today for afterload reduction and titrate up as tolerated.  - Would like to add low dose Coreg prior to discharge.  2. ? CVA: Visual changes when  hypotensive.  She was emergently cardioverted (unable to visualize LAA appendage on prior TEE).  Continue Pradaxa (renally dosed with GFR < 30).  CT head unremarkable.   3. Atrial fibrillation:  She was not cardioverted earlier because LAA appendage could not be adequately visualized on TEE.  Emergent cardioversion and now in NSR.  Now on po amiodarone.   4. CKD: AKI in setting of hypotensive episode.  Creatinine improved today.  Continue to follow closely. 5. CAD: Grafts patent on cath this hospitalization.  Suspect NSTEMI due to demand ischemia.  6. She can go to step-down when bed is available.  Continue PT work, aim for SNF on Monday.   [name] 04/16/2012 7:29 AM

## 2012-04-16 NOTE — Progress Notes (Signed)
INITIAL NUTRITION ASSESSMENT  DOCUMENTATION CODES Per approved criteria  -Morbid Obesity   INTERVENTION: 1. Glucerna Shake po daily, each supplement provides 220 kcal and 10 grams of protein. 2. RD will continue to follow     NUTRITION DIAGNOSIS: Inadequate oral intake related to poor appetite as evidenced by pt report.   Goal: PO intake to meet >/=90% estimated needs   Monitor:  PO intake, weight trends, I/O's  Reason for Assessment: PO po intake   67 y.o. female  Admitting Dx: Atrial fibrillation with RVR  ASSESSMENT: Pt admitted on 12/26, treated medically for CHF, s/p cath on 1/2. Pt developed slurred speech and chest pain. Rapid response called, Neuro consulted for stroke.   Pt reports that appetite is fair, not eating her usual amount. Family in room state "she eats like a bird." Pt is agreeable to Ensure once daily.   Pt weight has decreased significantly this admission, -24 L fluids.   Height: Ht Readings from Last 1 Encounters:  04/03/12 5' 0.6" (1.539 m)    Weight: Wt Readings from Last 1 Encounters:  04/16/12 272 lb 11.3 oz (123.7 kg)    Ideal Body Weight: 100 lbs   % Ideal Body Weight: 272%  Wt Readings from Last 10 Encounters:  04/16/12 272 lb 11.3 oz (123.7 kg)  04/16/12 272 lb 11.3 oz (123.7 kg)  04/16/12 272 lb 11.3 oz (123.7 kg)  04/16/12 272 lb 11.3 oz (123.7 kg)  325 lbs at admission-with fluid overload   Usual Body Weight: 325 lbs   % Usual Body Weight: 84%  BMI:  Body mass index is 52.21 kg/(m^2). Obesity class 3, morbid  Estimated Nutritional Needs: Kcal: 2000-2200 Protein: 65-75 gm  Fluid: 2-2.2 L/day  Skin: intact   Diet Order: Cardiac PO intake ~50%  EDUCATION NEEDS: -No education needs identified at this time   Intake/Output Summary (Last 24 hours) at 04/16/12 1125 Last data filed at 04/16/12 0400  Gross per 24 hour  Intake    300 ml  Output   2050 ml  Net  -1750 ml    Last BM: 1/9 last documented     Labs:   Lab 04/16/12 0430 04/15/12 0300 04/14/12 1506 04/14/12 0325 04/13/12 0429 04/10/12 1905  NA 136 134* 136 -- -- --  K 2.6* 3.5 3.9 -- -- --  CL 89* 89* 90* -- -- --  CO2 38* 36* 35* -- -- --  BUN 38* 37* 31* -- -- --  CREATININE 1.97* 2.57* 2.47* -- -- --  CALCIUM 8.5 8.4 8.7 -- -- --  MG -- -- -- 1.8 2.2 2.2  PHOS -- -- -- -- -- --  GLUCOSE 95 107* 170* -- -- --    CBG (last 3)   Basename 04/16/12 0724 04/15/12 2140 04/15/12 1821  GLUCAP 93 121* 134*    Scheduled Meds:   . amiodarone  400 mg Oral BID  . atorvastatin  10 mg Oral q1800  . budesonide  0.5 mg Nebulization BID  . dabigatran  75 mg Oral Q12H  . digoxin  0.125 mg Oral QODAY  . hydrALAZINE  10 mg Oral Q8H  . insulin aspart  0-15 Units Subcutaneous TID WC  . isosorbide dinitrate  10 mg Oral Q8H  . montelukast  10 mg Oral QHS  . polyethylene glycol  17 g Oral Daily  . potassium chloride SA      . potassium chloride  40 mEq Oral Q2H  . rOPINIRole  2 mg Oral QHS  .  torsemide  60 mg Oral Daily  . traZODone  150 mg Oral QHS    Continuous Infusions:   . sodium chloride 10 mL/hr (04/07/12 1610)    Past Medical History  Diagnosis Date  . Myocardial infarction     2011  . Hypertension   . Dysrhythmia 2013    afib  . Depression   . Shortness of breath   . COPD (chronic obstructive pulmonary disease)   . Sleep apnea     CPAP hs  . Diabetes mellitus without complication   . Stroke 2013  . CHF (congestive heart failure)   . Chronic kidney disease   . H/O hiatal hernia   . Headache   . Arthritis   . Anemia     Past Surgical History  Procedure Date  . Coronary artery bypass graft 2001    x 4 vessels  . Abdominal hysterectomy   . ;   . Rhinosplasty     x 2  . Tonsillectomy   . Knee replacemnt 2000    total right  . Shoulder sx 2000    right bone spurs  . Cardiac catheterization 2013  . Tee without cardioversion 04/01/2012    Procedure: TRANSESOPHAGEAL ECHOCARDIOGRAM (TEE);  Surgeon:  Vesta Mixer, MD;  Location: [facility] ENDOSCOPY;  Service: Cardiovascular;  Laterality: N/A;    Clarene Duke RD, LDN Pager [phone] After Hours pager [phone]

## 2012-04-17 LAB — BASIC METABOLIC PANEL
CO2: 41 mEq/L (ref 19–32)
Calcium: 8.9 mg/dL (ref 8.4–10.5)
GFR calc non Af Amer: 31 mL/min — ABNORMAL LOW (ref >90)
Glucose, Bld: 122 mg/dL — ABNORMAL HIGH (ref 70–99)
Potassium: 3.2 mEq/L — ABNORMAL LOW (ref 3.5–5.1)
Sodium: 136 mEq/L (ref 135–145)

## 2012-04-17 LAB — CBC
Hemoglobin: 8.8 g/dL — ABNORMAL LOW (ref 12.0–15.0)
MCH: 21.3 pg — ABNORMAL LOW (ref 26.0–34.0)
Platelets: 235 10*3/uL (ref 150–400)
RBC: 4.14 MIL/uL (ref 3.87–5.11)

## 2012-04-17 LAB — GLUCOSE, CAPILLARY
Glucose-Capillary: 94 mg/dL (ref 70–99)
Glucose-Capillary: 129 mg/dL — ABNORMAL HIGH (ref 70–99)

## 2012-04-17 MED ORDER — POTASSIUM CHLORIDE 20 MEQ/15ML (10%) PO LIQD
ORAL | Status: AC
  Filled 2012-04-17: qty 30

## 2012-04-17 MED ORDER — POTASSIUM CHLORIDE CRYS ER 20 MEQ PO TBCR
EXTENDED_RELEASE_TABLET | ORAL | Status: AC
  Administered 2012-04-17: 40 meq via ORAL
  Filled 2012-04-17: qty 2

## 2012-04-17 MED ORDER — POTASSIUM CHLORIDE CRYS ER 20 MEQ PO TBCR
40.0000 meq | EXTENDED_RELEASE_TABLET | Freq: Once | ORAL | Status: AC
  Administered 2012-04-17: 40 meq via ORAL

## 2012-04-17 MED ORDER — BIOTENE DRY MOUTH MT LIQD
15.0000 mL | Freq: Two times a day (BID) | OROMUCOSAL | Status: DC
  Administered 2012-04-17 – 2012-04-19 (×3): 15 mL via OROMUCOSAL

## 2012-04-17 MED ORDER — POTASSIUM CHLORIDE 20 MEQ/15ML (10%) PO LIQD
40.0000 meq | Freq: Once | ORAL | Status: DC
  Filled 2012-04-17: qty 30

## 2012-04-17 NOTE — Progress Notes (Signed)
Patient ID: Caitlyn Hardy, female   DOB: 1946/01/25, 67 y.o.   MRN: [id number]   Patient Name: Caitlyn Hardy Date of Encounter: 04/17/2012    SUBJECTIVE  Patient very clear today. She is anxious to get to rehabilitation on Monday. No complaints including chest pain or shortness of breath. Extensive diuresis yesterday.  CURRENT MEDS    . amiodarone  400 mg Oral BID  . antiseptic oral rinse  15 mL Mouth Rinse BID  . atorvastatin  10 mg Oral q1800  . budesonide  0.5 mg Nebulization BID  . dabigatran  75 mg Oral Q12H  . digoxin  0.125 mg Oral QODAY  . feeding supplement  237 mL Oral Q24H  . hydrALAZINE  10 mg Oral Q8H  . insulin aspart  0-15 Units Subcutaneous TID WC  . isosorbide dinitrate  10 mg Oral Q8H  . montelukast  10 mg Oral QHS  . polyethylene glycol  17 g Oral Daily  . rOPINIRole  2 mg Oral QHS  . torsemide  60 mg Oral Daily  . traZODone  150 mg Oral QHS    OBJECTIVE  Filed Vitals:   04/17/12 0800 04/17/12 0821 04/17/12 0830 04/17/12 0900  BP:   89/38   Pulse: 62  63 72  Temp:  97.9 F (36.6 C)    TempSrc:  Oral    Resp:  18 16   Height:      Weight:      SpO2:   96%     Intake/Output Summary (Last 24 hours) at 04/17/12 1012 Last data filed at 04/17/12 0900  Gross per 24 hour  Intake    900 ml  Output   2570 ml  Net  -1670 ml   Filed Weights   04/15/12 0500 04/16/12 0400 04/17/12 0500  Weight: 274 lb 0.5 oz (124.3 kg) 272 lb 11.3 oz (123.7 kg) 268 lb 15.4 oz (122 kg)    PHYSICAL EXAM  General: Pleasant, NAD. Obese Neuro: Alert and oriented X 3. Moves all extremities spontaneously. Psych: Normal affect. HEENT:  Normal  Neck: Supple without bruits or JVD. Lungs:  Resp regular and unlabored, CTA. Heart: RRR no s3, s4, or murmurs. Abdomen: Soft, non-tender, non-distended, BS + x 4.  Extremities: No clubbing, cyanosis , 1+ pitting edema. DP/PT/Radials 2+ and equal bilaterally.  Accessory Clinical Findings  CBC  Basename 04/17/12 0350 04/16/12  0430  WBC 5.2 4.5  NEUTROABS -- --  HGB 8.8* 8.4*  HCT 30.6* 28.8*  MCV 73.9* 72.2*  PLT 235 248   Basic Metabolic Panel  Basename 04/17/12 0350 04/16/12 1534  NA 136 138  K 3.2* 3.5  CL 91* 90*  CO2 41* 39*  GLUCOSE 122* 131*  BUN 31* 37*  CREATININE 1.67* 1.85*  CALCIUM 8.9 8.9  MG -- --  PHOS -- --   Liver Function Tests No results found for this basename: AST:2,ALT:2,ALKPHOS:2,BILITOT:2,PROT:2,ALBUMIN:2 in the last 72 hours No results found for this basename: LIPASE:2,AMYLASE:2 in the last 72 hours Cardiac Enzymes No results found for this basename: CKTOTAL:3,CKMB:3,CKMBINDEX:3,TROPONINI:3 in the last 72 hours BNP No components found with this basename: POCBNP:3 D-Dimer No results found for this basename: DDIMER:2 in the last 72 hours Hemoglobin A1C No results found for this basename: HGBA1C in the last 72 hours Fasting Lipid Panel No results found for this basename: CHOL,HDL,LDLCALC,TRIG,CHOLHDL,LDLDIRECT in the last 72 hours Thyroid Function Tests No results found for this basename: TSH,T4TOTAL,FREET3,T3FREE,THYROIDAB in the last 72 hours  TELE  Normal sinus rhythm ECG  Normal sinus rhythm, normal EKG  Radiology/Studies  Ct Head Wo Contrast  04/14/2012  *RADIOLOGY REPORT*  Clinical Data: Stroke  CT HEAD WITHOUT CONTRAST  Technique:  Contiguous axial images were obtained from the base of the skull through the vertex without contrast.  Comparison: 04/05/2012 and earlier studies  Findings: Atherosclerotic and physiologic intracranial calcifications. The small right cerebellar infarct seen on previous MR is inapparent.  Mild atrophy. There is no evidence of acute intracranial hemorrhage, brain edema, mass lesion, acute infarction,   mass effect, or midline shift. Acute infarct may be inapparent on noncontrast CT.  No other intra-axial abnormalities are seen, and the ventricles and sulci are within normal limits in size and symmetry.   No abnormal extra-axial fluid  collections or masses are identified.  No significant calvarial abnormality.  IMPRESSION: 1. Negative for bleed or other acute intracranial process.   Original Report Authenticated By: D. Andria Rhein, MD    Ct Head Wo Contrast  04/03/2012  *RADIOLOGY REPORT*  Clinical Data: Dysphasia.  Left-sided weakness.  Nausea.  CT HEAD WITHOUT CONTRAST  Technique:  Contiguous axial images were obtained from the base of the skull through the vertex without contrast.  Comparison: None.  Findings: Tiny, linear area of well defined low density in the mid brain on the right.  There is some streak artifact crossing the pons with some associated low density in the anterior pons, greater on the left.  Otherwise, normal appearing cerebral hemispheres and posterior fossa structures.  Normal size and position of the ventricles.  No intracranial hemorrhage, mass lesion or CT evidence of acute infarction.  Left sphenoid sinus mucosal thickening and small air fluid level.  IMPRESSION:  1.  Possible tiny, old right mid brain lacunar infarct. 2.  Streak artifacts crossing the pons, making it difficult to exclude a pontine infarct. 3.  No intracranial hemorrhage or definitive CT findings to indicate acute infarction. 4.  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  These results were called by telephone on 04/03/2012 at 1805 hours to Dr. Thad Ranger, who verbally acknowledged these results.   Original Report Authenticated By: Beckie Salts, M.D.    Mr [facility] [address] Wo Contrast  04/05/2012  *RADIOLOGY REPORT*  Clinical Data:  67 year old female with left side weakness, nausea.  Comparison: Head CT without contrast 04/03/2012.  MRI HEAD WITHOUT CONTRAST  Technique: Multiplanar, multiecho pulse sequences of the brain and surrounding structures were obtained according to standard protocol without intravenous contrast.  Findings: Patchy areas of mild to moderately restricted diffusion in the right cerebellum, primarily the superior  cerebellar artery territory (series 5 images six and seven).  Associated T2 and subtle FLAIR hyperintensity.  No associated mass effect or evidence of acute hemorrhage.  Superimposed chronic left PICA infarct.  Questionable punctate area of restricted diffusion in the upper right pons (series 5 image 10).  No other associated signal abnormality.  The brainstem is otherwise within normal limits.  There is a small focus of encephalomalacia at the left lateral temporal lobe with either chronic hemosiderin or laminar necrosis resulting in mild intrinsic T1 signal.  Supratentorially there are to possible small areas of mild diffusion abnormality, one in the right superior parietal lobe on series 5 image 24, and the second in the right superior frontal gyrus subcortical white matter on image 22.  Mild FLAIR hyperintensity associated with the latter.  No mass effect or hemorrhage associated with these sites.  No other diffusion abnormality.  Major intracranial vascular flow voids  are preserved except for the distal left vertebral artery which might be nondominant.  MRA findings are below.  No ventriculomegaly. No midline shift, mass effect, or evidence of mass lesion.  No acute intracranial hemorrhage identified. Negative pituitary, cervicomedullary junction and visualized cervical spine. Visualized bone marrow signal is within normal limits.  Visualized orbit soft tissues are within normal limits.  Trace mastoid effusions.  Mild paranasal sinus mucosal thickening. Negative scalp soft tissues.  IMPRESSION: 1.  Patchy acute to subacute right cerebellar (SCA) territory infarcts.  No mass effect or hemorrhage. 2.  Questionable acute to subacute punctate right pontine and right MCA territory infarcts. The supra tentorial foci and might represent synchronous small vessel disease.  MRA findings below.  MRA HEAD WITHOUT CONTRAST  Technique: Angiographic images of the Circle of Willis were obtained using MRA technique without   intravenous contrast.  Findings: Antegrade flow in the distal right vertebral artery which appears to be dominant.  Little antegrade flow in the distal left vertebral artery which appears irregular.  The distal left vertebral artery functionally terminates in PICA, and there is flow gap just proximal to the left vertebrobasilar junction (series 804 image 6).  Bilateral PICA flow is evident.  Bilateral AICA origins are patent.  Mild basilar artery irregularity without basilar stenosis.  Both SCA origins are patent, there is proximal right SCA flow identified.  There is a fetal type right PCA origin with little to no right P1 flow.  The left PCA origin is within normal limits.  Bilateral PCA branches are within normal limits.  The left posterior communicating artery also is present.  Antegrade flow in both ICA siphons.  Mild ICA irregularity and no ICA stenosis.  Ophthalmic artery origins are not well delineated. Posterior communicating artery origins are within normal limits. Normal carotid termini, MCA and ACA origins.  Diminutive or absent anterior communicating artery.  Visualized ACA branches are within normal limits.  Visualized right MCA branches are within normal limits.  Mild irregularity of the left MCA M1 segment without significant stenosis.  Visualized left MCA branches are within normal limits.  IMPRESSION:  1.  Decreased flow in the distal left vertebral artery which may be stenotic and/or nondominant.  It functionally terminates in the left PICA which appears remain patent.  2.  Dominant right vertebral artery and basilar artery without stenosis.  Bilateral PICA, AICA, and SCA origins and proximal segments appear patent.  3.  Fetal type right PCA origin.  Remaining posterior circulation and visualized anterior circulation otherwise within normal limits.  Study discussed by telephone with Stroke team provider Annie Main on 04/05/2012 at 1230 hours.   Original Report Authenticated By: Erskine Speed, M.D.     Mr Brain Wo Contrast  04/05/2012  *RADIOLOGY REPORT*  Clinical Data:  67 year old female with left side weakness, nausea.  Comparison: Head CT without contrast 04/03/2012.  MRI HEAD WITHOUT CONTRAST  Technique: Multiplanar, multiecho pulse sequences of the brain and surrounding structures were obtained according to standard protocol without intravenous contrast.  Findings: Patchy areas of mild to moderately restricted diffusion in the right cerebellum, primarily the superior cerebellar artery territory (series 5 images six and seven).  Associated T2 and subtle FLAIR hyperintensity.  No associated mass effect or evidence of acute hemorrhage.  Superimposed chronic left PICA infarct.  Questionable punctate area of restricted diffusion in the upper right pons (series 5 image 10).  No other associated signal abnormality.  The brainstem is otherwise within normal limits.  There is  a small focus of encephalomalacia at the left lateral temporal lobe with either chronic hemosiderin or laminar necrosis resulting in mild intrinsic T1 signal.  Supratentorially there are to possible small areas of mild diffusion abnormality, one in the right superior parietal lobe on series 5 image 24, and the second in the right superior frontal gyrus subcortical white matter on image 22.  Mild FLAIR hyperintensity associated with the latter.  No mass effect or hemorrhage associated with these sites.  No other diffusion abnormality.  Major intracranial vascular flow voids are preserved except for the distal left vertebral artery which might be nondominant.  MRA findings are below.  No ventriculomegaly. No midline shift, mass effect, or evidence of mass lesion.  No acute intracranial hemorrhage identified. Negative pituitary, cervicomedullary junction and visualized cervical spine. Visualized bone marrow signal is within normal limits.  Visualized orbit soft tissues are within normal limits.  Trace mastoid effusions.  Mild paranasal sinus  mucosal thickening. Negative scalp soft tissues.  IMPRESSION: 1.  Patchy acute to subacute right cerebellar (SCA) territory infarcts.  No mass effect or hemorrhage. 2.  Questionable acute to subacute punctate right pontine and right MCA territory infarcts. The supra tentorial foci and might represent synchronous small vessel disease.  MRA findings below.  MRA HEAD WITHOUT CONTRAST  Technique: Angiographic images of the Circle of Willis were obtained using MRA technique without  intravenous contrast.  Findings: Antegrade flow in the distal right vertebral artery which appears to be dominant.  Little antegrade flow in the distal left vertebral artery which appears irregular.  The distal left vertebral artery functionally terminates in PICA, and there is flow gap just proximal to the left vertebrobasilar junction (series 804 image 6).  Bilateral PICA flow is evident.  Bilateral AICA origins are patent.  Mild basilar artery irregularity without basilar stenosis.  Both SCA origins are patent, there is proximal right SCA flow identified.  There is a fetal type right PCA origin with little to no right P1 flow.  The left PCA origin is within normal limits.  Bilateral PCA branches are within normal limits.  The left posterior communicating artery also is present.  Antegrade flow in both ICA siphons.  Mild ICA irregularity and no ICA stenosis.  Ophthalmic artery origins are not well delineated. Posterior communicating artery origins are within normal limits. Normal carotid termini, MCA and ACA origins.  Diminutive or absent anterior communicating artery.  Visualized ACA branches are within normal limits.  Visualized right MCA branches are within normal limits.  Mild irregularity of the left MCA M1 segment without significant stenosis.  Visualized left MCA branches are within normal limits.  IMPRESSION:  1.  Decreased flow in the distal left vertebral artery which may be stenotic and/or nondominant.  It functionally terminates  in the left PICA which appears remain patent.  2.  Dominant right vertebral artery and basilar artery without stenosis.  Bilateral PICA, AICA, and SCA origins and proximal segments appear patent.  3.  Fetal type right PCA origin.  Remaining posterior circulation and visualized anterior circulation otherwise within normal limits.  Study discussed by telephone with Stroke team provider Annie Main on 04/05/2012 at 1230 hours.   Original Report Authenticated By: Erskine Speed, M.D.    Dg Chest Port 1 View  04/03/2012  *RADIOLOGY REPORT*  Clinical Data: Short of breath and chest tightness  PORTABLE CHEST - 1 VIEW  Comparison: 03/29/2012  Findings: Right PICC tip in the right innominate vein is unchanged.  Cardiac enlargement  with changes of CABG.  No   heart failure. Mild bibasilar atelectasis or scarring.  No significant effusion. Negative for pneumonia.  IMPRESSION: Mild bibasilar atelectasis or scarring.   Original Report Authenticated By: Janeece Riggers, M.D.    Dg Chest Port 1 View  03/29/2012  *RADIOLOGY REPORT*  Clinical Data: Line placement.  PORTABLE CHEST - 1 VIEW  Comparison: 03/28/2012 and 03/26/2012.  Findings: 1007 hours.  Right arm PICC has been placed, the tip projecting over the right paratracheal region consistent with position in the proximal SVC.  The patient remains mildly rotated to the right.  There is stable cardiomegaly status post CABG. Overall lung volumes have improved.  There is no pneumothorax or significant pleural effusion.  IMPRESSION:  1.  Right arm PICC projects to the upper SVC level. 2.  No pneumothorax identified.  Basilar aeration has improved.   Original Report Authenticated By: Carey Bullocks, M.D.    Dg Chest Port 1 View  03/28/2012  *RADIOLOGY REPORT*  Clinical Data: Hypoxia.  PORTABLE CHEST - 1 VIEW  Comparison: Chest radiograph performed 03/26/2012  Findings: The lungs are hypoexpanded.  Patchy bilateral airspace opacities raise concern for pneumonia.  No definite  pleural effusion or pneumothorax is seen.  The cardiomediastinal silhouette is enlarged; the patient is status post median sternotomy, with evidence of prior CABG.  No acute osseous abnormalities are seen.  IMPRESSION:  1.  Lungs hypoexpanded.  Patchy bilateral airspace opacities raise concern for pneumonia. 2.  Cardiomegaly.  These results were called by telephone on 03/28/2012 at 02:01 a.m. to [facility] on [id number], who verbally acknowledged these results.   Original Report Authenticated By: Tonia Ghent, M.D.    Portable Chest 1 View  03/26/2012  *RADIOLOGY REPORT*  Clinical Data: CHF  PORTABLE CHEST - 1 VIEW  Comparison: None.  Findings: Previous CABG.  Mild cardiomegaly.  Relatively low lung volumes with resultant crowding of perihilar bronchovascular structures.  Cannot exclude mild central pulmonary vascular congestion.  No effusion.  IMPRESSION:  1.  Cardiomegaly with question of mild central pulmonary vascular congestion.   Original Report Authenticated By: D. Andria Rhein, MD     ASSESSMENT AND PLAN  Principal Problem:  *Atrial fibrillation with RVR Active Problems:  CVA (cerebral infarction)  CAD (coronary artery disease)  Renal failure  Acute on chronic systolic congestive heart failure, NYHA class 4  Anemia  NSTEMI (non-ST elevated myocardial infarction)   She has improved significantly. She is ready for rehabilitation to skilled nursing facility on Monday. BUN and creatinine have improved. Good diuresis yesterday. We'll supplement potassium. No other changes in medications.  Signed, Valera Castle MD

## 2012-04-17 NOTE — Progress Notes (Deleted)
CRITICAL VALUE ALERT  Critical value received:  CO2 41  Date of notification:  04/17/2012  Time of notification:  4:35 AM  Critical value read back:yes  Nurse who received alert:  Gregor Hams  MD notified (1st page):  [facility] Card[facility], Dr. Lurline Idol  Time of first page:  0440  MD notified (2nd page):  Time of second page:  Responding MD:  Dr. Lurline Idol  Time MD responded:  4:47 AM

## 2012-04-18 ENCOUNTER — Encounter (HOSPITAL_COMMUNITY): Payer: Self-pay

## 2012-04-18 LAB — CBC
HCT: 30.8 % — ABNORMAL LOW (ref 36.0–46.0)
MCHC: 28.9 g/dL — ABNORMAL LOW (ref 30.0–36.0)
MCV: 74 fL — ABNORMAL LOW (ref 78.0–100.0)
Platelets: 231 10*3/uL (ref 150–400)
RDW: 19.3 % — ABNORMAL HIGH (ref 11.5–15.5)

## 2012-04-18 LAB — BASIC METABOLIC PANEL
BUN: 26 mg/dL — ABNORMAL HIGH (ref 6–23)
Creatinine, Ser: 1.46 mg/dL — ABNORMAL HIGH (ref 0.50–1.10)
GFR calc Af Amer: 42 mL/min — ABNORMAL LOW (ref >90)
GFR calc non Af Amer: 36 mL/min — ABNORMAL LOW (ref >90)

## 2012-04-18 LAB — GLUCOSE, CAPILLARY
Glucose-Capillary: 205 mg/dL — ABNORMAL HIGH (ref 70–99)
Glucose-Capillary: 136 mg/dL — ABNORMAL HIGH (ref 70–99)

## 2012-04-18 MED ORDER — POTASSIUM CHLORIDE CRYS ER 20 MEQ PO TBCR
40.0000 meq | EXTENDED_RELEASE_TABLET | Freq: Three times a day (TID) | ORAL | Status: DC
  Administered 2012-04-18 – 2012-04-19 (×5): 40 meq via ORAL
  Filled 2012-04-18 (×5): qty 2

## 2012-04-18 MED ORDER — POTASSIUM CHLORIDE CRYS ER 20 MEQ PO TBCR
EXTENDED_RELEASE_TABLET | ORAL | Status: AC
  Administered 2012-04-18: 40 meq
  Filled 2012-04-18: qty 2

## 2012-04-18 NOTE — Progress Notes (Signed)
Patient ID: Caitlyn Hardy, female   DOB: [birth year], 67 y.o.   MRN: [id number]   Patient Name: Caitlyn Hardy Date of Encounter: 04/18/2012    SUBJECTIVE  Sitting in chair and has been walking with assistance. She denies any shortness of breath or chest pain. Legs are less swollen.  CURRENT MEDS    . amiodarone  400 mg Oral BID  . antiseptic oral rinse  15 mL Mouth Rinse BID  . atorvastatin  10 mg Oral q1800  . budesonide  0.5 mg Nebulization BID  . dabigatran  75 mg Oral Q12H  . digoxin  0.125 mg Oral QODAY  . feeding supplement  237 mL Oral Q24H  . hydrALAZINE  10 mg Oral Q8H  . insulin aspart  0-15 Units Subcutaneous TID WC  . isosorbide dinitrate  10 mg Oral Q8H  . montelukast  10 mg Oral QHS  . polyethylene glycol  17 g Oral Daily  . rOPINIRole  2 mg Oral QHS  . torsemide  60 mg Oral Daily  . traZODone  150 mg Oral QHS    OBJECTIVE  Filed Vitals:   04/18/12 0800 04/18/12 0806 04/18/12 0825 04/18/12 0900  BP:  114/46    Pulse: 66   87  Temp:  97.9 F (36.6 C)    TempSrc:  Oral    Resp:  20    Height:      Weight:      SpO2: 98%  100% 98%    Intake/Output Summary (Last 24 hours) at 04/18/12 1111 Last data filed at 04/18/12 0900  Gross per 24 hour  Intake   1310 ml  Output    900 ml  Net    410 ml   Filed Weights   04/16/12 0400 04/17/12 0500 04/18/12 0454  Weight: 272 lb 11.3 oz (123.7 kg) 268 lb 15.4 oz (122 kg) 268 lb 4.8 oz (121.7 kg)    PHYSICAL EXAM  General: Pleasant, NAD. Neuro: Alert and oriented X 3. Moves all extremities spontaneously. Psych: Normal affect. HEENT:  Normal  Neck: Supple without bruits or JVD. Lungs:  Resp regular and unlabored, CTA. Heart: RRR no s3, s4, or murmurs. Abdomen: Soft, non-tender, non-distended, BS + x 4.  Extremities: No clubbing, cyanosis. 1+ edema but improved. DP/PT/Radials 2+ and equal bilaterally.  Accessory Clinical Findings  CBC  Basename 04/18/12 0450 04/17/12 0350  WBC 4.1 5.2  NEUTROABS -- --    HGB 8.9* 8.8*  HCT 30.8* 30.6*  MCV 74.0* 73.9*  PLT 231 235   Basic Metabolic Panel  Basename 04/18/12 0450 04/17/12 0350  NA 136 136  K 2.9* 3.2*  CL 90* 91*  CO2 39* 41*  GLUCOSE 117* 122*  BUN 26* 31*  CREATININE 1.46* 1.67*  CALCIUM 9.1 8.9  MG -- --  PHOS -- --   Liver Function Tests No results found for this basename: AST:2,ALT:2,ALKPHOS:2,BILITOT:2,PROT:2,ALBUMIN:2 in the last 72 hours No results found for this basename: LIPASE:2,AMYLASE:2 in the last 72 hours Cardiac Enzymes No results found for this basename: CKTOTAL:3,CKMB:3,CKMBINDEX:3,TROPONINI:3 in the last 72 hours BNP No components found with this basename: POCBNP:3 D-Dimer No results found for this basename: DDIMER:2 in the last 72 hours Hemoglobin A1C No results found for this basename: HGBA1C in the last 72 hours Fasting Lipid Panel No results found for this basename: CHOL,HDL,LDLCALC,TRIG,CHOLHDL,LDLDIRECT in the last 72 hours Thyroid Function Tests No results found for this basename: TSH,T4TOTAL,FREET3,T3FREE,THYROIDAB in the last 72 hours  TELE  Normal sinus rhythm  ECG  Normal sinus rhythm, no acute changes.  Radiology/Studies  Ct Head Wo Contrast  04/14/2012  *RADIOLOGY REPORT*  Clinical Data: Stroke  CT HEAD WITHOUT CONTRAST  Technique:  Contiguous axial images were obtained from the base of the skull through the vertex without contrast.  Comparison: 04/05/2012 and earlier studies  Findings: Atherosclerotic and physiologic intracranial calcifications. The small right cerebellar infarct seen on previous MR is inapparent.  Mild atrophy. There is no evidence of acute intracranial hemorrhage, brain edema, mass lesion, acute infarction,   mass effect, or midline shift. Acute infarct may be inapparent on noncontrast CT.  No other intra-axial abnormalities are seen, and the ventricles and sulci are within normal limits in size and symmetry.   No abnormal extra-axial fluid collections or masses are  identified.  No significant calvarial abnormality.  IMPRESSION: 1. Negative for bleed or other acute intracranial process.   Original Report Authenticated By: D. Andria Rhein, MD    Ct Head Wo Contrast  04/03/2012  *RADIOLOGY REPORT*  Clinical Data: Dysphasia.  Left-sided weakness.  Nausea.  CT HEAD WITHOUT CONTRAST  Technique:  Contiguous axial images were obtained from the base of the skull through the vertex without contrast.  Comparison: None.  Findings: Tiny, linear area of well defined low density in the mid brain on the right.  There is some streak artifact crossing the pons with some associated low density in the anterior pons, greater on the left.  Otherwise, normal appearing cerebral hemispheres and posterior fossa structures.  Normal size and position of the ventricles.  No intracranial hemorrhage, mass lesion or CT evidence of acute infarction.  Left sphenoid sinus mucosal thickening and small air fluid level.  IMPRESSION:  1.  Possible tiny, old right mid brain lacunar infarct. 2.  Streak artifacts crossing the pons, making it difficult to exclude a pontine infarct. 3.  No intracranial hemorrhage or definitive CT findings to indicate acute infarction. 4.  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  These results were called by telephone on 04/03/2012 at 1805 hours to Dr. Thad Ranger, who verbally acknowledged these results.   Original Report Authenticated By: Beckie Salts, M.D.    Mr [facility] Wo Contrast  04/05/2012  *RADIOLOGY REPORT*  Clinical Data:  67 year old female with left side weakness, nausea.  Comparison: Head CT without contrast 04/03/2012.  MRI HEAD WITHOUT CONTRAST  Technique: Multiplanar, multiecho pulse sequences of the brain and surrounding structures were obtained according to standard protocol without intravenous contrast.  Findings: Patchy areas of mild to moderately restricted diffusion in the right cerebellum, primarily the superior cerebellar artery territory  (series 5 images six and seven).  Associated T2 and subtle FLAIR hyperintensity.  No associated mass effect or evidence of acute hemorrhage.  Superimposed chronic left PICA infarct.  Questionable punctate area of restricted diffusion in the upper right pons (series 5 image 10).  No other associated signal abnormality.  The brainstem is otherwise within normal limits.  There is a small focus of encephalomalacia at the left lateral temporal lobe with either chronic hemosiderin or laminar necrosis resulting in mild intrinsic T1 signal.  Supratentorially there are to possible small areas of mild diffusion abnormality, one in the right superior parietal lobe on series 5 image 24, and the second in the right superior frontal gyrus subcortical white matter on image 22.  Mild FLAIR hyperintensity associated with the latter.  No mass effect or hemorrhage associated with these sites.  No other diffusion abnormality.  Major intracranial vascular flow  voids are preserved except for the distal left vertebral artery which might be nondominant.  MRA findings are below.  No ventriculomegaly. No midline shift, mass effect, or evidence of mass lesion.  No acute intracranial hemorrhage identified. Negative pituitary, cervicomedullary junction and visualized cervical spine. Visualized bone marrow signal is within normal limits.  Visualized orbit soft tissues are within normal limits.  Trace mastoid effusions.  Mild paranasal sinus mucosal thickening. Negative scalp soft tissues.  IMPRESSION: 1.  Patchy acute to subacute right cerebellar (SCA) territory infarcts.  No mass effect or hemorrhage. 2.  Questionable acute to subacute punctate right pontine and right MCA territory infarcts. The supra tentorial foci and might represent synchronous small vessel disease.  MRA findings below.  MRA HEAD WITHOUT CONTRAST  Technique: Angiographic images of the Circle of Willis were obtained using MRA technique without  intravenous contrast.   Findings: Antegrade flow in the distal right vertebral artery which appears to be dominant.  Little antegrade flow in the distal left vertebral artery which appears irregular.  The distal left vertebral artery functionally terminates in PICA, and there is flow gap just proximal to the left vertebrobasilar junction (series 804 image 6).  Bilateral PICA flow is evident.  Bilateral AICA origins are patent.  Mild basilar artery irregularity without basilar stenosis.  Both SCA origins are patent, there is proximal right SCA flow identified.  There is a fetal type right PCA origin with little to no right P1 flow.  The left PCA origin is within normal limits.  Bilateral PCA branches are within normal limits.  The left posterior communicating artery also is present.  Antegrade flow in both ICA siphons.  Mild ICA irregularity and no ICA stenosis.  Ophthalmic artery origins are not well delineated. Posterior communicating artery origins are within normal limits. Normal carotid termini, MCA and ACA origins.  Diminutive or absent anterior communicating artery.  Visualized ACA branches are within normal limits.  Visualized right MCA branches are within normal limits.  Mild irregularity of the left MCA M1 segment without significant stenosis.  Visualized left MCA branches are within normal limits.  IMPRESSION:  1.  Decreased flow in the distal left vertebral artery which may be stenotic and/or nondominant.  It functionally terminates in the left PICA which appears remain patent.  2.  Dominant right vertebral artery and basilar artery without stenosis.  Bilateral PICA, AICA, and SCA origins and proximal segments appear patent.  3.  Fetal type right PCA origin.  Remaining posterior circulation and visualized anterior circulation otherwise within normal limits.  Study discussed by telephone with Stroke team provider Annie Main on 04/05/2012 at 1230 hours.   Original Report Authenticated By: Erskine Speed, M.D.    Mr Brain Wo  Contrast  04/05/2012  *RADIOLOGY REPORT*  Clinical Data:  67 year old female with left side weakness, nausea.  Comparison: Head CT without contrast 04/03/2012.  MRI HEAD WITHOUT CONTRAST  Technique: Multiplanar, multiecho pulse sequences of the brain and surrounding structures were obtained according to standard protocol without intravenous contrast.  Findings: Patchy areas of mild to moderately restricted diffusion in the right cerebellum, primarily the superior cerebellar artery territory (series 5 images six and seven).  Associated T2 and subtle FLAIR hyperintensity.  No associated mass effect or evidence of acute hemorrhage.  Superimposed chronic left PICA infarct.  Questionable punctate area of restricted diffusion in the upper right pons (series 5 image 10).  No other associated signal abnormality.  The brainstem is otherwise within normal limits.  There  is a small focus of encephalomalacia at the left lateral temporal lobe with either chronic hemosiderin or laminar necrosis resulting in mild intrinsic T1 signal.  Supratentorially there are to possible small areas of mild diffusion abnormality, one in the right superior parietal lobe on series 5 image 24, and the second in the right superior frontal gyrus subcortical white matter on image 22.  Mild FLAIR hyperintensity associated with the latter.  No mass effect or hemorrhage associated with these sites.  No other diffusion abnormality.  Major intracranial vascular flow voids are preserved except for the distal left vertebral artery which might be nondominant.  MRA findings are below.  No ventriculomegaly. No midline shift, mass effect, or evidence of mass lesion.  No acute intracranial hemorrhage identified. Negative pituitary, cervicomedullary junction and visualized cervical spine. Visualized bone marrow signal is within normal limits.  Visualized orbit soft tissues are within normal limits.  Trace mastoid effusions.  Mild paranasal sinus mucosal  thickening. Negative scalp soft tissues.  IMPRESSION: 1.  Patchy acute to subacute right cerebellar (SCA) territory infarcts.  No mass effect or hemorrhage. 2.  Questionable acute to subacute punctate right pontine and right MCA territory infarcts. The supra tentorial foci and might represent synchronous small vessel disease.  MRA findings below.  MRA HEAD WITHOUT CONTRAST  Technique: Angiographic images of the Circle of Willis were obtained using MRA technique without  intravenous contrast.  Findings: Antegrade flow in the distal right vertebral artery which appears to be dominant.  Little antegrade flow in the distal left vertebral artery which appears irregular.  The distal left vertebral artery functionally terminates in PICA, and there is flow gap just proximal to the left vertebrobasilar junction (series 804 image 6).  Bilateral PICA flow is evident.  Bilateral AICA origins are patent.  Mild basilar artery irregularity without basilar stenosis.  Both SCA origins are patent, there is proximal right SCA flow identified.  There is a fetal type right PCA origin with little to no right P1 flow.  The left PCA origin is within normal limits.  Bilateral PCA branches are within normal limits.  The left posterior communicating artery also is present.  Antegrade flow in both ICA siphons.  Mild ICA irregularity and no ICA stenosis.  Ophthalmic artery origins are not well delineated. Posterior communicating artery origins are within normal limits. Normal carotid termini, MCA and ACA origins.  Diminutive or absent anterior communicating artery.  Visualized ACA branches are within normal limits.  Visualized right MCA branches are within normal limits.  Mild irregularity of the left MCA M1 segment without significant stenosis.  Visualized left MCA branches are within normal limits.  IMPRESSION:  1.  Decreased flow in the distal left vertebral artery which may be stenotic and/or nondominant.  It functionally terminates in the  left PICA which appears remain patent.  2.  Dominant right vertebral artery and basilar artery without stenosis.  Bilateral PICA, AICA, and SCA origins and proximal segments appear patent.  3.  Fetal type right PCA origin.  Remaining posterior circulation and visualized anterior circulation otherwise within normal limits.  Study discussed by telephone with Stroke team provider Annie Main on 04/05/2012 at 1230 hours.   Original Report Authenticated By: Erskine Speed, M.D.    Dg Chest Port 1 View  04/03/2012  *RADIOLOGY REPORT*  Clinical Data: Short of breath and chest tightness  PORTABLE CHEST - 1 VIEW  Comparison: 03/29/2012  Findings: Right PICC tip in the right innominate vein is unchanged.  Cardiac  enlargement with changes of CABG.  No   heart failure. Mild bibasilar atelectasis or scarring.  No significant effusion. Negative for pneumonia.  IMPRESSION: Mild bibasilar atelectasis or scarring.   Original Report Authenticated By: Janeece Riggers, M.D.    Dg Chest Port 1 View  03/29/2012  *RADIOLOGY REPORT*  Clinical Data: Line placement.  PORTABLE CHEST - 1 VIEW  Comparison: 03/28/2012 and 03/26/2012.  Findings: 1007 hours.  Right arm PICC has been placed, the tip projecting over the right paratracheal region consistent with position in the proximal SVC.  The patient remains mildly rotated to the right.  There is stable cardiomegaly status post CABG. Overall lung volumes have improved.  There is no pneumothorax or significant pleural effusion.  IMPRESSION:  1.  Right arm PICC projects to the upper SVC level. 2.  No pneumothorax identified.  Basilar aeration has improved.   Original Report Authenticated By: Carey Bullocks, M.D.    Dg Chest Port 1 View  03/28/2012  *RADIOLOGY REPORT*  Clinical Data: Hypoxia.  PORTABLE CHEST - 1 VIEW  Comparison: Chest radiograph performed 03/26/2012  Findings: The lungs are hypoexpanded.  Patchy bilateral airspace opacities raise concern for pneumonia.  No definite pleural  effusion or pneumothorax is seen.  The cardiomediastinal silhouette is enlarged; the patient is status post median sternotomy, with evidence of prior CABG.  No acute osseous abnormalities are seen.  IMPRESSION:  1.  Lungs hypoexpanded.  Patchy bilateral airspace opacities raise concern for pneumonia. 2.  Cardiomegaly.  These results were called by telephone on 03/28/2012 at 02:01 a.m. to [facility] on [phone], who verbally acknowledged these results.   Original Report Authenticated By: Tonia Ghent, M.D.    Portable Chest 1 View  03/26/2012  *RADIOLOGY REPORT*  Clinical Data: CHF  PORTABLE CHEST - 1 VIEW  Comparison: None.  Findings: Previous CABG.  Mild cardiomegaly.  Relatively low lung volumes with resultant crowding of perihilar bronchovascular structures.  Cannot exclude mild central pulmonary vascular congestion.  No effusion.  IMPRESSION:  1.  Cardiomegaly with question of mild central pulmonary vascular congestion.   Original Report Authenticated By: D. Andria Rhein, MD     ASSESSMENT AND PLAN  Principal Problem:  *Atrial fibrillation with RVR Active Problems:  CVA (cerebral infarction)  CAD (coronary artery disease)  Renal failure  Acute on chronic systolic congestive heart failure, NYHA class 4  Anemia  NSTEMI (non-ST elevated myocardial infarction)   She is doing remarkably well. Strength improving. Ambulating with assistance.  Her potassium is very low at 2.9. Weight is stable so we'll not change her diuretics today. She should be ready to be discharged to rehabilitation tomorrow.  Signed, Valera Castle MD

## 2012-04-19 ENCOUNTER — Encounter (HOSPITAL_COMMUNITY): Payer: Self-pay | Admitting: Physician Assistant

## 2012-04-19 LAB — BASIC METABOLIC PANEL
Calcium: 9.1 mg/dL (ref 8.4–10.5)
Creatinine, Ser: 1.46 mg/dL — ABNORMAL HIGH (ref 0.50–1.10)
GFR calc non Af Amer: 36 mL/min — ABNORMAL LOW (ref >90)
Glucose, Bld: 131 mg/dL — ABNORMAL HIGH (ref 70–99)
Sodium: 132 mEq/L — ABNORMAL LOW (ref 135–145)

## 2012-04-19 MED ORDER — AMIODARONE HCL 200 MG PO TABS: 400.0000 mg | ORAL_TABLET | Freq: Every day | ORAL | Status: DC

## 2012-04-19 MED ORDER — POTASSIUM CHLORIDE CRYS ER 20 MEQ PO TBCR: 40.0000 meq | EXTENDED_RELEASE_TABLET | Freq: Two times a day (BID) | ORAL | Status: DC

## 2012-04-19 MED ORDER — ISOSORBIDE DINITRATE 10 MG PO TABS: 10.0000 mg | ORAL_TABLET | Freq: Three times a day (TID) | ORAL | Status: DC

## 2012-04-19 MED ORDER — CARVEDILOL 3.125 MG PO TABS: 3.1250 mg | ORAL_TABLET | Freq: Two times a day (BID) | ORAL | Status: DC

## 2012-04-19 MED ORDER — ATORVASTATIN CALCIUM 10 MG PO TABS: 10.0000 mg | ORAL_TABLET | Freq: Every day | ORAL | Status: DC

## 2012-04-19 MED ORDER — INSULIN ASPART 100 UNIT/ML ~~LOC~~ SOLN: SUBCUTANEOUS | Status: DC

## 2012-04-19 MED ORDER — DIGOXIN 125 MCG PO TABS: 0.1250 mg | ORAL_TABLET | ORAL | Status: DC

## 2012-04-19 MED ORDER — MONTELUKAST SODIUM 10 MG PO TABS: 10.0000 mg | ORAL_TABLET | Freq: Every day | ORAL | Status: DC

## 2012-04-19 MED ORDER — DABIGATRAN ETEXILATE MESYLATE 75 MG PO CAPS: 75.0000 mg | ORAL_CAPSULE | Freq: Two times a day (BID) | ORAL | Status: DC

## 2012-04-19 MED ORDER — HYDRALAZINE HCL 10 MG PO TABS: 10.0000 mg | ORAL_TABLET | Freq: Three times a day (TID) | ORAL | Status: DC

## 2012-04-19 MED ORDER — TORSEMIDE 20 MG PO TABS: 60.0000 mg | ORAL_TABLET | Freq: Every day | ORAL | Status: DC

## 2012-04-19 MED ORDER — ROPINIROLE HCL 1 MG PO TABS: 2.0000 mg | ORAL_TABLET | Freq: Every day | ORAL | Status: DC

## 2012-04-19 MED ORDER — CARVEDILOL 3.125 MG PO TABS
3.1250 mg | ORAL_TABLET | Freq: Two times a day (BID) | ORAL | Status: DC
  Administered 2012-04-19: 3.125 mg via ORAL
  Filled 2012-04-19 (×3): qty 1

## 2012-04-19 MED ORDER — TRAZODONE HCL 150 MG PO TABS: 150.0000 mg | ORAL_TABLET | Freq: Every day | ORAL | Status: DC

## 2012-04-19 NOTE — Progress Notes (Signed)
Occupational Therapy Treatment Patient Details Name: Carel Schnee MRN: [id number] DOB: [birth year] Today's Date: 04/19/2012 Time: 4782-9562 OT Time Calculation (min): 24 min  OT Assessment / Plan / Recommendation Comments on Treatment Session Pt making good progress. Reminded pt to call staff when needing to go to the bathroom. Discussed with nsg who states they also remind pt to call for assistance. Anticipate D/C to SNF this pm. Pt will benefit from continued OT sevices at SNF to facilitate goal of returning home to live independently.    Follow Up Recommendations  SNF    Barriers to Discharge   none    Equipment Recommendations  None recommended by OT    Recommendations for Other Services  none  Frequency Min 2X/week   Plan Discharge plan remains appropriate    Precautions / Restrictions Precautions Precautions: Fall Precaution Comments: contact with shingles wounds left buttock Restrictions Other Position/Activity Restrictions: reports Lt knee buckles without warning   Pertinent Vitals/Pain No c/o pain. HR @100  with ambulation. 90s at rest. O2 sats 98 1L    ADL  Grooming: Set up Lower Body Dressing: Set up Where Assessed - Lower Body Dressing: Unsupported sitting Toilet Transfer: Supervision/safety Toilet Transfer Method: Sit to stand Toilet Transfer Equipment: Comfort height toilet Toileting - Clothing Manipulation and Hygiene: Modified independent Where Assessed - Toileting Clothing Manipulation and Hygiene: Sit to stand from 3-in-1 or toilet Equipment Used: Gait belt;Rolling walker Transfers/Ambulation Related to ADLs: S.min guard with ambulation due to L knee "giving out"at times (vc to remind pt to stay inside walker when ambulating) ADL Comments: Discussed E conservation with pt and available AE. Educated pt on need for her to address concerns regarding ADL, E conservation with IADL tasks, etc with therapists at SNF.     OT Diagnosis:    OT Problem List:   OT  Treatment Interventions:     OT Goals Acute Rehab OT Goals OT Goal Formulation: With patient Time For Goal Achievement: 04/20/12 Potential to Achieve Goals: Good ADL Goals Pt Will Perform Grooming: with supervision;Standing at sink;Unsupported ADL Goal: Grooming - Progress: Met Pt Will Perform Lower Body Bathing: with supervision;with set-up;Sit to stand from chair;Unsupported;with adaptive equipment ADL Goal: Lower Body Bathing - Progress: Met Pt Will Transfer to Toilet: with supervision;Ambulation;with DME;3-in-1 ADL Goal: Toilet Transfer - Progress: Met Additional ADL Goal #1: Pt will verbalize 2 E conservation techniques to use during ADL task ADL Goal: Additional Goal #1 - Progress: Progressing toward goals  Visit Information  Last OT Received On: 04/19/12 Assistance Needed: +1 PT/OT Co-Evaluation/Treatment: Yes    Subjective Data   I feel so much better.   Prior Functioning    independent   Cognition  Overall Cognitive Status: Appears within functional limits for tasks assessed/performed Arousal/Alertness: Awake/alert Orientation Level: Appears intact for tasks assessed Behavior During Session: [facility] for tasks performed Cognition - Other Comments: Has been reminded to call staff  for S during toileting in order to prevent falls    Mobility  Shoulder Instructions Bed Mobility Bed Mobility: Supine to Sit;Sitting - Scoot to Edge of Bed Supine to Sit: 5: Supervision;HOB flat;With rails Sitting - Scoot to Edge of Bed: 6: Modified independent (Device/Increase time) Details for Bed Mobility Assistance: incr time/effort Transfers Transfers: Sit to Stand;Stand to Sit Sit to Stand: 4: Min guard;With upper extremity assist;From bed Stand to Sit: 5: Supervision;With upper extremity assist;With armrests;To chair/3-in-1 Details for Transfer Assistance: minguard due to prefers one hand on RW, one hand on surface as coming to stand (  RW did not tip)       Exercises   Pt  completing exercises in room independently   Balance Balance Balance Assessed: Yes Static Sitting Balance Static Sitting - Balance Support: No upper extremity supported;Feet supported Static Sitting - Level of Assistance: 7: Independent Dynamic Sitting Balance Dynamic Sitting - Balance Support: No upper extremity supported;Feet supported Dynamic Sitting - Level of Assistance: 7: Independent Static Standing Balance Static Standing - Balance Support: Bilateral upper extremity supported Static Standing - Level of Assistance: 5: Stand by assistance Static Standing - Comment/# of Minutes: supervision for safety due to reports of knee buckling   End of Session OT - End of Session Equipment Utilized During Treatment: Gait belt Activity Tolerance: Patient tolerated treatment well Patient left: in chair;with call bell/phone within reach Nurse Communication: Mobility status  GO     Ola Raap,HILLARY 04/19/2012, 9:56 AM Luisa Dago, OTR/L  [phone] 04/19/2012

## 2012-04-19 NOTE — Progress Notes (Signed)
Clinical Social Worker received notification that pt is ready for dc to [facility] and will require transportation. CSW provided dc packet, no dc summary yet.  MD, please sign FL2 prior to pt's dc.   Angelia Mould, MSW, [place] [phone]

## 2012-04-19 NOTE — Progress Notes (Signed)
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 Underwent emergent cardioversion with HR now stable. Activity tolerance much improved. Continues to require PT for safety with DME and to regain independence prior to d/c home alone.    Follow Up Recommendations  SNF                 Equipment Recommendations  None recommended by PT        Frequency Min 3X/week   Plan Discharge plan remains appropriate;Frequency remains appropriate    Precautions / Restrictions Precautions Precautions: Fall Precaution Comments: contact with shingles wounds left buttock Restrictions Other Position/Activity Restrictions: reports Lt knee buckles without warning   Pertinent Vitals/Pain HR 87-108 SaO2 97% on 1L Reports Lt buttock pain is better    Mobility  Bed Mobility Bed Mobility: Supine to Sit;Sitting - Scoot to Edge of Bed Supine to Sit: 5: Supervision;HOB flat;With rails Sitting - Scoot to Edge of Bed: 6: Modified independent (Device/Increase time) Details for Bed Mobility Assistance: incr time/effort Transfers Transfers: Sit to Stand;Stand to Sit Sit to Stand: 4: Min guard;With upper extremity assist;From bed Stand to Sit: 5: Supervision;With upper extremity assist;With armrests;To chair/3-in-1 Details for Transfer Assistance: minguard due to prefers one hand on RW, one hand on surface as coming to stand (RW did not tip) Ambulation/Gait Ambulation/Gait Assistance: 4: Min guard Ambulation Distance (Feet): 70 Feet Assistive device: Rolling walker Ambulation/Gait Assistance Details: minguard due to pt tends to push RW too far ahead and reports Lt knee buckles without warning with +falls  Gait Pattern: Step-through pattern;Decreased  stride length;Lateral trunk lean to right;Lateral trunk lean to left;Wide base of support Gait velocity: decreased    Exercises  Pt reports she had already done exercises this a.m.   PT Goals Acute Rehab PT Goals Time For Goal Achievement: 04/19/12 (to d/c to SNF today, therefore goals not updated) Pt will go Supine/Side to Sit: with modified independence PT Goal: Supine/Side to Sit - Progress: Met Pt will go Sit to Stand: with modified independence PT Goal: Sit to Stand - Progress: Progressing toward goal Pt will go Stand to Sit: with modified independence PT Goal: Stand to Sit - Progress: Progressing toward goal Pt will Transfer Bed to Chair/Chair to Bed: with modified independence PT Transfer Goal: Bed to Chair/Chair to Bed - Progress: Progressing toward goal Pt will Ambulate: 51 - 150 feet;with supervision;with least restrictive assistive device PT Goal: Ambulate - Progress: Progressing toward goal  Visit Information  Last PT Received On: 04/19/12 Assistance Needed: +1    Subjective Data  Subjective: I've been getting up and going to the bathroom on my own (states she goes without O2 and unplugs SaO2 monitor) Patient Stated Goal: ultimately return home alone   Cognition  Overall Cognitive Status: Appears within functional limits for tasks assessed/performed Arousal/Alertness: Awake/alert Orientation Level: Appears intact for tasks assessed Behavior During Session: [facility] for tasks performed Cognition - Other Comments: Has been reminded to call staff  for S during toileting in order to prevent falls    Balance  Balance Balance Assessed: Yes Static Sitting Balance Static Sitting - Balance Support: No upper extremity supported;Feet supported Static Sitting - Level of Assistance: 7: Independent Dynamic Sitting Balance Dynamic Sitting - Balance Support: No upper extremity supported;Feet supported Dynamic Sitting -  Level of Assistance: 7: Independent Static Standing  Balance Static Standing - Balance Support: Bilateral upper extremity supported Static Standing - Level of Assistance: 5: Stand by assistance Static Standing - Comment/# of Minutes: supervision for safety due to reports of knee buckling  End of Session PT - End of Session Equipment Utilized During Treatment: Gait belt;Oxygen Activity Tolerance: Patient tolerated treatment well Patient left: in chair;with call bell/phone within reach;with nursing in room Nurse Communication: Mobility status;Other (comment) (pt reports getting up alone; RN requests she does not)   GP     Detron Carras 04/19/2012, 9:57 AM Pager [phone]

## 2012-04-19 NOTE — Discharge Summary (Signed)
Discharge Summary   Patient ID: Caitlyn Hardy MRN: [id number], DOB/AGE: Jul 26, 1945 67 y.o. Admit date: 03/25/2012 D/C date:     04/19/2012  Primary Cardiologist: Shirlee Latch ([place])  Primary Discharge Diagnoses:  1. 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 Stroke - recent cerebellar stroke in [place] prior to transfer 02/2012 - acute CVA 04/04/11 felt embolic in setting of LAA thrombus 3. Atrial fibrillation, diagnosed this admission - possible small LAA thrombus this admission by TEE 04/01/12 - plan was for OP cardioversion, but pt became unstable 04/14/12 in setting of VT/rapid afib requiring synced DCCV - mildly elevated 2nd TSH - will repeat as OP given amiodarone  4. 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 CKD with AKI felt due to cardiorenal syndrome, hypotension this admission 6. Morbid obesity 7. CAD - s/p CABG 2001, PCI 2011 - NSTEMI this admission felt 2/2 demand ischemia with patent grafts by cath 8. Acute on chronic respiratory failure - used 2L/min continuous home O2 prior to admit 9. Ventricular tachycardia - frequent runs of NSVT - rapid afib/VT with hypotension requiring emergent DCCV 04/14/12 10. Anemia/thrombocytopenia 11. COPD 12. Sleep apnea, on CPAP 13. Diabetes mellitus 14. HTN  Secondary Discharge Diagnoses:  1. Chronic pain 2. Restless leg syndrome  Hospital Course: Caitlyn Hardy is a 67 y/o F with history of CAD s/p CABG 2001 & PCI 2011, DM, HTN, ICM EF 40-->20% who was admitted for a prolonged 26-day hospital stay at [facility] complicated by stroke with recurrent stroke in-house, new atrial fib with LAA thrombus on TEE and difficult to control rates (eventually had emergent DCCV in setting of VT/rapid afib/unresponsive/hypotensive), systolic CHF  requiring inotropes/diuresis, NSTEMI felt demand ischemia with cath this admission, and fluctuating renal insufficiency.  She was initially to [facility] 03/20/12 with altered mental status and shortness of breath who was found to have an acute R cerebellar stroke, new EF drop to 20%, and new atrial fibrillation. Prior to admission at [facility], she had had some chest heaviness as well that she had been taking NTG for. She also had worsening LEE. Her Cr was somewhat elevated this admission with unclear baseline. Her troponin was mildly elevated at the outside hospital. She was transferred to [facility] on 03/25/12 for further management. Her Coumadin was held with transition to IV heparin in preparation for heart catheterization. She was also loaded with digoxin as her afib was poorly rate controlled on arrival. Metoprolol was increased. 2D echo here demonstrated EF 25% with diffuse hypokinesis, septal flattening, aortic sclerosis without stenosis, mild MR, PA pressure .  She had previously been on lasix prior to arrival but this was held in the setting of worsening kidney function - she appeared fairly compensated during the initial days of her hospital stay.   On 03/28/12 she was noted to be hypoxic on [place] with ABG on facemask O2 showing pH 7.372, PCO2 31.6, PO2 216, bicarbonate 17.9. She also had transient chest pain. CXR was questionable for pneumonia and thus antibiotics were started. Troponin was elevated to 3.97 without EKG changes. She was continued on parenteral anticoagulation while her INR trended down. She underwent R&L heart cath on 03/30/12 which demonstrated: 1. severe native three-vessel coronary artery disease  2. Continued patency of the saphenous vein graft to PDA, saphenous vein graft to diagonal, and LIMA to  LAD  3. Continued patency of the stented segment in the left circumflex  4. Severe congestive heart failure with markedly elevated right heart pressures and low cardiac  output She was felt to be she is well-revascularized with continued patency of grafts and stent sites, but had poor prognostic signs of low Cl and high RA pressure. She was started on IV amiodarone for her atrial fibrillation with pending TEE/DCCV, as well as IV Lasix for diuresis. Her severe cardiomyopathy was felt mixed ischemic/non-ischemic (tachy-mediated). Warfarin was re-initated. She had NSVT and potassium was supplemented. Digoxin was discontinued due to concomitant use of amiodarone.   She underwent TEE 04/01/12 but this demonstrated a small thrombus at the tip of the atrial appendage. Thus the plan was to rate control another 30 days then DCCV as an outpatient. She was transitioned to oral amiodarone. On this day, Dr. Excell Seltzer suspected her presentation (resp failure) was related completely to CHF and stopped antibiotic therapy. On 04/03/12, the patient developed acute alteration in mental status with slurring of speech, nausea, dry heaving, complaints of chest discomfort, and reduced responsiveness in setting of subtherapeutic INR. Neurology felt she had had a recurrent stroke, likely embolic, as stat noncontrast CT demonstrated streak artifacts across the pons making it difficult to exclude pontine infarct. She was not a candidate for TPA.The cardiology team and neurology team decided they did not want IV heparin at that point due to risk of hemorrhagic conversion. Coumadin was changed to Pradaxa on 04/05/12 after follow-up MRI was reviewed by neurology. Carotid dopplers demonstrated R: unremarkable; L: elevation of velocities wiht no evidence of significant plaque.  The major issues this hospitalization prolonging the latter half of her hospitalization, were 1) her volume status/CHF, requiring adjustment of her diuretics, 2) her renal function limiting diuresis/medication adjustment, and 3) rate control of her atrial fib requiring frequent med titration including re-addition of IV amiodarone and digoxin,  and 4) VT. On 04/11/12, she required initation of milrinone on 04/11/12 for continued CHF. She improved on a Lasix drip and metolazone. Her acute kidney injury was felt due to cardiorenal syndrome as this improved with treatment of her CHF.  On 04/14/12, the patient decompensated with persistent fast afib with rates in the 160s and frequent episodes of monomorphic VT requiring initiation of lidocaine. She was hypotensive and went unresponsive with SBP down into the 50's. Emergent synced DCCV was performed at 200J. She converted to NSR with improvement in her BP and neurologic status. Her MAP continued to be borderline thus levophed was temporarily started. Milrinone and lasix were decreased. Electrolytes were replaced. Post-cardioversion, neurology evaluated the patient for blurry vision and recommended continuing Pradaxa. They did not feel repeat MRI was warranted as it would not change treatment plan. Lidocaine was discontinued the following day. Her Lasix was temporarily held due to worsening renal function in the setting of hypotension.  On 04/15/12, she was titrated off norepinephrine and torsemide was re-initiated. By 04/16/12, she was off all drips. She was observed over the weekend and remained stable. Her volume status remained stable and she has worked with rehab and nutrition. As of today, her weight is down 57 lbs from admission. She has been walking with PT. SNF is recommended at discharge. Dr. Shirlee Latch has seen and examined her today and feels she is stable for discharge. She is not on aspirin due to use of Pradaxa. She will have close outpatient followup within 1 week. Her facility was provided a lab slip to obtain BMET, BNP, digoxin  level on day of followup. Her TSH while acutely ill was mildly elevated at 4.8 whereas it was normal 3 weeks ago, so will also recheck at her followup appointment given use of amiodarone. Should this be continued as an outpatient long term she will need periodic monitoring  of other organ systems. The stroke team has also left information on her chart regarding followup and instructions.  She is not currently on ACEI/ARB due to her fluctuating renal function this admission. She is on hydralazine/isordil for afterload reduction.  Discharge Vitals: Blood pressure 98/54, pulse 81, temperature 99.1 F (37.3 C), temperature source Oral, resp. rate 19, height 5' 0.6" (1.539 m), weight 267 lb 13.7 oz (121.5 kg), SpO2 96.00%.  Labs: Lab Results  Component Value Date   WBC 4.1 04/18/2012   HGB 8.9* 04/18/2012   HCT 30.8* 04/18/2012   MCV 74.0* 04/18/2012   PLT 231 04/18/2012    Lab 04/19/12 0329  NA 132*  K 3.9  CL 90*  CO2 37*  BUN 21  CREATININE 1.46*  CALCIUM 9.1  PROT --  BILITOT --  ALKPHOS --  ALT --  AST --  GLUCOSE 131*    Lab Results  Component Value Date   CHOL 88 04/06/2012   HDL 26* 04/06/2012   LDLCALC 44 04/06/2012   TRIG 90 04/06/2012    Diagnostic Studies/Procedures   Ct Head Wo Contrast 04/14/2012  *RADIOLOGY REPORT*  Clinical Data: Stroke  CT HEAD WITHOUT CONTRAST  Technique:  Contiguous axial images were obtained from the base of the skull through the vertex without contrast.  Comparison: 04/05/2012 and earlier studies  Findings: Atherosclerotic and physiologic intracranial calcifications. The small right cerebellar infarct seen on previous MR is inapparent.  Mild atrophy. There is no evidence of acute intracranial hemorrhage, brain edema, mass lesion, acute infarction,   mass effect, or midline shift. Acute infarct may be inapparent on noncontrast CT.  No other intra-axial abnormalities are seen, and the ventricles and sulci are within normal limits in size and symmetry.   No abnormal extra-axial fluid collections or masses are identified.  No significant calvarial abnormality.  IMPRESSION: 1. Negative for bleed or other acute intracranial process.   Original Report Authenticated By: D. Andria Rhein, MD    Ct Head Wo Contrast 04/03/2012  *RADIOLOGY  REPORT*  Clinical Data: Dysphasia.  Left-sided weakness.  Nausea.  CT HEAD WITHOUT CONTRAST  Technique:  Contiguous axial images were obtained from the base of the skull through the vertex without contrast.  Comparison: None.  Findings: Tiny, linear area of well defined low density in the mid brain on the right.  There is some streak artifact crossing the pons with some associated low density in the anterior pons, greater on the left.  Otherwise, normal appearing cerebral hemispheres and posterior fossa structures.  Normal size and position of the ventricles.  No intracranial hemorrhage, mass lesion or CT evidence of acute infarction.  Left sphenoid sinus mucosal thickening and small air fluid level.  IMPRESSION:  1.  Possible tiny, old right mid brain lacunar infarct. 2.  Streak artifacts crossing the pons, making it difficult to exclude a pontine infarct. 3.  No intracranial hemorrhage or definitive CT findings to indicate acute infarction. 4.  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  These results were called by telephone on 04/03/2012 at 1805 hours to Dr. Thad Ranger, who verbally acknowledged these results.   Original Report Authenticated By: Beckie Salts, M.D.    Mr [facility]  Wo Contrast 04/05/2012  *RADIOLOGY REPORT*  Clinical Data:  67 year old female with left side weakness, nausea.  Comparison: Head CT without contrast 04/03/2012.  MRI HEAD WITHOUT CONTRAST  Technique: Multiplanar, multiecho pulse sequences of the brain and surrounding structures were obtained according to standard protocol without intravenous contrast.  Findings: Patchy areas of mild to moderately restricted diffusion in the right cerebellum, primarily the superior cerebellar artery territory (series 5 images six and seven).  Associated T2 and subtle FLAIR hyperintensity.  No associated mass effect or evidence of acute hemorrhage.  Superimposed chronic left PICA infarct.  Questionable punctate area of restricted diffusion  in the upper right pons (series 5 image 10).  No other associated signal abnormality.  The brainstem is otherwise within normal limits.  There is a small focus of encephalomalacia at the left lateral temporal lobe with either chronic hemosiderin or laminar necrosis resulting in mild intrinsic T1 signal.  Supratentorially there are to possible small areas of mild diffusion abnormality, one in the right superior parietal lobe on series 5 image 24, and the second in the right superior frontal gyrus subcortical white matter on image 22.  Mild FLAIR hyperintensity associated with the latter.  No mass effect or hemorrhage associated with these sites.  No other diffusion abnormality.  Major intracranial vascular flow voids are preserved except for the distal left vertebral artery which might be nondominant.  MRA findings are below.  No ventriculomegaly. No midline shift, mass effect, or evidence of mass lesion.  No acute intracranial hemorrhage identified. Negative pituitary, cervicomedullary junction and visualized cervical spine. Visualized bone marrow signal is within normal limits.  Visualized orbit soft tissues are within normal limits.  Trace mastoid effusions.  Mild paranasal sinus mucosal thickening. Negative scalp soft tissues.  IMPRESSION: 1.  Patchy acute to subacute right cerebellar (SCA) territory infarcts.  No mass effect or hemorrhage. 2.  Questionable acute to subacute punctate right pontine and right MCA territory infarcts. The supra tentorial foci and might represent synchronous small vessel disease.  MRA findings below.  MRA HEAD WITHOUT CONTRAST  Technique: Angiographic images of the Circle of Willis were obtained using MRA technique without  intravenous contrast.  Findings: Antegrade flow in the distal right vertebral artery which appears to be dominant.  Little antegrade flow in the distal left vertebral artery which appears irregular.  The distal left vertebral artery functionally terminates in PICA,  and there is flow gap just proximal to the left vertebrobasilar junction (series 804 image 6).  Bilateral PICA flow is evident.  Bilateral AICA origins are patent.  Mild basilar artery irregularity without basilar stenosis.  Both SCA origins are patent, there is proximal right SCA flow identified.  There is a fetal type right PCA origin with little to no right P1 flow.  The left PCA origin is within normal limits.  Bilateral PCA branches are within normal limits.  The left posterior communicating artery also is present.  Antegrade flow in both ICA siphons.  Mild ICA irregularity and no ICA stenosis.  Ophthalmic artery origins are not well delineated. Posterior communicating artery origins are within normal limits. Normal carotid termini, MCA and ACA origins.  Diminutive or absent anterior communicating artery.  Visualized ACA branches are within normal limits.  Visualized right MCA branches are within normal limits.  Mild irregularity of the left MCA M1 segment without significant stenosis.  Visualized left MCA branches are within normal limits.  IMPRESSION:  1.  Decreased flow in the distal left vertebral artery which may  be stenotic and/or nondominant.  It functionally terminates in the left PICA which appears remain patent.  2.  Dominant right vertebral artery and basilar artery without stenosis.  Bilateral PICA, AICA, and SCA origins and proximal segments appear patent.  3.  Fetal type right PCA origin.  Remaining posterior circulation and visualized anterior circulation otherwise within normal limits.  Study discussed by telephone with Stroke team provider Annie Main on 04/05/2012 at 1230 hours.   Original Report Authenticated By: Erskine Speed, M.D.    Mr Brain Wo Contrast 04/05/2012  *RADIOLOGY REPORT*  Clinical Data:  67 year old female with left side weakness, nausea.  Comparison: Head CT without contrast 04/03/2012.  MRI HEAD WITHOUT CONTRAST  Technique: Multiplanar, multiecho pulse sequences of the brain  and surrounding structures were obtained according to standard protocol without intravenous contrast.  Findings: Patchy areas of mild to moderately restricted diffusion in the right cerebellum, primarily the superior cerebellar artery territory (series 5 images six and seven).  Associated T2 and subtle FLAIR hyperintensity.  No associated mass effect or evidence of acute hemorrhage.  Superimposed chronic left PICA infarct.  Questionable punctate area of restricted diffusion in the upper right pons (series 5 image 10).  No other associated signal abnormality.  The brainstem is otherwise within normal limits.  There is a small focus of encephalomalacia at the left lateral temporal lobe with either chronic hemosiderin or laminar necrosis resulting in mild intrinsic T1 signal.  Supratentorially there are to possible small areas of mild diffusion abnormality, one in the right superior parietal lobe on series 5 image 24, and the second in the right superior frontal gyrus subcortical white matter on image 22.  Mild FLAIR hyperintensity associated with the latter.  No mass effect or hemorrhage associated with these sites.  No other diffusion abnormality.  Major intracranial vascular flow voids are preserved except for the distal left vertebral artery which might be nondominant.  MRA findings are below.  No ventriculomegaly. No midline shift, mass effect, or evidence of mass lesion.  No acute intracranial hemorrhage identified. Negative pituitary, cervicomedullary junction and visualized cervical spine. Visualized bone marrow signal is within normal limits.  Visualized orbit soft tissues are within normal limits.  Trace mastoid effusions.  Mild paranasal sinus mucosal thickening. Negative scalp soft tissues.  IMPRESSION: 1.  Patchy acute to subacute right cerebellar (SCA) territory infarcts.  No mass effect or hemorrhage. 2.  Questionable acute to subacute punctate right pontine and right MCA territory infarcts. The supra  tentorial foci and might represent synchronous small vessel disease.  MRA findings below.  MRA HEAD WITHOUT CONTRAST  Technique: Angiographic images of the Circle of Willis were obtained using MRA technique without  intravenous contrast.  Findings: Antegrade flow in the distal right vertebral artery which appears to be dominant.  Little antegrade flow in the distal left vertebral artery which appears irregular.  The distal left vertebral artery functionally terminates in PICA, and there is flow gap just proximal to the left vertebrobasilar junction (series 804 image 6).  Bilateral PICA flow is evident.  Bilateral AICA origins are patent.  Mild basilar artery irregularity without basilar stenosis.  Both SCA origins are patent, there is proximal right SCA flow identified.  There is a fetal type right PCA origin with little to no right P1 flow.  The left PCA origin is within normal limits.  Bilateral PCA branches are within normal limits.  The left posterior communicating artery also is present.  Antegrade flow in both ICA siphons.  Mild ICA irregularity and no ICA stenosis.  Ophthalmic artery origins are not well delineated. Posterior communicating artery origins are within normal limits. Normal carotid termini, MCA and ACA origins.  Diminutive or absent anterior communicating artery.  Visualized ACA branches are within normal limits.  Visualized right MCA branches are within normal limits.  Mild irregularity of the left MCA M1 segment without significant stenosis.  Visualized left MCA branches are within normal limits.  IMPRESSION:  1.  Decreased flow in the distal left vertebral artery which may be stenotic and/or nondominant.  It functionally terminates in the left PICA which appears remain patent.  2.  Dominant right vertebral artery and basilar artery without stenosis.  Bilateral PICA, AICA, and SCA origins and proximal segments appear patent.  3.  Fetal type right PCA origin.  Remaining posterior circulation and  visualized anterior circulation otherwise within normal limits.  Study discussed by telephone with Stroke team provider Annie Main on 04/05/2012 at 1230 hours.   Original Report Authenticated By: Erskine Speed, M.D.    Dg Chest Port 1 View 04/03/2012  *RADIOLOGY REPORT*  Clinical Data: Short of breath and chest tightness  PORTABLE CHEST - 1 VIEW  Comparison: 03/29/2012  Findings: Right PICC tip in the right innominate vein is unchanged.  Cardiac enlargement with changes of CABG.  No   heart failure. Mild bibasilar atelectasis or scarring.  No significant effusion. Negative for pneumonia.  IMPRESSION: Mild bibasilar atelectasis or scarring.   Original Report Authenticated By: Janeece Riggers, M.D.    Dg Chest Port 1 View 03/29/2012  *RADIOLOGY REPORT*  Clinical Data: Line placement.  PORTABLE CHEST - 1 VIEW  Comparison: 03/28/2012 and 03/26/2012.  Findings: 1007 hours.  Right arm PICC has been placed, the tip projecting over the right paratracheal region consistent with position in the proximal SVC.  The patient remains mildly rotated to the right.  There is stable cardiomegaly status post CABG. Overall lung volumes have improved.  There is no pneumothorax or significant pleural effusion.  IMPRESSION:  1.  Right arm PICC projects to the upper SVC level. 2.  No pneumothorax identified.  Basilar aeration has improved.   Original Report Authenticated By: Carey Bullocks, M.D.    Dg Chest Port 1 View 03/28/2012  *RADIOLOGY REPORT*  Clinical Data: Hypoxia.  PORTABLE CHEST - 1 VIEW  Comparison: Chest radiograph performed 03/26/2012  Findings: The lungs are hypoexpanded.  Patchy bilateral airspace opacities raise concern for pneumonia.  No definite pleural effusion or pneumothorax is seen.  The cardiomediastinal silhouette is enlarged; the patient is status post median sternotomy, with evidence of prior CABG.  No acute osseous abnormalities are seen.  IMPRESSION:  1.  Lungs hypoexpanded.  Patchy bilateral airspace opacities  raise concern for pneumonia. 2.  Cardiomegaly.  These results were called by telephone on 03/28/2012 at 02:01 a.m. to [facility] on [id number], who verbally acknowledged these results.   Original Report Authenticated By: Tonia Ghent, M.D.    Portable Chest 1 View 03/26/2012  *RADIOLOGY REPORT*  Clinical Data: CHF  PORTABLE CHEST - 1 VIEW  Comparison: None.  Findings: Previous CABG.  Mild cardiomegaly.  Relatively low lung volumes with resultant crowding of perihilar bronchovascular structures.  Cannot exclude mild central pulmonary vascular congestion.  No effusion.  IMPRESSION:  1.  Cardiomegaly with question of mild central pulmonary vascular congestion.   Original Report Authenticated By: D. Andria Rhein, MD    2D Echo 03/26/12 Study Conclusions - Left ventricle: Technically limited study. The cavity  size was moderately to severely dilated. The estimated ejection fraction was 25%. Diffuse hypokinesis. There is septal flattening. - Aortic valve: Sclerosis without stenosis. - Mitral valve: Mild regurgitation. - Left atrium: The atrium was moderately dilated. - Right ventricle: The cavity size was moderately dilated. Systolic function was moderately reduced. - Pulmonary arteries: PA peak pressure: 47mm Hg (S).  Discharge Medications   Current Discharge Medication List    START taking these medications   Details  amiodarone (PACERONE) 200 MG tablet Take 2 tablets (400 mg total) by mouth daily. Qty: 60 tablet, Refills: 2    atorvastatin (LIPITOR) 10 MG tablet Take 1 tablet (10 mg total) by mouth daily. Qty: 30 tablet, Refills: 2    carvedilol (COREG) 3.125 MG tablet Take 1 tablet (3.125 mg total) by mouth 2 (two) times daily with a meal. Qty: 60 tablet, Refills: 2    dabigatran (PRADAXA) 75 MG CAPS Take 1 capsule (75 mg total) by mouth every 12 (twelve) hours. Qty: 60 capsule, Refills: 2    digoxin (LANOXIN) 0.125 MG tablet Take 1 tablet (0.125 mg total) by mouth every other day. Qty:  30 tablet, Refills: 2    hydrALAZINE (APRESOLINE) 10 MG tablet Take 1 tablet (10 mg total) by mouth every 8 (eight) hours. Qty: 90 tablet, Refills: 2    insulin aspart (NOVOLOG) 100 UNIT/ML injection Sliding Scale Insulin 0-15 Units, Subcutaneous, 3 times daily with meals Correction coverage: Moderate CBG < 70: implement hypoglycemia protocol CBG 70 - 120: 0 units CBG 121 - 150: 2 units CBG 151 - 200: 3 units CBG 201 - 250: 5 units CBG 251 - 300: 8 units CBG 301 - 350: 11 units CBG 351 - 400: 15 units CBG > 400: call MD    isosorbide dinitrate (ISORDIL) 10 MG tablet Take 1 tablet (10 mg total) by mouth every 8 (eight) hours. Qty: 90 tablet, Refills: 2    montelukast (SINGULAIR) 10 MG tablet Take 1 tablet (10 mg total) by mouth at bedtime. Qty: 30 tablet, Refills: 0   Comments: Follow up with your primary care doctor or pulmonologist for this medicine.    potassium chloride SA (K-DUR,KLOR-CON) 20 MEQ tablet Take 2 tablets (40 mEq total) by mouth 2 (two) times daily. Qty: 120 tablet, Refills: 2    torsemide (DEMADEX) 20 MG tablet Take 3 tablets (60 mg total) by mouth daily. Qty: 90 tablet, Refills: 2    traZODone (DESYREL) 150 MG tablet Take 1 tablet (150 mg total) by mouth at bedtime. Qty: 30 tablet, Refills: 0   Comments: Follow up with your neurologist for this medicine.      CONTINUE these medications which have CHANGED   Details  rOPINIRole (REQUIP) 1 MG tablet Take 2 tablets (2 mg total) by mouth at bedtime.      CONTINUE these medications which have NOT CHANGED   Details  fluticasone (FLONASE) 50 MCG/ACT nasal spray Place 2 sprays into the nose daily.    Fluticasone-Salmeterol (ADVAIR) 250-50 MCG/DOSE AEPB Inhale 1 puff into the lungs every 12 (twelve) hours.    HYDROcodone-acetaminophen (LORTAB) 10-500 MG per tablet Take 1 tablet by mouth every 8 (eight) hours as needed. For pain    omeprazole (PRILOSEC) 40 MG capsule Take 40 mg by mouth daily.      STOP  taking these medications     aspirin EC 81 MG tablet Comments:  Reason for Stopping:       cyclobenzaprine (FLEXERIL) 10 MG tablet Comments:  Reason for Stopping:  enalapril (VASOTEC) 20 MG tablet Comments:  Reason for Stopping:       furosemide (LASIX) 40 MG tablet Comments:  Reason for Stopping:       meclizine (ANTIVERT) 25 MG tablet Comments:  Reason for Stopping:       metFORMIN (GLUCOPHAGE) 500 MG tablet Comments:  Reason for Stopping:       metoprolol (LOPRESSOR) 50 MG tablet Comments:  Reason for Stopping:       prasugrel (EFFIENT) 10 MG TABS Comments:  Reason for Stopping:       topiramate (TOPAMAX) 200 MG tablet Comments:  Reason for Stopping:       zolpidem (AMBIEN) 10 MG tablet Comments:  Reason for Stopping:          Disposition   The patient will be discharged in stable condition to home. Discharge Orders    Future Appointments: Provider: Department: Dept Phone: Center:   04/26/2012 1:40 PM Prescott Parma, PA [address] (near [address]) [phone] [email or web address]     Future Orders Please Complete By Expires   Diet - low sodium heart healthy      Increase activity slowly      Discharge instructions      Comments:   * Your heart ultrasound showed weakness of the heart muscle this admission. This may make you more susceptible to weight gain from fluid retention, which can lead to symptoms that we call heart failure. For patients with this condition, we give them these special instructions:  1. Follow a low-salt diet and watch your fluid intake. In general, you should not be taking in more than 2 liters of fluid per day. Some patients are restricted to less than 1.5 liters. This includes sources of water in foods like soup. 2. Weigh yourself on the same scale at same time of day and keep a log. 3. Call your doctor: (Anytime you feel any of the following symptoms)  - 3-4 pound weight gain in 1-2 days or 2 pounds overnight  - Shortness of  breath, with or without a dry hacking cough  - Swelling in the hands, feet or stomach  - If you have to sleep on extra pillows at night in order to breathe   * Patients on amiodarone may require additional periodic monitoring of other organ systems, such as lungs, thyroid, eyes, and liver function. Please talk to your doctor at your follow-up appointments about what monitoring may be necessary for you.  * Nursing facility: keep O2 sat >90%.     Follow-up Information    Follow up with Gates Rigg, MD. Schedule an appointment as soon as possible for a visit today. (stroke clinic)    Contact information:   [address], [address] [facility] [place] [address] [phone]       Follow up with Prescott Parma, PA. (04/26/12 at 1:40pm)    Contact information:   [facility] [address] [place], [address] [place] [address] [phone]       Follow up with Labs. (Please have BMET, BNP, digoxin level, TSH drawn on 04/26/12, results to Prescott Parma PA-C)            Duration of Discharge Encounter: Greater than 30 minutes including physician and PA time.  Signed, Ronie Spies PA-C 04/19/2012, 2:21 PM

## 2012-04-19 NOTE — Progress Notes (Signed)
Patient ID: Caitlyn Hardy, female   DOB: 19-Oct-1945, 67 y.o.   MRN: [id number]  SUBJECTIVE:  Ms Chronis is doing quite well today and had a good weekend.  She has been walking with PT.  Weight is 57 lbs down since admission.      Marland Kitchen amiodarone  400 mg Oral BID  . antiseptic oral rinse  15 mL Mouth Rinse BID  . atorvastatin  10 mg Oral q1800  . budesonide  0.5 mg Nebulization BID  . carvedilol  3.125 mg Oral BID WC  . dabigatran  75 mg Oral Q12H  . digoxin  0.125 mg Oral QODAY  . feeding supplement  237 mL Oral Q24H  . hydrALAZINE  10 mg Oral Q8H  . insulin aspart  0-15 Units Subcutaneous TID WC  . isosorbide dinitrate  10 mg Oral Q8H  . montelukast  10 mg Oral QHS  . polyethylene glycol  17 g Oral Daily  . potassium chloride  40 mEq Oral TID  . rOPINIRole  2 mg Oral QHS  . torsemide  60 mg Oral Daily  . traZODone  150 mg Oral QHS    Filed Vitals:   04/18/12 2333 04/19/12 0335 04/19/12 0518 04/19/12 0645  BP: 136/68 132/63  125/47  Pulse:   74 78  Temp: 98 F (36.7 C) 98.4 F (36.9 C)    TempSrc: Oral Oral    Resp: 20 20    Height:      Weight:      SpO2: 97% 97% 97% 97%    Intake/Output Summary (Last 24 hours) at 04/19/12 0800 Last data filed at 04/18/12 1400  Gross per 24 hour  Intake    350 ml  Output    550 ml  Net   -200 ml    LABS: Basic Metabolic Panel:  Basename 04/19/12 0329 04/18/12 0450  NA 132* 136  K 3.9 2.9*  CL 90* 90*  CO2 37* 39*  GLUCOSE 131* 117*  BUN 21 26*  CREATININE 1.46* 1.46*  CALCIUM 9.1 9.1  MG -- --  PHOS -- --   Liver Function Tests: No results found for this basename: AST:2,ALT:2,ALKPHOS:2,BILITOT:2,PROT:2,ALBUMIN:2 in the last 72 hours No results found for this basename: LIPASE:2,AMYLASE:2 in the last 72 hours CBC:  Basename 04/18/12 0450 04/17/12 0350  WBC 4.1 5.2  NEUTROABS -- --  HGB 8.9* 8.8*  HCT 30.8* 30.6*  MCV 74.0* 73.9*  PLT 231 235    PHYSICAL EXAM General: NAD Neck: JVP 7 cm, no thyromegaly or thyroid  nodule.  Lungs: Crackles at bases bilaterally CV: Nondisplaced PMI.  Heart regular S1/S2, no S3/S4, 1/6 HSM LLSB.  Trace ankle edema.   Abdomen: Soft, nontender, no hepatosplenomegaly, no distention.  Neurologic: Alert and oriented x 3.  Psych: Normal affect. Extremities: No clubbing or cyanosis.   TELEMETRY: NSR in 90s  ASSESSMENT AND PLAN:  67 yo admitted with acute cerebellar CVA on 12/26 also with NSTEMI (patent grafts on cath), acute on chronic systolic CHF, and poorly controlled atrial fibrillation.  1. CHF: Acute on chronic systolic.  Weight is down 57 lbs since admission.  Off milrinone and Lasix gtt and now on po torsemide.  Diuresing well with po torsemide. - Continue po torsemide - Continue qod digoxin as well as current hydralazine and isordil. - Start Coreg 3.125 mg bid today. 2. ? CVA: Visual changes when hypotensive.  She was emergently cardioverted (unable to visualize LAA appendage on prior TEE).  Continue Pradaxa (renally dosed with  GFR < 30).  CT head unremarkable.   3. Atrial fibrillation:  She was not cardioverted earlier because LAA appendage could not be adequately visualized on TEE.  Emergent cardioversion and now in NSR.  Now on po amiodarone => will decrease to 400 mg daily at discharge.   4. CKD: AKI in setting of hypotensive episode.  Creatinine improved compared to last week.  Continue to follow closely. 5. CAD: Grafts patent on cath this hospitalization.  Suspect NSTEMI due to demand ischemia.  6. Disposition: SNF in [place] today if bed available.  Needs to followup in [place] next week with me or Gene (make sure she is seen ASAP post-followup).  She will need a BMET, BNP, and digoxin level drawn at followup.  Meds for discharge: amiodarone 400 mg daily, atorvastatin 10 mg daily, Pradaxa 75 mg bid, digoxin 0.125 mg every other day, hydralazine 10 mg tid, isordil 10 mg tid, torsemide 60 mg daily, Coreg 3.125 mg bid, KCl 40 mEq bid.   [name] 04/19/2012 8:00  AM

## 2012-04-20 LAB — GLUCOSE, CAPILLARY: Glucose-Capillary: 156 mg/dL — ABNORMAL HIGH (ref 70–99)

## 2012-04-21 ENCOUNTER — Telehealth: Payer: Self-pay

## 2012-04-21 NOTE — Telephone Encounter (Signed)
Phone number listed is incorrect per person answering.  N/A at emergency contact number.

## 2012-04-26 ENCOUNTER — Ambulatory Visit (INDEPENDENT_AMBULATORY_CARE_PROVIDER_SITE_OTHER): Payer: Medicare Other | Admitting: Physician Assistant

## 2012-04-26 VITALS — BP 128/74 | HR 82 | Ht 66.0 in | Wt 273.4 lb

## 2012-04-26 DIAGNOSIS — I639 Cerebral infarction, unspecified: Secondary | ICD-10-CM

## 2012-04-26 DIAGNOSIS — I2589 Other forms of chronic ischemic heart disease: Secondary | ICD-10-CM

## 2012-04-26 DIAGNOSIS — I5022 Chronic systolic (congestive) heart failure: Secondary | ICD-10-CM

## 2012-04-26 DIAGNOSIS — I5023 Acute on chronic systolic (congestive) heart failure: Secondary | ICD-10-CM

## 2012-04-26 DIAGNOSIS — I6350 Cerebral infarction due to unspecified occlusion or stenosis of unspecified cerebral artery: Secondary | ICD-10-CM

## 2012-04-26 DIAGNOSIS — I2510 Atherosclerotic heart disease of native coronary artery without angina pectoris: Secondary | ICD-10-CM

## 2012-04-26 DIAGNOSIS — I4891 Unspecified atrial fibrillation: Secondary | ICD-10-CM

## 2012-04-26 DIAGNOSIS — I509 Heart failure, unspecified: Secondary | ICD-10-CM

## 2012-04-26 DIAGNOSIS — I255 Ischemic cardiomyopathy: Secondary | ICD-10-CM

## 2012-04-26 MED ORDER — CARVEDILOL 6.25 MG PO TABS: 6.2500 mg | ORAL_TABLET | Freq: Two times a day (BID) | ORAL | Status: DC

## 2012-04-26 NOTE — Assessment & Plan Note (Signed)
Maintaining NSR on amiodarone, status post emergent DCCV in setting of hypotension/recurrent VT. Continue Pradaxa anticoagulation, as recommended.

## 2012-04-26 NOTE — Progress Notes (Addendum)
Primary Cardiologist: Jerral Bonito, MD (new)   HPI: Post hospital followup from [facility], status post prolonged, complex hospitalization. Admitted December 26, direct transfer from [facility], per patient presented with acute stroke, and discharge from [facility] January 20.  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   - Right/left cardiac catheterization, December 31: Severe 3v CAD; patent CFX stent; patent L.-LAD, S.-DX, and S.-PDA grafts   - 2-D echocardiogram: EF 25%, global HK, mild MR, PHTN (RVSP 47 mmHg)   - TEE, January 2: Possible small LAA thrombus   - Emergent DCCV, in setting of hypotension/unresponsiveness and recurrent monomorphic VT - successfully converted to NSR  Patient required treatment with IV amiodarone and digoxin for rate control of AF; milrinone and Lasix drip for treatment of CHF; and vasopressor support for hypotension. Patient was transitioned from Coumadin to Pradaxa, following evaluation by neurology.   She presents today feeling "100% better". She is currently in a nursing center undergoing rehabilitation. Labs were drawn earlier today, results currently unavailable. She denies CP, tachycardia palpitations, or PND. She does still have to sleep with her head elevated. She reports much less DOE with ambulation, as well as significant improvement in her long-standing peripheral edema.   12-lead EKG today, reviewed by me, NSR 82 bpm; Q waves in leads 1, aVL with less than 1 mm J-point elevation; J-point elevation in lead V2.  Allergies  Allergen Reactions  . Penicillins Anaphylaxis  . Azithromycin Hives and Rash  . Bactrim (Sulfamethoxazole W-Trimethoprim) Hives and Rash  . Sulfa Antibiotics Hives and Rash    Current Outpatient Prescriptions  Medication Sig Dispense Refill  . amiodarone  (PACERONE) 200 MG tablet Take 2 tablets (400 mg total) by mouth daily.  60 tablet  2  . atorvastatin (LIPITOR) 10 MG tablet Take 1 tablet (10 mg total) by mouth daily.  30 tablet  2  . carvedilol (COREG) 3.125 MG tablet Take 1 tablet (3.125 mg total) by mouth 2 (two) times daily with a meal.  60 tablet  2  . dabigatran (PRADAXA) 75 MG CAPS Take 1 capsule (75 mg total) by mouth every 12 (twelve) hours.  60 capsule  2  . digoxin (LANOXIN) 0.125 MG tablet Take 1 tablet (0.125 mg total) by mouth every other day.  30 tablet  2  . fluticasone (FLONASE) 50 MCG/ACT nasal spray Place 2 sprays into the nose daily.      . Fluticasone-Salmeterol (ADVAIR) 250-50 MCG/DOSE AEPB Inhale 1 puff into the lungs every 12 (twelve) hours.      . hydrALAZINE (APRESOLINE) 10 MG tablet Take 1 tablet (10 mg total) by mouth every 8 (eight) hours.  90 tablet  2  . hydrOXYzine (ATARAX/VISTARIL) 25 MG tablet Take 25 mg by mouth 2 (two) times daily as needed.      . insulin aspart (NOVOLOG) 100 UNIT/ML injection Sliding Scale Insulin 0-15 Units, Subcutaneous, 3 times daily with meals Correction coverage: Moderate CBG < 70: implement hypoglycemia protocol CBG 70 - 120: 0 units CBG 121 - 150: 2 units CBG 151 - 200: 3 units CBG 201 - 250: 5 units CBG 251 - 300: 8 units CBG 301 - 350: 11 units CBG 351 - 400: 15 units CBG > 400: call MD      . isosorbide dinitrate (ISORDIL) 10 MG tablet Take 1 tablet (10 mg total) by mouth every 8 (eight) hours.  90 tablet  2  . montelukast (SINGULAIR) 10 MG tablet Take 1 tablet (10 mg total) by mouth at bedtime.  30 tablet  0  . omeprazole (PRILOSEC) 40 MG capsule Take 40 mg by mouth daily.      . potassium chloride SA (K-DUR,KLOR-CON) 20 MEQ tablet Take 2 tablets (40 mEq total) by mouth 2 (two) times daily.  120 tablet  2  . rOPINIRole (REQUIP) 1 MG tablet Take 2 tablets (2 mg total) by mouth at bedtime.      . torsemide (DEMADEX) 20 MG tablet Take 3 tablets (60 mg total) by mouth daily.  90  tablet  2    Past Medical History  Diagnosis Date  . CAD (coronary artery disease)     a. CABG 2001. b. PCI 2011. c. NSTEMI 12/13-03/2012 in prolonged hosp stay, felt demand isch with stable cath.  . Hypertension   . Atrial fibrillation     a. Diagnosed 02/2012 - long hosp stay, difficult to control rates, LAA thrombus noted, needed emergent DCCV for VT/afib/hypotension 04/14/2012.  . Depression   . COPD (chronic obstructive pulmonary disease)   . Sleep apnea     CPAP qhs  . Diabetes mellitus   . Chronic systolic CHF (congestive heart failure)     a. EF 40% 2011, down to 25% 03/2012 felt mixed isch/nonischemic (tachy-mediated).  . Chronic kidney disease     a. AKI 03/2012 felt cardiorenal & also due to hypotension.  . H/O hiatal hernia   . Headache   . Arthritis   . Anemia   . Stroke     a. Stroke 02/2012 with recurrent CVA in-hospital 03/2012 felt embolic.  . Thrombus of left atrial appendage     a. Possible LAA thrombus on TEE 04/01/12.  . Morbid obesity   . On home oxygen therapy   . VT (ventricular tachycardia)     a. 03/2012.  . Thrombocytopenia   . Restless leg     Past Surgical History  Procedure Date  . Coronary artery bypass graft 2001    x 4 vessels  . Abdominal hysterectomy   . ;   . Rhinosplasty     x 2  . Tonsillectomy   . Knee replacemnt 2000    total right  . Shoulder sx 2000    right bone spurs  . Cardiac catheterization 2013  . Tee without cardioversion 04/01/2012    Procedure: TRANSESOPHAGEAL ECHOCARDIOGRAM (TEE);  Surgeon: Vesta Mixer, MD;  Location: [facility] END[facility]COPY;  Service: Cardiovascular;  Laterality: N/A;    History   Social History  . Marital Status: Single    Spouse Name: N/A    Number of Children: N/A  . Years of Education: N/A   Occupational History  . Not on file.   Social History Main Topics  . Smoking status: Former Games developer  . Smokeless tobacco: Not on file  . Alcohol Use: No  . Drug Use: No  . Sexually Active: Not on file    Other Topics Concern  . Not on file   Social History Narrative  . No narrative on file    No family history on file.  ROS: no nausea, vomiting; no fever, chills; no melena, hematochezia; no claudication  PHYSICAL EXAM: BP 128/74  Pulse 82  Ht 5\' 6"  (1.676 m)  Wt 273 lb 6.4 oz (124.013 kg)  BMI 44.13 kg/m2 GENERAL: 67 year old female, obese; NAD HEENT: NCAT, PERRLA, EOMI; sclera clear; no xanthelasma NECK: palpable bilateral carotid pulses,  no bruits; no JVD; no TM LUNGS: CTA bilaterally CARDIAC: RRR (S1, S2); no significant murmurs; no rubs or gallops ABDOMEN: soft, non-tender; intact BS EXTREMETIES: 2+ bilateral, nonpitting peripheral edema SKIN: warm/dry; no obvious rash/lesions MUSCULOSKELETAL: no joint deformity NEURO: no focal deficit; NL affect   EKG: reviewed and available in Electronic Records   ASSESSMENT & PLAN:  Ischemic cardiomyopathy Will increase carvedilol to 6.25 twice a day, in attempt to optimize her medication regimen. Target dose 25 twice a day. Continue current combination regimen of hydralazine/long-acting nitrates, given her underlying CKD. We'll order a followup echocardiogram in 3 months for reassessment of LVF and, if at or below 35%, consider referral to EP for possible ICD implantation. Will arrange early followup in one month, at which time she will establish with Dr. Myrtis Ser.  Atrial fibrillation with RVR Maintaining NSR on amiodarone, status post emergent DCCV in setting of hypotension/recurrent VT. Continue Pradaxa anticoagulation, as recommended.  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 Patient instructed to follow with Dr. Pearlean Brownie in [place], as recommended.  CAD (coronary artery disease) Discussed results of recent catheterization which indicated patent stent site and bypass grafts, in setting of NST EMI (type II). Continue medical management.  Acute on chronic systolic  congestive heart failure, NYHA class 4 Markedly clinically improved, per patient. Continue current diuretic regimen with close monitoring of electrolytes and renal function.    Gene Salina Stanfield, PAC

## 2012-04-26 NOTE — Patient Instructions (Addendum)
   Increase Coreg to 6.25mg  twice a day   Continue all other current medications. Echo - due in 3 months Please fax most recent labs to office:  [phone]   Follow up in  1 month

## 2012-04-26 NOTE — Assessment & Plan Note (Signed)
Discussed results of recent catheterization which indicated patent stent site and bypass grafts, in setting of NST EMI (type II). Continue medical management.

## 2012-04-26 NOTE — Assessment & Plan Note (Signed)
Markedly clinically improved, per patient. Continue current diuretic regimen with close monitoring of electrolytes and renal function.

## 2012-04-26 NOTE — Assessment & Plan Note (Signed)
Will increase carvedilol to 6.25 twice a day, in attempt to optimize her medication regimen. Target dose 25 twice a day. Continue current combination regimen of hydralazine/long-acting nitrates, given her underlying CKD. We'll order a followup echocardiogram in 3 months for reassessment of LVF and, if at or below 35%, consider referral to EP for possible ICD implantation. Will arrange early followup in one month, at which time she will establish with Dr. Myrtis Ser.

## 2012-04-26 NOTE — Assessment & Plan Note (Addendum)
Status post recurrent stroke, presumed embolic, in setting of LA a thrombus. Patient instructed to follow with Dr. Pearlean Brownie in [place], as recommended.

## 2012-05-11 ENCOUNTER — Telehealth: Payer: Self-pay | Admitting: Physician Assistant

## 2012-05-11 MED ORDER — DABIGATRAN ETEXILATE MESYLATE 75 MG PO CAPS: 75.0000 mg | ORAL_CAPSULE | Freq: Two times a day (BID) | ORAL | Status: DC

## 2012-05-11 NOTE — Telephone Encounter (Signed)
needs refill on Pradaxa 75mg   Per son. Caitlyn Hardy was taken off Pradaxa while a patient in the hospital with Pneumonia.

## 2012-05-11 NOTE — Telephone Encounter (Signed)
Refill okayed per Gene Serpe, PA - refill sent to pharm as requested.

## 2012-05-15 ENCOUNTER — Other Ambulatory Visit: Payer: Self-pay

## 2012-05-20 ENCOUNTER — Telehealth: Payer: Self-pay | Admitting: *Deleted

## 2012-05-20 NOTE — Telephone Encounter (Signed)
Left message to return call 

## 2012-05-20 NOTE — Telephone Encounter (Signed)
I do not typically prescribe sleep medications. Recommend pt discuss this with PMD or Neurology.

## 2012-05-20 NOTE — Telephone Encounter (Signed)
Son Kathlene November) calling - states PMD (Dr. Toni [facility]) has concerns over patients Trazodone.  States that he wants to know if we have any suggestions as to anything else she could use for sleep that will not interfere with any of her heart medications (in particular her Amiodarone & Digoxin).  Does have OV with Neurologist on 4/1.  Will be due to see [place] 3/5 with Gene.

## 2012-05-25 NOTE — Telephone Encounter (Signed)
Son informed.

## 2012-05-27 ENCOUNTER — Telehealth: Payer: Self-pay | Admitting: *Deleted

## 2012-05-27 NOTE — Telephone Encounter (Signed)
HH calling to report vitals - this morning around 9:00 am -  BP - 164/68  HR - 48 Resp - 16 Temp - 97.4  Did c/o not feeling good / weakness today.  HH nurse did advise patient that if she felt worse to go to ED for evaluation.   Patient has OV scheduled for 3/5 with Gene.

## 2012-05-28 MED ORDER — AMIODARONE HCL 400 MG PO TABS: 200.0000 mg | ORAL_TABLET | Freq: Every day | ORAL | Status: DC

## 2012-05-28 NOTE — Telephone Encounter (Signed)
Patient notified.  She will follow up on 3/5.

## 2012-05-28 NOTE — Telephone Encounter (Signed)
Decrease amiodarone to 200 mg daily. Reassess vitals at f/u OV.

## 2012-06-02 ENCOUNTER — Encounter: Payer: Self-pay | Admitting: Physician Assistant

## 2012-06-02 ENCOUNTER — Ambulatory Visit (INDEPENDENT_AMBULATORY_CARE_PROVIDER_SITE_OTHER): Payer: Medicare Other | Admitting: Physician Assistant

## 2012-06-02 VITALS — BP 134/66 | HR 50 | Ht 66.0 in | Wt 260.1 lb

## 2012-06-02 DIAGNOSIS — I2589 Other forms of chronic ischemic heart disease: Secondary | ICD-10-CM

## 2012-06-02 DIAGNOSIS — I4891 Unspecified atrial fibrillation: Secondary | ICD-10-CM

## 2012-06-02 DIAGNOSIS — I6350 Cerebral infarction due to unspecified occlusion or stenosis of unspecified cerebral artery: Secondary | ICD-10-CM

## 2012-06-02 DIAGNOSIS — I255 Ischemic cardiomyopathy: Secondary | ICD-10-CM

## 2012-06-02 DIAGNOSIS — I5023 Acute on chronic systolic (congestive) heart failure: Secondary | ICD-10-CM | POA: Insufficient documentation

## 2012-06-02 DIAGNOSIS — I639 Cerebral infarction, unspecified: Secondary | ICD-10-CM

## 2012-06-02 DIAGNOSIS — I5022 Chronic systolic (congestive) heart failure: Secondary | ICD-10-CM

## 2012-06-02 MED ORDER — AMIODARONE HCL 200 MG PO TABS: 200.0000 mg | ORAL_TABLET | Freq: Every day | ORAL | Status: DC

## 2012-06-02 MED ORDER — DIGOXIN 125 MCG PO TABS: 0.0625 mg | ORAL_TABLET | Freq: Every day | ORAL | Status: DC

## 2012-06-02 NOTE — Assessment & Plan Note (Signed)
Euvolemic by history and exam. She has diuresed 13 pounds since last OV. Will request most recent routine labs from primary MDs office. Continue current diuretic regimen.

## 2012-06-02 NOTE — Assessment & Plan Note (Signed)
Followed by Dr. Pearlean Brownie, with whom she is scheduled to follow next month.

## 2012-06-02 NOTE — Progress Notes (Signed)
Primary Cardiologist: Jerral Bonito, MD   HPI: Scheduled one-month followup.  When last seen on January 27, I increased carvedilol to 6.25 twice a day. Plan is to reassess LVF (EF 25%) after 3 months, on optimal medical therapy.  Clinically, she reports much less SOB and DOE. She denies CP or palpitations. She is on supplemental oxygen, as well as CPAP. She was recently diagnosed with anemia, and has been placed on supplemental iron.  Allergies  Allergen Reactions  . Penicillins Anaphylaxis  . Azithromycin Hives and Rash  . Bactrim (Sulfamethoxazole W-Trimethoprim) Hives and Rash  . Sulfa Antibiotics Hives and Rash    Current Outpatient Prescriptions  Medication Sig Dispense Refill  . amiodarone (PACERONE) 400 MG tablet Take 0.5 tablets (200 mg total) by mouth daily.      Marland Kitchen atorvastatin (LIPITOR) 10 MG tablet Take 1 tablet (10 mg total) by mouth daily.  30 tablet  2  . carvedilol (COREG) 6.25 MG tablet Take 1 tablet (6.25 mg total) by mouth 2 (two) times daily with a meal.      . dabigatran (PRADAXA) 75 MG CAPS Take 75 mg by mouth daily.      . digoxin (LANOXIN) 0.125 MG tablet Take 1 tablet (0.125 mg total) by mouth every other day.  30 tablet  2  . hydrALAZINE (APRESOLINE) 10 MG tablet Take 1 tablet (10 mg total) by mouth every 8 (eight) hours.  90 tablet  2  . hydrOXYzine (ATARAX/VISTARIL) 25 MG tablet Take 25 mg by mouth 2 (two) times daily as needed.      . insulin aspart (NOVOLOG) 100 UNIT/ML injection Sliding Scale Insulin 0-15 Units, Subcutaneous, 3 times daily with meals Correction coverage: Moderate CBG < 70: implement hypoglycemia protocol CBG 70 - 120: 0 units CBG 121 - 150: 2 units CBG 151 - 200: 3 units CBG 201 - 250: 5 units CBG 251 - 300: 8 units CBG 301 - 350: 11 units CBG 351 - 400: 15 units CBG > 400: call MD      . Iron Combinations (IRON COMPLEX PO) Take 1 tablet by mouth daily.      . isosorbide dinitrate (ISORDIL) 10 MG tablet Take 1 tablet (10 mg total) by  mouth every 8 (eight) hours.  90 tablet  2  . montelukast (SINGULAIR) 10 MG tablet Take 1 tablet (10 mg total) by mouth at bedtime.  30 tablet  0  . NON FORMULARY CPAP nightly      . NON FORMULARY Oxygen at 2L/min while out, prn other times.      Marland Kitchen omeprazole (PRILOSEC) 40 MG capsule Take 40 mg by mouth daily.      . potassium chloride SA (K-DUR,KLOR-CON) 20 MEQ tablet Take 2 tablets (40 mEq total) by mouth 2 (two) times daily.  120 tablet  2  . rOPINIRole (REQUIP) 1 MG tablet Take 2 tablets (2 mg total) by mouth at bedtime.      . torsemide (DEMADEX) 20 MG tablet Take 3 tablets (60 mg total) by mouth daily.  90 tablet  2  . traZODone (DESYREL) 150 MG tablet Take 1/4 tab by mouth at bedtime      . fluticasone (FLONASE) 50 MCG/ACT nasal spray Place 2 sprays into the nose daily.      . Fluticasone-Salmeterol (ADVAIR) 250-50 MCG/DOSE AEPB Inhale 1 puff into the lungs every 12 (twelve) hours.       No current facility-administered medications for this visit.    Past Medical History  Diagnosis  Date  . CAD (coronary artery disease)     a. CABG 2001. b. PCI 2011. c. NSTEMI 12/13-03/2012 in prolonged hosp stay, felt demand isch with stable cath.  . Hypertension   . Atrial fibrillation     a. Diagnosed 02/2012 - long hosp stay, difficult to control rates, LAA thrombus noted, needed emergent DCCV for VT/afib/hypotension 04/14/2012.  . Depression   . COPD (chronic obstructive pulmonary disease)   . Sleep apnea     CPAP qhs  . Diabetes mellitus   . Chronic systolic CHF (congestive heart failure)     a. EF 40% 2011, down to 25% 03/2012 felt mixed isch/nonischemic (tachy-mediated).  . Chronic kidney disease     a. AKI 03/2012 felt cardiorenal & also due to hypotension.  . H/O hiatal hernia   . Headache   . Arthritis   . Anemia   . Stroke     a. Stroke 02/2012 with recurrent CVA in-hospital 03/2012 felt embolic.  . Thrombus of left atrial appendage     a. Possible LAA thrombus on TEE 04/01/12.  .  Morbid obesity   . On home oxygen therapy   . VT (ventricular tachycardia)     a. 03/2012.  . Thrombocytopenia   . Restless leg     Past Surgical History  Procedure Laterality Date  . Coronary artery bypass graft  2001    x 4 vessels  . Abdominal hysterectomy    . ;    . Rhinosplasty      x 2  . Tonsillectomy    . Knee replacemnt  2000    total right  . Shoulder sx  2000    right bone spurs  . Cardiac catheterization  2013  . Tee without cardioversion  04/01/2012    Procedure: TRANSESOPHAGEAL ECHOCARDIOGRAM (TEE);  Surgeon: Vesta Mixer, MD;  Location: [facility] ENDOSCOPY;  Service: Cardiovascular;  Laterality: N/A;    History   Social History  . Marital Status: Single    Spouse Name: N/A    Number of Children: N/A  . Years of Education: N/A   Occupational History  . Not on file.   Social History Main Topics  . Smoking status: Former Smoker    Quit date: 04/01/1975  . Smokeless tobacco: Not on file  . Alcohol Use: No  . Drug Use: No  . Sexually Active: Not on file   Other Topics Concern  . Not on file   Social History Narrative  . No narrative on file    No family history on file.  ROS: no nausea, vomiting; no fever, chills; no melena, hematochezia; no claudication  PHYSICAL EXAM: BP 134/66  Pulse 50  Ht 5\' 6"  (1.676 m)  Wt 260 lb 1.6 oz (117.981 kg)  BMI 42 kg/m2  SpO2 98% GENERAL: 67 year old female, obese; NAD HEENT: NCAT, PERRLA, EOMI; sclera clear; no xanthelasma NECK: palpable bilateral carotid pulses, no bruits; no JVD; no TM LUNGS: CTA bilaterally CARDIAC: RRR (S1, S2); no significant murmurs; no rubs or gallops ABDOMEN: soft, non-tender; intact BS EXTREMETIES: Trace-1+ bilateral, nonpitting peripheral edema SKIN: warm/dry; no obvious rash/lesions MUSCULOSKELETAL: no joint deformity NEURO: no focal deficit; NL affect   EKG:    ASSESSMENT & PLAN:  Chronic systolic heart failure Euvolemic by history and exam. She has diuresed 13 pounds  since last OV. Will request most recent routine labs from primary MDs office. Continue current diuretic regimen.  Ischemic cardiomyopathy Unable to further uptitrate carvedilol, secondary to resting bradycardia. Continue  combination regimen of hydralazine/long-acting nitrates, given underlying CKD. Will order followup echocardiogram in 2 months, for reassessment of LVF. If EF at/below 35%, recommend considering referral to EP for possible ICD implantation.  CVA (cerebral infarction) Followed by Dr. Pearlean Brownie, with whom she is scheduled to follow next month.  Atrial fibrillation Maintaining NSR by exam, and recently documented EKG. Continue amiodarone 200 mg daily, but will decrease digoxin at half dose to 0.0625 mg daily, secondary to resting bradycardia. On Pradaxa anticoagulation, as recommended by neurology during recent hospitalization with recurrent stroke, presumed embolic, in setting of LAA thrombus. Of note, patient will also require amiodarone surveillance labs in the future, with LFTs, TSH.    Gene Serpe, PAC

## 2012-06-02 NOTE — Assessment & Plan Note (Addendum)
Unable to further uptitrate carvedilol, secondary to resting bradycardia. Continue combination regimen of hydralazine/long-acting nitrates, given underlying CKD. Will order followup echocardiogram in 2 months, for reassessment of LVF. If EF at/below 35%, recommend considering referral to EP for possible ICD implantation.

## 2012-06-02 NOTE — Patient Instructions (Addendum)
   After finishing Amiodarone 400mg  tablet, change to 200mg  tablet - to be taken one tab daily  Decrease Digoxin to 1/2 of previous dose (0.0625mg ) daily Continue all other current medications. Echo - to be done just prior to next office visit Follow up in  2 months

## 2012-06-02 NOTE — Assessment & Plan Note (Signed)
Maintaining NSR by exam, and recently documented EKG. Continue amiodarone 200 mg daily. On Pradaxa anticoagulation, as recommended by neurology during recent hospitalization with recurrent stroke, presumed embolic, in setting of LAA thrombus. Of note, patient will also require amiodarone surveillance labs in the future, with LFTs, TSH.

## 2012-06-29 ENCOUNTER — Ambulatory Visit (INDEPENDENT_AMBULATORY_CARE_PROVIDER_SITE_OTHER): Payer: Medicare Other | Admitting: Neurology

## 2012-06-29 VITALS — BP 135/75 | HR 62 | Temp 98.7°F

## 2012-06-29 DIAGNOSIS — I513 Intracardiac thrombosis, not elsewhere classified: Secondary | ICD-10-CM

## 2012-06-29 DIAGNOSIS — I5189 Other ill-defined heart diseases: Secondary | ICD-10-CM

## 2012-06-29 NOTE — Progress Notes (Signed)
HPI: Ms Puller is a 67 year old Caucasian lady who is seen for followup following hospital admission for stroke in December 2013. She was admitted on 03/25/29 in in transfer from [facility] where she was admitted initially with a cerebellar stroke. She was found to have low ejection fraction and new onset of atrial fibrillation. She was transferred to [facility] on 03/25/12 for further management. She was initially started on Coumadin which was held due to transition to IV heparin in preparation for heart catheterization. She developed significant worsening of Atrial fibrillation and rate control requiring direct cardioversion. TEE on 04/01/12 showed a small thrombus at the tip of the atrial appendage. She  had been on warfarin with INR 1.8 when she developed another stroke on 04/03/12 with acute change in mental status, dysarthria nausea and decreased responsiveness. She could not get TPA due to recent stroke and anticoagulation. She was subsequently changed to pradaxa. Her hospital course was complicated with several episodes of congestive heart failure, rapid atrial fibrillation and decompensation. She also had a few episodes of ventricle tachycardia and as stated above required emergency cardioversion. Her condition gradually stabilized and she was transferred for rehabilitation to nursing home for 20 days and has gradually done well. She is now at home. She states she has very poor appetite and food does not taste good and she's lost a lot of weight. She is also unable to sleep despite taking 15 mg of temazepam. She is able to ambulate at home with a walker with a seat but states her stamina is not good and she gets tired easily. She has also had frequent falls and balance is not good. She is tolerating pradaxa well without significant bleeding but does have easy bruisability of her skin. She states her fasting sugars have ranged from 110 to 130s and a blood pressure is well controlled and it is  138/75 in office today. ROS: 14 system review of systems is positive for weight loss, fatigue, ringing in the ears, trouble swallowing, most, shortness of breath, incontinence, any me a, bruising, increased thirst, joint pain and cramps, aching muscles, allergies, runny nose, insomnia, weakness, dysphagia, tremor, anxiety depression, not enough sleep, change in appetite. Physical Exam General: well developed, well nourished, seated, in no evident distress Head: head normocephalic and atraumatic. Orohparynx benign Neck: supple with no carotid or supraclavicular bruits Cardiovascular: regular rate and rhythm, no murmurs Musculoskeletal Obese Skin pedal edema 1+. Patient is on home oxygen. She is using her electric wheelchair  Neurologic Exam Mental Status: Awake and fully alert. Oriented to place and time. Recent and remote memory intact. Attention span, concentration and fund of knowledge appropriate. Mood and affect appropriate.  Cranial Nerves: Fundoscopic exam reveals sharp disc margins. Pupils equal, briskly reactive to light. Extraocular movements full without nystagmus. Visual fields full to confrontation. Hearing intact and symmetric to finer snap. Facial sensation intact. Face, tongue, palate move normally and symmetrically. Neck flexion and extension normal.  Motor: Normal bulk and tone. Normal strength in all tested extremity muscles. Sensory.: intact to tough and pinprick and vibratory.  Coordination: Rapid alternating movements normal in all extremities. Finger-to-nose and heel-to-shin performed accurately bilaterally. Gait and Station: Arises from chair without difficulty. Stance Is broad-based. Gait demonstrates Broad-based and mild imbalance .un able to heel, toe and tandem walk without difficulty.  Reflexes: 1+ and symmetric. Toes downgoing.     ASSESSMENT:: 67 year old lady with cerebellar infarct in December 2013 secondary to cardiogenic embolism from left atrial thrombus and  new  onset atrial fibrillation. Multiple vascular risk factors of hypertension, diabetes, sleep apnea,, congestive heart failure with low ejection fraction, Atrial fibrillation and left atrial thrombus.    PLAN: Continue Pradaxa for secondary stroke prevention and strict control of his factors with blood pressure goal below 120/80, diabetes goal of hemoglobin A1c below 6.5%, lipids goal of ADL below 100 mg percent. Followup with my nurse practitioner in 4 months.

## 2012-06-29 NOTE — Patient Instructions (Signed)
He was advised to continue Xopenex the floor stroke prevention for her atrial fibrillation and maintain strict control of diabetes with hemoglobin A1c goal below 6.5%, lipids were LDL cholesterol goal below 70 mg percent and hypertension with blood pressure goal below 120/80. She was advised to return for followup in 4 months with Darrol Angel, nurse practitioner

## 2012-08-05 ENCOUNTER — Other Ambulatory Visit: Payer: Medicare Other

## 2012-08-10 ENCOUNTER — Ambulatory Visit: Payer: Medicare Other | Admitting: Cardiology

## 2012-08-11 ENCOUNTER — Other Ambulatory Visit: Payer: Self-pay

## 2012-08-11 ENCOUNTER — Other Ambulatory Visit (INDEPENDENT_AMBULATORY_CARE_PROVIDER_SITE_OTHER): Payer: Medicare Other

## 2012-08-11 DIAGNOSIS — I255 Ischemic cardiomyopathy: Secondary | ICD-10-CM

## 2012-08-11 DIAGNOSIS — I4891 Unspecified atrial fibrillation: Secondary | ICD-10-CM

## 2012-08-11 DIAGNOSIS — I5022 Chronic systolic (congestive) heart failure: Secondary | ICD-10-CM

## 2012-08-11 DIAGNOSIS — I2589 Other forms of chronic ischemic heart disease: Secondary | ICD-10-CM

## 2012-08-13 ENCOUNTER — Other Ambulatory Visit: Payer: Self-pay | Admitting: *Deleted

## 2012-08-13 DIAGNOSIS — R931 Abnormal findings on diagnostic imaging of heart and coronary circulation: Secondary | ICD-10-CM

## 2012-08-16 ENCOUNTER — Encounter: Payer: Self-pay | Admitting: Cardiology

## 2012-08-16 DIAGNOSIS — R9430 Abnormal result of cardiovascular function study, unspecified: Secondary | ICD-10-CM | POA: Insufficient documentation

## 2012-08-16 DIAGNOSIS — F329 Major depressive disorder, single episode, unspecified: Secondary | ICD-10-CM | POA: Insufficient documentation

## 2012-08-16 DIAGNOSIS — Z8719 Personal history of other diseases of the digestive system: Secondary | ICD-10-CM | POA: Insufficient documentation

## 2012-08-16 DIAGNOSIS — I480 Paroxysmal atrial fibrillation: Secondary | ICD-10-CM | POA: Insufficient documentation

## 2012-08-16 DIAGNOSIS — R519 Headache, unspecified: Secondary | ICD-10-CM | POA: Insufficient documentation

## 2012-08-16 DIAGNOSIS — G2581 Restless legs syndrome: Secondary | ICD-10-CM | POA: Insufficient documentation

## 2012-08-16 DIAGNOSIS — D696 Thrombocytopenia, unspecified: Secondary | ICD-10-CM | POA: Insufficient documentation

## 2012-08-16 DIAGNOSIS — I255 Ischemic cardiomyopathy: Secondary | ICD-10-CM | POA: Insufficient documentation

## 2012-08-16 DIAGNOSIS — Z9981 Dependence on supplemental oxygen: Secondary | ICD-10-CM | POA: Insufficient documentation

## 2012-08-16 DIAGNOSIS — E119 Type 2 diabetes mellitus without complications: Secondary | ICD-10-CM | POA: Insufficient documentation

## 2012-08-16 DIAGNOSIS — M1990 Unspecified osteoarthritis, unspecified site: Secondary | ICD-10-CM | POA: Insufficient documentation

## 2012-08-16 DIAGNOSIS — N189 Chronic kidney disease, unspecified: Secondary | ICD-10-CM | POA: Insufficient documentation

## 2012-08-16 DIAGNOSIS — I2510 Atherosclerotic heart disease of native coronary artery without angina pectoris: Secondary | ICD-10-CM | POA: Insufficient documentation

## 2012-08-16 DIAGNOSIS — G4730 Sleep apnea, unspecified: Secondary | ICD-10-CM | POA: Insufficient documentation

## 2012-08-16 DIAGNOSIS — I639 Cerebral infarction, unspecified: Secondary | ICD-10-CM | POA: Insufficient documentation

## 2012-08-16 DIAGNOSIS — I272 Pulmonary hypertension, unspecified: Secondary | ICD-10-CM | POA: Insufficient documentation

## 2012-08-16 DIAGNOSIS — I513 Intracardiac thrombosis, not elsewhere classified: Secondary | ICD-10-CM | POA: Insufficient documentation

## 2012-08-16 DIAGNOSIS — J441 Chronic obstructive pulmonary disease with (acute) exacerbation: Secondary | ICD-10-CM | POA: Insufficient documentation

## 2012-08-16 DIAGNOSIS — R51 Headache: Secondary | ICD-10-CM | POA: Insufficient documentation

## 2012-08-16 DIAGNOSIS — I472 Ventricular tachycardia: Secondary | ICD-10-CM | POA: Insufficient documentation

## 2012-08-16 DIAGNOSIS — I10 Essential (primary) hypertension: Secondary | ICD-10-CM | POA: Insufficient documentation

## 2012-08-19 ENCOUNTER — Encounter: Payer: Self-pay | Admitting: Cardiology

## 2012-08-19 ENCOUNTER — Ambulatory Visit (INDEPENDENT_AMBULATORY_CARE_PROVIDER_SITE_OTHER): Payer: Medicare Other | Admitting: Cardiology

## 2012-08-19 VITALS — BP 132/76 | HR 56 | Ht 66.5 in | Wt 268.0 lb

## 2012-08-19 DIAGNOSIS — I509 Heart failure, unspecified: Secondary | ICD-10-CM

## 2012-08-19 DIAGNOSIS — I2589 Other forms of chronic ischemic heart disease: Secondary | ICD-10-CM

## 2012-08-19 DIAGNOSIS — I255 Ischemic cardiomyopathy: Secondary | ICD-10-CM

## 2012-08-19 DIAGNOSIS — I5022 Chronic systolic (congestive) heart failure: Secondary | ICD-10-CM

## 2012-08-19 DIAGNOSIS — I639 Cerebral infarction, unspecified: Secondary | ICD-10-CM

## 2012-08-19 DIAGNOSIS — R61 Generalized hyperhidrosis: Secondary | ICD-10-CM

## 2012-08-19 DIAGNOSIS — I6350 Cerebral infarction due to unspecified occlusion or stenosis of unspecified cerebral artery: Secondary | ICD-10-CM

## 2012-08-19 DIAGNOSIS — I2510 Atherosclerotic heart disease of native coronary artery without angina pectoris: Secondary | ICD-10-CM

## 2012-08-19 DIAGNOSIS — IMO0001 Reserved for inherently not codable concepts without codable children: Secondary | ICD-10-CM

## 2012-08-19 DIAGNOSIS — Z9981 Dependence on supplemental oxygen: Secondary | ICD-10-CM

## 2012-08-19 MED ORDER — HYDRALAZINE HCL 10 MG PO TABS: 20.0000 mg | ORAL_TABLET | Freq: Three times a day (TID) | ORAL | Status: DC

## 2012-08-19 NOTE — Assessment & Plan Note (Signed)
At this point the etiology of her sweating spells is not clear. I'm not convinced that it is a cardiac symptom but we'll follow this very carefully.

## 2012-08-19 NOTE — Assessment & Plan Note (Signed)
She's wearing her home O2 at this time.

## 2012-08-19 NOTE — Assessment & Plan Note (Signed)
We will continue to watch her progress from recovery from her CVA.

## 2012-08-19 NOTE — Assessment & Plan Note (Signed)
She has an ischemic cardiomyopathy. Her ejection fraction repeat is still in the 30% range. She knows that there will be consideration of ICD. However I feel we should not yet proceed with this. Her hydralazine dose will be increased in all see her for followup.

## 2012-08-19 NOTE — Assessment & Plan Note (Signed)
She had a non-STEMI at the time of her CVA in December, 2013. Catheterization showed that her grafts were patent and the stent to her circumflex was patent. She had an ejection fraction at that time it was significantly reduced. Medical therapy was recommended.

## 2012-08-19 NOTE — Progress Notes (Signed)
HPI  Patient is seen today to followup coronary disease and cardiomyopathy. She was seen last in the office June 02, 2012 by Mr. Serpe. She was stable at that time. Decision was made to arrange for a followup echo. She has had neurology followup. Echo was done Aug 11, 2012. Her ejection fraction was estimated at 30%. There is pulmonary hypertension.  She is now seen in the back in the office for cardiology followup. She's not had any chest pain. When she tries to do much she has excess sweating. She has not had any syncope or presyncope. 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  Allergies  Allergen Reactions  . Penicillins Anaphylaxis  . Azithromycin Hives and Rash  . Bactrim (Sulfamethoxazole W-Trimethoprim) Hives and Rash  . Sulfa Antibiotics Hives and Rash    Current Outpatient Prescriptions  Medication Sig Dispense Refill  . amiodarone (PACERONE) 200 MG tablet Take 1 tablet (200 mg total) by mouth daily.  30 tablet  6  . atorvastatin (LIPITOR) 10 MG tablet Take 1 tablet (10 mg total) by mouth daily.  30 tablet  2  . carvedilol (COREG) 6.25 MG tablet Take 1 tablet (6.25 mg total) by mouth 2 (two) times daily with a meal.      . dabigatran (PRADAXA) 75 MG CAPS Take 75 mg by mouth daily.      . digoxin (LANOXIN) 0.125 MG tablet Take 0.5 tablets (0.0625 mg total) by mouth daily.  15 tablet  6  . Fe Fum-FA-B Cmp-C-Zn-Mg-Mn-Cu (HEMATINIC PLUS COMPLEX) 106-1 MG TABS Take by mouth daily.      . fluticasone (FLONASE) 50 MCG/ACT nasal spray Place 2 sprays into the nose daily.      . Fluticasone-Salmeterol (ADVAIR) 250-50 MCG/DOSE AEPB Inhale 1 puff into the lungs every 12 (twelve) hours.      Marland Kitchen HYDROcodone-acetaminophen (NORCO) 10-325 MG per tablet Take 1 tablet by mouth every 6 (six) hours as needed for pain.      Marland Kitchen insulin aspart (NOVOLOG) 100 UNIT/ML injection Sliding Scale Insulin 0-15 Units, Subcutaneous, 3 times daily with meals Correction coverage: Moderate CBG < 70: implement  hypoglycemia protocol CBG 70 - 120: 0 units CBG 121 - 150: 2 units CBG 151 - 200: 3 units CBG 201 - 250: 5 units CBG 251 - 300: 8 units CBG 301 - 350: 11 units CBG 351 - 400: 15 units CBG > 400: call MD      . Iron Combinations (IRON COMPLEX PO) Take 1 tablet by mouth daily.      . isosorbide dinitrate (ISORDIL) 10 MG tablet Take 1 tablet (10 mg total) by mouth every 8 (eight) hours.  90 tablet  2  . montelukast (SINGULAIR) 10 MG tablet Take 1 tablet (10 mg total) by mouth at bedtime.  30 tablet  0  . NON FORMULARY CPAP nightly      . NON FORMULARY Oxygen at 2L/min while out, prn other times.      Marland Kitchen omeprazole (PRILOSEC) 40 MG capsule Take 40 mg by mouth daily.      . potassium chloride SA (K-DUR,KLOR-CON) 20 MEQ tablet Take 2 tablets (40 mEq total) by mouth 2 (two) times daily.  120 tablet  2  . rOPINIRole (REQUIP) 1 MG tablet Take 2 tablets (2 mg total) by mouth at bedtime.      . sertraline (ZOLOFT) 50 MG tablet Take 50 mg by mouth daily.      Marland Kitchen torsemide (DEMADEX) 20 MG tablet Take  3 tablets (60 mg total) by mouth daily.  90 tablet  2  . hydrALAZINE (APRESOLINE) 10 MG tablet Take 2 tablets (20 mg total) by mouth 3 (three) times daily.  180 tablet  1   No current facility-administered medications for this visit.    History   Social History  . Marital Status: Single    Spouse Name: N/A    Number of Children: N/A  . Years of Education: N/A   Occupational History  . Not on file.   Social History Main Topics  . Smoking status: Former Smoker    Quit date: 04/01/1975  . Smokeless tobacco: Not on file  . Alcohol Use: No  . Drug Use: No  . Sexually Active: Not on file   Other Topics Concern  . Not on file   Social History Narrative  . No narrative on file    No family history on file.  Past Medical History  Diagnosis Date  . CAD (coronary artery disease)     a. CABG 2001. b. PCI 2011. c. NSTEMI 12/13-03/2012 in prolonged hosp stay, felt demand isch with stable cath.    . Hypertension   . Paroxysmal atrial fibrillation     a. Diagnosed 02/2012 - long hosp stay, difficult to control rates, LAA thrombus noted, needed emergent DCCV for VT/afib/hypotension 04/14/2012.  . Depression   . COPD (chronic obstructive pulmonary disease)   . Sleep apnea     CPAP qhs  . Diabetes mellitus   . Chronic systolic CHF (congestive heart failure)     a. EF 40% 2011, down to 25% 03/2012 felt mixed isch/nonischemic (tachy-mediated).  . Chronic kidney disease     a. AKI 03/2012 felt cardiorenal & also due to hypotension.  . H/O hiatal hernia   . Headache   . Arthritis   . Anemia   . Stroke     a. Stroke 02/2012 with recurrent CVA in-hospital 03/2012 felt embolic.  . Thrombus of left atrial appendage     a. Possible LAA thrombus on TEE 04/01/12.  . Morbid obesity   . On home oxygen therapy   . VT (ventricular tachycardia)     a. 03/2012.  . Thrombocytopenia   . Restless leg   . CVA (cerebral infarction)     Followed by Dr. Pearlean Brownie  . Hx of amiodarone therapy     Atrial fibrillation  . HX: anticoagulation     Atrial fibrillation  . Cardiomyopathy, ischemic   . Ejection fraction     EF 40% in the past   //    EF 25% January, 2014   //   EF 30%, echo, Aug 11, 2012, mid/apical anterior akinesis and anteroseptal akinesis and apical lateral akinesis and akinesis of the true apex.  . Pulmonary hypertension     PA pressure 65 mmHg, echo, Aug 11, 2012  . CKD (chronic kidney disease)     Past Surgical History  Procedure Laterality Date  . Coronary artery bypass graft  2001    x 4 vessels  . Abdominal hysterectomy    . ;    . Rhinosplasty      x 2  . Tonsillectomy    . Knee replacemnt  2000    total right  . Shoulder sx  2000    right bone spurs  . Cardiac catheterization  2013  . Tee without cardioversion  04/01/2012    Procedure: TRANSESOPHAGEAL ECHOCARDIOGRAM (TEE);  Surgeon: Vesta Mixer, MD;  Location: [facility] ENDOSCOPY;  Service: Cardiovascular;  Laterality: N/A;     Patient Active Problem List   Diagnosis Date Noted  . Arthritis   . Stroke   . Thrombus of left atrial appendage   . Morbid obesity   . On home oxygen therapy   . VT (ventricular tachycardia)   . Thrombocytopenia   . Restless leg   . CAD (coronary artery disease)   . Hypertension   . Paroxysmal atrial fibrillation   . Depression   . COPD (chronic obstructive pulmonary disease)   . Sleep apnea   . Diabetes mellitus   . Chronic systolic CHF (congestive heart failure)   . Chronic kidney disease   . H/O hiatal hernia   . Headache   . Ejection fraction   . Pulmonary hypertension   . Cardiomyopathy, ischemic   . CKD (chronic kidney disease)   . Chronic systolic heart failure 06/02/2012  . Ischemic cardiomyopathy 04/26/2012  . Anemia 03/26/2012    ROS   Patient denies fever, chills, headache, sweats, rash, change in vision, change in hearing, chest pain, cough, nausea vomiting, urinary symptoms. All other systems are reviewed and are negative.  PHYSICAL EXAM  Patient is overweight. She is in an Art gallery manager. She is oriented to person time and place. Affect is normal. There is no jugulovenous distention. Lungs are clear. Respiratory effort is nonlabored. Cardiac exam reveals S1 and S2. There no clicks or significant murmurs. The abdomen is soft. There is trace peripheral edema.  Filed Vitals:   08/19/12 1007  BP: 132/76  Pulse: 56  Height: 5' 6.5" (1.689 m)  Weight: 268 lb (121.564 kg)     ASSESSMENT & PLAN

## 2012-08-19 NOTE — Patient Instructions (Addendum)
Your physician recommends that you schedule a follow-up appointment in: 8 weeks. Your physician has recommended you make the following change in your medication: Increase your hydralazine to 20 mg three times daily. Please take (2) of your 10 mg tablets three times daily. Your new prescription has been sent to your pharmacy. All other medications will remain the same.

## 2012-08-19 NOTE — Assessment & Plan Note (Signed)
Her volume status is reasonably controlled at this time. No change in therapy.

## 2012-10-14 ENCOUNTER — Ambulatory Visit: Payer: Medicare Other | Admitting: Cardiology

## 2012-10-19 ENCOUNTER — Other Ambulatory Visit: Payer: Self-pay | Admitting: *Deleted

## 2012-10-19 MED ORDER — HYDRALAZINE HCL 10 MG PO TABS: 20.0000 mg | ORAL_TABLET | Freq: Three times a day (TID) | ORAL | Status: DC

## 2012-11-03 ENCOUNTER — Encounter: Payer: Self-pay | Admitting: Physician Assistant

## 2012-11-03 ENCOUNTER — Ambulatory Visit: Payer: Medicare Other | Admitting: Physician Assistant

## 2012-11-03 ENCOUNTER — Other Ambulatory Visit: Payer: Self-pay

## 2012-11-03 ENCOUNTER — Ambulatory Visit (INDEPENDENT_AMBULATORY_CARE_PROVIDER_SITE_OTHER): Payer: Medicare Other | Admitting: Physician Assistant

## 2012-11-03 VITALS — BP 152/77 | HR 58 | Ht 66.5 in | Wt 267.0 lb

## 2012-11-03 DIAGNOSIS — I480 Paroxysmal atrial fibrillation: Secondary | ICD-10-CM

## 2012-11-03 DIAGNOSIS — R931 Abnormal findings on diagnostic imaging of heart and coronary circulation: Secondary | ICD-10-CM

## 2012-11-03 DIAGNOSIS — I255 Ischemic cardiomyopathy: Secondary | ICD-10-CM

## 2012-11-03 DIAGNOSIS — G4730 Sleep apnea, unspecified: Secondary | ICD-10-CM

## 2012-11-03 DIAGNOSIS — R9439 Abnormal result of other cardiovascular function study: Secondary | ICD-10-CM

## 2012-11-03 DIAGNOSIS — I509 Heart failure, unspecified: Secondary | ICD-10-CM

## 2012-11-03 DIAGNOSIS — I2589 Other forms of chronic ischemic heart disease: Secondary | ICD-10-CM

## 2012-11-03 DIAGNOSIS — I5022 Chronic systolic (congestive) heart failure: Secondary | ICD-10-CM

## 2012-11-03 DIAGNOSIS — I4891 Unspecified atrial fibrillation: Secondary | ICD-10-CM

## 2012-11-03 MED ORDER — ISOSORBIDE DINITRATE 20 MG PO TABS: 20.0000 mg | ORAL_TABLET | Freq: Three times a day (TID) | ORAL | Status: DC

## 2012-11-03 NOTE — Progress Notes (Signed)
Primary Cardiologist: Caitlyn Bonito, MD   HPI: Scheduled 2 month followup.  She returns today reporting that overall increase in her exercise tolerance capacity. She is able to walk twice daily with a rolling walker. She is reporting a stable weights within 5 pounds, and refrains from added salt in her diet. She sleeps on 2 pillows, and is compliant with CPAP.  Patient also suggests chronic stable angina pattern, experiencing sharp CP a few times a week, with/without activity. She occasionally uses 1 NTG tablet, with prompt relief. She died denies any recent acceleration of this pattern.  She has diuresed 1 pound since last OV.  Allergies  Allergen Reactions  . Penicillins Anaphylaxis  . Azithromycin Hives and Rash  . Bactrim (Sulfamethoxazole W-Trimethoprim) Hives and Rash  . Sulfa Antibiotics Hives and Rash    Current Outpatient Prescriptions  Medication Sig Dispense Refill  . amiodarone (PACERONE) 200 MG tablet Take 1 tablet (200 mg total) by mouth daily.  30 tablet  6  . atorvastatin (LIPITOR) 10 MG tablet Take 1 tablet (10 mg total) by mouth daily.  30 tablet  2  . carvedilol (COREG) 6.25 MG tablet Take 1 tablet (6.25 mg total) by mouth 2 (two) times daily with a meal.      . dabigatran (PRADAXA) 75 MG CAPS Take 75 mg by mouth daily.      . digoxin (LANOXIN) 0.125 MG tablet Take 0.5 tablets (0.0625 mg total) by mouth daily.  15 tablet  6  . Fe Fum-FA-B Cmp-C-Zn-Mg-Mn-Cu (HEMATINIC PLUS COMPLEX) 106-1 MG TABS Take by mouth daily.      . fluticasone (FLONASE) 50 MCG/ACT nasal spray Place 2 sprays into the nose daily.      . Fluticasone-Salmeterol (ADVAIR) 250-50 MCG/DOSE AEPB Inhale 1 puff into the lungs every 12 (twelve) hours.      . hydrALAZINE (APRESOLINE) 10 MG tablet Take 2 tablets (20 mg total) by mouth 3 (three) times daily.  180 tablet  0  . HYDROcodone-acetaminophen (NORCO) 10-325 MG per tablet Take 1 tablet by mouth every 6 (six) hours as needed for pain.      Marland Kitchen insulin  aspart (NOVOLOG) 100 UNIT/ML injection Sliding Scale Insulin 0-15 Units, Subcutaneous, 3 times daily with meals Correction coverage: Moderate CBG < 70: implement hypoglycemia protocol CBG 70 - 120: 0 units CBG 121 - 150: 2 units CBG 151 - 200: 3 units CBG 201 - 250: 5 units CBG 251 - 300: 8 units CBG 301 - 350: 11 units CBG 351 - 400: 15 units CBG > 400: call MD      . Iron Combinations (IRON COMPLEX PO) Take 1 tablet by mouth daily.      . isosorbide dinitrate (ISORDIL) 20 MG tablet Take 1 tablet (20 mg total) by mouth every 8 (eight) hours.  90 tablet  6  . montelukast (SINGULAIR) 10 MG tablet Take 1 tablet (10 mg total) by mouth at bedtime.  30 tablet  0  . NON FORMULARY CPAP nightly      . NON FORMULARY Oxygen at 2L/min while out, prn other times.      Marland Kitchen omeprazole (PRILOSEC) 40 MG capsule Take 40 mg by mouth daily.      . potassium chloride SA (K-DUR,KLOR-CON) 20 MEQ tablet Take 2 tablets (40 mEq total) by mouth 2 (two) times daily.  120 tablet  2  . rOPINIRole (REQUIP) 1 MG tablet Take 2 tablets (2 mg total) by mouth at bedtime.      . sertraline (  ZOLOFT) 50 MG tablet Take 50 mg by mouth daily.      Marland Kitchen torsemide (DEMADEX) 20 MG tablet Take 3 tablets (60 mg total) by mouth daily.  90 tablet  2   No current facility-administered medications for this visit.    Past Medical History  Diagnosis Date  . CAD (coronary artery disease)     a. CABG 2001. b. PCI 2011. c. NSTEMI 12/13-03/2012 in prolonged hosp stay, felt demand isch with stable cath.  . Hypertension   . Paroxysmal atrial fibrillation     a. Diagnosed 02/2012 - long hosp stay, difficult to control rates, LAA thrombus noted, needed emergent DCCV for VT/afib/hypotension 04/14/2012.  . Depression   . COPD (chronic obstructive pulmonary disease)   . Sleep apnea     CPAP qhs  . Diabetes mellitus   . Chronic systolic CHF (congestive heart failure)     a. EF 40% 2011, down to 25% 03/2012 felt mixed isch/nonischemic  (tachy-mediated).  . Chronic kidney disease     a. AKI 03/2012 felt cardiorenal & also due to hypotension.  . H/O hiatal hernia   . Headache(784.0)   . Arthritis   . Anemia   . Stroke     a. Stroke 02/2012 with recurrent CVA in-hospital 03/2012 felt embolic.  . Thrombus of left atrial appendage     a. Possible LAA thrombus on TEE 04/01/12.  . Morbid obesity   . On home oxygen therapy   . VT (ventricular tachycardia)     a. 03/2012.  . Thrombocytopenia   . Restless leg   . CVA (cerebral infarction)     Followed by Dr. Pearlean Hardy  . Hx of amiodarone therapy     Atrial fibrillation  . HX: anticoagulation     Atrial fibrillation  . Cardiomyopathy, ischemic   . Ejection fraction     EF 40% in the past   //    EF 25% January, 2014   //   EF 30%, echo, Aug 11, 2012, mid/apical anterior akinesis and anteroseptal akinesis and apical lateral akinesis and akinesis of the true apex.  . Pulmonary hypertension     PA pressure 65 mmHg, echo, Aug 11, 2012  . CKD (chronic kidney disease)   . Sweating     May, 2014    Past Surgical History  Procedure Laterality Date  . Coronary artery bypass graft  2001    x 4 vessels  . Abdominal hysterectomy    . ;    . Rhinosplasty      x 2  . Tonsillectomy    . Knee replacemnt  2000    total right  . Shoulder sx  2000    right bone spurs  . Cardiac catheterization  2013  . Tee without cardioversion  04/01/2012    Procedure: TRANSESOPHAGEAL ECHOCARDIOGRAM (TEE);  Surgeon: Caitlyn Mixer, MD;  Location: [facility] ENDOSCOPY;  Service: Cardiovascular;  Laterality: N/A;    History   Social History  . Marital Status: Single    Spouse Name: N/A    Number of Children: N/A  . Years of Education: N/A   Occupational History  . Not on file.   Social History Main Topics  . Smoking status: Former Smoker    Quit date: 04/01/1975  . Smokeless tobacco: Not on file  . Alcohol Use: No  . Drug Use: No  . Sexually Active: Not on file   Other Topics Concern  . Not  on file   Social History  Narrative  . No narrative on file    No family history on file.  ROS: no nausea, vomiting; no fever, chills; no melena, hematochezia; no claudication  PHYSICAL EXAM: BP 152/77  Pulse 58  Ht 5' 6.5" (1.689 m)  Wt 267 lb (121.11 kg)  BMI 42.45 kg/m2 GENERAL: 67 year old female, slightly obese, sitting in wheelchair; NAD HEENT: NCAT, PERRLA, EOMI; sclera clear; no xanthelasma NECK: No carotid bruits; JVD noted on the right, 90 LUNGS: CTA bilaterally CARDIAC: RRR (S1, S2); no significant murmurs; no rubs or gallops ABDOMEN: soft, protuberant EXTREMETIES: 2-3+ bilateral, nonpitting peripheral edema SKIN: warm/dry; no obvious rash/lesions MUSCULOSKELETAL: no joint deformity NEURO: no focal deficit; NL affect   EKG:    ASSESSMENT & PLAN:  Cardiomyopathy, ischemic Following review with Dr. Myrtis Ser, recommendation is as follows: Patient will be referred to our EP team for formal evaluation regarding possible ICD implantation for primary prophylaxis. She has had documented EF of 30% or lower, since January of this year. Her most recent assessment was by echocardiography in May, with an EF of 30%. We broached this subject today, and she is amenable to proceed. She states that she has discussed this issue with her son, in the intervening period. In the meanwhile, we'll increase isosorbide to 20 mg every 8 hours, for more aggressive suppression of breakthrough angina pectoris. Of note, I am unable to uptitrate carvedilol dose, secondary to resting bradycardia.  Chronic systolic CHF (congestive heart failure) Patient has diuresed 1 pound since last OV, and does not suggest history of decompensated heart failure. She does, however, have significant volume overload with bilateral peripheral edema, on high-dose Demadex regimen. Recommend continuing current diuretic regimen, and reassessing clinical status at next OV.  Sleep apnea Reports compliance with  CPAP  Paroxysmal atrial fibrillation On combination therapy with amiodarone and Pradaxa    Caitlyn Hardy, PAC

## 2012-11-03 NOTE — Patient Instructions (Addendum)
   Referral to EP   Increase Isosorbide Dinitrate to 20mg  every 8 hours - new sent to pharm  Continue all other current medications. Follow up in  3 months

## 2012-11-03 NOTE — Assessment & Plan Note (Addendum)
Following review with Dr. Myrtis Ser, recommendation is as follows: Patient will be referred to our EP team for formal evaluation regarding possible ICD implantation for primary prophylaxis. She has had documented EF of 30% or lower, since January of this year. Her most recent assessment was by echocardiography in May, with an EF of 30%. We broached this subject today, and she is amenable to proceed. She states that she has discussed this issue with her son, in the intervening period. In the meanwhile, we'll increase isosorbide to 20 mg every 8 hours, for more aggressive suppression of breakthrough angina pectoris. Of note, I am unable to uptitrate carvedilol dose, secondary to resting bradycardia.

## 2012-11-03 NOTE — Assessment & Plan Note (Signed)
On combination therapy with amiodarone and Pradaxa

## 2012-11-03 NOTE — Assessment & Plan Note (Signed)
Patient has diuresed 1 pound since last OV, and does not suggest history of decompensated heart failure. She does, however, have significant volume overload with bilateral peripheral edema, on high-dose Demadex regimen. Recommend continuing current diuretic regimen, and reassessing clinical status at next OV.

## 2012-11-03 NOTE — Assessment & Plan Note (Signed)
Reports compliance with CPAP.

## 2012-11-11 ENCOUNTER — Encounter: Payer: Self-pay | Admitting: Nurse Practitioner

## 2012-11-11 ENCOUNTER — Ambulatory Visit (INDEPENDENT_AMBULATORY_CARE_PROVIDER_SITE_OTHER): Payer: Medicare Other | Admitting: Nurse Practitioner

## 2012-11-11 VITALS — BP 134/80

## 2012-11-11 DIAGNOSIS — E119 Type 2 diabetes mellitus without complications: Secondary | ICD-10-CM

## 2012-11-11 DIAGNOSIS — I6350 Cerebral infarction due to unspecified occlusion or stenosis of unspecified cerebral artery: Secondary | ICD-10-CM

## 2012-11-11 DIAGNOSIS — G4730 Sleep apnea, unspecified: Secondary | ICD-10-CM

## 2012-11-11 DIAGNOSIS — I639 Cerebral infarction, unspecified: Secondary | ICD-10-CM

## 2012-11-11 NOTE — Progress Notes (Signed)
Reason for visit for followup  HPI: Ms Houser is a 67 year old Caucasian lady who is seen for followup. Last seen by Dr. Pearlean Brownie 06/29/12  following hospital admission for stroke in December 2013. She was admitted on 03/25/12 transferred  from [facility] where she was admitted initially with a cerebellar stroke. She was found to have low ejection fraction and new onset of atrial fibrillation. She was transferred to [facility] on 03/25/12 for further management. She was initially started on Coumadin which was held due to transition to IV heparin in preparation for heart catheterization. She developed significant worsening of Atrial fibrillation and rate control requiring direct cardioversion. TEE on 04/01/12 showed a small thrombus at the tip of the atrial appendage. She had been on warfarin with INR 1.8 when she developed another stroke on 04/03/12 with acute change in mental status, dysarthria nausea and decreased responsiveness. She could not get TPA due to recent stroke and anticoagulation. She was subsequently changed to pradaxa. Her hospital course was complicated with several episodes of congestive heart failure, rapid atrial fibrillation and decompensation. She also had a few episodes of ventricle tachycardia and as stated above required emergency cardioversion. Her condition gradually stabilized and she was transferred for rehabilitation to nursing home for 20 days and has gradually done well. She is now at home. She states she has very poor appetite and food does not taste good and she's lost a lot of weight. She is also unable to sleep despite taking 15 mg of temazepam. She is able to ambulate at home with a walker with a seat but states her stamina is not good and she gets tired easily. She has also had frequent falls and balance is not good. She is tolerating pradaxa well without significant bleeding but does have easy bruisability of her skin. She states her fasting sugars have ranged from  110 to 130s and a blood pressure is well controlled  11/11/12 returns for followup without further stroke or TIA symptoms. She is tolerating pradaxa well without significant bleeding. She states her fasting sugars are less than 150. Blood pressure today was 134/80. She is using her CPAP at night, and she uses her oxygen when necessary during the day. She continues to tire easily. She is currently living at home with assistance. She has no new neurologic complaints   ROS:  Negative except for weight loss, fatigue, palpitations, swelling in the legs, shortness of breath, diarrhea alternating with constipation, easy bruising, increased thirst, joint pain, allergies, memory loss, depression anxiety, disinterest in activities, and insomnia   Medications Current Outpatient Prescriptions on File Prior to Visit  Medication Sig Dispense Refill  . amiodarone (PACERONE) 200 MG tablet Take 1 tablet (200 mg total) by mouth daily.  30 tablet  6  . atorvastatin (LIPITOR) 10 MG tablet Take 1 tablet (10 mg total) by mouth daily.  30 tablet  2  . carvedilol (COREG) 6.25 MG tablet Take 1 tablet (6.25 mg total) by mouth 2 (two) times daily with a meal.      . dabigatran (PRADAXA) 75 MG CAPS Take 75 mg by mouth daily.      . digoxin (LANOXIN) 0.125 MG tablet Take 0.5 tablets (0.0625 mg total) by mouth daily.  15 tablet  6  . Fe Fum-FA-B Cmp-C-Zn-Mg-Mn-Cu (HEMATINIC PLUS COMPLEX) 106-1 MG TABS Take by mouth daily.      . fluticasone (FLONASE) 50 MCG/ACT nasal spray Place 2 sprays into the nose daily.      Marland Kitchen  Fluticasone-Salmeterol (ADVAIR) 250-50 MCG/DOSE AEPB Inhale 1 puff into the lungs every 12 (twelve) hours.      . hydrALAZINE (APRESOLINE) 10 MG tablet Take 2 tablets (20 mg total) by mouth 3 (three) times daily.  180 tablet  0  . HYDROcodone-acetaminophen (NORCO) 10-325 MG per tablet Take 1 tablet by mouth every 6 (six) hours as needed for pain.      Marland Kitchen insulin aspart (NOVOLOG) 100 UNIT/ML injection Sliding Scale  Insulin 0-15 Units, Subcutaneous, 3 times daily with meals Correction coverage: Moderate CBG < 70: implement hypoglycemia protocol CBG 70 - 120: 0 units CBG 121 - 150: 2 units CBG 151 - 200: 3 units CBG 201 - 250: 5 units CBG 251 - 300: 8 units CBG 301 - 350: 11 units CBG 351 - 400: 15 units CBG > 400: call MD      . Iron Combinations (IRON COMPLEX PO) Take 1 tablet by mouth daily.      . isosorbide dinitrate (ISORDIL) 20 MG tablet Take 1 tablet (20 mg total) by mouth every 8 (eight) hours.  90 tablet  6  . montelukast (SINGULAIR) 10 MG tablet Take 1 tablet (10 mg total) by mouth at bedtime.  30 tablet  0  . NON FORMULARY CPAP nightly      . NON FORMULARY Oxygen at 2L/min while out, prn other times.      Marland Kitchen omeprazole (PRILOSEC) 40 MG capsule Take 40 mg by mouth daily.      . potassium chloride SA (K-DUR,KLOR-CON) 20 MEQ tablet Take 2 tablets (40 mEq total) by mouth 2 (two) times daily.  120 tablet  2  . rOPINIRole (REQUIP) 1 MG tablet Take 2 tablets (2 mg total) by mouth at bedtime.      . sertraline (ZOLOFT) 50 MG tablet Take 50 mg by mouth daily.      Marland Kitchen torsemide (DEMADEX) 20 MG tablet Take 3 tablets (60 mg total) by mouth daily.  90 tablet  2   No current facility-administered medications on file prior to visit.    Allergies  Allergies  Allergen Reactions  . Penicillins Anaphylaxis  . Azithromycin Hives and Rash  . Bactrim [Sulfamethoxazole W-Trimethoprim] Hives and Rash  . Sulfa Antibiotics Hives and Rash    Physical Exam General: well developed, obese female  seated, in no evident distress Head: head normocephalic and atraumatic. Oropharynx benign Neck: supple with no carotid  bruits Cardiovascular: regular rate and rhythm, no murmurs Skin minimal bruising, pedal edema1+.   Neurologic Exam Mental Status: Awake and fully alert. Oriented to place and time. Follows all commands. Speech and language normal.   Cranial Nerves: Fundoscopic exam reveals sharp disc margins.  Pupils equal, briskly reactive to light. Extraocular movements full without nystagmus. Visual fields full to confrontation. Hearing intact and symmetric to finger snap. Facial sensation intact. Face, tongue, palate move normally and symmetrically. Neck flexion and extension normal.  Motor: Normal bulk and tone. Normal strength in all tested extremity muscles.No focal weakness Sensory.: intact to touch and pinprick and vibratory.  Coordination: Rapid alternating movements normal in all extremities. Finger-to-nose and heel-to-shin performed accurately bilaterally. Gait and Station: Not ambulated in wheelchair   Reflexes: 1+ and symmetric. Toes downgoing.     ASSESSMENT: 67 year old female with cerebellar infarct in December 2013 secondary to cardiogenic embolism from left atrial thrombus and new onset atrial fibrillation. Multiple vascular risk factors of hypertension, diabetes, sleep apnea, obesity, congestive heart failure with low ejection fraction, atrial fibrillation and left atrial thrombus  PLAN: Continue pradaxa for secondary stroke prevention Strict control of hypertension with blood pressure control 130/80 Diabetes with hemoglobin A1c 6.5 or less LDL below 80% Wear CPAP every night for OSA Follow up in 6 months  Nilda Riggs, GNP-BC APRN

## 2012-11-11 NOTE — Patient Instructions (Addendum)
Continue pradaxa for secondary stroke prevention Strict control of hypertension with blood pressure control 130/80 Diabetes with hemoglobin A1c 6.5 or less LDL below 80% Follow up in 6 months

## 2012-12-12 ENCOUNTER — Other Ambulatory Visit: Payer: Self-pay | Admitting: Cardiology

## 2012-12-13 ENCOUNTER — Ambulatory Visit (INDEPENDENT_AMBULATORY_CARE_PROVIDER_SITE_OTHER): Payer: Medicare Other | Admitting: Internal Medicine

## 2012-12-13 ENCOUNTER — Other Ambulatory Visit: Payer: Self-pay | Admitting: *Deleted

## 2012-12-13 ENCOUNTER — Encounter: Payer: Self-pay | Admitting: Internal Medicine

## 2012-12-13 VITALS — BP 150/71 | HR 54 | Ht 66.5 in | Wt 272.8 lb

## 2012-12-13 DIAGNOSIS — I509 Heart failure, unspecified: Secondary | ICD-10-CM

## 2012-12-13 DIAGNOSIS — I5022 Chronic systolic (congestive) heart failure: Secondary | ICD-10-CM

## 2012-12-13 DIAGNOSIS — I255 Ischemic cardiomyopathy: Secondary | ICD-10-CM

## 2012-12-13 DIAGNOSIS — I2510 Atherosclerotic heart disease of native coronary artery without angina pectoris: Secondary | ICD-10-CM

## 2012-12-13 DIAGNOSIS — I2589 Other forms of chronic ischemic heart disease: Secondary | ICD-10-CM

## 2012-12-13 DIAGNOSIS — Z9981 Dependence on supplemental oxygen: Secondary | ICD-10-CM

## 2012-12-13 DIAGNOSIS — I480 Paroxysmal atrial fibrillation: Secondary | ICD-10-CM

## 2012-12-13 DIAGNOSIS — I10 Essential (primary) hypertension: Secondary | ICD-10-CM

## 2012-12-13 DIAGNOSIS — I4891 Unspecified atrial fibrillation: Secondary | ICD-10-CM

## 2012-12-13 DIAGNOSIS — N189 Chronic kidney disease, unspecified: Secondary | ICD-10-CM

## 2012-12-13 LAB — HEPATIC FUNCTION PANEL
ALT: 25 U/L (ref 0–35)
AST: 30 U/L (ref 0–37)
Bilirubin, Direct: 0.1 mg/dL (ref 0.0–0.3)
Total Bilirubin: 0.6 mg/dL (ref 0.3–1.2)

## 2012-12-13 LAB — CBC WITH DIFFERENTIAL/PLATELET
Basophils Relative: 0.4 % (ref 0.0–3.0)
Eosinophils Absolute: 0.2 10*3/uL (ref 0.0–0.7)
HCT: 38 % (ref 36.0–46.0)
Hemoglobin: 12.8 g/dL (ref 12.0–15.0)
Lymphocytes Relative: 26.1 % (ref 12.0–46.0)
Lymphs Abs: 1.8 10*3/uL (ref 0.7–4.0)
MCHC: 33.6 g/dL (ref 30.0–36.0)
Monocytes Relative: 5.5 % (ref 3.0–12.0)
Neutro Abs: 4.6 10*3/uL (ref 1.4–7.7)
RBC: 4.37 Mil/uL (ref 3.87–5.11)

## 2012-12-13 LAB — BASIC METABOLIC PANEL
BUN: 18 mg/dL (ref 6–23)
Chloride: 99 mEq/L (ref 96–112)
GFR: 45.81 mL/min — ABNORMAL LOW (ref >60.00)
Potassium: 3.7 mEq/L (ref 3.5–5.1)
Sodium: 140 mEq/L (ref 135–145)

## 2012-12-13 LAB — TSH: TSH: 4.58 u[IU]/mL (ref 0.35–5.50)

## 2012-12-13 NOTE — Progress Notes (Signed)
PCP:  Leone Payor, MD Primary Cardiologist:  Dr Myrtis Ser  The patient presents today for electrophysiology evaluation.  I met her 12/13 during her medical admission during which she was very very ill.  She has made significant improvement after a prolonged hospital stay.  Her exercise tolerance remains diminished but continues to improve.  She is on home O2 for COPD.  She is unstable with ambulation and requires a walker or scooter for ambulation.   Today, she denies symptoms of palpitations,  orthopnea, PND, dizziness, presyncope, syncope, or neurologic sequela.  The patient feels that she is tolerating medications without difficulties and is otherwise without complaint today.   Past Medical History  Diagnosis Date  . CAD (coronary artery disease)     a. CABG 2001. b. PCI 2011. c. NSTEMI 12/13-03/2012 in prolonged hosp stay, felt demand isch with stable cath.  . Hypertension   . Persistent atrial fibrillation     a. Diagnosed 02/2012 - long hosp stay, difficult to control rates, LAA thrombus noted, needed emergent DCCV for VT/afib/hypotension 04/14/2012.  . Depression   . COPD (chronic obstructive pulmonary disease)   . Sleep apnea     CPAP qhs  . Diabetes mellitus   . Chronic systolic CHF (congestive heart failure)     a. EF 40% 2011, down to 25% 03/2012 felt mixed isch/nonischemic (tachy-mediated).  . Chronic kidney disease   . H/O hiatal hernia   . Headache(784.0)   . Arthritis   . Anemia   . Stroke     a. Stroke 02/2012 with recurrent CVA in-hospital 03/2012 felt embolic.  . Thrombus of left atrial appendage     a. Possible LAA thrombus on TEE 04/01/12.  . Morbid obesity   . On home oxygen therapy   . Thrombocytopenia   . Restless leg   . CVA (cerebral infarction)     Followed by Dr. Pearlean Brownie  . Hx of amiodarone therapy     Atrial fibrillation  . HX: anticoagulation     Atrial fibrillation  . Cardiomyopathy, ischemic   . Ejection fraction     EF 40% in the past   //    EF  25% January, 2014   //   EF 30%, echo, Aug 11, 2012, mid/apical anterior akinesis and anteroseptal akinesis and apical lateral akinesis and akinesis of the true apex.  . Pulmonary hypertension     PA pressure 65 mmHg, echo, Aug 11, 2012  . Sweating     May, 2014   Past Surgical History  Procedure Laterality Date  . Coronary artery bypass graft  2001    x 4 vessels  . Abdominal hysterectomy    . ;    . Rhinosplasty      x 2  . Tonsillectomy    . Knee replacemnt  2000    total right  . Shoulder sx  2000    right bone spurs  . Cardiac catheterization  2013  . Tee without cardioversion  04/01/2012    Procedure: TRANSESOPHAGEAL ECHOCARDIOGRAM (TEE);  Surgeon: Vesta Mixer, MD;  Location: [facility] ENDOSCOPY;  Service: Cardiovascular;  Laterality: N/A;    Current Outpatient Prescriptions  Medication Sig Dispense Refill  . amiodarone (PACERONE) 200 MG tablet Take 1 tablet (200 mg total) by mouth daily.  30 tablet  6  . atorvastatin (LIPITOR) 10 MG tablet Take 1 tablet (10 mg total) by mouth daily.  30 tablet  2  . carvedilol (COREG) 6.25 MG tablet Take 1  tablet (6.25 mg total) by mouth 2 (two) times daily with a meal.      . dabigatran (PRADAXA) 75 MG CAPS Take 75 mg by mouth daily.      . digoxin (LANOXIN) 0.125 MG tablet Take 0.5 tablets (0.0625 mg total) by mouth daily.  15 tablet  6  . Fe Fum-FA-B Cmp-C-Zn-Mg-Mn-Cu (HEMATINIC PLUS COMPLEX) 106-1 MG TABS Take by mouth daily.      . fluticasone (FLONASE) 50 MCG/ACT nasal spray Place 2 sprays into the nose daily.      . Fluticasone-Salmeterol (ADVAIR) 250-50 MCG/DOSE AEPB Inhale 1 puff into the lungs every 12 (twelve) hours.      . hydrALAZINE (APRESOLINE) 10 MG tablet TAKE 2 TABLETS BY MOUTH THREE TIMES DAILY  180 tablet  0  . HYDROcodone-acetaminophen (NORCO) 10-325 MG per tablet Take 1 tablet by mouth every 6 (six) hours as needed for pain.      Marland Kitchen HYDROcodone-acetaminophen (NORCO) 10-325 MG per tablet       . insulin aspart (NOVOLOG) 100  UNIT/ML injection Sliding Scale Insulin 0-15 Units, Subcutaneous, 3 times daily with meals Correction coverage: Moderate CBG < 70: implement hypoglycemia protocol CBG 70 - 120: 0 units CBG 121 - 150: 2 units CBG 151 - 200: 3 units CBG 201 - 250: 5 units CBG 251 - 300: 8 units CBG 301 - 350: 11 units CBG 351 - 400: 15 units CBG > 400: call MD      . Iron Combinations (IRON COMPLEX PO) Take 1 tablet by mouth daily.      . isosorbide dinitrate (ISORDIL) 20 MG tablet Take 20 mg by mouth every 8 (eight) hours. Take 2 tabs daily      . montelukast (SINGULAIR) 10 MG tablet Take 1 tablet (10 mg total) by mouth at bedtime.  30 tablet  0  . NON FORMULARY CPAP nightly      . NON FORMULARY Oxygen at 2L/min while out, prn other times.      Marland Kitchen NOVOFINE 32G X 6 MM MISC       . omeprazole (PRILOSEC) 40 MG capsule Take 40 mg by mouth daily.      . potassium chloride SA (K-DUR,KLOR-CON) 20 MEQ tablet Take 2 tablets (40 mEq total) by mouth 2 (two) times daily.  120 tablet  2  . rOPINIRole (REQUIP) 1 MG tablet Take 2 tablets (2 mg total) by mouth at bedtime.      . sertraline (ZOLOFT) 50 MG tablet Take 50 mg by mouth daily.      Marland Kitchen torsemide (DEMADEX) 20 MG tablet Take 3 tablets (60 mg total) by mouth daily.  90 tablet  2   No current facility-administered medications for this visit.    Allergies  Allergen Reactions  . Penicillins Anaphylaxis  . Azithromycin Hives and Rash  . Bactrim [Sulfamethoxazole W-Trimethoprim] Hives and Rash  . Sulfa Antibiotics Hives and Rash    History   Social History  . Marital Status: Single    Spouse Name: N/A    Number of Children: N/A  . Years of Education: N/A   Occupational History  . Not on file.   Social History Main Topics  . Smoking status: Former Smoker    Quit date: 04/01/1975  . Smokeless tobacco: Never Used  . Alcohol Use: No  . Drug Use: No  . Sexual Activity: Not on file   Other Topics Concern  . Not on file   Social History Narrative  .  No narrative  on file    No family history on file.  ROS-  All systems are reviewed and are negative except as outlined in the HPI above  Physical Exam: Filed Vitals:   12/13/12 1105  BP: 150/71  Pulse: 54  Height: 5' 6.5" (1.689 m)  Weight: 272 lb 12.8 oz (123.741 kg)    GEN- The patient is chronically ill appearing, alert and oriented x 3 today.  obese Head- normocephalic, atraumatic Eyes-  Sclera clear, conjunctiva pink Ears- hearing intact Oropharynx- clear Neck- supple, no JVP Lymph- no cervical lymphadenopathy Lungs- decreased BS, prolonged expiratory phase Heart- Regular rate and rhythm, 2/6 SEM LUSB GI- soft, NT, ND, + BS Extremities- no clubbing, cyanosis, + dependant edema MS- no significant deformity or atrophy, in a scotter today Skin- no rash or lesion Psych- euthymic mood, full affect Neuro- strength and sensation are intact  ekg today reveals sinus bradycardia 50 bpm, PR 182 msec, LAHB, LVH,  Echo reviewed Dr Myrtis Ser notes are reviewed  Assessment and Plan:  1. Ischemic CM/ NYHA Class III CHF She has an ischemic CM with EF 30%.  Though she has been treated with a reasonable medical regimen, her blood pressure remains elevated.  Though she had renal failure in the setting of her acute medical illness, her creatine function appeared to be improved near discharge.  I think that we should reassess her creatinine at this time and then decide about addition of an ace inhibitor.  IF able to add an ace, then this should be uptitrated to maximum dosing at BP allows.  If her BP remains elevated and renal funciton stable, then spironolactone could be added.  Once this medicine regimen is optimized, repeat echo after 3 months of maximum med titration should be performed.  If her EF remains <35% then we could consider ICD at that time.  She would like to avoid ICD if possible and therefore favors the above approach. BMET today, digoxin level today  2, CRI- as above Check bmet  and dig level If CrCl>30 then pradaxa should be increased to 150mg  BID  3. Afib Continue amiodarone Check LFTs and TFTs today Dr Myrtis Ser to follow long term  Will check CBC and BMET on pradaxa and adjust dosing as above if needed  4. HTN Above goal Add lisinopril, particularly as she has diabetes  She will follow-up with Dr Myrtis Ser I will see as needed

## 2012-12-13 NOTE — Patient Instructions (Signed)
Your physician recommends that you schedule a follow-up appointment as needed with Dr Johney Frame and as scheduled for Dr Berton Bon   Your physician has recommended you make the following change in your medication:  1) Start Lisinopril 5mg  daily  Your physician recommends that you return for lab work today: digoxin level/BMP/Liver/TSH/T4/cbc

## 2012-12-14 ENCOUNTER — Other Ambulatory Visit: Payer: Self-pay | Admitting: *Deleted

## 2012-12-14 LAB — DIGOXIN LEVEL: Digoxin Level: 0.6 ng/mL — ABNORMAL LOW (ref 0.8–2.0)

## 2012-12-14 MED ORDER — DABIGATRAN ETEXILATE MESYLATE 150 MG PO CAPS: 150.0000 mg | ORAL_CAPSULE | Freq: Two times a day (BID) | ORAL | Status: DC

## 2012-12-14 MED ORDER — LISINOPRIL 5 MG PO TABS: 5.0000 mg | ORAL_TABLET | Freq: Every day | ORAL | Status: DC

## 2012-12-14 NOTE — Telephone Encounter (Signed)
Spoke with the patient and I have called in both the Lisinopril 5 mg and the increased dose of Pradaxa

## 2013-01-01 ENCOUNTER — Other Ambulatory Visit: Payer: Self-pay | Admitting: Physician Assistant

## 2013-01-07 ENCOUNTER — Other Ambulatory Visit: Payer: Self-pay | Admitting: Cardiology

## 2013-02-03 ENCOUNTER — Other Ambulatory Visit: Payer: Self-pay

## 2013-02-03 ENCOUNTER — Other Ambulatory Visit: Payer: Self-pay | Admitting: Cardiology

## 2013-02-14 ENCOUNTER — Ambulatory Visit (INDEPENDENT_AMBULATORY_CARE_PROVIDER_SITE_OTHER): Payer: Medicare Other | Admitting: Cardiology

## 2013-02-14 ENCOUNTER — Encounter: Payer: Self-pay | Admitting: Cardiology

## 2013-02-14 VITALS — BP 124/63 | HR 54 | Ht 66.0 in | Wt 273.1 lb

## 2013-02-14 DIAGNOSIS — I10 Essential (primary) hypertension: Secondary | ICD-10-CM

## 2013-02-14 DIAGNOSIS — I6350 Cerebral infarction due to unspecified occlusion or stenosis of unspecified cerebral artery: Secondary | ICD-10-CM

## 2013-02-14 DIAGNOSIS — I480 Paroxysmal atrial fibrillation: Secondary | ICD-10-CM

## 2013-02-14 DIAGNOSIS — I4891 Unspecified atrial fibrillation: Secondary | ICD-10-CM

## 2013-02-14 DIAGNOSIS — I255 Ischemic cardiomyopathy: Secondary | ICD-10-CM

## 2013-02-14 DIAGNOSIS — R42 Dizziness and giddiness: Secondary | ICD-10-CM

## 2013-02-14 DIAGNOSIS — I639 Cerebral infarction, unspecified: Secondary | ICD-10-CM

## 2013-02-14 DIAGNOSIS — Z9229 Personal history of other drug therapy: Secondary | ICD-10-CM | POA: Insufficient documentation

## 2013-02-14 DIAGNOSIS — I5022 Chronic systolic (congestive) heart failure: Secondary | ICD-10-CM

## 2013-02-14 DIAGNOSIS — Z9981 Dependence on supplemental oxygen: Secondary | ICD-10-CM

## 2013-02-14 DIAGNOSIS — I2589 Other forms of chronic ischemic heart disease: Secondary | ICD-10-CM

## 2013-02-14 DIAGNOSIS — I2510 Atherosclerotic heart disease of native coronary artery without angina pectoris: Secondary | ICD-10-CM

## 2013-02-14 DIAGNOSIS — N189 Chronic kidney disease, unspecified: Secondary | ICD-10-CM

## 2013-02-14 MED ORDER — LISINOPRIL 10 MG PO TABS: 10.0000 mg | ORAL_TABLET | Freq: Every day | ORAL | Status: DC

## 2013-02-14 NOTE — Assessment & Plan Note (Signed)
Patient had very significant improvement in her renal function. Ultimately he was felt that her meds could be pushed a full dose and that she had good renal function. Also lisinopril could be used.

## 2013-02-14 NOTE — Assessment & Plan Note (Signed)
Blood pressure is controlled. No change in therapy. 

## 2013-02-14 NOTE — Assessment & Plan Note (Signed)
The patient had catheterization in January, 2014,. Her grafts are patent. Medical therapy was recommended at that time. She has had some rare chest pain. I'm not convinced of significant ischemia.

## 2013-02-14 NOTE — Assessment & Plan Note (Signed)
She does have slight edema today. Because she was traveling here she did not take her diuretic at today. No change in therapy.

## 2013-02-14 NOTE — Assessment & Plan Note (Signed)
She is to be continued on amiodarone at this time. She stable. Labs were checked in September and were stable.

## 2013-02-14 NOTE — Assessment & Plan Note (Addendum)
With the improvement in her renal function, lisinopril was started. Today I will titrate the dose of lisinopril up intake her off her hydralazine and nitrate. We'll then see her back for early followup to see if we can titrate meds further. We will try to start spironolactone a later date. After that she will require followup 2-D echo decide if other treatment is appropriate.  As part of today's evaluation I spent greater than 25 minutes with her total care. More than half of this time has been spent with direct contact with the patient discussing all of the issues in making decisions about her care.

## 2013-02-14 NOTE — Assessment & Plan Note (Signed)
The patient remains on 200 mg of amiodarone at this time for her atrial fibrillation. Over time I will try to decide if this can be titrated down.

## 2013-02-14 NOTE — Progress Notes (Signed)
HPI  The patient is seen today to followup her cardiomyopathy and atrial fibrillation and coronary artery disease. I saw her last in May, 2014. She has been seen by others in the office since then and she's also had an EP evaluation by Dr. Johney Frame. Some changes have been made in her medicines. I have reviewed the notes from all of these visits. I have reviewed prior records concerning her prior cerebellar infarct in December, 2013. She has rare chest pain. She's not having any obvious clinical symptomatic congestive heart failure. She has had some dizziness. She has not had true syncope. I believe some of her symptoms may be mild vertigo.  Allergies  Allergen Reactions  . Penicillins Anaphylaxis  . Azithromycin Hives and Rash  . Bactrim [Sulfamethoxazole-Trimethoprim] Hives and Rash  . Sulfa Antibiotics Hives and Rash    Current Outpatient Prescriptions  Medication Sig Dispense Refill  . amiodarone (PACERONE) 200 MG tablet Take 1 tablet (200 mg total) by mouth daily.  30 tablet  6  . atorvastatin (LIPITOR) 10 MG tablet Take 1 tablet (10 mg total) by mouth daily.  30 tablet  2  . carvedilol (COREG) 6.25 MG tablet Take 1 tablet (6.25 mg total) by mouth 2 (two) times daily with a meal.      . dabigatran (PRADAXA) 150 MG CAPS capsule Take 1 capsule (150 mg total) by mouth every 12 (twelve) hours.  60 capsule  11  . DIGOX 125 MCG tablet TAKE 1/2 TABLET BY MOUTH EVERY DAY  15 tablet  6  . Fe Fum-FA-B Cmp-C-Zn-Mg-Mn-Cu (HEMATINIC PLUS COMPLEX) 106-1 MG TABS Take by mouth daily.      . fluticasone (FLONASE) 50 MCG/ACT nasal spray Place 2 sprays into the nose daily.      . Fluticasone-Salmeterol (ADVAIR) 250-50 MCG/DOSE AEPB Inhale 1 puff into the lungs every 12 (twelve) hours.      . hydrALAZINE (APRESOLINE) 10 MG tablet TAKE 2 TABLETS BY MOUTH THREE TIMES DAILY  180 tablet  0  . HYDROcodone-acetaminophen (NORCO) 10-325 MG per tablet Take 1 tablet by mouth every 6 (six) hours as needed for pain.       Marland Kitchen insulin aspart (NOVOLOG) 100 UNIT/ML injection Sliding Scale Insulin 0-15 Units, Subcutaneous, 3 times daily with meals Correction coverage: Moderate CBG < 70: implement hypoglycemia protocol CBG 70 - 120: 0 units CBG 121 - 150: 2 units CBG 151 - 200: 3 units CBG 201 - 250: 5 units CBG 251 - 300: 8 units CBG 301 - 350: 11 units CBG 351 - 400: 15 units CBG > 400: call MD      . Iron Combinations (IRON COMPLEX PO) Take 1 tablet by mouth daily.      . isosorbide dinitrate (ISORDIL) 10 MG tablet Take 20 mg by mouth 3 (three) times daily.      Marland Kitchen lisinopril (PRINIVIL,ZESTRIL) 5 MG tablet Take 1 tablet (5 mg total) by mouth daily.  90 tablet  3  . montelukast (SINGULAIR) 10 MG tablet Take 1 tablet (10 mg total) by mouth at bedtime.  30 tablet  0  . NON FORMULARY CPAP nightly      . NON FORMULARY Oxygen at 2L/min while out, prn other times.      Marland Kitchen omeprazole (PRILOSEC) 40 MG capsule Take 40 mg by mouth daily.      . potassium chloride SA (K-DUR,KLOR-CON) 20 MEQ tablet Take 2 tablets (40 mEq total) by mouth 2 (two) times daily.  120 tablet  2  .  rOPINIRole (REQUIP) 1 MG tablet Take 2 tablets (2 mg total) by mouth at bedtime.      . sertraline (ZOLOFT) 50 MG tablet Take 50 mg by mouth daily.      Marland Kitchen torsemide (DEMADEX) 20 MG tablet Take 3 tablets (60 mg total) by mouth daily.  90 tablet  2   No current facility-administered medications for this visit.    History   Social History  . Marital Status: Single    Spouse Name: N/A    Number of Children: N/A  . Years of Education: N/A   Occupational History  . Not on file.   Social History Main Topics  . Smoking status: Former Smoker    Quit date: 04/01/1975  . Smokeless tobacco: Never Used  . Alcohol Use: No  . Drug Use: No  . Sexual Activity: Not on file   Other Topics Concern  . Not on file   Social History Narrative  . No narrative on file    History reviewed. No pertinent family history.  Past Medical History    Diagnosis Date  . CAD (coronary artery disease)     a. CABG 2001. b. PCI 2011. c. NSTEMI 12/13-03/2012 in prolonged hosp stay, felt demand isch with stable cath.  . Hypertension   . Persistent atrial fibrillation     a. Diagnosed 02/2012 - long hosp stay, difficult to control rates, LAA thrombus noted, needed emergent DCCV for VT/afib/hypotension 04/14/2012.  . Depression   . COPD (chronic obstructive pulmonary disease)   . Sleep apnea     CPAP qhs  . Diabetes mellitus   . Chronic systolic CHF (congestive heart failure)     a. EF 40% 2011, down to 25% 03/2012 felt mixed isch/nonischemic (tachy-mediated).  . Chronic kidney disease   . H/O hiatal hernia   . Headache(784.0)   . Arthritis   . Anemia   . Stroke     a. Stroke 02/2012 with recurrent CVA in-hospital 03/2012 felt embolic.  . Thrombus of left atrial appendage     a. Possible LAA thrombus on TEE 04/01/12.  . Morbid obesity   . On home oxygen therapy   . Thrombocytopenia   . Restless leg   . CVA (cerebral infarction)     Followed by Dr. Pearlean Brownie  . Hx of amiodarone therapy     Atrial fibrillation  . HX: anticoagulation     Atrial fibrillation  . Cardiomyopathy, ischemic   . Ejection fraction     EF 40% in the past   //    EF 25% January, 2014   //   EF 30%, echo, Aug 11, 2012, mid/apical anterior akinesis and anteroseptal akinesis and apical lateral akinesis and akinesis of the true apex.  . Pulmonary hypertension     PA pressure 65 mmHg, echo, Aug 11, 2012  . Sweating     May, 2014    Past Surgical History  Procedure Laterality Date  . Coronary artery bypass graft  2001    x 4 vessels  . Abdominal hysterectomy    . ;    . Rhinosplasty      x 2  . Tonsillectomy    . Knee replacemnt  2000    total right  . Shoulder sx  2000    right bone spurs  . Cardiac catheterization  2013  . Tee without cardioversion  04/01/2012    Procedure: TRANSESOPHAGEAL ECHOCARDIOGRAM (TEE);  Surgeon: Vesta Mixer, MD;  Location: [facility]  ENDOSCOPY;  Service: Cardiovascular;  Laterality: N/A;    Patient Active Problem List   Diagnosis Date Noted  . Sweating   . Arthritis   . Stroke   . Thrombus of left atrial appendage   . Morbid obesity   . On home oxygen therapy   . VT (ventricular tachycardia)   . Thrombocytopenia   . Restless leg   . CAD (coronary artery disease)   . Hypertension   . Paroxysmal atrial fibrillation   . Depression   . COPD (chronic obstructive pulmonary disease)   . Sleep apnea   . Diabetes mellitus   . Chronic systolic CHF (congestive heart failure)   . Chronic kidney disease   . H/O hiatal hernia   . Headache(784.0)   . Ejection fraction   . Pulmonary hypertension   . Cardiomyopathy, ischemic   . CKD (chronic kidney disease)   . Chronic systolic heart failure 06/02/2012  . Anemia 03/26/2012    ROS  Patient denies fever, chills, headache, sweats, rash, change in vision, change in hearing, cough, nausea vomiting, urinary symptoms. All other systems are reviewed and are negative other than the history of present illness.  PHYSICAL EXAM  Patient is overweight. She is in a wheelchair. She's wearing portable oxygen. She's here with a friend. She is oriented to person time and place. Affect is normal. There is no jugulovenous distention. Lungs reveal a few scattered rhonchi. There is no respiratory distress. Cardiac exam reveals S1 and S2. The abdomen is soft. There is trace peripheral edema. There are no musculoskeletal deformities. There are no skin rashes.  Filed Vitals:   02/14/13 1401  BP: 124/63  Pulse: 54  Height: 5\' 6"  (1.676 m)  Weight: 273 lb 1.9 oz (123.886 kg)  SpO2: 96%   6  ASSESSMENT & PLAN

## 2013-02-14 NOTE — Assessment & Plan Note (Signed)
She's having some mild dizziness. There may be a component of mild vertigo. I'm not convinced that there significant orthostasis. This he is to be followed at this time.

## 2013-02-14 NOTE — Assessment & Plan Note (Signed)
She is wearing her portable oxygen today.

## 2013-02-14 NOTE — Patient Instructions (Signed)
Your physician recommends that you schedule a follow-up appointment in: 2-3 weeks. Your physician has recommended you make the following change in your medication:  STOP ISOSORBIDE DINITRATE STOP HYDRALAZINE INCREASE LISINOPRIL TO 10 MG DAILY. You may take (2) of your (5 mg) tablets daily until they are finished. Your new prescription has been sent to your pharmacy. All other medications will remain the same.

## 2013-02-14 NOTE — Assessment & Plan Note (Signed)
The patient had a neurologic event, a cerebellar infarct that was felt to be cardioembolic. She is anticoagulated. No change in therapy.

## 2013-03-21 ENCOUNTER — Ambulatory Visit: Payer: Medicare Other | Admitting: Cardiology

## 2013-03-22 ENCOUNTER — Encounter: Payer: Self-pay | Admitting: Cardiology

## 2013-03-22 ENCOUNTER — Ambulatory Visit (INDEPENDENT_AMBULATORY_CARE_PROVIDER_SITE_OTHER): Payer: Medicare Other | Admitting: Cardiology

## 2013-03-22 VITALS — BP 151/73 | HR 59 | Ht 66.5 in | Wt 271.0 lb

## 2013-03-22 DIAGNOSIS — I2510 Atherosclerotic heart disease of native coronary artery without angina pectoris: Secondary | ICD-10-CM

## 2013-03-22 DIAGNOSIS — I10 Essential (primary) hypertension: Secondary | ICD-10-CM

## 2013-03-22 DIAGNOSIS — I5022 Chronic systolic (congestive) heart failure: Secondary | ICD-10-CM

## 2013-03-22 DIAGNOSIS — I639 Cerebral infarction, unspecified: Secondary | ICD-10-CM

## 2013-03-22 DIAGNOSIS — I255 Ischemic cardiomyopathy: Secondary | ICD-10-CM

## 2013-03-22 DIAGNOSIS — I6350 Cerebral infarction due to unspecified occlusion or stenosis of unspecified cerebral artery: Secondary | ICD-10-CM

## 2013-03-22 DIAGNOSIS — I480 Paroxysmal atrial fibrillation: Secondary | ICD-10-CM

## 2013-03-22 DIAGNOSIS — I4891 Unspecified atrial fibrillation: Secondary | ICD-10-CM

## 2013-03-22 DIAGNOSIS — I2589 Other forms of chronic ischemic heart disease: Secondary | ICD-10-CM

## 2013-03-22 MED ORDER — BD ASSURE BPM/AUTO ARM CUFF MISC: 1.0000 | Status: DC

## 2013-03-22 MED ORDER — LISINOPRIL 20 MG PO TABS: 20.0000 mg | ORAL_TABLET | Freq: Every day | ORAL | Status: DC

## 2013-03-22 NOTE — Assessment & Plan Note (Signed)
Systolic blood pressure is elevated today. Lisinopril dose will be increased to 20 mg daily. She will obtain a home blood pressure cuff. We will then push her medicines to 40 mg daily if her blood pressure is stable.

## 2013-03-22 NOTE — Patient Instructions (Signed)
Your physician recommends that you schedule a follow-up appointment in: February 2015. Your physician has recommended you make the following change in your medication: Increase your lisinopril to 20 mg daily. Please take (2) of your 10 mg tablets daily until they are finished. Your new prescription has been sent to your pharmacy. All other medications will remain the same. You have been given a prescription today to get a blood pressure cuff. Your physician has requested that you regularly monitor and record your blood pressure readings at home. Please use the same machine at the same time of day to check your readings and record them. Please call your results to [facility] [phone].

## 2013-03-22 NOTE — Assessment & Plan Note (Signed)
Her volume status is stable. No change in therapy today. 

## 2013-03-22 NOTE — Progress Notes (Signed)
HPI   The patient is seen today to followup cardiomyopathy and atrial fibrillation and coronary artery disease. She's actually doing well. She saw Dr. Johney Frame in September, 2014. It was felt that we should optimize her medical therapy before considering her ICD. She is in favor. I have been adjusting her medicines and she returns doing well. It is now time to push her lisinopril dose higher. She's not having any significant edema. She does have rare chest discomfort. Nitroglycerin helps. We know from her catheterization in January, 201 for that medical therapy is recommended at this time.  Allergies  Allergen Reactions  . Penicillins Anaphylaxis  . Azithromycin Hives and Rash  . Bactrim [Sulfamethoxazole-Trimethoprim] Hives and Rash  . Catapres [Clonidine Hcl] Itching and Rash  . Sulfa Antibiotics Hives and Rash    Current Outpatient Prescriptions  Medication Sig Dispense Refill  . amiodarone (PACERONE) 200 MG tablet Take 1 tablet (200 mg total) by mouth daily.  30 tablet  6  . atorvastatin (LIPITOR) 10 MG tablet Take 1 tablet (10 mg total) by mouth daily.  30 tablet  2  . carvedilol (COREG) 6.25 MG tablet Take 1 tablet (6.25 mg total) by mouth 2 (two) times daily with a meal.      . dabigatran (PRADAXA) 150 MG CAPS capsule Take 1 capsule (150 mg total) by mouth every 12 (twelve) hours.  60 capsule  11  . DIGOX 125 MCG tablet TAKE 1/2 TABLET BY MOUTH EVERY DAY  15 tablet  6  . Fe Fum-FA-B Cmp-C-Zn-Mg-Mn-Cu (HEMATINIC PLUS COMPLEX) 106-1 MG TABS Take by mouth daily.      . fluticasone (FLONASE) 50 MCG/ACT nasal spray Place 2 sprays into the nose daily.      . Fluticasone-Salmeterol (ADVAIR) 250-50 MCG/DOSE AEPB Inhale 1 puff into the lungs every 12 (twelve) hours.      Marland Kitchen HYDROcodone-acetaminophen (NORCO) 10-325 MG per tablet Take 1 tablet by mouth every 6 (six) hours as needed for pain.      Marland Kitchen insulin aspart (NOVOLOG) 100 UNIT/ML injection Sliding Scale Insulin 0-15 Units, Subcutaneous, 3  times daily with meals Correction coverage: Moderate CBG < 70: implement hypoglycemia protocol CBG 70 - 120: 0 units CBG 121 - 150: 2 units CBG 151 - 200: 3 units CBG 201 - 250: 5 units CBG 251 - 300: 8 units CBG 301 - 350: 11 units CBG 351 - 400: 15 units CBG > 400: call MD      . Iron Combinations (IRON COMPLEX PO) Take 1 tablet by mouth daily.      Marland Kitchen lisinopril (PRINIVIL,ZESTRIL) 10 MG tablet Take 1 tablet (10 mg total) by mouth daily.  90 tablet  3  . montelukast (SINGULAIR) 10 MG tablet Take 1 tablet (10 mg total) by mouth at bedtime.  30 tablet  0  . NITROSTAT 0.4 MG SL tablet Place 0.4 mg under the tongue every 5 (five) minutes as needed.       . NON FORMULARY CPAP nightly      . NON FORMULARY Oxygen at 2L/min while out, prn other times.      Marland Kitchen omeprazole (PRILOSEC) 40 MG capsule Take 40 mg by mouth daily.      . potassium chloride SA (K-DUR,KLOR-CON) 20 MEQ tablet Take 2 tablets (40 mEq total) by mouth 2 (two) times daily.  120 tablet  2  . rOPINIRole (REQUIP) 1 MG tablet Take 2 tablets (2 mg total) by mouth at bedtime.      Marland Kitchen  sertraline (ZOLOFT) 50 MG tablet Take 50 mg by mouth daily.      Marland Kitchen torsemide (DEMADEX) 20 MG tablet Take 3 tablets (60 mg total) by mouth daily.  90 tablet  2   No current facility-administered medications for this visit.    History   Social History  . Marital Status: Single    Spouse Name: N/A    Number of Children: N/A  . Years of Education: N/A   Occupational History  . Not on file.   Social History Main Topics  . Smoking status: Former Smoker    Quit date: 04/01/1975  . Smokeless tobacco: Never Used  . Alcohol Use: No  . Drug Use: No  . Sexual Activity: Not on file   Other Topics Concern  . Not on file   Social History Narrative  . No narrative on file    No family history on file.  Past Medical History  Diagnosis Date  . CAD (coronary artery disease)     a. CABG 2001. b. PCI 2011. c. NSTEMI 12/13-03/2012 in prolonged hosp  stay, felt demand isch with stable cath.  . Hypertension   . Persistent atrial fibrillation     a. Diagnosed 02/2012 - long hosp stay, difficult to control rates, LAA thrombus noted, needed emergent DCCV for VT/afib/hypotension 04/14/2012.  . Depression   . COPD (chronic obstructive pulmonary disease)   . Sleep apnea     CPAP qhs  . Diabetes mellitus   . Chronic systolic CHF (congestive heart failure)     a. EF 40% 2011, down to 25% 03/2012 felt mixed isch/nonischemic (tachy-mediated).  . Chronic kidney disease   . H/O hiatal hernia   . Headache(784.0)   . Arthritis   . Anemia   . Stroke     a. Stroke 02/2012 with recurrent CVA in-hospital 03/2012 felt embolic.  . Thrombus of left atrial appendage     a. Possible LAA thrombus on TEE 04/01/12.  . Morbid obesity   . On home oxygen therapy   . Thrombocytopenia   . Restless leg   . CVA (cerebral infarction)     Followed by Dr. Pearlean Brownie  . Hx of amiodarone therapy     Atrial fibrillation  . HX: anticoagulation     Atrial fibrillation  . Cardiomyopathy, ischemic   . Ejection fraction     EF 40% in the past   //    EF 25% January, 2014   //   EF 30%, echo, Aug 11, 2012, mid/apical anterior akinesis and anteroseptal akinesis and apical lateral akinesis and akinesis of the true apex.  . Pulmonary hypertension     PA pressure 65 mmHg, echo, Aug 11, 2012  . Sweating     May, 2014  . Dizziness     Past Surgical History  Procedure Laterality Date  . Coronary artery bypass graft  2001    x 4 vessels  . Abdominal hysterectomy    . ;    . Rhinosplasty      x 2  . Tonsillectomy    . Knee replacemnt  2000    total right  . Shoulder sx  2000    right bone spurs  . Cardiac catheterization  2013  . Tee without cardioversion  04/01/2012    Procedure: TRANSESOPHAGEAL ECHOCARDIOGRAM (TEE);  Surgeon: Vesta Mixer, MD;  Location: [facility];  Service: Cardiovascular;  Laterality: N/A;    Patient Active Problem List   Diagnosis Date Noted    .  H/O amiodarone therapy 02/14/2013  . Dizziness   . Sweating   . Arthritis   . Stroke   . Thrombus of left atrial appendage   . Morbid obesity   . On home oxygen therapy   . VT (ventricular tachycardia)   . Thrombocytopenia   . Restless leg   . CAD (coronary artery disease)   . Hypertension   . Paroxysmal atrial fibrillation   . Depression   . COPD (chronic obstructive pulmonary disease)   . Sleep apnea   . Diabetes mellitus   . Chronic kidney disease   . H/O hiatal hernia   . Headache(784.0)   . Ejection fraction   . Pulmonary hypertension   . Cardiomyopathy, ischemic   . CKD (chronic kidney disease)   . Chronic systolic heart failure 06/02/2012  . Anemia 03/26/2012    ROS   Patient is here with a friend. She denies fever, chills, headache, sweats, rash, change in vision, change in hearing, cough, nausea vomiting, urinary symptoms. All other systems are reviewed and are negative.   PHYSICAL EXAM  She is overweight. She is here in a wheelchair. She is oriented to person time and place. Affect is normal. She is here with a friend. There is no jugulovenous distention. Lungs are clear. Respiratory effort is nonlabored. Cardiac exam reveals S1 and S2. There no clicks or significant murmurs. Abdomen is soft. There is no peripheral edema.  Filed Vitals:   03/22/13 0913  BP: 151/73  Pulse: 59  Height: 5' 6.5" (1.689 m)  Weight: 271 lb (122.925 kg)    EKG  ASSESSMENT & PLAN

## 2013-03-22 NOTE — Assessment & Plan Note (Signed)
The patient had a cerebellar infarct it was felt to be cardioembolic. She is anticoagulated.

## 2013-03-22 NOTE — Assessment & Plan Note (Signed)
We are titrating her medications. When I am done with this we will do a followup echo and then final plan will be made as to whether or not she should have an ICD.

## 2013-03-22 NOTE — Assessment & Plan Note (Signed)
Coronary disease is stable. She has rare chest discomfort. Grafts were patent January, 2014. No change in therapy.

## 2013-03-22 NOTE — Assessment & Plan Note (Signed)
Her rhythm is regular at this time. Over time I made reduce her amiodarone dose. I'm not prepared to do this yet. Her amiodarone labs were stable in September.

## 2013-05-05 ENCOUNTER — Telehealth: Payer: Self-pay | Admitting: *Deleted

## 2013-05-05 NOTE — Telephone Encounter (Signed)
04/22/13 @11 :00 am 170/82 HR 47 @ 5:00 pm 157/97 HR 47  04/23/13 @11 :00 am 174/80 HR 49  @ 5:00 pm 132/70 HR 48  04/24/13 @11 :00 am 148/86 HR 50  @5 :00 pm 156/82 HR 46  04/25/13 11:00 am 148/86 HR 49  @5 :00 pm 153/92 HR 43  04/26/13 11:00 am 178/86 HR 47  @5 :00 pm 179/85 HR 52  04/27/13 11:00 am 176/92 HR 53  @5 :00 pm 176/78 HR 46  04/28/13 @11 :00 am 181/97 HR 52 @5 :00 pm 157/88 HR 45  04/29/13 @11 :00 am 183/74 HR 44 @5 :00 pm 138/76 HR 52  04/30/13 @11 :00 am 180/92 HR 54  @5 :00 pm 184/60 HR 52  05/01/13 @11 :00 am 170/78 HR 50  @5 :00 pm 138/75 HR 52  05/02/13 @11 :00 am 137/53 HR 53  @5 :00 pm 180/82 HR 51  05/03/13 @11 :00 am 165/74 HR 54  @5 :00 pm 146/82 HR 48  05/04/13 @11 :00 am 146/68 HR 50  @5 :00 pm 130/80 HR 52  05/05/13 @11 :00 am 129/57 HR 48  @3 :00 pm 129/57 HR 48

## 2013-05-06 NOTE — Telephone Encounter (Signed)
Increase lisinopril to 20 mg twice a day. Then checked pressure once daily and send [place] more home blood pressure checks over 10 day period

## 2013-05-09 MED ORDER — LISINOPRIL 20 MG PO TABS: 20.0000 mg | ORAL_TABLET | Freq: Two times a day (BID) | ORAL | Status: DC

## 2013-05-09 NOTE — Telephone Encounter (Signed)
Patient notified.  She will bring bp readings to her next appointment with Dr. Ron Parker scheduled for Monday, 05/16/2013.

## 2013-05-16 ENCOUNTER — Ambulatory Visit: Payer: Medicare Other | Admitting: Nurse Practitioner

## 2013-05-16 ENCOUNTER — Ambulatory Visit: Payer: Medicare Other | Admitting: Cardiology

## 2013-05-30 ENCOUNTER — Ambulatory Visit: Payer: Medicare Other | Admitting: Nurse Practitioner

## 2013-05-31 ENCOUNTER — Encounter: Payer: Self-pay | Admitting: Cardiology

## 2013-05-31 ENCOUNTER — Ambulatory Visit (INDEPENDENT_AMBULATORY_CARE_PROVIDER_SITE_OTHER): Payer: Medicare Other | Admitting: Cardiology

## 2013-05-31 VITALS — BP 152/73 | HR 48 | Ht 66.0 in | Wt 272.8 lb

## 2013-05-31 DIAGNOSIS — I255 Ischemic cardiomyopathy: Secondary | ICD-10-CM

## 2013-05-31 DIAGNOSIS — Z9981 Dependence on supplemental oxygen: Secondary | ICD-10-CM

## 2013-05-31 DIAGNOSIS — I639 Cerebral infarction, unspecified: Secondary | ICD-10-CM

## 2013-05-31 DIAGNOSIS — I2589 Other forms of chronic ischemic heart disease: Secondary | ICD-10-CM

## 2013-05-31 DIAGNOSIS — I6350 Cerebral infarction due to unspecified occlusion or stenosis of unspecified cerebral artery: Secondary | ICD-10-CM

## 2013-05-31 DIAGNOSIS — I480 Paroxysmal atrial fibrillation: Secondary | ICD-10-CM

## 2013-05-31 DIAGNOSIS — Z9229 Personal history of other drug therapy: Secondary | ICD-10-CM

## 2013-05-31 DIAGNOSIS — I10 Essential (primary) hypertension: Secondary | ICD-10-CM

## 2013-05-31 DIAGNOSIS — R001 Bradycardia, unspecified: Secondary | ICD-10-CM

## 2013-05-31 DIAGNOSIS — I5022 Chronic systolic (congestive) heart failure: Secondary | ICD-10-CM

## 2013-05-31 DIAGNOSIS — I2510 Atherosclerotic heart disease of native coronary artery without angina pectoris: Secondary | ICD-10-CM

## 2013-05-31 DIAGNOSIS — I4891 Unspecified atrial fibrillation: Secondary | ICD-10-CM

## 2013-05-31 DIAGNOSIS — I498 Other specified cardiac arrhythmias: Secondary | ICD-10-CM

## 2013-05-31 MED ORDER — AMLODIPINE BESYLATE 2.5 MG PO TABS: 2.5000 mg | ORAL_TABLET | Freq: Every day | ORAL | Status: DC

## 2013-05-31 MED ORDER — AMIODARONE HCL 200 MG PO TABS: 100.0000 mg | ORAL_TABLET | Freq: Every day | ORAL | Status: DC

## 2013-05-31 NOTE — Patient Instructions (Signed)
Your physician recommends that you schedule a follow-up appointment in: 4 weeks. Your physician has recommended you make the following change in your medication: Decrease your amiodarone to 100 mg daily. Please break your 200 mg tablet in half. Your new prescription has been sent to your pharmacy. Start amlodipine 2.5 mg daily. Your new prescription has been sent to your pharmacy. All other medications will remain the same.

## 2013-05-31 NOTE — Assessment & Plan Note (Signed)
The patient has relative bradycardia. She is fatigued. Am hopeful that her heart rate will come up with the decrease in her amiodarone. I'm hesitant to lower her carvedilol dose at this point.  As part of today's evaluation I spent greater than 25 minutes with her total care. More than half of this time is been with direct contact with the patient discussing her multitude of problems. I also try to encourage her as she appears to be depressed today.

## 2013-05-31 NOTE — Assessment & Plan Note (Signed)
Blood pressures at home are elevated. With LV dysfunction I would like to try nitrate but she has significant headaches. I will try a small dose of amlodipine hoping that she does not have headaches from this.

## 2013-05-31 NOTE — Assessment & Plan Note (Signed)
Her volume status is stable at this time. No change in therapy.

## 2013-05-31 NOTE — Assessment & Plan Note (Signed)
She had a neurologic event that was felt to be embolic in December, 7903. Her description to me of her recent emergency room visit in [place] suggested she had a CT scan followed by an MRI. Clinically I get the impression that her MRI was consistent with her prior CVA. She is anticoagulated.

## 2013-05-31 NOTE — Assessment & Plan Note (Signed)
The plan is to continue to adjust her medications and later proceed with a followup 2-D echo. I cannot push her beta blocker any higher. She is on an ACE inhibitor. She has renal dysfunction I have not chosen to use panel lactone yet.

## 2013-05-31 NOTE — Assessment & Plan Note (Signed)
Coronary disease is stable. Catheterization done January, 2014 revealed stable coronaries. Her ejection fraction was reduced at that time.

## 2013-05-31 NOTE — Assessment & Plan Note (Signed)
Amiodarone dose will be decreased. However check her labs after I see her back next time.

## 2013-05-31 NOTE — Progress Notes (Signed)
HPI  Patient is seen back today to followup coronary disease and hypertension and atrial fibrillation and cardiomyopathy. Today she is somewhat depressed over her inability to be more active. She and I discussed this. She does feel fatigued. There is bradycardia. It is possible that adjusting her meds may help with some of her fatigue. She is kept careful attention to her blood pressure at home. She does remain hypertensive. Her meds will be adjusted further. Her plan has been to continue adjusting her meds before followup echo and consideration of an ICD.  She tells me that she was seen in the emergency room and kept overnight in [place]. She had a headache along with sinus symptoms. Her CT scan was abnormal and she had a followup MRI to be sure that this was related to her old neurologic event. This was felt to be the case and she was allowed to go home.  I have reviewed a note from May 23, 2013. This appears to be a partial emergency room note stating that her overall problem with sinusitis from that visit.  Allergies  Allergen Reactions  . Penicillins Anaphylaxis  . Azithromycin Hives and Rash  . Bactrim [Sulfamethoxazole-Trimethoprim] Hives and Rash  . Catapres [Clonidine Hcl] Itching and Rash  . Sulfa Antibiotics Hives and Rash    Current Outpatient Prescriptions  Medication Sig Dispense Refill  . albuterol (PROVENTIL HFA;VENTOLIN HFA) 108 (90 BASE) MCG/ACT inhaler Inhale 1-2 puffs into the lungs every 6 (six) hours as needed for wheezing or shortness of breath.      Marland Kitchen amiodarone (PACERONE) 200 MG tablet Take 1 tablet (200 mg total) by mouth daily.  30 tablet  6  . atorvastatin (LIPITOR) 10 MG tablet Take 1 tablet (10 mg total) by mouth daily.  30 tablet  2  . carvedilol (COREG) 6.25 MG tablet Take 1 tablet (6.25 mg total) by mouth 2 (two) times daily with a meal.      . dabigatran (PRADAXA) 150 MG CAPS capsule Take 1 capsule (150 mg total) by mouth every 12 (twelve)  hours.  60 capsule  11  . DIGOX 125 MCG tablet TAKE 1/2 TABLET BY MOUTH EVERY DAY  15 tablet  6  . doxycycline (VIBRAMYCIN) 100 MG capsule Take 100 mg by mouth 2 (two) times daily.      . Fe Fum-FA-B Cmp-C-Zn-Mg-Mn-Cu (HEMATINIC PLUS COMPLEX) 106-1 MG TABS Take by mouth daily.      . fluticasone (FLONASE) 50 MCG/ACT nasal spray Place 2 sprays into the nose daily.      . Fluticasone-Salmeterol (ADVAIR) 250-50 MCG/DOSE AEPB Inhale 1 puff into the lungs every 12 (twelve) hours.      Marland Kitchen HYDROcodone-acetaminophen (NORCO) 10-325 MG per tablet Take 1 tablet by mouth every 6 (six) hours as needed for pain.      Marland Kitchen insulin aspart (NOVOLOG) 100 UNIT/ML injection Sliding Scale Insulin 0-15 Units, Subcutaneous, 3 times daily with meals Correction coverage: Moderate CBG < 70: implement hypoglycemia protocol CBG 70 - 120: 0 units CBG 121 - 150: 2 units CBG 151 - 200: 3 units CBG 201 - 250: 5 units CBG 251 - 300: 8 units CBG 301 - 350: 11 units CBG 351 - 400: 15 units CBG > 400: call MD      . ipratropium-albuterol (DUONEB) 0.5-2.5 (3) MG/3ML SOLN Take 3 mLs by nebulization every 6 (six) hours as needed.      Marland Kitchen lisinopril (PRINIVIL,ZESTRIL) 20 MG tablet Take 1 tablet (20 mg total) by  mouth 2 (two) times daily.      . montelukast (SINGULAIR) 10 MG tablet Take 1 tablet (10 mg total) by mouth at bedtime.  30 tablet  0  . NITROSTAT 0.4 MG SL tablet Place 0.4 mg under the tongue every 5 (five) minutes as needed.       . NON FORMULARY CPAP nightly      . NON FORMULARY Oxygen at 2L/min while out, prn other times.      Marland Kitchen omeprazole (PRILOSEC) 40 MG capsule Take 40 mg by mouth daily.      . potassium chloride SA (K-DUR,KLOR-CON) 20 MEQ tablet Take 2 tablets (40 mEq total) by mouth 2 (two) times daily.  120 tablet  2  . rOPINIRole (REQUIP) 2 MG tablet Take 2 mg by mouth at bedtime.      . sertraline (ZOLOFT) 50 MG tablet Take 50 mg by mouth daily.      . temazepam (RESTORIL) 30 MG capsule Take 30 mg by mouth at  bedtime as needed for sleep.      Marland Kitchen tiotropium (SPIRIVA) 18 MCG inhalation capsule Place 18 mcg into inhaler and inhale daily.      Marland Kitchen torsemide (DEMADEX) 20 MG tablet Take 3 tablets (60 mg total) by mouth daily.  90 tablet  2   No current facility-administered medications for this visit.    History   Social History  . Marital Status: Single    Spouse Name: N/A    Number of Children: N/A  . Years of Education: N/A   Occupational History  . Not on file.   Social History Main Topics  . Smoking status: Former Smoker    Quit date: 04/01/1975  . Smokeless tobacco: Never Used  . Alcohol Use: No  . Drug Use: No  . Sexual Activity: Not on file   Other Topics Concern  . Not on file   Social History Narrative  . No narrative on file    History reviewed. No pertinent family history.  Past Medical History  Diagnosis Date  . CAD (coronary artery disease)     a. CABG 2001. b. PCI 2011. c. NSTEMI 12/13-03/2012 in prolonged hosp stay, felt demand isch with stable cath.  . Hypertension   . Persistent atrial fibrillation     a. Diagnosed 02/2012 - long hosp stay, difficult to control rates, LAA thrombus noted, needed emergent DCCV for VT/afib/hypotension 04/14/2012.  . Depression   . COPD (chronic obstructive pulmonary disease)   . Sleep apnea     CPAP qhs  . Diabetes mellitus   . Chronic systolic CHF (congestive heart failure)     a. EF 40% 2011, down to 25% 03/2012 felt mixed isch/nonischemic (tachy-mediated).  . Chronic kidney disease   . H/O hiatal hernia   . Headache(784.0)   . Arthritis   . Anemia   . Stroke     a. Stroke 02/2012 with recurrent CVA in-hospital 123456 felt embolic.  . Thrombus of left atrial appendage     a. Possible LAA thrombus on TEE 04/01/12.  . Morbid obesity   . On home oxygen therapy   . Thrombocytopenia   . Restless leg   . CVA (cerebral infarction)     Followed by Dr. Leonie Man  . Hx of amiodarone therapy     Atrial fibrillation  . HX:  anticoagulation     Atrial fibrillation  . Cardiomyopathy, ischemic   . Ejection fraction     EF 40% in the past   //  EF 25% January, 2014   //   EF 30%, echo, Aug 11, 2012, mid/apical anterior akinesis and anteroseptal akinesis and apical lateral akinesis and akinesis of the true apex.  . Pulmonary hypertension     PA pressure 65 mmHg, echo, Aug 11, 2012  . Sweating     May, 2014  . Dizziness     Past Surgical History  Procedure Laterality Date  . Coronary artery bypass graft  2001    x 4 vessels  . Abdominal hysterectomy    . ;    . Rhinosplasty      x 2  . Tonsillectomy    . Knee replacemnt  2000    total right  . Shoulder sx  2000    right bone spurs  . Cardiac catheterization  2013  . Tee without cardioversion  04/01/2012    Procedure: TRANSESOPHAGEAL ECHOCARDIOGRAM (TEE);  Surgeon: Thayer Headings, MD;  Location: [facility];  Service: Cardiovascular;  Laterality: N/A;    Patient Active Problem List   Diagnosis Date Noted  . H/O amiodarone therapy 02/14/2013  . Dizziness   . Sweating   . Arthritis   . Stroke   . Thrombus of left atrial appendage   . Morbid obesity   . On home oxygen therapy   . VT (ventricular tachycardia)   . Thrombocytopenia   . Restless leg   . CAD (coronary artery disease)   . Hypertension   . Paroxysmal atrial fibrillation   . Depression   . COPD (chronic obstructive pulmonary disease)   . Sleep apnea   . Diabetes mellitus   . Chronic kidney disease   . H/O hiatal hernia   . Headache(784.0)   . Ejection fraction   . Pulmonary hypertension   . Cardiomyopathy, ischemic   . CKD (chronic kidney disease)   . Chronic systolic heart failure 45/40/9811  . Anemia 03/26/2012    ROS   Patient denies fever, chills, headache, sweats, rash, change in vision, change in hearing, chest pain, cough, nausea vomiting, urinary symptoms. All other systems are reviewed and are negative.  PHYSICAL EXAM  Patient is overweight. She is in a  wheelchair. She's wearing her portable pulsed O2. There is no jugulovenous distention. Lungs are clear. Respiratory effort is nonlabored. Cardiac exam reveals S1 and S2. There no clicks or significant murmurs. Abdomen is soft. There is no peripheral edema.  Filed Vitals:   05/31/13 1052  BP: 152/73  Pulse: 48  Height: 5\' 6"  (1.676 m)  Weight: 272 lb 12.8 oz (123.741 kg)  SpO2: 95%     ASSESSMENT & PLAN

## 2013-05-31 NOTE — Assessment & Plan Note (Signed)
She continues to wear home oxygen. No change in therapy.

## 2013-05-31 NOTE — Assessment & Plan Note (Signed)
The patient has significant atrial fibrillation requiring cardioversion when hospitalized in December, 2013. It is time now to try to lower her dose. Amiodarone be decreased to 100 mg daily.

## 2013-06-17 ENCOUNTER — Ambulatory Visit (INDEPENDENT_AMBULATORY_CARE_PROVIDER_SITE_OTHER): Payer: Medicare Other | Admitting: Cardiology

## 2013-06-17 ENCOUNTER — Encounter: Payer: Self-pay | Admitting: Cardiology

## 2013-06-17 VITALS — BP 145/74 | HR 47 | Ht 66.5 in | Wt 272.0 lb

## 2013-06-17 DIAGNOSIS — R001 Bradycardia, unspecified: Secondary | ICD-10-CM

## 2013-06-17 DIAGNOSIS — R5381 Other malaise: Secondary | ICD-10-CM

## 2013-06-17 DIAGNOSIS — R9430 Abnormal result of cardiovascular function study, unspecified: Secondary | ICD-10-CM

## 2013-06-17 DIAGNOSIS — R0602 Shortness of breath: Secondary | ICD-10-CM

## 2013-06-17 DIAGNOSIS — K3189 Other diseases of stomach and duodenum: Secondary | ICD-10-CM

## 2013-06-17 DIAGNOSIS — I5022 Chronic systolic (congestive) heart failure: Secondary | ICD-10-CM

## 2013-06-17 DIAGNOSIS — F3289 Other specified depressive episodes: Secondary | ICD-10-CM

## 2013-06-17 DIAGNOSIS — D649 Anemia, unspecified: Secondary | ICD-10-CM

## 2013-06-17 DIAGNOSIS — I2589 Other forms of chronic ischemic heart disease: Secondary | ICD-10-CM

## 2013-06-17 DIAGNOSIS — R61 Generalized hyperhidrosis: Secondary | ICD-10-CM

## 2013-06-17 DIAGNOSIS — I10 Essential (primary) hypertension: Secondary | ICD-10-CM

## 2013-06-17 DIAGNOSIS — I6350 Cerebral infarction due to unspecified occlusion or stenosis of unspecified cerebral artery: Secondary | ICD-10-CM

## 2013-06-17 DIAGNOSIS — K30 Functional dyspepsia: Secondary | ICD-10-CM

## 2013-06-17 DIAGNOSIS — Z9229 Personal history of other drug therapy: Secondary | ICD-10-CM

## 2013-06-17 DIAGNOSIS — I2510 Atherosclerotic heart disease of native coronary artery without angina pectoris: Secondary | ICD-10-CM

## 2013-06-17 DIAGNOSIS — Z9981 Dependence on supplemental oxygen: Secondary | ICD-10-CM

## 2013-06-17 DIAGNOSIS — F329 Major depressive disorder, single episode, unspecified: Secondary | ICD-10-CM

## 2013-06-17 DIAGNOSIS — I255 Ischemic cardiomyopathy: Secondary | ICD-10-CM

## 2013-06-17 DIAGNOSIS — R0989 Other specified symptoms and signs involving the circulatory and respiratory systems: Secondary | ICD-10-CM

## 2013-06-17 DIAGNOSIS — F32A Depression, unspecified: Secondary | ICD-10-CM

## 2013-06-17 DIAGNOSIS — IMO0001 Reserved for inherently not codable concepts without codable children: Secondary | ICD-10-CM

## 2013-06-17 DIAGNOSIS — R1013 Epigastric pain: Secondary | ICD-10-CM

## 2013-06-17 DIAGNOSIS — R5383 Other fatigue: Secondary | ICD-10-CM

## 2013-06-17 DIAGNOSIS — I639 Cerebral infarction, unspecified: Secondary | ICD-10-CM

## 2013-06-17 DIAGNOSIS — I498 Other specified cardiac arrhythmias: Secondary | ICD-10-CM

## 2013-06-17 DIAGNOSIS — IMO0002 Reserved for concepts with insufficient information to code with codable children: Secondary | ICD-10-CM

## 2013-06-17 MED ORDER — OMEPRAZOLE 40 MG PO CPDR: 40.0000 mg | DELAYED_RELEASE_CAPSULE | Freq: Two times a day (BID) | ORAL | Status: DC

## 2013-06-17 MED ORDER — OMEPRAZOLE 40 MG PO CPDR: 40.0000 mg | DELAYED_RELEASE_CAPSULE | Freq: Every day | ORAL | Status: DC

## 2013-06-17 MED ORDER — SPIRONOLACTONE 25 MG PO TABS
12.5000 mg | ORAL_TABLET | Freq: Every day | ORAL | Status: DC
Start: 2013-06-17 — End: 2013-07-20

## 2013-06-17 NOTE — Assessment & Plan Note (Signed)
Her amiodarone dose is down to 100 mg daily. Her rhythm is regular. We need amiodarone labs including TSH.

## 2013-06-17 NOTE — Assessment & Plan Note (Signed)
Her heart rate remains in the range of 55. She is on carvedilol 6.25 twice a day. I'm hesitant to change this because of the help that he get his blood pressure and her LV function.

## 2013-06-17 NOTE — Assessment & Plan Note (Signed)
The patient has known severe three-vessel disease. She has patent grafts. Medical therapy was recommended January, 2014.

## 2013-06-17 NOTE — Assessment & Plan Note (Signed)
Her volume status is stable. No change in therapy for this.

## 2013-06-17 NOTE — Patient Instructions (Addendum)
Your physician recommends that you schedule a follow-up appointment in: April 29,2015 with Dr. Ron Parker. Your physician has recommended you make the following change in your medication:  Stop amlodipine 2.5 mg. Increase your omeprazole 40 mg to twice daily. Please take your second dose 30 minutes before supper. Start spironolactone 12.5 mg daily. All other medications will remain the same. Your physician has requested that you have an echocardiogram. Echocardiography is a painless test that uses sound waves to create images of your heart. It provides your doctor with information about the size and shape of your heart and how well your heart's chambers and valves are working. This procedure takes approximately one hour. There are no restrictions for this procedure. Your physician recommends that you lab work today to check your CMET,CBC,TSH. Your physician recommends that you have lab work in 1 week around 06/24/13 to recheck your BMET. Your physician recommends that you have lab work in 4 weeks around 07/15/13 to recheck your BMET.

## 2013-06-17 NOTE — Assessment & Plan Note (Signed)
There is a history of anemia. She's feeling poorly. CBC will be checked.

## 2013-06-17 NOTE — Assessment & Plan Note (Signed)
The patient has a multitude of medical problems and certainly has a basis for depression. I'm not sure how to approach this any further from my viewpoint.

## 2013-06-17 NOTE — Assessment & Plan Note (Signed)
I'm adding spironolactone. She'll be having a followup echo and I'm hoping to see improvement in LV function.

## 2013-06-17 NOTE — Assessment & Plan Note (Signed)
The patient did have a neurologic event in December, 2013 and then again in January, 2014. This was felt to be embolic. She is anticoagulated for her atrial fib.

## 2013-06-17 NOTE — Assessment & Plan Note (Signed)
Blood pressure continues to be elevated. She is on an ACE inhibitor and a beta blocker. She is on a diuretic. She is having GI symptoms. This is possibly related to her amlodipine. She thinks that this medication makes her feel worse. She's also had some headaches since we started. Amlodipine will be stopped. For now I am going to add spironolactone for her left ventricular dysfunction and hope that it helps with her blood pressure also.

## 2013-06-17 NOTE — Progress Notes (Signed)
Patient ID: Caitlyn Hardy, female   DOB: [birth year], 68 y.o.   MRN: [id number]    HPI  Patient is seen today to followup coronary disease, hypertension, paroxysmal atrial fibrillation, cardiomyopathy. She continues to feel fatigued. There is a component of depression. There continues to be bradycardia despite lowering her dose of amiodarone. Her blood pressure is running in the 626-948 range systolic on a regular basis despite her medications. She is having indigestion that might be related to her laying amlodipine. She's also had some headaches with this. She's not having any significant chest pain.  Allergies  Allergen Reactions  . Penicillins Anaphylaxis  . Azithromycin Hives and Rash  . Bactrim [Sulfamethoxazole-Trimethoprim] Hives and Rash  . Catapres [Clonidine Hcl] Itching and Rash  . Sulfa Antibiotics Hives and Rash    Current Outpatient Prescriptions  Medication Sig Dispense Refill  . albuterol (PROVENTIL HFA;VENTOLIN HFA) 108 (90 BASE) MCG/ACT inhaler Inhale 1-2 puffs into the lungs every 6 (six) hours as needed for wheezing or shortness of breath.      Marland Kitchen amiodarone (PACERONE) 200 MG tablet Take 0.5 tablets (100 mg total) by mouth daily.  45 tablet  3  . amLODipine (NORVASC) 2.5 MG tablet Take 1 tablet (2.5 mg total) by mouth daily.  180 tablet  3  . atorvastatin (LIPITOR) 10 MG tablet Take 1 tablet (10 mg total) by mouth daily.  30 tablet  2  . carvedilol (COREG) 6.25 MG tablet Take 1 tablet (6.25 mg total) by mouth 2 (two) times daily with a meal.      . dabigatran (PRADAXA) 150 MG CAPS capsule Take 1 capsule (150 mg total) by mouth every 12 (twelve) hours.  60 capsule  11  . DIGOX 125 MCG tablet TAKE 1/2 TABLET BY MOUTH EVERY DAY  15 tablet  6  . Fe Fum-FA-B Cmp-C-Zn-Mg-Mn-Cu (HEMATINIC PLUS COMPLEX) 106-1 MG TABS Take by mouth daily.      . fluticasone (FLONASE) 50 MCG/ACT nasal spray Place 2 sprays into the nose daily.      . Fluticasone-Salmeterol (ADVAIR) 250-50 MCG/DOSE  AEPB Inhale 1 puff into the lungs every 12 (twelve) hours.      Marland Kitchen HYDROcodone-acetaminophen (NORCO) 10-325 MG per tablet Take 1 tablet by mouth every 6 (six) hours as needed for pain.      Marland Kitchen insulin aspart (NOVOLOG) 100 UNIT/ML injection Sliding Scale Insulin 0-15 Units, Subcutaneous, 3 times daily with meals Correction coverage: Moderate CBG < 70: implement hypoglycemia protocol CBG 70 - 120: 0 units CBG 121 - 150: 2 units CBG 151 - 200: 3 units CBG 201 - 250: 5 units CBG 251 - 300: 8 units CBG 301 - 350: 11 units CBG 351 - 400: 15 units CBG > 400: call MD      . ipratropium-albuterol (DUONEB) 0.5-2.5 (3) MG/3ML SOLN Take 3 mLs by nebulization every 6 (six) hours as needed.      Marland Kitchen lisinopril (PRINIVIL,ZESTRIL) 20 MG tablet Take 1 tablet (20 mg total) by mouth 2 (two) times daily.      . montelukast (SINGULAIR) 10 MG tablet Take 1 tablet (10 mg total) by mouth at bedtime.  30 tablet  0  . NITROSTAT 0.4 MG SL tablet Place 0.4 mg under the tongue every 5 (five) minutes as needed.       . NON FORMULARY CPAP nightly      . NON FORMULARY Oxygen at 2L/min while out, prn other times.      Marland Kitchen omeprazole (PRILOSEC) 40 MG capsule  Take 40 mg by mouth daily.      . potassium chloride SA (K-DUR,KLOR-CON) 20 MEQ tablet Take 2 tablets (40 mEq total) by mouth 2 (two) times daily.  120 tablet  2  . rOPINIRole (REQUIP) 2 MG tablet Take 2 mg by mouth at bedtime.      . sertraline (ZOLOFT) 50 MG tablet Take 50 mg by mouth daily.      . temazepam (RESTORIL) 30 MG capsule Take 30 mg by mouth at bedtime as needed for sleep.      Marland Kitchen tiotropium (SPIRIVA) 18 MCG inhalation capsule Place 18 mcg into inhaler and inhale daily.      Marland Kitchen torsemide (DEMADEX) 20 MG tablet Take 3 tablets (60 mg total) by mouth daily.  90 tablet  2   No current facility-administered medications for this visit.    History   Social History  . Marital Status: Single    Spouse Name: N/A    Number of Children: N/A  . Years of Education:  N/A   Occupational History  . Not on file.   Social History Main Topics  . Smoking status: Former Smoker    Quit date: 04/01/1975  . Smokeless tobacco: Never Used  . Alcohol Use: No  . Drug Use: No  . Sexual Activity: Not on file   Other Topics Concern  . Not on file   Social History Narrative  . No narrative on file    History reviewed. No pertinent family history.  Past Medical History  Diagnosis Date  . CAD (coronary artery disease)     a. CABG 2001. b. PCI 2011. c. NSTEMI 12/13-03/2012 in prolonged hosp stay, felt demand isch with stable cath.  . Hypertension   . Persistent atrial fibrillation     a. Diagnosed 02/2012 - long hosp stay, difficult to control rates, LAA thrombus noted, needed emergent DCCV for VT/afib/hypotension 04/14/2012.  . Depression   . COPD (chronic obstructive pulmonary disease)   . Sleep apnea     CPAP qhs  . Diabetes mellitus   . Chronic systolic CHF (congestive heart failure)     a. EF 40% 2011, down to 25% 03/2012 felt mixed isch/nonischemic (tachy-mediated).  . Chronic kidney disease   . H/O hiatal hernia   . Headache(784.0)   . Arthritis   . Anemia   . Stroke     a. Stroke 02/2012 with recurrent CVA in-hospital 10/5460 felt embolic.  . Thrombus of left atrial appendage     a. Possible LAA thrombus on TEE 04/01/12.  . Morbid obesity   . On home oxygen therapy   . Thrombocytopenia   . Restless leg   . CVA (cerebral infarction)     Followed by Dr. Leonie Man  . Hx of amiodarone therapy     Atrial fibrillation  . HX: anticoagulation     Atrial fibrillation  . Cardiomyopathy, ischemic   . Ejection fraction     EF 40% in the past   //    EF 25% January, 2014   //   EF 30%, echo, Aug 11, 2012, mid/apical anterior akinesis and anteroseptal akinesis and apical lateral akinesis and akinesis of the true apex.  . Pulmonary hypertension     PA pressure 65 mmHg, echo, Aug 11, 2012  . Sweating     May, 2014  . Dizziness     Past Surgical History    Procedure Laterality Date  . Coronary artery bypass graft  2001    x 4  vessels  . Abdominal hysterectomy    . ;    . Rhinosplasty      x 2  . Tonsillectomy    . Knee replacemnt  2000    total right  . Shoulder sx  2000    right bone spurs  . Cardiac catheterization  2013  . Tee without cardioversion  04/01/2012    Procedure: TRANSESOPHAGEAL ECHOCARDIOGRAM (TEE);  Surgeon: Thayer Headings, MD;  Location: [facility];  Service: Cardiovascular;  Laterality: N/A;    Patient Active Problem List   Diagnosis Date Noted  . Bradycardia 05/31/2013  . H/O amiodarone therapy 02/14/2013  . Dizziness   . Sweating   . Arthritis   . Stroke   . Thrombus of left atrial appendage   . Morbid obesity   . On home oxygen therapy   . VT (ventricular tachycardia)   . Thrombocytopenia   . Restless leg   . CAD (coronary artery disease)   . Hypertension   . Paroxysmal atrial fibrillation   . Depression   . COPD (chronic obstructive pulmonary disease)   . Sleep apnea   . Diabetes mellitus   . Chronic kidney disease   . H/O hiatal hernia   . Headache(784.0)   . Ejection fraction   . Pulmonary hypertension   . Cardiomyopathy, ischemic   . CKD (chronic kidney disease)   . Chronic systolic heart failure 05/39/7673  . Anemia 03/26/2012    ROS   Patient denies fever, chills, headache, sweats, rash, change in vision, change in hearing, chest pain, cough, nausea or vomiting, urinary symptoms. All other systems are reviewed and are negative.  PHYSICAL EXAM  patient is overweight. She is in a wheelchair. She seems depressed. She is oriented to person time and place. She is here with a friend. Lungs are clear. Respiratory effort is nonlabored. Cardiac exam reveals S1 and S2. Abdomen is protuberant but soft. There is no significant peripheral edema. There no musculoskeletal deformities. There no skin rashes. The rhythm is regular.  Filed Vitals:   06/17/13 1432  BP: 145/74  Pulse: 47  Height: 5'  6.5" (1.689 m)  Weight: 272 lb (123.378 kg)  SpO2: 96%     ASSESSMENT & PLAN

## 2013-06-17 NOTE — Assessment & Plan Note (Signed)
Her indigestion may be multifactorial. Omeprazole be increased to twice daily. She feels poorly with amlodipine. This will be stopped. She has been on Pradaxa. This could be playing a role. We will consider changing this in the future.  As part of today's evaluation I have spent much greater than 25 minutes with her total care. More than half of 25 minutes is spent in direct contact with the patient. We have discussed her symptoms and the plan extensively.  Omeprazole will be increased to twice a day. Amlodipine will be stopped. Labs will be done including TSH, chemistries, CBC. She will have a 2-D echo to reassess LV function. Spironolactone be started at 12.5 twice a day and her labs were be followed carefully.

## 2013-06-17 NOTE — Assessment & Plan Note (Signed)
I have encouraged her to use her oxygen at home as much as she can.

## 2013-06-17 NOTE — Assessment & Plan Note (Signed)
Her EF was 30% in May, 2014. I'm hoping that there has been improvement. Echo will be done soon.

## 2013-06-20 ENCOUNTER — Telehealth: Payer: Self-pay | Admitting: Cardiology

## 2013-06-20 NOTE — Telephone Encounter (Signed)
Patient informed nurse that she now feels better and her indigestion has stopped.

## 2013-06-20 NOTE — Telephone Encounter (Signed)
Patient was seen by Dr. Ron Parker on Friday 06/17/13 and was told to call today and report how she is feeling.

## 2013-06-30 ENCOUNTER — Other Ambulatory Visit (INDEPENDENT_AMBULATORY_CARE_PROVIDER_SITE_OTHER): Payer: Medicare Other

## 2013-06-30 ENCOUNTER — Other Ambulatory Visit: Payer: Self-pay

## 2013-06-30 DIAGNOSIS — I359 Nonrheumatic aortic valve disorder, unspecified: Secondary | ICD-10-CM

## 2013-06-30 DIAGNOSIS — I5022 Chronic systolic (congestive) heart failure: Secondary | ICD-10-CM

## 2013-06-30 DIAGNOSIS — I059 Rheumatic mitral valve disease, unspecified: Secondary | ICD-10-CM

## 2013-06-30 DIAGNOSIS — I2510 Atherosclerotic heart disease of native coronary artery without angina pectoris: Secondary | ICD-10-CM

## 2013-07-04 ENCOUNTER — Encounter: Payer: Self-pay | Admitting: Cardiology

## 2013-07-06 ENCOUNTER — Telehealth: Payer: Self-pay | Admitting: *Deleted

## 2013-07-06 NOTE — Telephone Encounter (Signed)
Patient informed. 

## 2013-07-06 NOTE — Telephone Encounter (Signed)
Message copied by Merlene Laughter on Wed Jul 06, 2013  2:43 PM ------      Message from: Ron Parker, [place] D      Created: Mon Jul 04, 2013 10:39 AM       Please let the patient know that her echo did show some mild improvement. I have reviewed the blood work. That's be sure to get the second lab while she is on new spironolactone. ------

## 2013-07-18 ENCOUNTER — Encounter: Payer: Self-pay | Admitting: Cardiology

## 2013-07-18 NOTE — Progress Notes (Signed)
Patient ID: Caitlyn Hardy, female   DOB: 1945/06/21, 68 y.o.   MRN: [id number]  On March 20 spironolactone was added to her regimen. She has had a slow increase in creatinine. It is been as low of 1.2 but is now up to 1.69. I decided to stop spironolactone.  Daryel November, MD

## 2013-07-20 ENCOUNTER — Telehealth: Payer: Self-pay | Admitting: *Deleted

## 2013-07-20 NOTE — Telephone Encounter (Signed)
Message copied by Merlene Laughter on Wed Jul 20, 2013  2:54 PM ------      Message from: Ron Parker, [place] D      Created: Mon Jul 18, 2013  5:26 PM       Her creatinine is climbing on the spironolactone. But stop the spironolactone. She should have a followup lab in a month ------

## 2013-07-20 NOTE — Telephone Encounter (Signed)
Patient informed. 

## 2013-07-27 ENCOUNTER — Encounter: Payer: Self-pay | Admitting: Cardiology

## 2013-07-27 ENCOUNTER — Ambulatory Visit (INDEPENDENT_AMBULATORY_CARE_PROVIDER_SITE_OTHER): Payer: Medicare Other | Admitting: Cardiology

## 2013-07-27 VITALS — BP 131/61 | HR 45 | Ht 66.0 in | Wt 275.1 lb

## 2013-07-27 DIAGNOSIS — R1013 Epigastric pain: Secondary | ICD-10-CM

## 2013-07-27 DIAGNOSIS — I2589 Other forms of chronic ischemic heart disease: Secondary | ICD-10-CM

## 2013-07-27 DIAGNOSIS — R0989 Other specified symptoms and signs involving the circulatory and respiratory systems: Secondary | ICD-10-CM

## 2013-07-27 DIAGNOSIS — I10 Essential (primary) hypertension: Secondary | ICD-10-CM

## 2013-07-27 DIAGNOSIS — Z9981 Dependence on supplemental oxygen: Secondary | ICD-10-CM

## 2013-07-27 DIAGNOSIS — Z9229 Personal history of other drug therapy: Secondary | ICD-10-CM

## 2013-07-27 DIAGNOSIS — I498 Other specified cardiac arrhythmias: Secondary | ICD-10-CM

## 2013-07-27 DIAGNOSIS — K3189 Other diseases of stomach and duodenum: Secondary | ICD-10-CM

## 2013-07-27 DIAGNOSIS — I255 Ischemic cardiomyopathy: Secondary | ICD-10-CM

## 2013-07-27 DIAGNOSIS — R001 Bradycardia, unspecified: Secondary | ICD-10-CM

## 2013-07-27 DIAGNOSIS — K30 Functional dyspepsia: Secondary | ICD-10-CM

## 2013-07-27 DIAGNOSIS — I480 Paroxysmal atrial fibrillation: Secondary | ICD-10-CM

## 2013-07-27 DIAGNOSIS — R9430 Abnormal result of cardiovascular function study, unspecified: Secondary | ICD-10-CM

## 2013-07-27 DIAGNOSIS — I2510 Atherosclerotic heart disease of native coronary artery without angina pectoris: Secondary | ICD-10-CM

## 2013-07-27 DIAGNOSIS — I5022 Chronic systolic (congestive) heart failure: Secondary | ICD-10-CM

## 2013-07-27 DIAGNOSIS — I4891 Unspecified atrial fibrillation: Secondary | ICD-10-CM

## 2013-07-27 MED ORDER — CARVEDILOL 3.125 MG PO TABS
3.1250 mg | ORAL_TABLET | Freq: Two times a day (BID) | ORAL | Status: DC
Start: 2013-07-27 — End: 2014-05-17

## 2013-07-27 NOTE — Assessment & Plan Note (Signed)
Coronary disease is stable. No change in therapy. 

## 2013-07-27 NOTE — Assessment & Plan Note (Signed)
Indigestion improved after we stopped amlodipine. No further workup at this time. She is on omeprazole.  As part of today's evaluation I spent greater than 25 minutes with her total care. More than half of this time was with direct contact with her discussing all of the complex issues that I have outlined above.

## 2013-07-27 NOTE — Assessment & Plan Note (Addendum)
Blood pressure is controlled. No change in therapy. Her blood pressure is okay off amlodipine. We may plan to adjust other meds upwards over time.

## 2013-07-27 NOTE — Progress Notes (Signed)
Patient ID: Caitlyn Hardy, female   DOB: 1945-12-12, 68 y.o.   MRN: [id number]    HPI  Patient is seen today for followup coronary disease and cardiomyopathy. I saw her last June 17, 2013. We tried to begin spironolactone at that time. Followup labs showed her creatinine went up from 1.4-1.7. I cannot be absolutely sure that this is related to the spironolactone. However at this point I put the spironolactone on hold. Her amlodipine had been stopped. Her nausea and indigestion definitely improved. She recorded her blood pressures at home and sent him in to [place]. Her pressure is ranging 140/70. She feels fatigued. She says that when she tries to increase her exercise level her bradycardia remains the same. That is her heart rate increases only slightly with her activity. Followup two-dimensional echo was done. Her EF was 35-40%. This appears to be improving very and however the apex is not visualized well. TSH was checked at the last visit and was normal.  Allergies  Allergen Reactions  . Penicillins Anaphylaxis  . Azithromycin Hives and Rash  . Bactrim [Sulfamethoxazole-Trimethoprim] Hives and Rash  . Catapres [Clonidine Hcl] Itching and Rash  . Sulfa Antibiotics Hives and Rash    Current Outpatient Prescriptions  Medication Sig Dispense Refill  . albuterol (PROVENTIL HFA;VENTOLIN HFA) 108 (90 BASE) MCG/ACT inhaler Inhale 1-2 puffs into the lungs every 6 (six) hours as needed for wheezing or shortness of breath.      Marland Kitchen amiodarone (PACERONE) 200 MG tablet Take 0.5 tablets (100 mg total) by mouth daily.  45 tablet  3  . atorvastatin (LIPITOR) 10 MG tablet Take 1 tablet (10 mg total) by mouth daily.  30 tablet  2  . dabigatran (PRADAXA) 150 MG CAPS capsule Take 1 capsule (150 mg total) by mouth every 12 (twelve) hours.  60 capsule  11  . DIGOX 125 MCG tablet TAKE 1/2 TABLET BY MOUTH EVERY DAY  15 tablet  6  . Fe Fum-FA-B Cmp-C-Zn-Mg-Mn-Cu (HEMATINIC PLUS COMPLEX) 106-1 MG TABS Take by mouth daily.       . fluticasone (FLONASE) 50 MCG/ACT nasal spray Place 2 sprays into the nose daily.      . Fluticasone-Salmeterol (ADVAIR) 250-50 MCG/DOSE AEPB Inhale 1 puff into the lungs every 12 (twelve) hours.      Marland Kitchen HYDROcodone-acetaminophen (NORCO) 10-325 MG per tablet Take 1 tablet by mouth every 6 (six) hours as needed for pain.      Marland Kitchen insulin aspart (NOVOLOG) 100 UNIT/ML injection Sliding Scale Insulin 0-15 Units, Subcutaneous, 3 times daily with meals Correction coverage: Moderate CBG < 70: implement hypoglycemia protocol CBG 70 - 120: 0 units CBG 121 - 150: 2 units CBG 151 - 200: 3 units CBG 201 - 250: 5 units CBG 251 - 300: 8 units CBG 301 - 350: 11 units CBG 351 - 400: 15 units CBG > 400: call MD      . ipratropium-albuterol (DUONEB) 0.5-2.5 (3) MG/3ML SOLN Take 3 mLs by nebulization every 6 (six) hours as needed.      Marland Kitchen lisinopril (PRINIVIL,ZESTRIL) 20 MG tablet Take 1 tablet (20 mg total) by mouth 2 (two) times daily.      . montelukast (SINGULAIR) 10 MG tablet Take 1 tablet (10 mg total) by mouth at bedtime.  30 tablet  0  . NITROSTAT 0.4 MG SL tablet Place 0.4 mg under the tongue every 5 (five) minutes as needed.       . NON FORMULARY CPAP nightly      .  NON FORMULARY Oxygen at 2L/min while out, prn other times.      Marland Kitchen omeprazole (PRILOSEC) 40 MG capsule Take 1 capsule (40 mg total) by mouth 2 (two) times daily.  180 capsule  3  . potassium chloride SA (K-DUR,KLOR-CON) 20 MEQ tablet Take 2 tablets (40 mEq total) by mouth 2 (two) times daily.  120 tablet  2  . rOPINIRole (REQUIP) 2 MG tablet Take 2 mg by mouth at bedtime.      . sertraline (ZOLOFT) 50 MG tablet Take 50 mg by mouth daily.      . temazepam (RESTORIL) 30 MG capsule Take 30 mg by mouth at bedtime as needed for sleep.      Marland Kitchen tiotropium (SPIRIVA) 18 MCG inhalation capsule Place 18 mcg into inhaler and inhale daily.      Marland Kitchen torsemide (DEMADEX) 20 MG tablet Take 3 tablets (60 mg total) by mouth daily.  90 tablet  2  .  carvedilol (COREG) 3.125 MG tablet Take 1 tablet (3.125 mg total) by mouth 2 (two) times daily.  180 tablet  3   No current facility-administered medications for this visit.    History   Social History  . Marital Status: Single    Spouse Name: N/A    Number of Children: N/A  . Years of Education: N/A   Occupational History  . Not on file.   Social History Main Topics  . Smoking status: Former Smoker    Quit date: 04/01/1975  . Smokeless tobacco: Never Used  . Alcohol Use: No  . Drug Use: No  . Sexual Activity: Not on file   Other Topics Concern  . Not on file   Social History Narrative  . No narrative on file    History reviewed. No pertinent family history.  Past Medical History  Diagnosis Date  . CAD (coronary artery disease)     a. CABG 2001. b. PCI 2011. c. NSTEMI 12/13-03/2012 in prolonged hosp stay, felt demand isch with stable cath.  . Hypertension   . Persistent atrial fibrillation     a. Diagnosed 02/2012 - long hosp stay, difficult to control rates, LAA thrombus noted, needed emergent DCCV for VT/afib/hypotension 04/14/2012.  . Depression   . COPD (chronic obstructive pulmonary disease)   . Sleep apnea     CPAP qhs  . Diabetes mellitus   . Chronic systolic CHF (congestive heart failure)     a. EF 40% 2011, down to 25% 03/2012 felt mixed isch/nonischemic (tachy-mediated).  . Chronic kidney disease   . H/O hiatal hernia   . Headache(784.0)   . Arthritis   . Anemia   . Stroke     a. Stroke 02/2012 with recurrent CVA in-hospital 0/2725 felt embolic.  . Thrombus of left atrial appendage     a. Possible LAA thrombus on TEE 04/01/12.  . Morbid obesity   . On home oxygen therapy   . Thrombocytopenia   . Restless leg   . CVA (cerebral infarction)     Followed by Dr. Leonie Man  . Hx of amiodarone therapy     Atrial fibrillation  . HX: anticoagulation     Atrial fibrillation  . Cardiomyopathy, ischemic   . Ejection fraction     EF 40% in the past   //    EF  25% January, 2014   //   EF 30%, echo, Aug 11, 2012, mid/apical anterior akinesis and anteroseptal akinesis and apical lateral akinesis and akinesis of the true apex.  Marland Kitchen  Pulmonary hypertension     PA pressure 65 mmHg, echo, Aug 11, 2012  . Sweating     May, 2014  . Dizziness     Past Surgical History  Procedure Laterality Date  . Coronary artery bypass graft  2001    x 4 vessels  . Abdominal hysterectomy    . ;    . Rhinosplasty      x 2  . Tonsillectomy    . Knee replacemnt  2000    total right  . Shoulder sx  2000    right bone spurs  . Cardiac catheterization  2013  . Tee without cardioversion  04/01/2012    Procedure: TRANSESOPHAGEAL ECHOCARDIOGRAM (TEE);  Surgeon: Thayer Headings, MD;  Location: [facility];  Service: Cardiovascular;  Laterality: N/A;    Patient Active Problem List   Diagnosis Date Noted  . Indigestion 06/17/2013  . Bradycardia 05/31/2013  . H/O amiodarone therapy 02/14/2013  . Dizziness   . Sweating   . Arthritis   . Stroke   . Thrombus of left atrial appendage   . Morbid obesity   . On home oxygen therapy   . VT (ventricular tachycardia)   . Thrombocytopenia   . Restless leg   . CAD (coronary artery disease)   . Hypertension   . Paroxysmal atrial fibrillation   . Depression   . COPD (chronic obstructive pulmonary disease)   . Sleep apnea   . Diabetes mellitus   . Chronic kidney disease   . H/O hiatal hernia   . Headache(784.0)   . Ejection fraction   . Pulmonary hypertension   . Cardiomyopathy, ischemic   . CKD (chronic kidney disease)   . Chronic systolic heart failure 86/57/8469  . Anemia 03/26/2012    ROS   Patient denies fever, chills, headache, sweats, rash, change in vision, change in hearing, cough, nausea vomiting, urinary symptoms. All other systems are reviewed and are negative.  PHYSICAL EXAM  Patient is overweight. She is in a wheelchair today. She looks a little stronger today than previous visits. She is here with her  friend. She is oriented to person time and place. She seems somewhat depressed. Head is atraumatic. Sclera and conjunctiva appear normal. There is no jugulovenous distention. Lungs are clear. Respiratory effort is nonlabored. She is on pulsed home oxygen. Abdomen is soft. There is trace peripheral edema.  Filed Vitals:   07/27/13 1115  BP: 131/61  Pulse: 45  Height: 5\' 6"  (1.676 m)  Weight: 275 lb 1.9 oz (124.794 kg)  SpO2: 97%     ASSESSMENT & PLAN

## 2013-07-27 NOTE — Assessment & Plan Note (Signed)
Her volume status is stable. No change in therapy. 

## 2013-07-27 NOTE — Assessment & Plan Note (Signed)
She continues her home oxygen.

## 2013-07-27 NOTE — Assessment & Plan Note (Signed)
Her ejection fraction may be slightly improving. Unfortunately our legal lower her carvedilol dose because of her bradycardia

## 2013-07-27 NOTE — Assessment & Plan Note (Addendum)
She has a resting heart rate of approximately 45. She says that her heart rate does not increase when she tries to go about activities. I'm lowering her carvedilol dose. At the moment we're not proceeding with ICD placement. However, it appears that she has definite chronotropic incompetence, this may be an additional reason to proceed with ICD. Her QRS is not widened. In her case I do not know if this definitely excludes the possibility of CRT. If she received a pacemaker for her marked bradycardia she may be paced all of the time.

## 2013-07-27 NOTE — Patient Instructions (Signed)
Your physician recommends that you schedule a follow-up appointment in: 4-5 months. Your physician has recommended you make the following change in your medication:  Decrease your carvedilol to 3.125 mg twice daily. You may break your 6.25 mg tablets in half twice daily until they are finished. Continue all other medications the same.

## 2013-07-27 NOTE — Assessment & Plan Note (Signed)
Amiodarone to be continued at 100 mg daily.

## 2013-07-27 NOTE — Assessment & Plan Note (Signed)
Her rhythm is remaining regular. She's on low-dose amlodipine. This be continued. Her TSH was normal when checked recently. Amiodarone probably plays a role with her bradycardia.

## 2013-07-27 NOTE — Assessment & Plan Note (Signed)
I tried to start spironolactone. Creatinine went from 1.4-1.7. I cannot be sure if this is from the medication. However at this point we stopped the spironolactone. She is on ACE inhibitor. She is on a beta blocker but her heart rate is slow. This may be playing a role with her fatigue. Carvedilol will be decreased to 3.175 mg twice a day.

## 2013-07-29 ENCOUNTER — Other Ambulatory Visit: Payer: Self-pay

## 2013-07-29 MED ORDER — LISINOPRIL 20 MG PO TABS: 20.0000 mg | ORAL_TABLET | Freq: Two times a day (BID) | ORAL | Status: DC

## 2013-08-31 ENCOUNTER — Other Ambulatory Visit: Payer: Self-pay | Admitting: Cardiology

## 2013-09-01 ENCOUNTER — Other Ambulatory Visit: Payer: Self-pay | Admitting: Cardiology

## 2013-09-21 ENCOUNTER — Encounter: Payer: Self-pay | Admitting: Cardiology

## 2013-09-21 ENCOUNTER — Ambulatory Visit (INDEPENDENT_AMBULATORY_CARE_PROVIDER_SITE_OTHER): Payer: Medicare Other | Admitting: Cardiology

## 2013-09-21 VITALS — BP 146/72 | HR 51 | Ht 67.0 in | Wt 265.0 lb

## 2013-09-21 DIAGNOSIS — I5022 Chronic systolic (congestive) heart failure: Secondary | ICD-10-CM

## 2013-09-21 DIAGNOSIS — E876 Hypokalemia: Secondary | ICD-10-CM

## 2013-09-21 DIAGNOSIS — I498 Other specified cardiac arrhythmias: Secondary | ICD-10-CM

## 2013-09-21 DIAGNOSIS — I2510 Atherosclerotic heart disease of native coronary artery without angina pectoris: Secondary | ICD-10-CM

## 2013-09-21 DIAGNOSIS — I10 Essential (primary) hypertension: Secondary | ICD-10-CM

## 2013-09-21 DIAGNOSIS — R001 Bradycardia, unspecified: Secondary | ICD-10-CM

## 2013-09-21 MED ORDER — LISINOPRIL 20 MG PO TABS: 10.0000 mg | ORAL_TABLET | Freq: Every day | ORAL | Status: DC

## 2013-09-21 NOTE — Patient Instructions (Addendum)
   Decrease Lisinopril to 10mg  daily  Remain off Digoxin Continue all other medications.   Your physician has requested that you regularly monitor and record your blood pressure readings at home. Please take approximately 1-2 hours after medication & bring to next OV with Dr. Ron Parker.    Your physician has requested that you regularly monitor and record your blood pressure readings at home. Please use the same machine at the same time of day to check your readings and record them to bring to your follow-up visit.  Your physician recommends that you return for lab work in: 1 week   Thank you for choosing [facility]!!

## 2013-09-21 NOTE — Progress Notes (Signed)
Digoxin on hold til seen today. Concerned about KL med has not taken it in 3 days

## 2013-09-21 NOTE — Progress Notes (Signed)
Clinical Summary Caitlyn Hardy is a 68 y.o.female last seen by Dr Caitlyn Hardy, this is our first visit together. She is seen for the following medical problems  1. CAD/Chronic systolic heart failure - prior CABG in 2001,  - Cath 2013 with severe native disease but patent grafts (SVG-PDA,SVG-diag, LIMA-LAD) and patent stent in LCX. RHC at that time shoed mean RA 24, mean PA 60, CI 1.65. - 06/2013 echo LVEF 35-40%, grade II diastolic dysfunction, multiple WMAs, PASP 63.   - recently Dr Caitlyn Hardy tried aldactone but Cr increased from 1.4 to 1.7 and it was stopped. Beta blocker therapy has been limited due to bradycardia, last visit coreg decreased to 3.125mg  bid  - stable SOB, DOE at <1 block. No LE edema, chronic 2 pillow orthopnea. No specific chest pain.  - compliant with meds, limiting salt intake. Reports torsemide was decreased to 40mg  bid after recent admission due to  AKI  2. Afib - on amio, coreg, and pradaxa. Digoxin was stopped during recent admission due to bradycardia - no palpitations, can have some lightheadedness with standing. Occurs approx once a day.   3. HTN - checks bp regularly, can be as low as 90/50s, on high end can be around 140s/60s.   4. COPD - on home oxygen  5. Abdominal pain - admit Caitlyn Hardy 08/2013 with RUQ abdominal pain - no evidence of ACS as atypical cause of symptoms - no clear cause from workup per record review, symptoms did improve after soap suds enema. No specific cause by CT or HIDA scan. EGD showed gastritis and esophagitis, she was started on PPI.   6. AKI - also had AKI during that recent admission, peak Cr 2, possible due to contrast nephropathy according to notes. ACE and diuretic held initially. Cr trended down to 1.1 at discharge.  Past Medical History  Diagnosis Date  . CAD (coronary artery disease)     a. CABG 2001. b. PCI 2011. c. NSTEMI 12/13-03/2012 in prolonged hosp stay, felt demand isch with stable cath.  . Hypertension   .  Persistent atrial fibrillation     a. Diagnosed 02/2012 - long hosp stay, difficult to control rates, LAA thrombus noted, needed emergent DCCV for VT/afib/hypotension 04/14/2012.  . Depression   . COPD (chronic obstructive pulmonary disease)   . Sleep apnea     CPAP qhs  . Diabetes mellitus   . Chronic systolic CHF (congestive heart failure)     a. EF 40% 2011, down to 25% 03/2012 felt mixed isch/nonischemic (tachy-mediated).  . Chronic kidney disease   . H/O hiatal hernia   . Headache(784.0)   . Arthritis   . Anemia   . Stroke     a. Stroke 02/2012 with recurrent CVA in-hospital 10/3660 felt embolic.  . Thrombus of left atrial appendage     a. Possible LAA thrombus on TEE 04/01/12.  . Morbid obesity   . On home oxygen therapy   . Thrombocytopenia   . Restless leg   . CVA (cerebral infarction)     Followed by Dr. Leonie Hardy  . Hx of amiodarone therapy     Atrial fibrillation  . HX: anticoagulation     Atrial fibrillation  . Cardiomyopathy, ischemic   . Ejection fraction     EF 40% in the past   //    EF 25% January, 2014   //   EF 30%, echo, Aug 11, 2012, mid/apical anterior akinesis and anteroseptal akinesis and apical lateral akinesis and  akinesis of the true apex.  . Pulmonary hypertension     PA pressure 65 mmHg, echo, Aug 11, 2012  . Sweating     May, 2014  . Dizziness      Allergies  Allergen Reactions  . Penicillins Anaphylaxis  . Azithromycin Hives and Rash  . Bactrim [Sulfamethoxazole-Trimethoprim] Hives and Rash  . Catapres [Clonidine Hcl] Itching and Rash  . Sulfa Antibiotics Hives and Rash     Current Outpatient Prescriptions  Medication Sig Dispense Refill  . albuterol (PROVENTIL HFA;VENTOLIN HFA) 108 (90 BASE) MCG/ACT inhaler Inhale 1-2 puffs into the lungs every 6 (six) hours as needed for wheezing or shortness of breath.      Marland Kitchen amiodarone (PACERONE) 200 MG tablet Take 0.5 tablets (100 mg total) by mouth daily.  45 tablet  3  . atorvastatin (LIPITOR) 10 MG  tablet Take 1 tablet (10 mg total) by mouth daily.  30 tablet  2  . carvedilol (COREG) 3.125 MG tablet Take 1 tablet (3.125 mg total) by mouth 2 (two) times daily.  180 tablet  3  . dabigatran (PRADAXA) 150 MG CAPS capsule Take 1 capsule (150 mg total) by mouth every 12 (twelve) hours.  60 capsule  11  . DIGOX 125 MCG tablet TAKE 1/2 TABLET BY MOUTH EVERY DAY  15 tablet  6  . Fe Fum-FA-B Cmp-C-Zn-Mg-Mn-Cu (HEMATINIC PLUS COMPLEX) 106-1 MG TABS Take by mouth daily.      . fluticasone (FLONASE) 50 MCG/ACT nasal spray Place 2 sprays into the nose daily.      . Fluticasone-Salmeterol (ADVAIR) 250-50 MCG/DOSE AEPB Inhale 1 puff into the lungs every 12 (twelve) hours.      Marland Kitchen HYDROcodone-acetaminophen (NORCO) 10-325 MG per tablet Take 1 tablet by mouth every 6 (six) hours as needed for pain.      Marland Kitchen insulin aspart (NOVOLOG) 100 UNIT/ML injection Sliding Scale Insulin 0-15 Units, Subcutaneous, 3 times daily with meals Correction coverage: Moderate CBG < 70: implement hypoglycemia protocol CBG 70 - 120: 0 units CBG 121 - 150: 2 units CBG 151 - 200: 3 units CBG 201 - 250: 5 units CBG 251 - 300: 8 units CBG 301 - 350: 11 units CBG 351 - 400: 15 units CBG > 400: call MD      . ipratropium-albuterol (DUONEB) 0.5-2.5 (3) MG/3ML SOLN Take 3 mLs by nebulization every 6 (six) hours as needed.      Marland Kitchen lisinopril (PRINIVIL,ZESTRIL) 20 MG tablet TAKE 1 TABLET BY MOUTH TWICE DAILY  180 tablet  3  . montelukast (SINGULAIR) 10 MG tablet Take 1 tablet (10 mg total) by mouth at bedtime.  30 tablet  0  . NITROSTAT 0.4 MG SL tablet DISSOLVE 1 TABLET UNDER TONGUE AS DIRECTED AS NEEDED  100 tablet  0  . NON FORMULARY CPAP nightly      . NON FORMULARY Oxygen at 2L/min while out, prn other times.      Marland Kitchen omeprazole (PRILOSEC) 40 MG capsule Take 1 capsule (40 mg total) by mouth 2 (two) times daily.  180 capsule  3  . potassium chloride SA (K-DUR,KLOR-CON) 20 MEQ tablet Take 2 tablets (40 mEq total) by mouth 2 (two) times  daily.  120 tablet  2  . rOPINIRole (REQUIP) 2 MG tablet Take 2 mg by mouth at bedtime.      . sertraline (ZOLOFT) 50 MG tablet Take 50 mg by mouth daily.      . temazepam (RESTORIL) 30 MG capsule Take 30 mg by  mouth at bedtime as needed for sleep.      Marland Kitchen tiotropium (SPIRIVA) 18 MCG inhalation capsule Place 18 mcg into inhaler and inhale daily.      Marland Kitchen torsemide (DEMADEX) 20 MG tablet Take 3 tablets (60 mg total) by mouth daily.  90 tablet  2   No current facility-administered medications for this visit.     Past Surgical History  Procedure Laterality Date  . Coronary artery bypass graft  2001    x 4 vessels  . Abdominal hysterectomy    . ;    . Rhinosplasty      x 2  . Tonsillectomy    . Knee replacemnt  2000    total right  . Shoulder sx  2000    right bone spurs  . Cardiac catheterization  2013  . Tee without cardioversion  04/01/2012    Procedure: TRANSESOPHAGEAL ECHOCARDIOGRAM (TEE);  Surgeon: Thayer Headings, MD;  Location: [place];  Service: Cardiovascular;  Laterality: N/A;     Allergies  Allergen Reactions  . Penicillins Anaphylaxis  . Azithromycin Hives and Rash  . Bactrim [Sulfamethoxazole-Trimethoprim] Hives and Rash  . Catapres [Clonidine Hcl] Itching and Rash  . Sulfa Antibiotics Hives and Rash      No family history on file.   Social History Caitlyn Hardy reports that she quit smoking about 38 years ago. She has never used smokeless tobacco. Caitlyn Hardy reports that she does not drink alcohol.   Review of Systems CONSTITUTIONAL: No weight loss, fever, chills, weakness or fatigue.  HEENT: Eyes: No visual loss, blurred vision, double vision or yellow sclerae.No hearing loss, sneezing, congestion, runny nose or sore throat.  SKIN: No rash or itching.  CARDIOVASCULAR: per HPI RESPIRATORY: No shortness of breath, cough or sputum.  GASTROINTESTINAL: No anorexia, nausea, vomiting or diarrhea. No abdominal pain or blood.  GENITOURINARY: No burning on  urination, no polyuria NEUROLOGICAL: +dizziness  MUSCULOSKELETAL: No muscle, back pain, joint pain or stiffness.  LYMPHATICS: No enlarged nodes. No history of splenectomy.  PSYCHIATRIC: No history of depression or anxiety.  ENDOCRINOLOGIC: No reports of sweating, cold or heat intolerance. No polyuria or polydipsia.  Marland Kitchen   Physical Examination p51 bp 146/72 Wt 265 lbs BMI 42 Gen: resting comfortably, no acute distress HEENT: no scleral icterus, pupils equal round and reactive, no palptable cervical adenopathy,  CV: regular, rate 50, no m/r/g Resp: Clear to auscultation bilaterally GI: abdomen is soft, non-tender, non-distended, normal bowel sounds, no hepatosplenomegaly MSK: extremities are warm, no edema.  Skin: warm, no rash Neuro:  no focal deficits Psych: appropriate affect   Diagnostic Studies 06/2013 Echo Study Conclusions  - Procedure narrative: Transthoracic echocardiography. Image quality was poor. Endocardium was poorly visualized. - Left ventricle: The cavity size was mildly dilated. Systolic function was difficult to assess but appeared moderately reduced. The estimated ejection fraction was in the range of 35% to 40%. The apex was not visualized. Moderate posterior wall and severe basal septal hypertrophy. Probable grade II diastolic dysfunction. - Regional wall motion abnormality: Akinesis of the apical myocardium; severe hypokinesis of the mid anterior, mid anteroseptal, apical septal, and apical lateral myocardium; moderate hypokinesis of the mid anterolateral myocardium. - Aortic valve: Mildly calcified annulus. Mildly thickened, mildly calcified leaflets. No clear evidence of stenosis. - Mitral valve: Calcified annulus. Mildly thickened leaflets . Mild regurgitation. - Left atrium: The atrium was moderately dilated. - Tricuspid valve: Mild regurgitation. Peak RV-RA gradient: 25mm Hg (S). Moderate to severely elevated pulmonary pressures. -  Systemic veins:  IVC is dilated with normal respiratory variation. Estimated CVP 8 mmHg.   2013 [facility] Procedural Findings:  Hemodynamics  RA 24  RV 82/25  PA 87/44 mean 60  PCWP not recorded  AO 119/68  LV 113/16  Oxygen saturations:  PA 31  AO 92  Cardiac Output (Fick) 4.1 L/m  Cardiac Index (Fick) 1.65 L/m/square meter  Coronary angiography:  Coronary dominance: right  Left mainstem: Patent with 30% distal left main stenosis  Left anterior descending (LAD): Totally occluded.  Left circumflex (LCx): Widely patent. The proximal and mid vessel is stented with no significant in-stent restenosis. There is a large obtuse marginal branch without significant stenosis.  Right coronary artery (RCA): Diffusely diseased in the mid vessel leading into a total occlusion in the distal vessel.  SVG to diagonal: The vein graft is widely patent with mild irregularity. The diagonal ranch itself is very small with no significant stenosis.  SVG to right PDA: Widely patent throughout. No significant stenosis. The PDA branch is patent and this retrograde fills other subbranches of the right coronary artery.  LIMA to LAD: Widely patent with no significant stenosis. The mid and distal LAD have mild diffuse disease without significant stenosis. This graft retrograde fills a large diagonal branch without significant stenosis.  Left ventriculography: deferred  Final Conclusions:  1. severe native three-vessel coronary artery disease  2. Continued patency of the saphenous vein graft to PDA, saphenous vein graft to diagonal, and LIMA to LAD  3. Continued patency of the stented segment in the left circumflex  4. Severe congestive heart failure with markedly elevated right heart pressures and low cardiac output  Recommendations:  Discussed with Dr. Aundra Dubin. The patient will be managed by the heart failure service. I'm going to start her on IV amiodarone for rate control. She will be started on unfractionated heparin and warfarin  for anticoagulation. Tentatively plan on TEE cardioversion January 2. Will start IV furosemide for diuresis. Will need to watch QTc with this patient on Amiodarone and Levaquin. Would consider stopping digoxin over next few days to limit potential medication toxicity.    Assessment and Plan  1. CAD/Chronic systolic heart failure - at baseline from functional standpoint, appears euvolemic - medical therapy limited to bradycardia, reports low blood pressures at home with some dizziness. Will decrease lisinopril to 10mg  daily. Try to continue coreg, ultimately with her resting bradycardia depending on her dizziness symptoms may have to consider stopping in the future, try to continue for now - check BMET in 2 weeks to follow up renal function and K  2. Afib - no current symptoms, continue current meds  3. HTN - reported low bp's at home at times, will decrease lisinopril to 10mg  daily - she is to keep a bp log and bring next visit  4. Sinus bradycardia - no off dig, will try to continue low dose beta blocker due to the significant benefits. Follow heart rates and symptoms  F/u Dr Caitlyn Hardy 1 month    Arnoldo Lenis, M.D., F.A.C.C.

## 2013-09-26 ENCOUNTER — Ambulatory Visit: Payer: Medicare Other | Admitting: Nurse Practitioner

## 2013-10-02 ENCOUNTER — Other Ambulatory Visit: Payer: Self-pay | Admitting: Cardiology

## 2013-10-04 ENCOUNTER — Ambulatory Visit (INDEPENDENT_AMBULATORY_CARE_PROVIDER_SITE_OTHER): Payer: Medicare Other | Admitting: Nurse Practitioner

## 2013-10-04 ENCOUNTER — Encounter: Payer: Self-pay | Admitting: Nurse Practitioner

## 2013-10-04 VITALS — BP 148/72 | HR 51

## 2013-10-04 DIAGNOSIS — I6350 Cerebral infarction due to unspecified occlusion or stenosis of unspecified cerebral artery: Secondary | ICD-10-CM

## 2013-10-04 DIAGNOSIS — I639 Cerebral infarction, unspecified: Secondary | ICD-10-CM

## 2013-10-04 DIAGNOSIS — G4730 Sleep apnea, unspecified: Secondary | ICD-10-CM

## 2013-10-04 DIAGNOSIS — I2510 Atherosclerotic heart disease of native coronary artery without angina pectoris: Secondary | ICD-10-CM

## 2013-10-04 MED ORDER — LIDOCAINE 5 % EX PTCH: 1.0000 | MEDICATED_PATCH | CUTANEOUS | Status: DC

## 2013-10-04 NOTE — Patient Instructions (Signed)
Resume pradaxa after procedure tomorrow Strict control of hypertension with systolic blood pressure 254/98 Hemoglobin A1c 6.5 or less Wear CPAP  every night for obstructive sleep apnea Lidocaine patch 5% take as directed Followup in 6 months

## 2013-10-04 NOTE — Progress Notes (Signed)
[facility]  PATIENT: Caitlyn Hardy DOB: Dec 12, 1945   REASON FOR VISIT: Followup for stroke December 2013   HISTORY OF PRESENT ILLNESS:Caitlyn Hardy is a 68 year old Caucasian lady who is seen for followup. Last seen 11/11/12.Hospital admission for stroke in December 2013. She was admitted on 03/25/12 transferred from [facility] where she was admitted initially with a cerebellar stroke. She was found to have low ejection fraction and new onset of atrial fibrillation. She was transferred to [facility] on 03/25/12 for further management. She was initially started on Coumadin which was held due to transition to IV heparin in preparation for heart catheterization. She developed significant worsening of Atrial fibrillation and rate control requiring direct cardioversion. TEE on 04/01/12 showed a small thrombus at the tip of the atrial appendage. She had been on warfarin with INR 1.8 when she developed another stroke on 04/03/12 with acute change in mental status, dysarthria nausea and decreased responsiveness. She could not get TPA due to recent stroke and anticoagulation. She was subsequently changed to pradaxa. Her hospital course was complicated with several episodes of congestive heart failure, rapid atrial fibrillation and decompensation. She also had a few episodes of ventricle tachycardia and as stated above required emergency cardioversion. Her condition gradually stabilized and she was transferred for rehabilitation to nursing home for 20 days and has gradually done well. She is now at home. She is able to ambulate at home with a walker with a seat but states her stamina is not good and she gets tired easily. She is seated in electric wheelchair today. She has also had frequent falls and balance is not good. She is tolerating pradaxa well without significant bleeding but does have easy bruisability of her skin. She states her fasting sugars have been good and  blood  pressure is well controlled .  10/04/13 returns for followup without further stroke or TIA symptoms. She is tolerating pradaxa well without significant bleeding.She is off due to   endoscopy tomorrow then she will resume . She states her fasting sugars are less than 150. Blood pressure today was 148/72. She is using her CPAP at night, and she uses her oxygen 2L continously.  She continues to tire easily. She is currently living at home with assistance. She has no new neurologic complaints. She uses Lidoderm patch for postherpetic pain. She returns for reevaluation  REVIEW OF SYSTEMS: Full 14 system review of systems performed and notable only for those listed, all others are neg:  Constitutional: Fatigue Cardiovascular: Chest pain, leg swelling Ear/Nose/Throat: Hearing Loss, trouble swallowing Skin: N/A  Eyes: N/A  Respiratory: Shortness of breath Gastroitestinal: N/A  Hematology/Lymphatic: N/A  Endocrine: N/A Musculoskeletal: Joint pain, back pain, muscle cramps Allergy/Immunology: N/A  Neurological: Numbness Psychiatric: Depression anxiety Sleep : Obstructive sleep apnea with CPAP  ALLERGIES: Allergies  Allergen Reactions  . Penicillins Anaphylaxis  . Azithromycin Hives and Rash  . Bactrim [Sulfamethoxazole-Trimethoprim] Hives and Rash  . Catapres [Clonidine Hcl] Itching and Rash  . Clonidine Derivatives Itching and Rash  . Sulfa Antibiotics Hives and Rash    HOME MEDICATIONS: Outpatient Prescriptions Prior to Visit  Medication Sig Dispense Refill  . albuterol (PROVENTIL HFA;VENTOLIN HFA) 108 (90 BASE) MCG/ACT inhaler Inhale 1-2 puffs into the lungs every 6 (six) hours as needed for wheezing or shortness of breath.      Marland Kitchen amiodarone (PACERONE) 200 MG tablet Take 0.5 tablets (100 mg total) by mouth daily.  45 tablet  3  . atorvastatin (LIPITOR) 10 MG  tablet Take 1 tablet (10 mg total) by mouth daily.  30 tablet  2  . carvedilol (COREG) 3.125 MG tablet Take 1 tablet (3.125 mg total)  by mouth 2 (two) times daily.  180 tablet  3  . dabigatran (PRADAXA) 150 MG CAPS capsule Take 1 capsule (150 mg total) by mouth every 12 (twelve) hours.  60 capsule  11  . Fe Fum-FA-B Cmp-C-Zn-Mg-Mn-Cu (HEMATINIC PLUS COMPLEX) 106-1 MG TABS Take by mouth daily.      . fluticasone (FLONASE) 50 MCG/ACT nasal spray Place 2 sprays into the nose daily.      . Fluticasone-Salmeterol (ADVAIR) 250-50 MCG/DOSE AEPB Inhale 1 puff into the lungs every 12 (twelve) hours.      Marland Kitchen HYDROcodone-acetaminophen (NORCO) 10-325 MG per tablet Take 1 tablet by mouth every 6 (six) hours as needed for pain.      Marland Kitchen insulin aspart (NOVOLOG) 100 UNIT/ML injection Sliding Scale Insulin 0-15 Units, Subcutaneous, 3 times daily with meals Correction coverage: Moderate CBG < 70: implement hypoglycemia protocol CBG 70 - 120: 0 units CBG 121 - 150: 2 units CBG 151 - 200: 3 units CBG 201 - 250: 5 units CBG 251 - 300: 8 units CBG 301 - 350: 11 units CBG 351 - 400: 15 units CBG > 400: call MD      . ipratropium-albuterol (DUONEB) 0.5-2.5 (3) MG/3ML SOLN Take 3 mLs by nebulization every 6 (six) hours as needed.      Marland Kitchen lisinopril (PRINIVIL,ZESTRIL) 20 MG tablet Take 0.5 tablets (10 mg total) by mouth daily.      . montelukast (SINGULAIR) 10 MG tablet Take 1 tablet (10 mg total) by mouth at bedtime.  30 tablet  0  . NITROSTAT 0.4 MG SL tablet DISSOLVE 1 TABLET UNDER TONGUE AS DIRECTED AS NEEDED  100 tablet  0  . NON FORMULARY CPAP nightly      . NON FORMULARY Oxygen at 2L/min while out, prn other times.      Marland Kitchen omeprazole (PRILOSEC) 40 MG capsule Take 1 capsule (40 mg total) by mouth 2 (two) times daily.  180 capsule  3  . potassium chloride SA (K-DUR,KLOR-CON) 20 MEQ tablet Take 2 tablets (40 mEq total) by mouth 2 (two) times daily.  120 tablet  2  . rOPINIRole (REQUIP) 2 MG tablet Take 2 mg by mouth at bedtime.      . sertraline (ZOLOFT) 50 MG tablet Take 50 mg by mouth daily.      . temazepam (RESTORIL) 30 MG capsule Take 30 mg  by mouth at bedtime as needed for sleep.      Marland Kitchen tiotropium (SPIRIVA) 18 MCG inhalation capsule Place 18 mcg into inhaler and inhale daily.      Marland Kitchen torsemide (DEMADEX) 20 MG tablet Take 40 mg by mouth 2 (two) times daily.      Marland Kitchen lisinopril (PRINIVIL,ZESTRIL) 20 MG tablet Take 0.5 tablets (10 mg total) by mouth daily.  45 tablet  0   No facility-administered medications prior to visit.    PAST MEDICAL HISTORY: Past Medical History  Diagnosis Date  . CAD (coronary artery disease)     a. CABG 2001. b. PCI 2011. c. NSTEMI 12/13-03/2012 in prolonged hosp stay, felt demand isch with stable cath.  . Hypertension   . Persistent atrial fibrillation     a. Diagnosed 02/2012 - long hosp stay, difficult to control rates, LAA thrombus noted, needed emergent DCCV for VT/afib/hypotension 04/14/2012.  . Depression   . COPD (chronic obstructive  pulmonary disease)   . Sleep apnea     CPAP qhs  . Diabetes mellitus   . Chronic systolic CHF (congestive heart failure)     a. EF 40% 2011, down to 25% 03/2012 felt mixed isch/nonischemic (tachy-mediated).  . Chronic kidney disease   . H/O hiatal hernia   . Headache(784.0)   . Arthritis   . Anemia   . Stroke     a. Stroke 02/2012 with recurrent CVA in-hospital 05/4095 felt embolic.  . Thrombus of left atrial appendage     a. Possible LAA thrombus on TEE 04/01/12.  . Morbid obesity   . On home oxygen therapy   . Thrombocytopenia   . Restless leg   . CVA (cerebral infarction)     Followed by Dr. Leonie Man  . Hx of amiodarone therapy     Atrial fibrillation  . HX: anticoagulation     Atrial fibrillation  . Cardiomyopathy, ischemic   . Ejection fraction     EF 40% in the past   //    EF 25% January, 2014   //   EF 30%, echo, Aug 11, 2012, mid/apical anterior akinesis and anteroseptal akinesis and apical lateral akinesis and akinesis of the true apex.  . Pulmonary hypertension     PA pressure 65 mmHg, echo, Aug 11, 2012  . Sweating     May, 2014  . Dizziness       PAST SURGICAL HISTORY: Past Surgical History  Procedure Laterality Date  . Coronary artery bypass graft  2001    x 4 vessels  . Abdominal hysterectomy    . ;    . Rhinosplasty      x 2  . Tonsillectomy    . Knee replacemnt  2000    total right  . Shoulder sx  2000    right bone spurs  . Cardiac catheterization  2013  . Tee without cardioversion  04/01/2012    Procedure: TRANSESOPHAGEAL ECHOCARDIOGRAM (TEE);  Surgeon: Thayer Headings, MD;  Location: [place];  Service: Cardiovascular;  Laterality: N/A;    FAMILY HISTORY: History reviewed. No pertinent family history.  SOCIAL HISTORY: History   Social History  . Marital Status: Single    Spouse Name: N/A    Number of Children: 4  . Years of Education: GED   Occupational History  . Not on file.   Social History Main Topics  . Smoking status: Former Smoker    Quit date: 04/01/1975  . Smokeless tobacco: Never Used  . Alcohol Use: No  . Drug Use: No  . Sexual Activity: Not on file   Other Topics Concern  . Not on file   Social History Narrative   Patient is single with 4 children   Patient has a GED   Patient is right handed   Patient drinks 2 cups daily     PHYSICAL EXAM  Filed Vitals:   10/04/13 1444 10/04/13 1455  BP: 152/70 148/72  Pulse: 51 51   Cannot calculate BMI with a height equal to zero. General: well developed, morbidly obese female seated, in no evident distress  Head: head normocephalic and atraumatic. Oropharynx benign  Neck: supple with no carotid bruits  Cardiovascular: regular rate and rhythm, no murmurs  Skin minimal bruising, pedal edema1+.  Neurologic Exam  Mental Status: Awake and fully alert. Oriented to place and time. Follows all commands. Speech and language normal.  Cranial Nerves:  Pupils equal, briskly reactive to light. Extraocular movements full without  nystagmus. Visual fields full to confrontation. Hearing intact and symmetric to finger snap. Facial sensation  intact. Face, tongue, palate move normally and symmetrically. Neck flexion and extension normal.  Motor: Normal bulk and tone. Normal strength in all tested extremity muscles.No focal weakness  Sensory.: intact to touch and pinprick and vibratory.  Coordination: Rapid alternating movements normal in all extremities. Finger-to-nose  performed accurately bilaterally.  Gait and Station: Not ambulated in wheelchair  Reflexes: 1+ and symmetric. Toes downgoing.     DIAGNOSTIC DATA (LABS, IMAGING, TESTING) - I reviewed patient records, labs, notes, testing and imaging myself where available.  Lab Results  Component Value Date   WBC 7.0 12/13/2012   HGB 12.8 12/13/2012   HCT 38.0 12/13/2012   MCV 86.9 12/13/2012   PLT 209.0 12/13/2012      Component Value Date/Time   NA 140 12/13/2012 1231   K 3.7 12/13/2012 1231   CL 99 12/13/2012 1231   CO2 34* 12/13/2012 1231   GLUCOSE 126* 12/13/2012 1231   BUN 18 12/13/2012 1231   CREATININE 1.2 12/13/2012 1231   CALCIUM 9.1 12/13/2012 1231   PROT 6.6 12/13/2012 1231   ALBUMIN 4.0 12/13/2012 1231   AST 30 12/13/2012 1231   ALT 25 12/13/2012 1231   ALKPHOS 55 12/13/2012 1231   BILITOT 0.6 12/13/2012 1231   GFRNONAA 36* 04/19/2012 0329   GFRAA 42* 04/19/2012 0329   Lab Results  Component Value Date   CHOL 88 04/06/2012   HDL 26* 04/06/2012   LDLCALC 44 04/06/2012   TRIG 90 04/06/2012   CHOLHDL 3.4 04/06/2012    Lab Results  Component Value Date   VITAMINB12 420 04/06/2012   Lab Results  Component Value Date   TSH 4.58 12/13/2012      ASSESSMENT AND PLAN  68 y.o. year old female  has a past medical history of CAD (coronary artery disease); Hypertension; Persistent atrial fibrillation;  COPD (chronic obstructive pulmonary disease); Sleep apnea; Diabetes mellitus; Chronic systolic CHF (congestive heart failure); Chronic kidney disease;   Anemia; Stroke; Thrombus of left atrial appendage; Morbid obesity; On home oxygen therapy; Thrombocytopenia; Restless leg; CVA  (cerebral infarction); amiodarone therapy; anticoagulation; Cardiomyopathy, ischemic; Ejection fraction; Pulmonary hypertension; Sweating; and Dizziness. here to followup for cerebellar infarct 2013 December. Multiple vascular risk factors of atrial fibrillation hypertension diabetes sleep apnea obesity congestive heart failure ejection fraction and left atrial thrombus  Resume pradaxa after procedure tomorrow(endoscopy) Strict control of hypertension with systolic blood pressure 923/30 Hemoglobin A1c 6.5 or less Wear CPAP  every night for obstructive sleep apnea Lidocaine patch 5% take as directed Followup in 6 months Dennie Bible, [facility], [facility], APRN  [facility] [address] [place], [place] [address] [phone]

## 2013-10-07 ENCOUNTER — Other Ambulatory Visit: Payer: Self-pay | Admitting: Physician Assistant

## 2013-10-09 ENCOUNTER — Other Ambulatory Visit: Payer: Self-pay | Admitting: Cardiology

## 2013-10-09 NOTE — Progress Notes (Signed)
I agree with the above plan 

## 2013-10-10 ENCOUNTER — Other Ambulatory Visit: Payer: Self-pay | Admitting: Cardiology

## 2013-10-11 ENCOUNTER — Telehealth: Payer: Self-pay | Admitting: Nurse Practitioner

## 2013-10-11 NOTE — Telephone Encounter (Signed)
I called the patient back to obtain further info.  Got no answer.  Left message.

## 2013-10-12 NOTE — Telephone Encounter (Signed)
I spoke with the pharmacy.  They provided the Rx ins info of [employer] [phone] ID # [id number].  We have contacted ins to ask that they grant an exception and cover this drug.  They will notify both [place] and the patient of an outcome once it has been determined.  I called the patient back.  Got no answer.  Left message.

## 2013-10-12 NOTE — Telephone Encounter (Signed)
Patient calling to state that she needs prior authorization on her Lidoderm patches. Please return call to patient and advise.

## 2013-10-12 NOTE — Telephone Encounter (Signed)
I

## 2013-10-13 ENCOUNTER — Telehealth: Payer: Self-pay | Admitting: Neurology

## 2013-10-13 NOTE — Telephone Encounter (Signed)
I called back.  Spoke with Magda Paganini.  She said the insurance will be faxing [place] a letter with explanation of benefits for coverage review determination.  Nothing further is required at this time.

## 2013-10-13 NOTE — Telephone Encounter (Signed)
Clair Gulling from [employer] calling to get information about medications that patient has taken in the past, please return call and advise. The case number is [id number].

## 2013-10-19 ENCOUNTER — Telehealth: Payer: Self-pay

## 2013-10-19 NOTE — Telephone Encounter (Signed)
[employer] sent [place] a fax stating they would like additional info faxed to them to appeal non-preferred Lidocaine patch.  All clinical info has been faxed to them at [phone].  I called the patient regarding this, she was not available.  Left message.

## 2013-10-27 ENCOUNTER — Telehealth: Payer: Self-pay

## 2013-10-27 NOTE — Telephone Encounter (Signed)
[employer] notified us they have approved our request for coverage on Lidocaine Patch effective until 03/30/2014 Ref Appeal Case # [id number] Approval # [id number]

## 2013-10-31 ENCOUNTER — Ambulatory Visit (INDEPENDENT_AMBULATORY_CARE_PROVIDER_SITE_OTHER): Payer: Medicare Other | Admitting: Cardiology

## 2013-10-31 ENCOUNTER — Encounter: Payer: Self-pay | Admitting: Cardiology

## 2013-10-31 VITALS — BP 135/75 | HR 55 | Ht 67.0 in | Wt 281.0 lb

## 2013-10-31 DIAGNOSIS — I498 Other specified cardiac arrhythmias: Secondary | ICD-10-CM

## 2013-10-31 DIAGNOSIS — R001 Bradycardia, unspecified: Secondary | ICD-10-CM

## 2013-10-31 DIAGNOSIS — I480 Paroxysmal atrial fibrillation: Secondary | ICD-10-CM

## 2013-10-31 DIAGNOSIS — I2510 Atherosclerotic heart disease of native coronary artery without angina pectoris: Secondary | ICD-10-CM

## 2013-10-31 DIAGNOSIS — I5022 Chronic systolic (congestive) heart failure: Secondary | ICD-10-CM

## 2013-10-31 DIAGNOSIS — I25810 Atherosclerosis of coronary artery bypass graft(s) without angina pectoris: Secondary | ICD-10-CM

## 2013-10-31 DIAGNOSIS — I4891 Unspecified atrial fibrillation: Secondary | ICD-10-CM

## 2013-10-31 DIAGNOSIS — I255 Ischemic cardiomyopathy: Secondary | ICD-10-CM

## 2013-10-31 DIAGNOSIS — I2589 Other forms of chronic ischemic heart disease: Secondary | ICD-10-CM

## 2013-10-31 NOTE — Patient Instructions (Signed)
Your physician recommends that you schedule a follow-up appointment in: 3 months. You will receive a reminder letter in the mail in about 1-2 months reminding you to call and schedule your appointment. If you don't receive this letter, please contact our office. Your physician recommends that you continue on your current medications as directed. Please refer to the Current Medication list given to you today. 

## 2013-10-31 NOTE — Assessment & Plan Note (Signed)
Her rate is running 55. I am not able to change her meds.

## 2013-10-31 NOTE — Progress Notes (Signed)
Patient ID: Caitlyn Hardy, female   DOB: 08-27-1945, 68 y.o.   MRN: [id number]    HPI    The patient is seen today to followup multiple cardiac issues. I saw her last April, 2015. She was hospitalized in [place] with a GI question. She was seen for me by Dr.Branch in the office September 21, 2013. He outlined the medicine changes that had been made in the hospital. He also lowered her lisinopril dose because of some dizziness. She has checked her pressure at home. Systolic ranges in the 160 range. Her heart rate is 55 on her current medicines. She actually looks the best today in the office that I have seen her in a long time. She's not having any chest pain or shortness of breath.  Allergies  Allergen Reactions  . Penicillins Anaphylaxis  . Azithromycin Hives and Rash  . Bactrim [Sulfamethoxazole-Trimethoprim] Hives and Rash  . Catapres [Clonidine Hcl] Itching and Rash  . Clonidine Derivatives Itching and Rash  . Sulfa Antibiotics Hives and Rash    Current Outpatient Prescriptions  Medication Sig Dispense Refill  . albuterol (PROVENTIL HFA;VENTOLIN HFA) 108 (90 BASE) MCG/ACT inhaler Inhale 1-2 puffs into the lungs every 6 (six) hours as needed for wheezing or shortness of breath.      Marland Kitchen amiodarone (PACERONE) 200 MG tablet Take 0.5 tablets (100 mg total) by mouth daily.  45 tablet  3  . atorvastatin (LIPITOR) 10 MG tablet Take 1 tablet (10 mg total) by mouth daily.  30 tablet  2  . carvedilol (COREG) 3.125 MG tablet Take 1 tablet (3.125 mg total) by mouth 2 (two) times daily.  180 tablet  3  . dabigatran (PRADAXA) 150 MG CAPS capsule Take 1 capsule (150 mg total) by mouth every 12 (twelve) hours.  60 capsule  11  . Fe Fum-FA-B Cmp-C-Zn-Mg-Mn-Cu (HEMATINIC PLUS COMPLEX) 106-1 MG TABS Take by mouth daily.      . fluticasone (FLONASE) 50 MCG/ACT nasal spray Place 2 sprays into the nose daily.      . Fluticasone-Salmeterol (ADVAIR) 250-50 MCG/DOSE AEPB Inhale 1 puff into the lungs every 12 (twelve)  hours.      Marland Kitchen HYDROcodone-acetaminophen (NORCO) 10-325 MG per tablet Take 1 tablet by mouth every 6 (six) hours as needed for pain.      Marland Kitchen insulin aspart (NOVOLOG) 100 UNIT/ML injection Sliding Scale Insulin 0-15 Units, Subcutaneous, 3 times daily with meals Correction coverage: Moderate CBG < 70: implement hypoglycemia protocol CBG 70 - 120: 0 units CBG 121 - 150: 2 units CBG 151 - 200: 3 units CBG 201 - 250: 5 units CBG 251 - 300: 8 units CBG 301 - 350: 11 units CBG 351 - 400: 15 units CBG > 400: call MD      . ipratropium-albuterol (DUONEB) 0.5-2.5 (3) MG/3ML SOLN Take 3 mLs by nebulization every 6 (six) hours as needed.      . lidocaine (LIDODERM) 5 % Place 1 patch onto the skin daily. Remove & Discard patch within 12 hours or as directed by MD  30 patch  6  . lisinopril (PRINIVIL,ZESTRIL) 20 MG tablet Take 0.5 tablets (10 mg total) by mouth daily.      Marland Kitchen lisinopril (PRINIVIL,ZESTRIL) 20 MG tablet Take 0.5 tablets (10 mg total) by mouth daily.  45 tablet  3  . montelukast (SINGULAIR) 10 MG tablet Take 1 tablet (10 mg total) by mouth at bedtime.  30 tablet  0  . NITROSTAT 0.4 MG SL tablet DISSOLVE 1  TABLET UNDER TONGUE AS DIRECTED AS NEEDED.  100 tablet  0  . NON FORMULARY CPAP nightly      . NON FORMULARY Oxygen at 2L/min while out, prn other times.      Marland Kitchen omeprazole (PRILOSEC) 40 MG capsule Take 1 capsule (40 mg total) by mouth 2 (two) times daily.  180 capsule  3  . potassium chloride SA (K-DUR,KLOR-CON) 20 MEQ tablet Take 2 tablets (40 mEq total) by mouth 2 (two) times daily.  120 tablet  2  . PRADAXA 75 MG CAPS capsule TAKE ONE CAPSULE BY MOUTH EVERY 12 HOURS  60 capsule  0  . rOPINIRole (REQUIP) 2 MG tablet Take 2 mg by mouth at bedtime.      . sertraline (ZOLOFT) 50 MG tablet Take 50 mg by mouth daily.      . temazepam (RESTORIL) 30 MG capsule Take 30 mg by mouth at bedtime as needed for sleep.      Marland Kitchen tiotropium (SPIRIVA) 18 MCG inhalation capsule Place 18 mcg into inhaler and  inhale daily.      Marland Kitchen torsemide (DEMADEX) 20 MG tablet Take 40 mg by mouth 2 (two) times daily.       No current facility-administered medications for this visit.    History   Social History  . Marital Status: Single    Spouse Name: N/A    Number of Children: 4  . Years of Education: GED   Occupational History  . Not on file.   Social History Main Topics  . Smoking status: Former Smoker    Quit date: 04/01/1975  . Smokeless tobacco: Never Used  . Alcohol Use: No  . Drug Use: No  . Sexual Activity: Not on file   Other Topics Concern  . Not on file   Social History Narrative   Patient is single with 4 children   Patient has a GED   Patient is right handed   Patient drinks 2 cups daily    History reviewed. No pertinent family history.  Past Medical History  Diagnosis Date  . CAD (coronary artery disease)     a. CABG 2001. b. PCI 2011. c. NSTEMI 12/13-03/2012 in prolonged hosp stay, felt demand isch with stable cath.  . Hypertension   . Persistent atrial fibrillation     a. Diagnosed 02/2012 - long hosp stay, difficult to control rates, LAA thrombus noted, needed emergent DCCV for VT/afib/hypotension 04/14/2012.  . Depression   . COPD (chronic obstructive pulmonary disease)   . Sleep apnea     CPAP qhs  . Diabetes mellitus   . Chronic systolic CHF (congestive heart failure)     a. EF 40% 2011, down to 25% 03/2012 felt mixed isch/nonischemic (tachy-mediated).  . Chronic kidney disease   . H/O hiatal hernia   . Headache(784.0)   . Arthritis   . Anemia   . Stroke     a. Stroke 02/2012 with recurrent CVA in-hospital 06/7094 felt embolic.  . Thrombus of left atrial appendage     a. Possible LAA thrombus on TEE 04/01/12.  . Morbid obesity   . On home oxygen therapy   . Thrombocytopenia   . Restless leg   . CVA (cerebral infarction)     Followed by Dr. Leonie Man  . Hx of amiodarone therapy     Atrial fibrillation  . HX: anticoagulation     Atrial fibrillation  .  Cardiomyopathy, ischemic   . Ejection fraction     EF 40% in  the past   //    EF 25% January, 2014   //   EF 30%, echo, Aug 11, 2012, mid/apical anterior akinesis and anteroseptal akinesis and apical lateral akinesis and akinesis of the true apex.  . Pulmonary hypertension     PA pressure 65 mmHg, echo, Aug 11, 2012  . Sweating     May, 2014  . Dizziness     Past Surgical History  Procedure Laterality Date  . Coronary artery bypass graft  2001    x 4 vessels  . Abdominal hysterectomy    . ;    . Rhinosplasty      x 2  . Tonsillectomy    . Knee replacemnt  2000    total right  . Shoulder sx  2000    right bone spurs  . Cardiac catheterization  2013  . Tee without cardioversion  04/01/2012    Procedure: TRANSESOPHAGEAL ECHOCARDIOGRAM (TEE);  Surgeon: Thayer Headings, MD;  Location: [place];  Service: Cardiovascular;  Laterality: N/A;    Patient Active Problem List   Diagnosis Date Noted  . Indigestion 06/17/2013  . Bradycardia 05/31/2013  . H/O amiodarone therapy 02/14/2013  . Dizziness   . Sweating   . Arthritis   . Stroke   . Thrombus of left atrial appendage   . Morbid obesity   . On home oxygen therapy   . VT (ventricular tachycardia)   . Thrombocytopenia   . Restless leg   . CAD (coronary artery disease)   . Hypertension   . Paroxysmal atrial fibrillation   . Depression   . COPD (chronic obstructive pulmonary disease)   . Sleep apnea   . Diabetes mellitus   . Chronic kidney disease   . H/O hiatal hernia   . Headache(784.0)   . Ejection fraction   . Pulmonary hypertension   . Cardiomyopathy, ischemic   . CKD (chronic kidney disease)   . Chronic systolic heart failure 66/59/9357  . Anemia 03/26/2012    ROS   Patient denies fever, chills, headache, sweats, rash, change in vision, change in hearing, chest pain, cough, nausea vomiting, urinary symptoms. All other systems are reviewed and are negative.  PHYSICAL EXAM  Patient is here with her friend.  She is oriented to person time and place. Affect is normal. She's wearing portable oxygen. She is in a wheelchair. Overall she looks good. Head is atraumatic. Sclera and conjunctiva are normal. There is no jugulovenous distention. She is significantly overweight. Lungs are clear. Respiratory effort is nonlabored. Cardiac exam reveals S1 and S2. The abdomen is soft. There is no significant peripheral edema.  Filed Vitals:   10/31/13 1247  BP: 135/75  Pulse: 55  Height: 5\' 7"  (1.702 m)  Weight: 281 lb (127.461 kg)  SpO2: 97%     ASSESSMENT & PLAN

## 2013-10-31 NOTE — Assessment & Plan Note (Signed)
Her volume status is stable today. No change in therapy. On my note from April, 2015 are reviewed extensively her multiple issues. She appears to be quite stable today. I've chosen not to make any further changes as of today.

## 2013-10-31 NOTE — Assessment & Plan Note (Signed)
Plan to continue to watch her over time. I am not approaching the issue of an ICD were CRT therapy as of this visit.

## 2013-10-31 NOTE — Assessment & Plan Note (Signed)
Coronary disease is stable. No change in therapy. 

## 2013-12-22 ENCOUNTER — Other Ambulatory Visit: Payer: Self-pay | Admitting: Internal Medicine

## 2014-01-02 ENCOUNTER — Other Ambulatory Visit: Payer: Self-pay | Admitting: Cardiology

## 2014-01-31 ENCOUNTER — Ambulatory Visit: Payer: Medicare Other | Admitting: Cardiology

## 2014-02-13 ENCOUNTER — Encounter: Payer: Self-pay | Admitting: Cardiology

## 2014-02-13 ENCOUNTER — Ambulatory Visit (INDEPENDENT_AMBULATORY_CARE_PROVIDER_SITE_OTHER): Payer: Medicare Other | Admitting: Cardiology

## 2014-02-13 VITALS — BP 123/75 | HR 51 | Ht 67.0 in | Wt 258.0 lb

## 2014-02-13 DIAGNOSIS — I255 Ischemic cardiomyopathy: Secondary | ICD-10-CM

## 2014-02-13 DIAGNOSIS — I10 Essential (primary) hypertension: Secondary | ICD-10-CM

## 2014-02-13 DIAGNOSIS — I5022 Chronic systolic (congestive) heart failure: Secondary | ICD-10-CM | POA: Diagnosis not present

## 2014-02-13 DIAGNOSIS — I480 Paroxysmal atrial fibrillation: Secondary | ICD-10-CM | POA: Diagnosis not present

## 2014-02-13 DIAGNOSIS — I2510 Atherosclerotic heart disease of native coronary artery without angina pectoris: Secondary | ICD-10-CM

## 2014-02-13 DIAGNOSIS — Z79899 Other long term (current) drug therapy: Secondary | ICD-10-CM

## 2014-02-13 NOTE — Assessment & Plan Note (Signed)
Her rhythm is stable. She is on amiodarone. No change in therapy.

## 2014-02-13 NOTE — Patient Instructions (Signed)
   Labs for CMET, CBC, TSH Office will contact with results via phone or letter.    Continue all current medications. Your physician wants you to follow up in: 6 months.  You will receive a reminder letter in the mail one-two months in advance.  If you don't receive a letter, please call our office to schedule the follow up appointment

## 2014-02-13 NOTE — Assessment & Plan Note (Signed)
Blood pressure is well controlled. No change in therapy.

## 2014-02-13 NOTE — Assessment & Plan Note (Signed)
Her volume status is stable today. No change in therapy.

## 2014-02-13 NOTE — Assessment & Plan Note (Signed)
Patient is on the meds that she can tolerate. I feel it is most prudent not to push towards ICD or CRT. She has shown some improvement over time. I feel we should give her more time in this regard. In the future we may consider another trial of spironolactone. I've chosen not to push her other meds any further as of today.

## 2014-02-13 NOTE — Progress Notes (Signed)
Patient ID: Caitlyn Hardy, female   DOB: 09-26-1945, 68 y.o.   MRN: [id number]    HPI  patient is seen today to follow-up coronary disease and cardiomyopathy. I saw her last August, 2015. She's holding sinus rhythm on a small dose of amiodarone. She is walking now and walks as much as a mile a day. This is excellent for her. She has a cardiomyopathy. Her last echo was technically very limited. It is difficult to be sure if her ejection fraction is 35% or 40%. She is on the doses of the medicines that she can tolerate. She has dizziness if we push her meds any higher. Spironolactone was tried. Her creatinine went up from 1.4-1.7. This was around the time of other difficulties. It is possible that spironolactone could be tried again in the future.  Allergies  Allergen Reactions  . Penicillins Anaphylaxis  . Azithromycin Hives and Rash  . Bactrim [Sulfamethoxazole-Trimethoprim] Hives and Rash  . Catapres [Clonidine Hcl] Itching and Rash  . Clonidine Derivatives Itching and Rash  . Sulfa Antibiotics Hives and Rash    Current Outpatient Prescriptions  Medication Sig Dispense Refill  . albuterol (PROVENTIL HFA;VENTOLIN HFA) 108 (90 BASE) MCG/ACT inhaler Inhale 1-2 puffs into the lungs every 6 (six) hours as needed for wheezing or shortness of breath.    Marland Kitchen amiodarone (PACERONE) 200 MG tablet Take 0.5 tablets (100 mg total) by mouth daily. 45 tablet 3  . atorvastatin (LIPITOR) 10 MG tablet Take 1 tablet (10 mg total) by mouth daily. 30 tablet 2  . carvedilol (COREG) 3.125 MG tablet Take 1 tablet (3.125 mg total) by mouth 2 (two) times daily. 180 tablet 3  . Fe Fum-FA-B Cmp-C-Zn-Mg-Mn-Cu (HEMATINIC PLUS COMPLEX) 106-1 MG TABS Take by mouth daily.    . fluticasone (FLONASE) 50 MCG/ACT nasal spray Place 2 sprays into the nose daily.    . Fluticasone-Salmeterol (ADVAIR) 250-50 MCG/DOSE AEPB Inhale 1 puff into the lungs every 12 (twelve) hours.    Marland Kitchen HYDROcodone-acetaminophen (NORCO) 10-325 MG per tablet  Take 1 tablet by mouth every 6 (six) hours as needed for pain.    Marland Kitchen insulin aspart (NOVOLOG) 100 UNIT/ML injection Sliding Scale Insulin 0-15 Units, Subcutaneous, 3 times daily with meals Correction coverage: Moderate CBG < 70: implement hypoglycemia protocol CBG 70 - 120: 0 units CBG 121 - 150: 2 units CBG 151 - 200: 3 units CBG 201 - 250: 5 units CBG 251 - 300: 8 units CBG 301 - 350: 11 units CBG 351 - 400: 15 units CBG > 400: call MD    . ipratropium-albuterol (DUONEB) 0.5-2.5 (3) MG/3ML SOLN Take 3 mLs by nebulization every 6 (six) hours as needed.    . lidocaine (LIDODERM) 5 % Place 1 patch onto the skin daily. Remove & Discard patch within 12 hours or as directed by MD 30 patch 6  . lisinopril (PRINIVIL,ZESTRIL) 20 MG tablet Take 0.5 tablets (10 mg total) by mouth daily.    Marland Kitchen lisinopril (PRINIVIL,ZESTRIL) 20 MG tablet Take 0.5 tablets (10 mg total) by mouth daily. 45 tablet 3  . montelukast (SINGULAIR) 10 MG tablet Take 1 tablet (10 mg total) by mouth at bedtime. 30 tablet 0  . nitroGLYCERIN (NITROSTAT) 0.4 MG SL tablet Place 1 tablet (0.4 mg total) under the tongue every 5 (five) minutes x 3 doses as needed for chest pain. 100 tablet 0  . NON FORMULARY CPAP nightly    . NON FORMULARY Oxygen at 2L/min while out, prn other times.    Marland Kitchen  omeprazole (PRILOSEC) 40 MG capsule Take 1 capsule (40 mg total) by mouth 2 (two) times daily. 180 capsule 3  . potassium chloride SA (K-DUR,KLOR-CON) 20 MEQ tablet Take 2 tablets (40 mEq total) by mouth 2 (two) times daily. 120 tablet 2  . PRADAXA 150 MG CAPS capsule TAKE ONE CAPSULE BY MOUTH EVERY 12 HOURS 60 capsule 2  . PRADAXA 75 MG CAPS capsule TAKE ONE CAPSULE BY MOUTH EVERY 12 HOURS 60 capsule 0  . rOPINIRole (REQUIP) 2 MG tablet Take 2 mg by mouth at bedtime.    . sertraline (ZOLOFT) 50 MG tablet Take 50 mg by mouth daily.    . temazepam (RESTORIL) 30 MG capsule Take 30 mg by mouth at bedtime as needed for sleep.    Marland Kitchen tiotropium (SPIRIVA) 18  MCG inhalation capsule Place 18 mcg into inhaler and inhale daily.    Marland Kitchen torsemide (DEMADEX) 20 MG tablet Take 40 mg by mouth 2 (two) times daily.     No current facility-administered medications for this visit.    History   Social History  . Marital Status: Single    Spouse Name: N/A    Number of Children: 4  . Years of Education: GED   Occupational History  . Not on file.   Social History Main Topics  . Smoking status: Former Smoker    Start date: 08/15/1963    Quit date: 04/01/1975  . Smokeless tobacco: Never Used  . Alcohol Use: No  . Drug Use: No  . Sexual Activity: Not on file   Other Topics Concern  . Not on file   Social History Narrative   Patient is single with 4 children   Patient has a GED   Patient is right handed   Patient drinks 2 cups daily    History reviewed. No pertinent family history.  Past Medical History  Diagnosis Date  . CAD (coronary artery disease)     a. CABG 2001. b. PCI 2011. c. NSTEMI 12/13-03/2012 in prolonged hosp stay, felt demand isch with stable cath.  . Hypertension   . Persistent atrial fibrillation     a. Diagnosed 02/2012 - long hosp stay, difficult to control rates, LAA thrombus noted, needed emergent DCCV for VT/afib/hypotension 04/14/2012.  . Depression   . COPD (chronic obstructive pulmonary disease)   . Sleep apnea     CPAP qhs  . Diabetes mellitus   . Chronic systolic CHF (congestive heart failure)     a. EF 40% 2011, down to 25% 03/2012 felt mixed isch/nonischemic (tachy-mediated).  . Chronic kidney disease   . H/O hiatal hernia   . Headache(784.0)   . Arthritis   . Anemia   . Stroke     a. Stroke 02/2012 with recurrent CVA in-hospital 08/3783 felt embolic.  . Thrombus of left atrial appendage     a. Possible LAA thrombus on TEE 04/01/12.  . Morbid obesity   . On home oxygen therapy   . Thrombocytopenia   . Restless leg   . CVA (cerebral infarction)     Followed by Dr. Leonie Man  . Hx of amiodarone therapy      Atrial fibrillation  . HX: anticoagulation     Atrial fibrillation  . Cardiomyopathy, ischemic   . Ejection fraction     EF 40% in the past   //    EF 25% January, 2014   //   EF 30%, echo, Aug 11, 2012, mid/apical anterior akinesis and anteroseptal akinesis and apical lateral  akinesis and akinesis of the true apex.  . Pulmonary hypertension     PA pressure 65 mmHg, echo, Aug 11, 2012  . Sweating     May, 2014  . Dizziness     Past Surgical History  Procedure Laterality Date  . Coronary artery bypass graft  2001    x 4 vessels  . Abdominal hysterectomy    . ;    . Rhinosplasty      x 2  . Tonsillectomy    . Knee replacemnt  2000    total right  . Shoulder sx  2000    right bone spurs  . Cardiac catheterization  2013  . Tee without cardioversion  04/01/2012    Procedure: TRANSESOPHAGEAL ECHOCARDIOGRAM (TEE);  Surgeon: Thayer Headings, MD;  Location: [place];  Service: Cardiovascular;  Laterality: N/A;    Patient Active Problem List   Diagnosis Date Noted  . Indigestion 06/17/2013  . Bradycardia 05/31/2013  . H/O amiodarone therapy 02/14/2013  . Dizziness   . Sweating   . Arthritis   . Stroke   . Thrombus of left atrial appendage   . Morbid obesity   . On home oxygen therapy   . VT (ventricular tachycardia)   . Thrombocytopenia   . Restless leg   . CAD (coronary artery disease)   . Hypertension   . Paroxysmal atrial fibrillation   . Depression   . COPD (chronic obstructive pulmonary disease)   . Sleep apnea   . Diabetes mellitus   . Chronic kidney disease   . H/O hiatal hernia   . Headache(784.0)   . Ejection fraction   . Pulmonary hypertension   . Cardiomyopathy, ischemic   . CKD (chronic kidney disease)   . Chronic systolic heart failure 54/27/0623  . Anemia 03/26/2012    ROS   patient denies fever, chills, headache, sweats, rash, change in vision, change in hearing, chest pain, cough, nausea or vomiting, urinary symptoms. All other systems are  reviewed and are negative.  PHYSICAL EXAM  patient is overweight but she is had significant weight loss. She is here with her friend. She is oriented to person time and place. Affect is normal. Head is atraumatic. Sclerae and conjunctivae are normal. There is no jugulovenous distention. Lungs are clear. Respiratory effort is nonlabored. Cardiac exam reveals S1 and S2. The rhythm is regular. The abdomen is soft. There is no peripheral edema. There are no musculoskeletal deformities. There are no skin rashes.  Filed Vitals:   02/13/14 1400  BP: 123/75  Pulse: 51  Height: 5\' 7"  (1.702 m)  Weight: 258 lb (117.028 kg)  SpO2: 95%      ASSESSMENT & PLAN

## 2014-02-15 ENCOUNTER — Encounter: Payer: Self-pay | Admitting: Neurology

## 2014-02-16 ENCOUNTER — Other Ambulatory Visit: Payer: Self-pay | Admitting: Cardiology

## 2014-02-21 ENCOUNTER — Encounter: Payer: Self-pay | Admitting: Neurology

## 2014-03-01 ENCOUNTER — Telehealth: Payer: Self-pay | Admitting: *Deleted

## 2014-03-01 NOTE — Telephone Encounter (Signed)
-----   Message from Carlena Bjornstad, MD sent at 02/22/2014  4:52 PM EST ----- Please notify patient that the labs are good

## 2014-03-01 NOTE — Telephone Encounter (Signed)
Patient informed. 

## 2014-03-09 ENCOUNTER — Encounter (HOSPITAL_COMMUNITY): Payer: Self-pay | Admitting: Cardiovascular Disease

## 2014-03-15 ENCOUNTER — Other Ambulatory Visit: Payer: Self-pay | Admitting: Internal Medicine

## 2014-03-15 NOTE — Telephone Encounter (Signed)
Fill please

## 2014-03-15 NOTE — Telephone Encounter (Signed)
Patient requesting refill of Pradaxa, hasn't had blood work done since 12/13/2012. Please advise.

## 2014-03-15 NOTE — Telephone Encounter (Signed)
Error

## 2014-04-07 ENCOUNTER — Ambulatory Visit: Payer: Medicare Other | Admitting: Neurology

## 2014-05-12 ENCOUNTER — Telehealth: Payer: Self-pay | Admitting: Cardiology

## 2014-05-12 NOTE — Telephone Encounter (Signed)
Patient is concerned about her pulse readings. Would like to know what she can do.   BP 93/50 PULSE 62   Yesterday pulse 92   120 night before last

## 2014-05-16 NOTE — Telephone Encounter (Signed)
Patient was given an appointment to see Dr. Ron Parker tomorrow.

## 2014-05-17 ENCOUNTER — Ambulatory Visit (INDEPENDENT_AMBULATORY_CARE_PROVIDER_SITE_OTHER): Payer: Medicare Other | Admitting: Cardiology

## 2014-05-17 ENCOUNTER — Encounter: Payer: Self-pay | Admitting: Cardiology

## 2014-05-17 VITALS — BP 118/80 | HR 122 | Ht 67.0 in | Wt 288.0 lb

## 2014-05-17 DIAGNOSIS — Z9229 Personal history of other drug therapy: Secondary | ICD-10-CM

## 2014-05-17 DIAGNOSIS — I5022 Chronic systolic (congestive) heart failure: Secondary | ICD-10-CM

## 2014-05-17 DIAGNOSIS — I255 Ischemic cardiomyopathy: Secondary | ICD-10-CM

## 2014-05-17 DIAGNOSIS — I25810 Atherosclerosis of coronary artery bypass graft(s) without angina pectoris: Secondary | ICD-10-CM

## 2014-05-17 DIAGNOSIS — I480 Paroxysmal atrial fibrillation: Secondary | ICD-10-CM

## 2014-05-17 DIAGNOSIS — Z09 Encounter for follow-up examination after completed treatment for conditions other than malignant neoplasm: Secondary | ICD-10-CM

## 2014-05-17 DIAGNOSIS — R000 Tachycardia, unspecified: Secondary | ICD-10-CM

## 2014-05-17 MED ORDER — AMIODARONE HCL 200 MG PO TABS: 400.0000 mg | ORAL_TABLET | Freq: Two times a day (BID) | ORAL | Status: DC

## 2014-05-17 MED ORDER — CARVEDILOL 6.25 MG PO TABS: 6.2500 mg | ORAL_TABLET | Freq: Two times a day (BID) | ORAL | Status: DC

## 2014-05-17 NOTE — Patient Instructions (Signed)
Your physician recommends that you schedule a follow-up appointment in: based off EKG results next week. Your physician has recommended you make the following change in your medication:  Increase your carvedilol to 6.25 mg twice daily. You may take (2) of your 3.125 mg tablets twice daily until they are finished. Increase amiodarone to 400 mg twice daily for 1 week, then; reduce to 200 mg twice daily Continue all other medications the same. Your physician recommends that you have lab work today to check your BMET and TSH. Please come to the office next Wednesday, 05/24/14 to have an EKG only. Please do not leave the office until Dr. Ron Parker has been contacted by cell phone with a plan.

## 2014-05-17 NOTE — Assessment & Plan Note (Signed)
Coronary disease is stable. No change in therapy. 

## 2014-05-17 NOTE — Assessment & Plan Note (Signed)
Her volume status is stable. No change in therapy. 

## 2014-05-17 NOTE — Assessment & Plan Note (Signed)
For now she is on the appropriate medications. I've been hesitant to try spironolactone.

## 2014-05-17 NOTE — Progress Notes (Signed)
Cardiology Office Note   Date:  05/17/2014   ID:  Caitlyn Hardy, DOB 1945/08/31, MRN [id number]  PCP:  [name], MD  Cardiologist:  Dola Argyle, MD   Chief Complaint  Patient presents with  . Appointment    Follow-up cardiomyopathy      History of Present Illness: Caitlyn Hardy is a 69 y.o. female who presents today to follow-up ischemic cardiomyopathy. I saw her last November, 2015. I have been considering the addition of spironolactone. Her labs checked in November were stable including TSH. She is on a small dose of amiodarone. Originally the plan was to see her back in 6 months. However over the past several weeks she's not felt as well. She's had slight increase in shortness of breath. She has noted some increase in heart rate. She is added on today for earlier follow-up. She does not feel any distinct palpitations.    Past Medical History  Diagnosis Date  . CAD (coronary artery disease)     a. CABG 2001. b. PCI 2011. c. NSTEMI 12/13-03/2012 in prolonged hosp stay, felt demand isch with stable cath.  . Hypertension   . Persistent atrial fibrillation     a. Diagnosed 02/2012 - long hosp stay, difficult to control rates, LAA thrombus noted, needed emergent DCCV for VT/afib/hypotension 04/14/2012.  . Depression   . COPD (chronic obstructive pulmonary disease)   . Sleep apnea     CPAP qhs  . Diabetes mellitus   . Chronic systolic CHF (congestive heart failure)     a. EF 40% 2011, down to 25% 03/2012 felt mixed isch/nonischemic (tachy-mediated).  . Chronic kidney disease   . H/O hiatal hernia   . Headache(784.0)   . Arthritis   . Anemia   . Stroke     a. Stroke 02/2012 with recurrent CVA in-hospital 10/2503 felt embolic.  . Thrombus of left atrial appendage     a. Possible LAA thrombus on TEE 04/01/12.  . Morbid obesity   . On home oxygen therapy   . Thrombocytopenia   . Restless leg   . CVA (cerebral infarction)     Followed by Dr. Leonie Man  . Hx of amiodarone  therapy     Atrial fibrillation  . HX: anticoagulation     Atrial fibrillation  . Cardiomyopathy, ischemic   . Ejection fraction     EF 40% in the past   //    EF 25% January, 2014   //   EF 30%, echo, Aug 11, 2012, mid/apical anterior akinesis and anteroseptal akinesis and apical lateral akinesis and akinesis of the true apex.  . Pulmonary hypertension     PA pressure 65 mmHg, echo, Aug 11, 2012  . Sweating     May, 2014  . Dizziness     Past Surgical History  Procedure Laterality Date  . Coronary artery bypass graft  2001    x 4 vessels  . Abdominal hysterectomy    . ;    . Rhinosplasty      x 2  . Tonsillectomy    . Knee replacemnt  2000    total right  . Shoulder sx  2000    right bone spurs  . Cardiac catheterization  2013  . Tee without cardioversion  04/01/2012    Procedure: TRANSESOPHAGEAL ECHOCARDIOGRAM (TEE);  Surgeon: Thayer Headings, MD;  Location: [place];  Service: Cardiovascular;  Laterality: N/A;  . Left and right heart catheterization with coronary/graft angiogram N/A 03/30/2012  Procedure: LEFT AND RIGHT HEART CATHETERIZATION WITH Beatrix Fetters;  Surgeon: Sherren Mocha, MD;  Location: [facility] CATH LAB;  Service: Cardiovascular;  Laterality: N/A;    Patient Active Problem List   Diagnosis Date Noted  . Indigestion 06/17/2013  . Bradycardia 05/31/2013  . H/O amiodarone therapy 02/14/2013  . Dizziness   . Sweating   . Arthritis   . Stroke   . Thrombus of left atrial appendage   . Morbid obesity   . On home oxygen therapy   . VT (ventricular tachycardia)   . Thrombocytopenia   . Restless leg   . CAD (coronary artery disease)   . Hypertension   . Paroxysmal atrial fibrillation   . Depression   . COPD (chronic obstructive pulmonary disease)   . Sleep apnea   . Diabetes mellitus   . Chronic kidney disease   . H/O hiatal hernia   . Headache(784.0)   . Ejection fraction   . Pulmonary hypertension   . Cardiomyopathy, ischemic   . CKD  (chronic kidney disease)   . Chronic systolic heart failure 61/44/3154  . Anemia 03/26/2012      Current Outpatient Prescriptions  Medication Sig Dispense Refill  . albuterol (PROVENTIL HFA;VENTOLIN HFA) 108 (90 BASE) MCG/ACT inhaler Inhale 1-2 puffs into the lungs every 6 (six) hours as needed for wheezing or shortness of breath.    Marland Kitchen amiodarone (PACERONE) 200 MG tablet Take 2 tablets (400 mg total) by mouth 2 (two) times daily. X's 1 week; then reduce to 200 mg twice daily 210 tablet 3  . atorvastatin (LIPITOR) 10 MG tablet Take 1 tablet (10 mg total) by mouth daily. 30 tablet 2  . Fe Fum-FA-B Cmp-C-Zn-Mg-Mn-Cu (HEMATINIC PLUS COMPLEX) 106-1 MG TABS Take by mouth daily.    . fluticasone (FLONASE) 50 MCG/ACT nasal spray Place 2 sprays into the nose daily.    . Fluticasone-Salmeterol (ADVAIR) 250-50 MCG/DOSE AEPB Inhale 1 puff into the lungs every 12 (twelve) hours.    Marland Kitchen HYDROcodone-acetaminophen (NORCO) 10-325 MG per tablet Take 1 tablet by mouth every 6 (six) hours as needed for pain.    Marland Kitchen insulin aspart (NOVOLOG) 100 UNIT/ML injection Sliding Scale Insulin 0-15 Units, Subcutaneous, 3 times daily with meals Correction coverage: Moderate CBG < 70: implement hypoglycemia protocol CBG 70 - 120: 0 units CBG 121 - 150: 2 units CBG 151 - 200: 3 units CBG 201 - 250: 5 units CBG 251 - 300: 8 units CBG 301 - 350: 11 units CBG 351 - 400: 15 units CBG > 400: call MD    . ipratropium-albuterol (DUONEB) 0.5-2.5 (3) MG/3ML SOLN Take 3 mLs by nebulization every 6 (six) hours as needed.    . lidocaine (LIDODERM) 5 % Place 1 patch onto the skin daily. Remove & Discard patch within 12 hours or as directed by MD 30 patch 6  . lisinopril (PRINIVIL,ZESTRIL) 20 MG tablet Take 0.5 tablets (10 mg total) by mouth daily.    Marland Kitchen lisinopril (PRINIVIL,ZESTRIL) 20 MG tablet Take 0.5 tablets (10 mg total) by mouth daily. 45 tablet 3  . montelukast (SINGULAIR) 10 MG tablet Take 1 tablet (10 mg total) by mouth at  bedtime. 30 tablet 0  . NITROSTAT 0.4 MG SL tablet PLACE 1 TABLET UNDER TONGUE EVERY 5 MINUTES FOR 3 DOSES AS NEEDED FOR CHEST PAIN 100 tablet 0  . NON FORMULARY CPAP nightly    . NON FORMULARY Oxygen at 2L/min while out, prn other times.    Marland Kitchen omeprazole (PRILOSEC) 40 MG  capsule Take 1 capsule (40 mg total) by mouth 2 (two) times daily. 180 capsule 3  . potassium chloride SA (K-DUR,KLOR-CON) 20 MEQ tablet Take 2 tablets (40 mEq total) by mouth 2 (two) times daily. 120 tablet 2  . PRADAXA 150 MG CAPS capsule TAKE 1 CAPSULE BY MOUTH EVERY 12 HOURS 60 capsule 6  . rOPINIRole (REQUIP) 2 MG tablet Take 2 mg by mouth at bedtime.    . sertraline (ZOLOFT) 50 MG tablet Take 50 mg by mouth daily.    . temazepam (RESTORIL) 15 MG capsule Take 15 mg by mouth at bedtime as needed for sleep.    Marland Kitchen tiotropium (SPIRIVA) 18 MCG inhalation capsule Place 18 mcg into inhaler and inhale daily.    Marland Kitchen torsemide (DEMADEX) 20 MG tablet Take 40 mg by mouth 2 (two) times daily.    . carvedilol (COREG) 6.25 MG tablet Take 1 tablet (6.25 mg total) by mouth 2 (two) times daily. 180 tablet 3   No current facility-administered medications for this visit.    Allergies:   Penicillins; Azithromycin; Bactrim; Catapres; Clonidine derivatives; and Sulfa antibiotics    Social History:  The patient  reports that she quit smoking about 39 years ago. She started smoking about 50 years ago. She has never used smokeless tobacco. She reports that she does not drink alcohol or use illicit drugs.   Family History:  There is no significant family history of coronary disease or CHF.    ROS:  Please see the history of present illness.    Today the patient denies fever, chills, headache, sweats, rash. She says that she has a new significant floater in her eye. She has no change in hearing, chest pain, cough, nausea or vomiting, urinary symptoms. All other systems are reviewed and are negative.    PHYSICAL EXAM: VS:  BP 118/80 mmHg  Pulse  122  Ht 5\' 7"  (1.702 m)  Wt 288 lb (130.636 kg)  BMI 45.10 kg/m2  SpO2 99% , Patient is here today with her friend. She is in a wheelchair. She is wearing oxygen. Head is atraumatic. Sclera and conjunctiva are normal. There is no jugulovenous distention. Lungs are clear. Respiratory effort is not labored. Cardiac exam reveals S1 and S2. The rhythm is irregularly irregular. The rate is increased. Abdomen is soft. There is no peripheral edema. There are no musculoskeletal deformities. There are no skin rashes. Neurologic is grossly intact.  EKG:   EKG is done today and reviewed by me. There are old lateral Q waves consistent with an old lateral MI. There is new atrial fibrillation with a resting rate of 111.  Recent Labs: No results found for requested labs within last 365 days.    Lipid Panel    Component Value Date/Time   CHOL 88 04/06/2012 0443   TRIG 90 04/06/2012 0443   HDL 26* 04/06/2012 0443   CHOLHDL 3.4 04/06/2012 0443   VLDL 18 04/06/2012 0443   LDLCALC 44 04/06/2012 0443      Wt Readings from Last 3 Encounters:  05/17/14 288 lb (130.636 kg)  02/13/14 258 lb (117.028 kg)  10/31/13 281 lb (127.461 kg)      Current medicines are reviewed.  The patient understands her meds well.       ASSESSMENT AND PLAN:

## 2014-05-17 NOTE — Assessment & Plan Note (Signed)
The patient's amiodarone dose had been lowered to 100 mg daily over time. She has now reverted to atrial fibrillation. She will receive 400 mg twice a day of amiodarone for one week followed by 400 mg daily.

## 2014-05-17 NOTE — Assessment & Plan Note (Signed)
Patient has had return of atrial fibrillation. She is anticoagulated. The rate is increased. She is not unstable with this. I will increase her carvedilol from 3.125-6.25 twice a day. Her amiodarone will be reloaded at 400 twice a day for a week followed by 400 mg daily. She'll have a follow-up EKG in the office in one week and the result will be called to me. Based on the results I will make decisions about the timing of cardioversion if needed. Chemistry and TSH will be checked again.  As part of today's evaluation I spent greater than 25 minutes total with her care. More than half of this has been spent with direct contact counseling the patient about the return of atrial fibrillation and the approach that we will be taking.

## 2014-05-18 ENCOUNTER — Telehealth: Payer: Self-pay | Admitting: *Deleted

## 2014-05-18 NOTE — Telephone Encounter (Signed)
Patient informed and verbalized understanding of plan. 

## 2014-05-18 NOTE — Telephone Encounter (Signed)
-----   Message from Carlena Bjornstad, MD sent at 05/18/2014  8:20 AM EST ----- Please notify the patient that her chemistry tests suggests that it may be better to decrease her diuretic dose. She is on torsemide 40 twice a day. Please decrease this to torsemide 20 twice a day. You will be seeing her back next week to get an EKG. Please let me know what day that will be. Also, we can check with her at that time to see if she has had any fluid weight gain over the week.

## 2014-05-24 ENCOUNTER — Encounter: Payer: Self-pay | Admitting: Cardiology

## 2014-05-24 ENCOUNTER — Ambulatory Visit (INDEPENDENT_AMBULATORY_CARE_PROVIDER_SITE_OTHER): Payer: Medicare Other | Admitting: *Deleted

## 2014-05-24 VITALS — Ht 67.0 in | Wt 285.0 lb

## 2014-05-24 DIAGNOSIS — I10 Essential (primary) hypertension: Secondary | ICD-10-CM

## 2014-05-24 NOTE — Progress Notes (Signed)
MD informed that patient c/o blood in vaginal area last night x's 1. MD reviewed EKG and wants patient to be seen next week on 06/01/14, also get BMET today and reduce amiodarone to 200 mg twice daily as planned. Patient to continue to monitor bleeding.

## 2014-05-24 NOTE — Patient Instructions (Signed)
Your physician recommends that you schedule a follow-up appointment in: 1 week on Thursday, June 01, 2014 @2 :00 pm with Dr. Ron Parker. Your physician recommends that you continue on your current medications as directed. Please refer to the Current Medication list given to you today. Your physician recommends that you return for lab work in: today for [employer].

## 2014-05-25 ENCOUNTER — Telehealth: Payer: Self-pay | Admitting: Cardiology

## 2014-05-25 NOTE — Telephone Encounter (Signed)
PATIENT would like to know results from lab work

## 2014-06-01 ENCOUNTER — Encounter: Payer: Self-pay | Admitting: Cardiology

## 2014-06-01 ENCOUNTER — Telehealth: Payer: Self-pay | Admitting: Cardiology

## 2014-06-01 ENCOUNTER — Encounter: Payer: Self-pay | Admitting: *Deleted

## 2014-06-01 ENCOUNTER — Ambulatory Visit (INDEPENDENT_AMBULATORY_CARE_PROVIDER_SITE_OTHER): Payer: Medicare Other | Admitting: Cardiology

## 2014-06-01 VITALS — BP 113/76 | HR 85 | Ht 67.0 in | Wt 294.5 lb

## 2014-06-01 DIAGNOSIS — R0602 Shortness of breath: Secondary | ICD-10-CM

## 2014-06-01 DIAGNOSIS — I255 Ischemic cardiomyopathy: Secondary | ICD-10-CM

## 2014-06-01 DIAGNOSIS — I480 Paroxysmal atrial fibrillation: Secondary | ICD-10-CM

## 2014-06-01 DIAGNOSIS — J438 Other emphysema: Secondary | ICD-10-CM

## 2014-06-01 DIAGNOSIS — I25810 Atherosclerosis of coronary artery bypass graft(s) without angina pectoris: Secondary | ICD-10-CM

## 2014-06-01 DIAGNOSIS — I5022 Chronic systolic (congestive) heart failure: Secondary | ICD-10-CM

## 2014-06-01 NOTE — Telephone Encounter (Signed)
DCCV on medications at [facility] Friday, 06/02/14 @9 :30 am with Dr. Bronson Ing dx:PAF [place]

## 2014-06-01 NOTE — Telephone Encounter (Signed)
Pt has Medicare only.  No precert required

## 2014-06-01 NOTE — Assessment & Plan Note (Signed)
Patient underwent CABG in 2001, PCI in 2011, and a non-STEMI in January, 2014. The last cardiac admission involved a long hospital stay. The catheterization at that time revealed patent grafts and a patent stent to the circumflex. It was felt that she had had demand ischemia. The ejection fraction was 25%.

## 2014-06-01 NOTE — Assessment & Plan Note (Signed)
Her last echo in 2015 revealed that her EF may have improved as high as 35-40%. The study was technically limited. It was on the basis of this slightly higher EF in her overall status that I did not begin to push for ICD placement at that time. I done the best that I can with the adjustment of her medications for her cardiomyopathy.

## 2014-06-01 NOTE — Progress Notes (Signed)
Cardiology Office Note   Date:  06/01/2014   ID:  Caitlyn Hardy, DOB [birth year], MRN [id number]  PCP:  [name], MD  Cardiologist:  Dola Argyle, MD   Chief Complaint  Patient presents with  . Appointment    Follow-up atrial fibrillation      History of Present Illness: Caitlyn Hardy is a 69 y.o. female who presents today to follow-up atrial fibrillation, coronary disease, ischemic cardiomyopathy, chronic respiratory failure with home O2, CHF. When I saw her last in the office on May 17, 2014 she had returned atrial fibrillation. She has been anticoagulated. The rate was increased. I increased her carvedilol dose. I also started to reload her with amiodarone at a higher dose. She was to take 400 mg twice a day for a week followed by 200 mg twice a day. She came back into the office for an EKG a week later and she remained in atrial fibrillation. She had been on torsemide as high as 40 mg twice a day. On February 17 her BUN was 26 and creatinine 1.6. I was concerned that her torsemide dose might be too high. This was reduced 20 mg twice a day. Labs were checked a week later in her BUN remained in the same range and her creatinine remained in the 1.6 range. Potassium was 3.9. The patient has gained some weight since she was here last. A family member who is an EMT felt that she was dehydrated and encouraged her to drink more fluid. She has had some increased fluid intake. Her weight is increased. She does not feel well in general. Today her atrial fib rate is under better control.  Her overall cardiac history is complex. The problems are outlined carefully in this record in the problem list. She had an MI that was complicated. She had multiple other problems including a CVA. There was atrial fib which had responded to amiodarone. She has significant left ventricular dysfunction. I have been trying over time to titrate her medicines. She and I have intermittently discussed whether it  is time to consider an ICD.    Past Medical History  Diagnosis Date  . CAD (coronary artery disease)     a. CABG 2001. b. PCI 2011. c. NSTEMI 12/13-03/2012 in prolonged hosp stay, felt demand isch with stable cath.  . Hypertension   . Persistent atrial fibrillation     a. Diagnosed 02/2012 - long hosp stay, difficult to control rates, LAA thrombus noted, needed emergent DCCV for VT/afib/hypotension 04/14/2012.  . Depression   . COPD (chronic obstructive pulmonary disease)   . Sleep apnea     CPAP qhs  . Diabetes mellitus   . Chronic systolic CHF (congestive heart failure)     a. EF 40% 2011, down to 25% 03/2012 felt mixed isch/nonischemic (tachy-mediated).  . Chronic kidney disease   . H/O hiatal hernia   . Headache(784.0)   . Arthritis   . Anemia   . Stroke     a. Stroke 02/2012 with recurrent CVA in-hospital 08/6597 felt embolic.  . Thrombus of left atrial appendage     a. Possible LAA thrombus on TEE 04/01/12.  . Morbid obesity   . On home oxygen therapy   . Thrombocytopenia   . Restless leg   . CVA (cerebral infarction)     Followed by Dr. Leonie Man  . Hx of amiodarone therapy     Atrial fibrillation  . HX: anticoagulation     Atrial fibrillation  . Cardiomyopathy, ischemic   .  Ejection fraction     EF 40% in the past   //    EF 25% January, 2014   //   EF 30%, echo, Aug 11, 2012, mid/apical anterior akinesis and anteroseptal akinesis and apical lateral akinesis and akinesis of the true apex.  . Pulmonary hypertension     PA pressure 65 mmHg, echo, Aug 11, 2012  . Sweating     May, 2014  . Dizziness     Past Surgical History  Procedure Laterality Date  . Coronary artery bypass graft  2001    x 4 vessels  . Abdominal hysterectomy    . ;    . Rhinosplasty      x 2  . Tonsillectomy    . Knee replacemnt  2000    total right  . Shoulder sx  2000    right bone spurs  . Cardiac catheterization  2013  . Tee without cardioversion  04/01/2012    Procedure: TRANSESOPHAGEAL  ECHOCARDIOGRAM (TEE);  Surgeon: Thayer Headings, MD;  Location: [facility];  Service: Cardiovascular;  Laterality: N/A;  . Left and right heart catheterization with coronary/graft angiogram N/A 03/30/2012    Procedure: LEFT AND RIGHT HEART CATHETERIZATION WITH Beatrix Fetters;  Surgeon: Sherren Mocha, MD;  Location: [facility] C[facility] LAB;  Service: Cardiovascular;  Laterality: N/A;    Patient Active Problem List   Diagnosis Date Noted  . Indigestion 06/17/2013  . Bradycardia 05/31/2013  . H/O amiodarone therapy 02/14/2013  . Dizziness   . Sweating   . Arthritis   . Stroke   . Thrombus of left atrial appendage   . Morbid obesity   . On home oxygen therapy   . VT (ventricular tachycardia)   . Thrombocytopenia   . Restless leg   . CAD (coronary artery disease)   . Hypertension   . Paroxysmal atrial fibrillation   . Depression   . COPD (chronic obstructive pulmonary disease)   . Sleep apnea   . Diabetes mellitus   . Chronic kidney disease   . H/O hiatal hernia   . Headache(784.0)   . Ejection fraction   . Pulmonary hypertension   . Cardiomyopathy, ischemic   . Chronic systolic heart failure 50/27/7412  . Anemia 03/26/2012      Current Outpatient Prescriptions  Medication Sig Dispense Refill  . albuterol (PROVENTIL HFA;VENTOLIN HFA) 108 (90 BASE) MCG/ACT inhaler Inhale 1-2 puffs into the lungs every 6 (six) hours as needed for wheezing or shortness of breath.    Marland Kitchen amiodarone (PACERONE) 200 MG tablet Take 2 tablets (400 mg total) by mouth 2 (two) times daily. X's 1 week; then reduce to 200 mg twice daily 210 tablet 3  . atorvastatin (LIPITOR) 10 MG tablet Take 1 tablet (10 mg total) by mouth daily. 30 tablet 2  . carvedilol (COREG) 6.25 MG tablet Take 1 tablet (6.25 mg total) by mouth 2 (two) times daily. 180 tablet 3  . Fe Fum-FA-B Cmp-C-Zn-Mg-Mn-Cu (HEMATINIC PLUS COMPLEX) 106-1 MG TABS Take by mouth daily.    . fluticasone (FLONASE) 50 MCG/ACT nasal spray Place 2 sprays  into the nose daily.    . Fluticasone-Salmeterol (ADVAIR) 250-50 MCG/DOSE AEPB Inhale 1 puff into the lungs every 12 (twelve) hours.    Marland Kitchen HYDROcodone-acetaminophen (NORCO) 10-325 MG per tablet Take 1 tablet by mouth every 6 (six) hours as needed for pain.    Marland Kitchen insulin aspart (NOVOLOG) 100 UNIT/ML injection Sliding Scale Insulin 0-15 Units, Subcutaneous, 3 times daily with meals Correction coverage:  Moderate CBG < 70: implement hypoglycemia protocol CBG 70 - 120: 0 units CBG 121 - 150: 2 units CBG 151 - 200: 3 units CBG 201 - 250: 5 units CBG 251 - 300: 8 units CBG 301 - 350: 11 units CBG 351 - 400: 15 units CBG > 400: call MD    . ipratropium-albuterol (DUONEB) 0.5-2.5 (3) MG/3ML SOLN Take 3 mLs by nebulization every 6 (six) hours as needed.    . lidocaine (LIDODERM) 5 % Place 1 patch onto the skin daily. Remove & Discard patch within 12 hours or as directed by MD 30 patch 6  . lisinopril (PRINIVIL,ZESTRIL) 20 MG tablet Take 0.5 tablets (10 mg total) by mouth daily. 45 tablet 3  . montelukast (SINGULAIR) 10 MG tablet Take 1 tablet (10 mg total) by mouth at bedtime. 30 tablet 0  . NITROSTAT 0.4 MG SL tablet PLACE 1 TABLET UNDER TONGUE EVERY 5 MINUTES FOR 3 DOSES AS NEEDED FOR CHEST PAIN 100 tablet 0  . NON FORMULARY CPAP nightly    . NON FORMULARY Oxygen at 2L/min while out, prn other times.    Marland Kitchen omeprazole (PRILOSEC) 40 MG capsule Take 1 capsule (40 mg total) by mouth 2 (two) times daily. 180 capsule 3  . potassium chloride SA (K-DUR,KLOR-CON) 20 MEQ tablet Take 2 tablets (40 mEq total) by mouth 2 (two) times daily. 120 tablet 2  . PRADAXA 150 MG CAPS capsule TAKE 1 CAPSULE BY MOUTH EVERY 12 HOURS 60 capsule 6  . rOPINIRole (REQUIP) 2 MG tablet Take 2 mg by mouth at bedtime.    . sertraline (ZOLOFT) 50 MG tablet Take 50 mg by mouth daily.    . temazepam (RESTORIL) 15 MG capsule Take 15 mg by mouth at bedtime as needed for sleep.    Marland Kitchen tiotropium (SPIRIVA) 18 MCG inhalation capsule Place 18  mcg into inhaler and inhale daily.    Marland Kitchen torsemide (DEMADEX) 20 MG tablet Take 20 mg by mouth 2 (two) times daily. Dose decrease 05/18/14     No current facility-administered medications for this visit.    Allergies:   Penicillins; Azithromycin; Bactrim; Catapres; Clonidine derivatives; and Sulfa antibiotics    Social History:  The patient  reports that she quit smoking about 39 years ago. She started smoking about 50 years ago. She has never used smokeless tobacco. She reports that she does not drink alcohol or use illicit drugs.   Family History:  There is no strong family history of coronary artery disease.   ROS:  Please see the history of present illness.     The patient does not feel well today. She is not acutely ill. She denies fever, chills, headache, sweats, rash, change in vision, change in hearing, nausea or vomiting, urinary symptoms. She feels weak in general.   PHYSICAL EXAM: VS:  BP 113/76 mmHg  Pulse 85  Ht 5\' 7"  (1.702 m)  Wt 294 lb 8 oz (133.584 kg)  BMI 46.11 kg/m2  SpO2 98% , Patient is oriented to person time and place. Affect is normal. She is here with a family member. She is wearing her oxygen. Head is atraumatic. Sclera and conjunctiva are normal. There is no jugular venous distention. Lungs are clear. Respiratory effort is nonlabored. Cardiac exam reveals S1 and S2. The abdomen is obese. There is no marked peripheral edema. However her weight is up and I suspect that her volume is up. She is in a wheelchair. There are no skin rashes. Neurologic is grossly intact.  There are no musculoskeletal deformities other than her obesity.  EKG:   EKG is done today and reviewed by me. The rhythm remains atrial fibrillation. The rate is controlled. The QRS complex is not changed with evidence of an old anterolateral MI.   Recent Labs: No results found for requested labs within last 365 days.    Lipid Panel    Component Value Date/Time   CHOL 88 04/06/2012 0443   TRIG  90 04/06/2012 0443   HDL 26* 04/06/2012 0443   CHOLHDL 3.4 04/06/2012 0443   VLDL 18 04/06/2012 0443   LDLCALC 44 04/06/2012 0443      Wt Readings from Last 3 Encounters:  06/01/14 294 lb 8 oz (133.584 kg)  05/24/14 285 lb (129.275 kg)  05/17/14 288 lb (130.636 kg)      Current medicines are reviewed   The patient understands her medications.     ASSESSMENT AND PLAN:

## 2014-06-01 NOTE — Assessment & Plan Note (Signed)
Her volume status has been more difficult to control since the return of her atrial fibrillation. She had been on torsemide as high as 40 twice a day. I had then lowered it to 20 twice a day when her creatinine was noted to be in the range of 1.6. Most recently she has had a family member who is an EMT who had suggested that she was dehydrated. She he encouraged her to drink more fluid. She increased her fluids a little bit. Her weight is up. It is very difficult to fully assess her volume status. However I suspect that she is volume overloaded. I am hoping that she may improve in this regard once she is converted back to sinus rhythm.

## 2014-06-01 NOTE — Patient Instructions (Signed)
Your physician recommends that you schedule a follow-up appointment in: after cardioversion. Your physician recommends that you continue on your current medications as directed. Please refer to the Current Medication list given to you today. Your physician recommends that you have lab work today to check your [facility] for pre-cardioversion lab work. A chest x-ray takes a picture of the organs and structures inside the chest, including the heart, lungs, and blood vessels. This test can show several things, including, whether the heart is enlarges; whether fluid is building up in the lungs; and whether pacemaker / defibrillator leads are still in place. Your physician has recommended that you have a Cardioversion (DCCV). Electrical Cardioversion uses a jolt of electricity to your heart either through paddles or wired patches attached to your chest. This is a controlled, usually prescheduled, procedure. Defibrillation is done under light anesthesia in the hospital, and you usually go home the day of the procedure. This is done to get your heart back into a normal rhythm. You are not awake for the procedure. Please see the instruction sheet given to you today.

## 2014-06-01 NOTE — H&P (Signed)
06/01/2014 2:00 PM  Office Visit  MRN:  [id number]   Description: Female DOB: 1945/05/08  Provider: Carlena Bjornstad, MD  Department: [facility]       Vital Signs  Most recent update: 06/01/2014 1:58 PM by Laurine Blazer, LPN    BP Pulse Ht Wt BMI SpO2    113/76 mmHg 85 5\' 7"  (1.702 m) 294 lb 8 oz (133.584 kg) 46.11 kg/m2 98%    Vitals History     Progress Notes      Carlena Bjornstad, MD at 06/01/2014 2:40 PM     Status: Sign at close encounter       Expand All Collapse All      Cardiology Office Note   Date: 06/01/2014   ID: Caitlyn Hardy, DOB 1945-09-23, MRN [id number]  PCP: [name], MD Cardiologist: Dola Argyle, MD   Chief Complaint  Patient presents with  . Appointment    Follow-up atrial fibrillation     History of Present Illness: Caitlyn Hardy is a 69 y.o. female who presents today to follow-up atrial fibrillation, coronary disease, ischemic cardiomyopathy, chronic respiratory failure with home O2, CHF. When I saw her last in the office on May 17, 2014 she had returned atrial fibrillation. She has been anticoagulated. The rate was increased. I increased her carvedilol dose. I also started to reload her with amiodarone at a higher dose. She was to take 400 mg twice a day for a week followed by 200 mg twice a day. She came back into the office for an EKG a week later and she remained in atrial fibrillation. She had been on torsemide as high as 40 mg twice a day. On February 17 her BUN was 26 and creatinine 1.6. I was concerned that her torsemide dose might be too high. This was reduced 20 mg twice a day. Labs were checked a week later in her BUN remained in the same range and her creatinine remained in the 1.6 range. Potassium was 3.9. The patient has gained some weight since she was here last. A family member who is an EMT felt that she was dehydrated and encouraged her to drink more fluid. She has had some increased fluid intake.  Her weight is increased. She does not feel well in general. Today her atrial fib rate is under better control.  Her overall cardiac history is complex. The problems are outlined carefully in this record in the problem list. She had an MI that was complicated. She had multiple other problems including a CVA. There was atrial fib which had responded to amiodarone. She has significant left ventricular dysfunction. I have been trying over time to titrate her medicines. She and I have intermittently discussed whether it is time to consider an ICD.    Past Medical History  Diagnosis Date  . CAD (coronary artery disease)     a. CABG 2001. b. PCI 2011. c. NSTEMI 12/13-03/2012 in prolonged hosp stay, felt demand isch with stable cath.  . Hypertension   . Persistent atrial fibrillation     a. Diagnosed 02/2012 - long hosp stay, difficult to control rates, LAA thrombus noted, needed emergent DCCV for VT/afib/hypotension 04/14/2012.  . Depression   . COPD (chronic obstructive pulmonary disease)   . Sleep apnea     CPAP qhs  . Diabetes mellitus   . Chronic systolic CHF (congestive heart failure)     a. EF 40% 2011, down to 25% 03/2012 felt mixed isch/nonischemic (tachy-mediated).  . Chronic kidney disease   .  H/O hiatal hernia   . Headache(784.0)   . Arthritis   . Anemia   . Stroke     a. Stroke 02/2012 with recurrent CVA in-hospital 04/6107 felt embolic.  . Thrombus of left atrial appendage     a. Possible LAA thrombus on TEE 04/01/12.  . Morbid obesity   . On home oxygen therapy   . Thrombocytopenia   . Restless leg   . CVA (cerebral infarction)     Followed by Dr. Leonie Man  . Hx of amiodarone therapy     Atrial fibrillation  . HX: anticoagulation     Atrial fibrillation  . Cardiomyopathy, ischemic   . Ejection fraction     EF 40% in the past // EF 25% January, 2014 // EF 30%, echo,  Aug 11, 2012, mid/apical anterior akinesis and anteroseptal akinesis and apical lateral akinesis and akinesis of the true apex.  . Pulmonary hypertension     PA pressure 65 mmHg, echo, Aug 11, 2012  . Sweating     May, 2014  . Dizziness     Past Surgical History  Procedure Laterality Date  . Coronary artery bypass graft  2001    x 4 vessels  . Abdominal hysterectomy    . ;    . Rhinosplasty      x 2  . Tonsillectomy    . Knee replacemnt  2000    total right  . Shoulder sx  2000    right bone spurs  . Cardiac catheterization  2013  . Tee without cardioversion  04/01/2012    Procedure: TRANSESOPHAGEAL ECHOCARDIOGRAM (TEE); Surgeon: Thayer Headings, MD; Location: [facility]; Service: Cardiovascular; Laterality: N/A;  . Left and right heart catheterization with coronary/graft angiogram N/A 03/30/2012    Procedure: LEFT AND RIGHT HEART CATHETERIZATION WITH Beatrix Fetters; Surgeon: Sherren Mocha, MD; Location: [facility] CATH LAB; Service: Cardiovascular; Laterality: N/A;    Patient Active Problem List   Diagnosis Date Noted  . Indigestion 06/17/2013  . Bradycardia 05/31/2013  . H/O amiodarone therapy 02/14/2013  . Dizziness   . Sweating   . Arthritis   . Stroke   . Thrombus of left atrial appendage   . Morbid obesity   . On home oxygen therapy   . VT (ventricular tachycardia)   . Thrombocytopenia   . Restless leg   . CAD (coronary artery disease)   . Hypertension   . Paroxysmal atrial fibrillation   . Depression   . COPD (chronic obstructive pulmonary disease)   . Sleep apnea   . Diabetes mellitus   . Chronic kidney disease   . H/O hiatal hernia   . Headache(784.0)   . Ejection fraction   . Pulmonary hypertension   . Cardiomyopathy, ischemic   . Chronic systolic heart failure 60/45/4098  .  Anemia 03/26/2012      Current Outpatient Prescriptions  Medication Sig Dispense Refill  . albuterol (PROVENTIL HFA;VENTOLIN HFA) 108 (90 BASE) MCG/ACT inhaler Inhale 1-2 puffs into the lungs every 6 (six) hours as needed for wheezing or shortness of breath.    Marland Kitchen amiodarone (PACERONE) 200 MG tablet Take 2 tablets (400 mg total) by mouth 2 (two) times daily. X's 1 week; then reduce to 200 mg twice daily 210 tablet 3  . atorvastatin (LIPITOR) 10 MG tablet Take 1 tablet (10 mg total) by mouth daily. 30 tablet 2  . carvedilol (COREG) 6.25 MG tablet Take 1 tablet (6.25 mg total) by mouth 2 (two) times daily. 180 tablet 3  .  Fe Fum-FA-B Cmp-C-Zn-Mg-Mn-Cu (HEMATINIC PLUS COMPLEX) 106-1 MG TABS Take by mouth daily.    . fluticasone (FLONASE) 50 MCG/ACT nasal spray Place 2 sprays into the nose daily.    . Fluticasone-Salmeterol (ADVAIR) 250-50 MCG/DOSE AEPB Inhale 1 puff into the lungs every 12 (twelve) hours.    Marland Kitchen HYDROcodone-acetaminophen (NORCO) 10-325 MG per tablet Take 1 tablet by mouth every 6 (six) hours as needed for pain.    Marland Kitchen insulin aspart (NOVOLOG) 100 UNIT/ML injection Sliding Scale Insulin 0-15 Units, Subcutaneous, 3 times daily with meals Correction coverage: Moderate CBG < 70: implement hypoglycemia protocol CBG 70 - 120: 0 units CBG 121 - 150: 2 units CBG 151 - 200: 3 units CBG 201 - 250: 5 units CBG 251 - 300: 8 units CBG 301 - 350: 11 units CBG 351 - 400: 15 units CBG > 400: call MD    . ipratropium-albuterol (DUONEB) 0.5-2.5 (3) MG/3ML SOLN Take 3 mLs by nebulization every 6 (six) hours as needed.    . lidocaine (LIDODERM) 5 % Place 1 patch onto the skin daily. Remove & Discard patch within 12 hours or as directed by MD 30 patch 6  . lisinopril (PRINIVIL,ZESTRIL) 20 MG tablet Take 0.5 tablets (10 mg total) by mouth daily. 45 tablet 3  . montelukast (SINGULAIR) 10 MG tablet Take 1 tablet (10 mg total) by  mouth at bedtime. 30 tablet 0  . NITROSTAT 0.4 MG SL tablet PLACE 1 TABLET UNDER TONGUE EVERY 5 MINUTES FOR 3 DOSES AS NEEDED FOR CHEST PAIN 100 tablet 0  . NON FORMULARY CPAP nightly    . NON FORMULARY Oxygen at 2L/min while out, prn other times.    Marland Kitchen omeprazole (PRILOSEC) 40 MG capsule Take 1 capsule (40 mg total) by mouth 2 (two) times daily. 180 capsule 3  . potassium chloride SA (K-DUR,KLOR-CON) 20 MEQ tablet Take 2 tablets (40 mEq total) by mouth 2 (two) times daily. 120 tablet 2  . PRADAXA 150 MG CAPS capsule TAKE 1 CAPSULE BY MOUTH EVERY 12 HOURS 60 capsule 6  . rOPINIRole (REQUIP) 2 MG tablet Take 2 mg by mouth at bedtime.    . sertraline (ZOLOFT) 50 MG tablet Take 50 mg by mouth daily.    . temazepam (RESTORIL) 15 MG capsule Take 15 mg by mouth at bedtime as needed for sleep.    Marland Kitchen tiotropium (SPIRIVA) 18 MCG inhalation capsule Place 18 mcg into inhaler and inhale daily.    Marland Kitchen torsemide (DEMADEX) 20 MG tablet Take 20 mg by mouth 2 (two) times daily. Dose decrease 05/18/14     No current facility-administered medications for this visit.    Allergies: Penicillins; Azithromycin; Bactrim; Catapres; Clonidine derivatives; and Sulfa antibiotics    Social History: The patient  reports that she quit smoking about 39 years ago. She started smoking about 50 years ago. She has never used smokeless tobacco. She reports that she does not drink alcohol or use illicit drugs.   Family History: There is no strong family history of coronary artery disease.   ROS: Please see the history of present illness. The patient does not feel well today. She is not acutely ill. She denies fever, chills, headache, sweats, rash, change in vision, change in hearing, nausea or vomiting, urinary symptoms. She feels weak in general.   PHYSICAL EXAM: VS: BP 113/76 mmHg  Pulse 85  Ht 5\' 7"  (1.702 m)  Wt 294 lb 8 oz (133.584 kg)  BMI 46.11 kg/m2   SpO2 98% , Patient  is oriented to person time and place. Affect is normal. She is here with a family member. She is wearing her oxygen. Head is atraumatic. Sclera and conjunctiva are normal. There is no jugular venous distention. Lungs are clear. Respiratory effort is nonlabored. Cardiac exam reveals S1 and S2. The abdomen is obese. There is no marked peripheral edema. However her weight is up and I suspect that her volume is up. She is in a wheelchair. There are no skin rashes. Neurologic is grossly intact. There are no musculoskeletal deformities other than her obesity.  EKG: EKG is done today and reviewed by me. The rhythm remains atrial fibrillation. The rate is controlled. The QRS complex is not changed with evidence of an old anterolateral MI.   Recent Labs: No results found for requested labs within last 365 days.    Lipid Panel  Labs (Brief)       Component Value Date/Time   CHOL 88 04/06/2012 0443   TRIG 90 04/06/2012 0443   HDL 26* 04/06/2012 0443   CHOLHDL 3.4 04/06/2012 0443   VLDL 18 04/06/2012 0443   LDLCALC 44 04/06/2012 0443       Wt Readings from Last 3 Encounters:  06/01/14 294 lb 8 oz (133.584 kg)  05/24/14 285 lb (129.275 kg)  05/17/14 288 lb (130.636 kg)      Current medicines are reviewed The patient understands her medications.     ASSESSMENT AND PLAN:              CAD (coronary artery disease) - Carlena Bjornstad, MD at 06/01/2014 3:56 PM     Status: Written Related Problem: CAD (coronary artery disease)   Expand All Collapse All   Patient underwent CABG in 2001, PCI in 2011, and a non-STEMI in January, 2014. The last cardiac admission involved a long hospital stay. The catheterization at that time revealed patent grafts and a patent stent to the circumflex. It was felt that she had had demand ischemia. The ejection fraction was 25%.            Cardiomyopathy, ischemic - Carlena Bjornstad, MD  at 06/01/2014 3:58 PM     Status: Written Related Problem: Cardiomyopathy, ischemic   Expand All Collapse All   Her last echo in 2015 revealed that her EF may have improved as high as 35-40%. The study was technically limited. It was on the basis of this slightly higher EF in her overall status that I did not begin to push for ICD placement at that time. I done the best that I can with the adjustment of her medications for her cardiomyopathy.            Chronic systolic heart failure - Carlena Bjornstad, MD at 06/01/2014 4:00 PM     Status: Written Related Problem: Chronic systolic heart failure   Expand All Collapse All   Her volume status has been more difficult to control since the return of her atrial fibrillation. She had been on torsemide as high as 40 twice a day. I had then lowered it to 20 twice a day when her creatinine was noted to be in the range of 1.6. Most recently she has had a family member who is an EMT who had suggested that she was dehydrated. She he encouraged her to drink more fluid. She increased her fluids a little bit. Her weight is up. It is very difficult to fully assess her volume status. However I suspect that she is volume overloaded.  I am hoping that she may improve in this regard once she is converted back to sinus rhythm.            COPD (chronic obstructive pulmonary disease) - Carlena Bjornstad, MD at 06/01/2014 4:00 PM     Status: Written Related Problem: COPD (chronic obstructive pulmonary disease)   Expand All Collapse All   She does have significant lung disease. She is on home O2.            Paroxysmal atrial fibrillation - Carlena Bjornstad, MD at 06/01/2014 4:05 PM     Status: Alison Stalling Related Problem: Paroxysmal atrial fibrillation   Expand All Collapse All   Her atrial fibrillation was originally diagnosed in December, 2013 during her long hospital stay. Rates were difficult to control. There was question at that time of the left atrial  thrombus. Despite this she needed urgent DC cardioversion in January, 2014 for a combination of ventricular tachycardia/atrial fibrillation/hypotension in the hospital. She had held sinus rhythm until very recently. Over time I had decreased her amiodarone dose. When her return to atrial fibrillation was noted recently I began to reload the amiodarone. She received 400 mg twice a day for a week. Since then she has been on 200 twice a day for almost 2 weeks. Today her rate is under much better control. However she remains in atrial fibrillation. She has been anticoagulated carefully continuously for a very long period of time. She says that she takes her Pradaxa very reliably.  Overall she continues to feel poorly. I feel that we should try to proceed with cardioversion soon. I have arranged this to be done at [facility] tomorrow. Labs and chest x-ray are being done this evening at [facility]. I will call and speak with Dr. Bronson Ing tonight or tomorrow morning. I feel it is appropriate to proceed with cardioversion tomorrow. If the labs suggest that we should proceed in any other fashion, that will be decided tomorrow at [facility].  As part of today's evaluation I have spent greater than 45 minutes with her total care. I've actually spent far greater than an hour. More than half of the 45 minutes has been with direct consultation with the patient and her family. We carefully talked about all of her care and all of the options available. In addition we have scheduled cardioversion. I have written the orders.       Daryel November, MD

## 2014-06-01 NOTE — Assessment & Plan Note (Signed)
She does have significant lung disease. She is on home O2.

## 2014-06-01 NOTE — Assessment & Plan Note (Addendum)
Her atrial fibrillation was originally diagnosed in December, 2013 during her long hospital stay. Rates were difficult to control. There was question at that time of the left atrial thrombus. Despite this she needed urgent DC cardioversion in January, 2014 for a combination of ventricular tachycardia/atrial fibrillation/hypotension in the hospital. She had held sinus rhythm until very recently. Over time I had decreased her amiodarone dose. When her return to atrial fibrillation was noted recently I began to reload the amiodarone. She received 400 mg twice a day for a week. Since then she has been on 200 twice a day for almost 2 weeks. Today her rate is under much better control. However she remains in atrial fibrillation. She has been anticoagulated carefully continuously for a very long period of time. She says that she takes her Pradaxa very reliably.  Overall she continues to feel poorly. I feel that we should try to proceed with cardioversion soon. I have arranged this to be done at [facility] tomorrow. Labs and chest x-ray are being done this evening at [facility]. I will call and speak with Dr. Bronson Ing tonight or tomorrow morning. I feel it is appropriate to proceed with cardioversion tomorrow. If the labs suggest that we should proceed in any other fashion, that will be decided tomorrow at [facility].  As part of today's evaluation I have spent greater than 45 minutes with her total care. I've actually spent far greater than an hour. More than half of the 45 minutes has been with direct consultation with the patient and her family. We carefully talked about all of her care and all of the options available. In addition we have scheduled cardioversion. I have written the orders.

## 2014-06-02 ENCOUNTER — Encounter: Payer: Self-pay | Admitting: *Deleted

## 2014-06-02 ENCOUNTER — Ambulatory Visit (HOSPITAL_COMMUNITY): Payer: Medicare Other | Admitting: Anesthesiology

## 2014-06-02 ENCOUNTER — Encounter (HOSPITAL_COMMUNITY): Payer: Self-pay | Admitting: *Deleted

## 2014-06-02 ENCOUNTER — Encounter (HOSPITAL_COMMUNITY)
Admission: RE | Disposition: A | Discharge status: Home / Self Care | Payer: Self-pay | Source: Ambulatory Visit | Attending: Cardiovascular Disease

## 2014-06-02 ENCOUNTER — Ambulatory Visit (HOSPITAL_COMMUNITY)
Admission: RE | Admit: 2014-06-02 | Discharge: 2014-06-02 | Disposition: A | Discharge status: Home / Self Care | Payer: Medicare Other | Source: Ambulatory Visit | Attending: Cardiovascular Disease | Admitting: Cardiovascular Disease

## 2014-06-02 ENCOUNTER — Other Ambulatory Visit: Payer: Self-pay

## 2014-06-02 DIAGNOSIS — G4730 Sleep apnea, unspecified: Secondary | ICD-10-CM | POA: Diagnosis not present

## 2014-06-02 DIAGNOSIS — Z87891 Personal history of nicotine dependence: Secondary | ICD-10-CM | POA: Insufficient documentation

## 2014-06-02 DIAGNOSIS — J9610 Chronic respiratory failure, unspecified whether with hypoxia or hypercapnia: Secondary | ICD-10-CM | POA: Diagnosis not present

## 2014-06-02 DIAGNOSIS — Z8673 Personal history of transient ischemic attack (TIA), and cerebral infarction without residual deficits: Secondary | ICD-10-CM | POA: Insufficient documentation

## 2014-06-02 DIAGNOSIS — Z7901 Long term (current) use of anticoagulants: Secondary | ICD-10-CM | POA: Diagnosis not present

## 2014-06-02 DIAGNOSIS — Z955 Presence of coronary angioplasty implant and graft: Secondary | ICD-10-CM | POA: Insufficient documentation

## 2014-06-02 DIAGNOSIS — F329 Major depressive disorder, single episode, unspecified: Secondary | ICD-10-CM | POA: Insufficient documentation

## 2014-06-02 DIAGNOSIS — Z6841 Body Mass Index (BMI) 40.0 and over, adult: Secondary | ICD-10-CM | POA: Diagnosis not present

## 2014-06-02 DIAGNOSIS — Z951 Presence of aortocoronary bypass graft: Secondary | ICD-10-CM | POA: Diagnosis not present

## 2014-06-02 DIAGNOSIS — E119 Type 2 diabetes mellitus without complications: Secondary | ICD-10-CM | POA: Diagnosis not present

## 2014-06-02 DIAGNOSIS — Z794 Long term (current) use of insulin: Secondary | ICD-10-CM | POA: Insufficient documentation

## 2014-06-02 DIAGNOSIS — I2510 Atherosclerotic heart disease of native coronary artery without angina pectoris: Secondary | ICD-10-CM | POA: Insufficient documentation

## 2014-06-02 DIAGNOSIS — N189 Chronic kidney disease, unspecified: Secondary | ICD-10-CM | POA: Insufficient documentation

## 2014-06-02 DIAGNOSIS — I5022 Chronic systolic (congestive) heart failure: Secondary | ICD-10-CM | POA: Diagnosis not present

## 2014-06-02 DIAGNOSIS — Z96651 Presence of right artificial knee joint: Secondary | ICD-10-CM | POA: Diagnosis not present

## 2014-06-02 DIAGNOSIS — J449 Chronic obstructive pulmonary disease, unspecified: Secondary | ICD-10-CM | POA: Insufficient documentation

## 2014-06-02 DIAGNOSIS — I480 Paroxysmal atrial fibrillation: Secondary | ICD-10-CM | POA: Insufficient documentation

## 2014-06-02 DIAGNOSIS — I255 Ischemic cardiomyopathy: Secondary | ICD-10-CM | POA: Insufficient documentation

## 2014-06-02 DIAGNOSIS — I129 Hypertensive chronic kidney disease with stage 1 through stage 4 chronic kidney disease, or unspecified chronic kidney disease: Secondary | ICD-10-CM | POA: Insufficient documentation

## 2014-06-02 DIAGNOSIS — Z9981 Dependence on supplemental oxygen: Secondary | ICD-10-CM | POA: Insufficient documentation

## 2014-06-02 HISTORY — DX: Unspecified asthma, uncomplicated: J45.909

## 2014-06-02 HISTORY — DX: Malignant (primary) neoplasm, unspecified: C80.1

## 2014-06-02 HISTORY — PX: CARDIOVERSION: SHX1299

## 2014-06-02 LAB — GLUCOSE, CAPILLARY
Glucose-Capillary: 111 mg/dL — ABNORMAL HIGH (ref 70–99)
Glucose-Capillary: 106 mg/dL — ABNORMAL HIGH (ref 70–99)

## 2014-06-02 SURGERY — CARDIOVERSION
Anesthesia: Monitor Anesthesia Care

## 2014-06-02 MED ORDER — FENTANYL CITRATE 0.05 MG/ML IJ SOLN: 25.0000 ug | INTRAMUSCULAR | Status: AC

## 2014-06-02 MED ORDER — ONDANSETRON HCL 4 MG/2ML IJ SOLN
INTRAMUSCULAR | Status: DC | PRN
  Administered 2014-06-02: 4 mg via INTRAVENOUS

## 2014-06-02 MED ORDER — FENTANYL CITRATE 0.05 MG/ML IJ SOLN
INTRAMUSCULAR | Status: AC
  Filled 2014-06-02: qty 2

## 2014-06-02 MED ORDER — ONDANSETRON HCL 4 MG/2ML IJ SOLN: 4.0000 mg | Freq: Once | INTRAMUSCULAR | Status: DC | PRN

## 2014-06-02 MED ORDER — LACTATED RINGERS IV SOLN
INTRAVENOUS | Status: DC
  Administered 2014-06-02: 10:00:00 via INTRAVENOUS

## 2014-06-02 MED ORDER — MIDAZOLAM HCL 2 MG/2ML IJ SOLN
INTRAMUSCULAR | Status: AC
  Filled 2014-06-02: qty 2

## 2014-06-02 MED ORDER — FENTANYL CITRATE 0.05 MG/ML IJ SOLN: 25.0000 ug | INTRAMUSCULAR | Status: DC | PRN

## 2014-06-02 MED ORDER — FUROSEMIDE 10 MG/ML IJ SOLN
INTRAMUSCULAR | Status: DC | PRN
  Administered 2014-06-02: 40 mg via INTRAMUSCULAR

## 2014-06-02 MED ORDER — SUCCINYLCHOLINE CHLORIDE 20 MG/ML IJ SOLN
INTRAMUSCULAR | Status: AC
  Filled 2014-06-02: qty 1

## 2014-06-02 MED ORDER — FUROSEMIDE 10 MG/ML IJ SOLN
INTRAMUSCULAR | Status: AC
  Filled 2014-06-02: qty 4

## 2014-06-02 MED ORDER — PROPOFOL 10 MG/ML IV BOLUS
INTRAVENOUS | Status: AC
  Filled 2014-06-02: qty 20

## 2014-06-02 MED ORDER — LACTATED RINGERS IV SOLN
INTRAVENOUS | Status: DC | PRN
  Administered 2014-06-02: 10:00:00 via INTRAVENOUS

## 2014-06-02 MED ORDER — FENTANYL CITRATE 0.05 MG/ML IJ SOLN
INTRAMUSCULAR | Status: DC | PRN
  Administered 2014-06-02 (×2): 25 ug via INTRAVENOUS

## 2014-06-02 MED ORDER — EPHEDRINE SULFATE 50 MG/ML IJ SOLN
INTRAMUSCULAR | Status: AC
  Filled 2014-06-02: qty 1

## 2014-06-02 MED ORDER — LIDOCAINE HCL (PF) 1 % IJ SOLN
INTRAMUSCULAR | Status: AC
  Filled 2014-06-02: qty 5

## 2014-06-02 MED ORDER — SODIUM CHLORIDE 0.9 % IJ SOLN
INTRAMUSCULAR | Status: AC
  Filled 2014-06-02: qty 10

## 2014-06-02 MED ORDER — ONDANSETRON HCL 4 MG/2ML IJ SOLN
4.0000 mg | Freq: Once | INTRAMUSCULAR | Status: DC
Start: 2014-06-02 — End: 2014-06-02
  Filled 2014-06-02: qty 2

## 2014-06-02 MED ORDER — MIDAZOLAM HCL 5 MG/5ML IJ SOLN
INTRAMUSCULAR | Status: DC | PRN
Start: 2014-06-02 — End: 2014-06-02
  Administered 2014-06-02 (×2): 1 mg via INTRAVENOUS

## 2014-06-02 MED ORDER — PROPOFOL INFUSION 10 MG/ML OPTIME
INTRAVENOUS | Status: DC | PRN
  Administered 2014-06-02: 125 ug/kg/min via INTRAVENOUS

## 2014-06-02 NOTE — Interval H&P Note (Signed)
History and Physical Interval Note:  I have reviewed all relevant aspects of the history and there have been no changes in history and physical. Will proceed with cardioversion.  06/02/2014 9:58 AM  Caitlyn Hardy  has presented today for surgery, with the diagnosis of parxysmal atrial fibrillation  The various methods of treatment have been discussed with the patient and family. After consideration of risks, benefits and other options for treatment, the patient has consented to  Procedure(s): CARDIOVERSION (N/A) as a surgical intervention .  The patient's history has been reviewed, patient examined, no change in status, stable for surgery.  I have reviewed the patient's chart and labs.  Questions were answered to the patient's satisfaction.     Kate Sable A

## 2014-06-02 NOTE — Progress Notes (Signed)
Patient ID: Caitlyn Hardy, female   DOB: [birth year], 69 y.o.   MRN: [id number]  Cardioversion procedure note: The patient was in rate-controlled atrial fibrillation. Anesthesia performed sedation. Initially, 150 J was attempted which failed to cardiovert her. 200 J were then successfully administered and she converted to sinus bradycardia with few PAC's and sinus arrhythmia. No complications were observed. I discussed results with patient and family members. I also administered one dose of 40 mg IV Lasix as a mild degree of pulmonary edema was seen on chest xray on 06/01/14. Dr. Ron Parker was also informed of the results of the cardioversion.

## 2014-06-02 NOTE — Telephone Encounter (Signed)
This encounter was created in error - please disregard.

## 2014-06-02 NOTE — Transfer of Care (Signed)
Immediate Anesthesia Transfer of Care Note  Patient: Caitlyn Hardy  Procedure(s) Performed: Procedure(s): CARDIOVERSION (N/A)  Patient Location: PACU  Anesthesia Type:MAC  Level of Consciousness: awake, alert , oriented and patient cooperative  Airway & Oxygen Therapy: Patient Spontanous Breathing, Patient connected to nasal cannula oxygen and Patient connected to face mask oxygen  Post-op Assessment: Report given to RN and Post -op Vital signs reviewed and stable  Post vital signs: Reviewed and stable  Last Vitals:  Filed Vitals:   06/02/14 0903  BP: 120/69  Pulse: 88  Temp: 36.9 C  Resp: 23    Complications: No apparent anesthesia complications

## 2014-06-02 NOTE — Anesthesia Postprocedure Evaluation (Signed)
  Anesthesia Post-op Note  Patient: Caitlyn Hardy  Procedure(s) Performed: Procedure(s): CARDIOVERSION (N/A)  Patient Location: PACU  Anesthesia Type:MAC  Level of Consciousness: awake, alert , oriented and patient cooperative  Airway and Oxygen Therapy: Patient Spontanous Breathing and Patient connected to face mask oxygen  Post-op Pain: none  Post-op Assessment: Post-op Vital signs reviewed and Patient's Cardiovascular Status Stable  Post-op Vital Signs: Reviewed and stable  Last Vitals:  Filed Vitals:   06/02/14 0903  BP: 120/69  Pulse: 88  Temp: 36.9 C  Resp: 23    Complications: No apparent anesthesia complications

## 2014-06-02 NOTE — Anesthesia Preprocedure Evaluation (Addendum)
Anesthesia Evaluation  Patient identified by MRN, date of birth, ID band Patient awake    Reviewed: Allergy & Precautions, NPO status , Patient's Chart, lab work & pertinent test results, reviewed documented beta blocker date and time   Airway Mallampati: IV  TM Distance: <3 FB Neck ROM: Full  Mouth opening: Limited Mouth Opening  Dental  (+) Edentulous Upper, Edentulous Lower   Pulmonary sleep apnea , COPDformer smoker,  breath sounds clear to auscultation        Cardiovascular hypertension, Pt. on medications and Pt. on home beta blockers + CAD, + CABG and +CHF Rhythm:Irregular Rate:Normal     Neuro/Psych  Headaches, PSYCHIATRIC DISORDERS Depression CVA    GI/Hepatic hiatal hernia,   Endo/Other  diabetes, Type 2Morbid obesity  Renal/GU Renal disease     Musculoskeletal  (+) Arthritis -,   Abdominal   Peds  Hematology  (+) anemia ,   Anesthesia Other Findings   Reproductive/Obstetrics                            Anesthesia Physical Anesthesia Plan  ASA: III  Anesthesia Plan: MAC   Post-op Pain Management:    Induction: Intravenous  Airway Management Planned: Simple Face Mask  Additional Equipment:   Intra-op Plan:   Post-operative Plan:   Informed Consent: I have reviewed the patients History and Physical, chart, labs and discussed the procedure including the risks, benefits and alternatives for the proposed anesthesia with the patient or authorized representative who has indicated his/her understanding and acceptance.     Plan Discussed with:   Anesthesia Plan Comments:         Anesthesia Quick Evaluation

## 2014-06-02 NOTE — Anesthesia Procedure Notes (Signed)
Procedure Name: MAC Date/Time: 06/02/2014 9:50 AM Performed by: Andree Elk, AMY A Pre-anesthesia Checklist: Patient identified, Emergency Drugs available, Suction available, Patient being monitored and Timeout performed Patient Re-evaluated:Patient Re-evaluated prior to inductionOxygen Delivery Method: Simple face mask

## 2014-06-02 NOTE — H&P (View-Only) (Signed)
06/01/2014 2:00 PM  Office Visit  MRN:  [id number]   Description: Female DOB: 1945/07/11  Provider: Carlena Bjornstad, MD  Department: [facility]       Vital Signs  Most recent update: 06/01/2014 1:58 PM by Laurine Blazer, LPN    BP Pulse Ht Wt BMI SpO2    113/76 mmHg 85 5\' 7"  (1.702 m) 294 lb 8 oz (133.584 kg) 46.11 kg/m2 98%    Vitals History     Progress Notes      Carlena Bjornstad, MD at 06/01/2014 2:40 PM     Status: Sign at close encounter       Expand All Collapse All      Cardiology Office Note   Date: 06/01/2014   ID: Caitlyn Hardy, DOB June 14, 1945, MRN [id number]  PCP: [name], MD Cardiologist: Dola Argyle, MD   Chief Complaint  Patient presents with  . Appointment    Follow-up atrial fibrillation     History of Present Illness: Caitlyn Hardy is a 69 y.o. female who presents today to follow-up atrial fibrillation, coronary disease, ischemic cardiomyopathy, chronic respiratory failure with home O2, CHF. When I saw her last in the office on May 17, 2014 she had returned atrial fibrillation. She has been anticoagulated. The rate was increased. I increased her carvedilol dose. I also started to reload her with amiodarone at a higher dose. She was to take 400 mg twice a day for a week followed by 200 mg twice a day. She came back into the office for an EKG a week later and she remained in atrial fibrillation. She had been on torsemide as high as 40 mg twice a day. On February 17 her BUN was 26 and creatinine 1.6. I was concerned that her torsemide dose might be too high. This was reduced 20 mg twice a day. Labs were checked a week later in her BUN remained in the same range and her creatinine remained in the 1.6 range. Potassium was 3.9. The patient has gained some weight since she was here last. A family member who is an EMT felt that she was dehydrated and encouraged her to drink more fluid. She has had some increased fluid intake.  Her weight is increased. She does not feel well in general. Today her atrial fib rate is under better control.  Her overall cardiac history is complex. The problems are outlined carefully in this record in the problem list. She had an MI that was complicated. She had multiple other problems including a CVA. There was atrial fib which had responded to amiodarone. She has significant left ventricular dysfunction. I have been trying over time to titrate her medicines. She and I have intermittently discussed whether it is time to consider an ICD.    Past Medical History  Diagnosis Date  . CAD (coronary artery disease)     a. CABG 2001. b. PCI 2011. c. NSTEMI 12/13-03/2012 in prolonged hosp stay, felt demand isch with stable cath.  . Hypertension   . Persistent atrial fibrillation     a. Diagnosed 02/2012 - long hosp stay, difficult to control rates, LAA thrombus noted, needed emergent DCCV for VT/afib/hypotension 04/14/2012.  . Depression   . COPD (chronic obstructive pulmonary disease)   . Sleep apnea     CPAP qhs  . Diabetes mellitus   . Chronic systolic CHF (congestive heart failure)     a. EF 40% 2011, down to 25% 03/2012 felt mixed isch/nonischemic (tachy-mediated).  . Chronic kidney disease   .  H/O hiatal hernia   . Headache(784.0)   . Arthritis   . Anemia   . Stroke     a. Stroke 02/2012 with recurrent CVA in-hospital 09/5641 felt embolic.  . Thrombus of left atrial appendage     a. Possible LAA thrombus on TEE 04/01/12.  . Morbid obesity   . On home oxygen therapy   . Thrombocytopenia   . Restless leg   . CVA (cerebral infarction)     Followed by Dr. Leonie Man  . Hx of amiodarone therapy     Atrial fibrillation  . HX: anticoagulation     Atrial fibrillation  . Cardiomyopathy, ischemic   . Ejection fraction     EF 40% in the past // EF 25% January, 2014 // EF 30%, echo,  Aug 11, 2012, mid/apical anterior akinesis and anteroseptal akinesis and apical lateral akinesis and akinesis of the true apex.  . Pulmonary hypertension     PA pressure 65 mmHg, echo, Aug 11, 2012  . Sweating     May, 2014  . Dizziness     Past Surgical History  Procedure Laterality Date  . Coronary artery bypass graft  2001    x 4 vessels  . Abdominal hysterectomy    . ;    . Rhinosplasty      x 2  . Tonsillectomy    . Knee replacemnt  2000    total right  . Shoulder sx  2000    right bone spurs  . Cardiac catheterization  2013  . Tee without cardioversion  04/01/2012    Procedure: TRANSESOPHAGEAL ECHOCARDIOGRAM (TEE); Surgeon: Thayer Headings, MD; Location: [place]; Service: Cardiovascular; Laterality: N/A;  . Left and right heart catheterization with coronary/graft angiogram N/A 03/30/2012    Procedure: LEFT AND RIGHT HEART CATHETERIZATION WITH Beatrix Fetters; Surgeon: Sherren Mocha, MD; Location: [facility] CATH LAB; Service: Cardiovascular; Laterality: N/A;    Patient Active Problem List   Diagnosis Date Noted  . Indigestion 06/17/2013  . Bradycardia 05/31/2013  . H/O amiodarone therapy 02/14/2013  . Dizziness   . Sweating   . Arthritis   . Stroke   . Thrombus of left atrial appendage   . Morbid obesity   . On home oxygen therapy   . VT (ventricular tachycardia)   . Thrombocytopenia   . Restless leg   . CAD (coronary artery disease)   . Hypertension   . Paroxysmal atrial fibrillation   . Depression   . COPD (chronic obstructive pulmonary disease)   . Sleep apnea   . Diabetes mellitus   . Chronic kidney disease   . H/O hiatal hernia   . Headache(784.0)   . Ejection fraction   . Pulmonary hypertension   . Cardiomyopathy, ischemic   . Chronic systolic heart failure 32/95/1884  .  Anemia 03/26/2012      Current Outpatient Prescriptions  Medication Sig Dispense Refill  . albuterol (PROVENTIL HFA;VENTOLIN HFA) 108 (90 BASE) MCG/ACT inhaler Inhale 1-2 puffs into the lungs every 6 (six) hours as needed for wheezing or shortness of breath.    Marland Kitchen amiodarone (PACERONE) 200 MG tablet Take 2 tablets (400 mg total) by mouth 2 (two) times daily. X's 1 week; then reduce to 200 mg twice daily 210 tablet 3  . atorvastatin (LIPITOR) 10 MG tablet Take 1 tablet (10 mg total) by mouth daily. 30 tablet 2  . carvedilol (COREG) 6.25 MG tablet Take 1 tablet (6.25 mg total) by mouth 2 (two) times daily. 180 tablet 3  .  Fe Fum-FA-B Cmp-C-Zn-Mg-Mn-Cu (HEMATINIC PLUS COMPLEX) 106-1 MG TABS Take by mouth daily.    . fluticasone (FLONASE) 50 MCG/ACT nasal spray Place 2 sprays into the nose daily.    . Fluticasone-Salmeterol (ADVAIR) 250-50 MCG/DOSE AEPB Inhale 1 puff into the lungs every 12 (twelve) hours.    Marland Kitchen HYDROcodone-acetaminophen (NORCO) 10-325 MG per tablet Take 1 tablet by mouth every 6 (six) hours as needed for pain.    Marland Kitchen insulin aspart (NOVOLOG) 100 UNIT/ML injection Sliding Scale Insulin 0-15 Units, Subcutaneous, 3 times daily with meals Correction coverage: Moderate CBG < 70: implement hypoglycemia protocol CBG 70 - 120: 0 units CBG 121 - 150: 2 units CBG 151 - 200: 3 units CBG 201 - 250: 5 units CBG 251 - 300: 8 units CBG 301 - 350: 11 units CBG 351 - 400: 15 units CBG > 400: call MD    . ipratropium-albuterol (DUONEB) 0.5-2.5 (3) MG/3ML SOLN Take 3 mLs by nebulization every 6 (six) hours as needed.    . lidocaine (LIDODERM) 5 % Place 1 patch onto the skin daily. Remove & Discard patch within 12 hours or as directed by MD 30 patch 6  . lisinopril (PRINIVIL,ZESTRIL) 20 MG tablet Take 0.5 tablets (10 mg total) by mouth daily. 45 tablet 3  . montelukast (SINGULAIR) 10 MG tablet Take 1 tablet (10 mg total) by  mouth at bedtime. 30 tablet 0  . NITROSTAT 0.4 MG SL tablet PLACE 1 TABLET UNDER TONGUE EVERY 5 MINUTES FOR 3 DOSES AS NEEDED FOR CHEST PAIN 100 tablet 0  . NON FORMULARY CPAP nightly    . NON FORMULARY Oxygen at 2L/min while out, prn other times.    Marland Kitchen omeprazole (PRILOSEC) 40 MG capsule Take 1 capsule (40 mg total) by mouth 2 (two) times daily. 180 capsule 3  . potassium chloride SA (K-DUR,KLOR-CON) 20 MEQ tablet Take 2 tablets (40 mEq total) by mouth 2 (two) times daily. 120 tablet 2  . PRADAXA 150 MG CAPS capsule TAKE 1 CAPSULE BY MOUTH EVERY 12 HOURS 60 capsule 6  . rOPINIRole (REQUIP) 2 MG tablet Take 2 mg by mouth at bedtime.    . sertraline (ZOLOFT) 50 MG tablet Take 50 mg by mouth daily.    . temazepam (RESTORIL) 15 MG capsule Take 15 mg by mouth at bedtime as needed for sleep.    Marland Kitchen tiotropium (SPIRIVA) 18 MCG inhalation capsule Place 18 mcg into inhaler and inhale daily.    Marland Kitchen torsemide (DEMADEX) 20 MG tablet Take 20 mg by mouth 2 (two) times daily. Dose decrease 05/18/14     No current facility-administered medications for this visit.    Allergies: Penicillins; Azithromycin; Bactrim; Catapres; Clonidine derivatives; and Sulfa antibiotics    Social History: The patient  reports that she quit smoking about 39 years ago. She started smoking about 50 years ago. She has never used smokeless tobacco. She reports that she does not drink alcohol or use illicit drugs.   Family History: There is no strong family history of coronary artery disease.   ROS: Please see the history of present illness. The patient does not feel well today. She is not acutely ill. She denies fever, chills, headache, sweats, rash, change in vision, change in hearing, nausea or vomiting, urinary symptoms. She feels weak in general.   PHYSICAL EXAM: VS: BP 113/76 mmHg  Pulse 85  Ht 5\' 7"  (1.702 m)  Wt 294 lb 8 oz (133.584 kg)  BMI 46.11 kg/m2   SpO2 98% , Patient  is oriented to person time and place. Affect is normal. She is here with a family member. She is wearing her oxygen. Head is atraumatic. Sclera and conjunctiva are normal. There is no jugular venous distention. Lungs are clear. Respiratory effort is nonlabored. Cardiac exam reveals S1 and S2. The abdomen is obese. There is no marked peripheral edema. However her weight is up and I suspect that her volume is up. She is in a wheelchair. There are no skin rashes. Neurologic is grossly intact. There are no musculoskeletal deformities other than her obesity.  EKG: EKG is done today and reviewed by me. The rhythm remains atrial fibrillation. The rate is controlled. The QRS complex is not changed with evidence of an old anterolateral MI.   Recent Labs: No results found for requested labs within last 365 days.    Lipid Panel  Labs (Brief)       Component Value Date/Time   CHOL 88 04/06/2012 0443   TRIG 90 04/06/2012 0443   HDL 26* 04/06/2012 0443   CHOLHDL 3.4 04/06/2012 0443   VLDL 18 04/06/2012 0443   LDLCALC 44 04/06/2012 0443       Wt Readings from Last 3 Encounters:  06/01/14 294 lb 8 oz (133.584 kg)  05/24/14 285 lb (129.275 kg)  05/17/14 288 lb (130.636 kg)      Current medicines are reviewed The patient understands her medications.     ASSESSMENT AND PLAN:              CAD (coronary artery disease) - Carlena Bjornstad, MD at 06/01/2014 3:56 PM     Status: Written Related Problem: CAD (coronary artery disease)   Expand All Collapse All   Patient underwent CABG in 2001, PCI in 2011, and a non-STEMI in January, 2014. The last cardiac admission involved a long hospital stay. The catheterization at that time revealed patent grafts and a patent stent to the circumflex. It was felt that she had had demand ischemia. The ejection fraction was 25%.            Cardiomyopathy, ischemic - Carlena Bjornstad, MD  at 06/01/2014 3:58 PM     Status: Written Related Problem: Cardiomyopathy, ischemic   Expand All Collapse All   Her last echo in 2015 revealed that her EF may have improved as high as 35-40%. The study was technically limited. It was on the basis of this slightly higher EF in her overall status that I did not begin to push for ICD placement at that time. I done the best that I can with the adjustment of her medications for her cardiomyopathy.            Chronic systolic heart failure - Carlena Bjornstad, MD at 06/01/2014 4:00 PM     Status: Written Related Problem: Chronic systolic heart failure   Expand All Collapse All   Her volume status has been more difficult to control since the return of her atrial fibrillation. She had been on torsemide as high as 40 twice a day. I had then lowered it to 20 twice a day when her creatinine was noted to be in the range of 1.6. Most recently she has had a family member who is an EMT who had suggested that she was dehydrated. She he encouraged her to drink more fluid. She increased her fluids a little bit. Her weight is up. It is very difficult to fully assess her volume status. However I suspect that she is volume overloaded.  I am hoping that she may improve in this regard once she is converted back to sinus rhythm.            COPD (chronic obstructive pulmonary disease) - Carlena Bjornstad, MD at 06/01/2014 4:00 PM     Status: Written Related Problem: COPD (chronic obstructive pulmonary disease)   Expand All Collapse All   She does have significant lung disease. She is on home O2.            Paroxysmal atrial fibrillation - Carlena Bjornstad, MD at 06/01/2014 4:05 PM     Status: Alison Stalling Related Problem: Paroxysmal atrial fibrillation   Expand All Collapse All   Her atrial fibrillation was originally diagnosed in December, 2013 during her long hospital stay. Rates were difficult to control. There was question at that time of the left atrial  thrombus. Despite this she needed urgent DC cardioversion in January, 2014 for a combination of ventricular tachycardia/atrial fibrillation/hypotension in the hospital. She had held sinus rhythm until very recently. Over time I had decreased her amiodarone dose. When her return to atrial fibrillation was noted recently I began to reload the amiodarone. She received 400 mg twice a day for a week. Since then she has been on 200 twice a day for almost 2 weeks. Today her rate is under much better control. However she remains in atrial fibrillation. She has been anticoagulated carefully continuously for a very long period of time. She says that she takes her Pradaxa very reliably.  Overall she continues to feel poorly. I feel that we should try to proceed with cardioversion soon. I have arranged this to be done at [facility] tomorrow. Labs and chest x-ray are being done this evening at [facility]. I will call and speak with Dr. Bronson Ing tonight or tomorrow morning. I feel it is appropriate to proceed with cardioversion tomorrow. If the labs suggest that we should proceed in any other fashion, that will be decided tomorrow at [facility].  As part of today's evaluation I have spent greater than 45 minutes with her total care. I've actually spent far greater than an hour. More than half of the 45 minutes has been with direct consultation with the patient and her family. We carefully talked about all of her care and all of the options available. In addition we have scheduled cardioversion. I have written the orders.       Daryel November, MD

## 2014-06-02 NOTE — OR Nursing (Signed)
fentynal 25 mcg,  zofran  4  Given to pt. By anesthesia  Hewitt Shorts CRNA

## 2014-06-02 NOTE — Discharge Instructions (Signed)

## 2014-06-05 ENCOUNTER — Telehealth: Payer: Self-pay | Admitting: Cardiology

## 2014-06-05 ENCOUNTER — Encounter (HOSPITAL_COMMUNITY): Payer: Self-pay | Admitting: Cardiovascular Disease

## 2014-06-05 NOTE — Telephone Encounter (Signed)
Pt c/o of nausea and weakness. Pt has not eaten much since cardioversion. Amiodarone was increased 3/3 400 mg BID for 1 week then reduce to 200 mg BID. Will forward to DOD and Dr. Ron Parker if Amiodarone could be causing nausea. Pt has appt with Dr. Ron Parker 3/14

## 2014-06-07 NOTE — Telephone Encounter (Signed)
[place] am [place] of [place] this entire [place]. Please call her back Thursday morning to see how she is feeling. If she is not better, TALK TO DOCTOR IN OFFICE AND DECIDE WHAT SHOULD BE DONE NEXT. Do not send an EPIC message. I am concerned about this patient.

## 2014-06-08 NOTE — Telephone Encounter (Signed)
Spoke with Dr of the day and pt on both 3/9, 3/10, and again today. Pt reported yesterday feeling better and eating more. Today reports feeling better says she has been eating and some energy has returned. Has nose congestion from allergies. DOD wanted to defer this to provider since the pt reports the symptoms have improved. Pt thanked [place] for checking on her. Will call us if anything changes.

## 2014-06-11 ENCOUNTER — Other Ambulatory Visit: Payer: Self-pay | Admitting: Cardiology

## 2014-06-12 ENCOUNTER — Encounter: Payer: Self-pay | Admitting: Cardiology

## 2014-06-12 ENCOUNTER — Ambulatory Visit (INDEPENDENT_AMBULATORY_CARE_PROVIDER_SITE_OTHER): Payer: Medicare Other | Admitting: Cardiology

## 2014-06-12 VITALS — BP 120/72 | HR 50 | Ht 67.0 in | Wt 286.0 lb

## 2014-06-12 DIAGNOSIS — R001 Bradycardia, unspecified: Secondary | ICD-10-CM

## 2014-06-12 DIAGNOSIS — Z9229 Personal history of other drug therapy: Secondary | ICD-10-CM

## 2014-06-12 DIAGNOSIS — I255 Ischemic cardiomyopathy: Secondary | ICD-10-CM

## 2014-06-12 DIAGNOSIS — I10 Essential (primary) hypertension: Secondary | ICD-10-CM

## 2014-06-12 DIAGNOSIS — Z09 Encounter for follow-up examination after completed treatment for conditions other than malignant neoplasm: Secondary | ICD-10-CM

## 2014-06-12 DIAGNOSIS — I480 Paroxysmal atrial fibrillation: Secondary | ICD-10-CM

## 2014-06-12 DIAGNOSIS — I5022 Chronic systolic (congestive) heart failure: Secondary | ICD-10-CM

## 2014-06-12 MED ORDER — CARVEDILOL 3.125 MG PO TABS: 3.1250 mg | ORAL_TABLET | Freq: Two times a day (BID) | ORAL | Status: DC

## 2014-06-12 MED ORDER — AMIODARONE HCL 200 MG PO TABS: 200.0000 mg | ORAL_TABLET | Freq: Every day | ORAL | Status: DC

## 2014-06-12 NOTE — Progress Notes (Signed)
Cardiology Office Note   Date:  06/12/2014   ID:  Caitlyn Hardy, DOB Jun 16, 1945, MRN [id number]  PCP:  [name], MD  Cardiologist:  Dola Argyle, MD   Chief Complaint  Patient presents with  . Appointment    Follow-up atrial fibrillation      History of Present Illness:  Caitlyn Hardy is a 69 y.o. female who presents today to follow up atrial fibrillation. I saw her last in the office June 01, 2014. There is a long note explaining that she had returned to atrial fibrillation. She had been reloaded on high-dose amiodarone. She continued to feel poorly. Arrange for her to be cardioverted the following day. This was done at [facility] by Dr. Bronson Ing. The procedure was successful with a second shock. Her chest x-ray had shown that she was wet. He gave her IV Lasix. Today she is here feeling somewhat better. She still has marked fatigue. She has significant bradycardia.    Past Medical History  Diagnosis Date  . CAD (coronary artery disease)     a. CABG 2001. b. PCI 2011. c. NSTEMI 12/13-03/2012 in prolonged hosp stay, felt demand isch with stable cath.  . Hypertension   . Persistent atrial fibrillation     a. Diagnosed 02/2012 - long hosp stay, difficult to control rates, LAA thrombus noted, needed emergent DCCV for VT/afib/hypotension 04/14/2012.  . Depression   . COPD (chronic obstructive pulmonary disease)   . Diabetes mellitus   . Chronic systolic CHF (congestive heart failure)     a. EF 40% 2011, down to 25% 03/2012 felt mixed isch/nonischemic (tachy-mediated).  . Chronic kidney disease   . H/O hiatal hernia   . Headache(784.0)   . Arthritis   . Anemia   . Thrombus of left atrial appendage     a. Possible LAA thrombus on TEE 04/01/12.  . Morbid obesity   . On home oxygen therapy   . Thrombocytopenia   . Restless leg   . CVA (cerebral infarction)     Followed by Dr. Leonie Man  . Hx of amiodarone therapy     Atrial fibrillation  . HX: anticoagulation    Atrial fibrillation  . Cardiomyopathy, ischemic   . Ejection fraction     EF 40% in the past   //    EF 25% January, 2014   //   EF 30%, echo, Aug 11, 2012, mid/apical anterior akinesis and anteroseptal akinesis and apical lateral akinesis and akinesis of the true apex.  . Pulmonary hypertension     PA pressure 65 mmHg, echo, Aug 11, 2012  . Sweating     May, 2014  . Dizziness   . Complication of anesthesia   . Myocardial infarction     [phone] . 2011 2014  . Stroke     a. 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  . Anginal pain   . Sleep apnea     CPAP qhs  . Shortness of breath dyspnea   . Asthma   . Pneumonia     2014  . Cancer     hysterectomy 1979 r/t/ cancer    Past Surgical History  Procedure Laterality Date  . Coronary artery bypass graft  2001    x 4 vessels  . Abdominal hysterectomy    . ;    . Rhinosplasty      x 2  . Tonsillectomy    . Knee replacemnt  2000    total right  .  Shoulder sx  2000    right bone spurs  . Cardiac catheterization  2013  . Tee without cardioversion  04/01/2012    Procedure: TRANSESOPHAGEAL ECHOCARDIOGRAM (TEE);  Surgeon: Thayer Headings, MD;  Location: [place];  Service: Cardiovascular;  Laterality: N/A;  . Left and right heart catheterization with coronary/graft angiogram N/A 03/30/2012    Procedure: LEFT AND RIGHT HEART CATHETERIZATION WITH Beatrix Fetters;  Surgeon: Sherren Mocha, MD;  Location: [facility] CATH LAB;  Service: Cardiovascular;  Laterality: N/A;  . Cardioversion N/A 06/02/2014    Procedure: CARDIOVERSION;  Surgeon: Herminio Commons, MD;  Location: [facility];  Service: Endoscopy;  Laterality: N/A;    Patient Active Problem List   Diagnosis Date Noted  . Persistent atrial fibrillation   . Acute systolic congestive heart failure   . Indigestion 06/17/2013  . Bradycardia 05/31/2013  . H/O amiodarone therapy 02/14/2013  . Dizziness   . Sweating   . Arthritis   . Stroke     . Thrombus of left atrial appendage   . Morbid obesity   . On home oxygen therapy   . VT (ventricular tachycardia)   . Thrombocytopenia   . Restless leg   . CAD (coronary artery disease)   . Hypertension   . Paroxysmal atrial fibrillation   . Depression   . COPD (chronic obstructive pulmonary disease)   . Sleep apnea   . Diabetes mellitus   . Chronic kidney disease   . H/O hiatal hernia   . Headache(784.0)   . Ejection fraction   . Pulmonary hypertension   . Cardiomyopathy, ischemic   . Chronic systolic heart failure 83/66/2947  . Anemia 03/26/2012      Current Outpatient Prescriptions  Medication Sig Dispense Refill  . albuterol (PROVENTIL HFA;VENTOLIN HFA) 108 (90 BASE) MCG/ACT inhaler Inhale 1-2 puffs into the lungs every 6 (six) hours as needed for wheezing or shortness of breath.    Marland Kitchen amiodarone (PACERONE) 200 MG tablet Take 1 tablet (200 mg total) by mouth daily. 90 tablet 3  . atorvastatin (LIPITOR) 10 MG tablet Take 1 tablet (10 mg total) by mouth daily. 30 tablet 2  . Fe Fum-FA-B Cmp-C-Zn-Mg-Mn-Cu (HEMATINIC PLUS COMPLEX) 106-1 MG TABS Take by mouth daily.    . fluticasone (FLONASE) 50 MCG/ACT nasal spray Place 2 sprays into the nose daily.    . Fluticasone-Salmeterol (ADVAIR) 250-50 MCG/DOSE AEPB Inhale 1 puff into the lungs every 12 (twelve) hours.    Marland Kitchen HYDROcodone-acetaminophen (NORCO) 10-325 MG per tablet Take 1 tablet by mouth every 6 (six) hours as needed for pain.    Marland Kitchen insulin aspart (NOVOLOG) 100 UNIT/ML injection Sliding Scale Insulin 0-15 Units, Subcutaneous, 3 times daily with meals Correction coverage: Moderate CBG < 70: implement hypoglycemia protocol CBG 70 - 120: 0 units CBG 121 - 150: 2 units CBG 151 - 200: 3 units CBG 201 - 250: 5 units CBG 251 - 300: 8 units CBG 301 - 350: 11 units CBG 351 - 400: 15 units CBG > 400: call MD    . ipratropium-albuterol (DUONEB) 0.5-2.5 (3) MG/3ML SOLN Take 3 mLs by nebulization every 6 (six) hours as needed.     . lidocaine (LIDODERM) 5 % Place 1 patch onto the skin daily. Remove & Discard patch within 12 hours or as directed by MD 30 patch 6  . lisinopril (PRINIVIL,ZESTRIL) 20 MG tablet Take 0.5 tablets (10 mg total) by mouth daily. 45 tablet 3  . montelukast (SINGULAIR) 10 MG tablet Take 1 tablet (  10 mg total) by mouth at bedtime. 30 tablet 0  . NITROSTAT 0.4 MG SL tablet PLACE 1 TABLET UNDER TONGUE EVERY 5 MINUTES FOR 3 DOSES AS NEEDED FOR CHEST PAIN 100 tablet 0  . NON FORMULARY CPAP nightly    . NON FORMULARY Oxygen at 2L/min while out, prn other times.    Marland Kitchen omeprazole (PRILOSEC) 40 MG capsule Take 1 capsule (40 mg total) by mouth 2 (two) times daily. 180 capsule 3  . potassium chloride SA (K-DUR,KLOR-CON) 20 MEQ tablet Take 2 tablets (40 mEq total) by mouth 2 (two) times daily. 120 tablet 2  . PRADAXA 150 MG CAPS capsule TAKE 1 CAPSULE BY MOUTH EVERY 12 HOURS 60 capsule 6  . rOPINIRole (REQUIP) 2 MG tablet Take 2 mg by mouth at bedtime.    . sertraline (ZOLOFT) 50 MG tablet Take 50 mg by mouth daily.    . temazepam (RESTORIL) 15 MG capsule Take 15 mg by mouth at bedtime as needed for sleep.    Marland Kitchen tiotropium (SPIRIVA) 18 MCG inhalation capsule Place 18 mcg into inhaler and inhale daily.    Marland Kitchen torsemide (DEMADEX) 20 MG tablet Take 20 mg by mouth 2 (two) times daily. Dose decrease 05/18/14    . carvedilol (COREG) 3.125 MG tablet Take 1 tablet (3.125 mg total) by mouth 2 (two) times daily. 180 tablet 3  . lisinopril (PRINIVIL,ZESTRIL) 20 MG tablet TAKE 1 TABLET BY MOUTH EVERY DAY 90 tablet 3   No current facility-administered medications for this visit.    Allergies:   Penicillins; Azithromycin; Bactrim; Catapres; Clonidine derivatives; and Sulfa antibiotics    Social History:  The patient  reports that she quit smoking about 39 years ago. Her smoking use included Cigarettes. She started smoking about 50 years ago. She has never used smokeless tobacco. She reports that she does not drink alcohol or  use illicit drugs.   Family History:  There is significant family history of coronary disease.   ROS:  Please see the history of present illness.     Patient denies fever, chills, headache, sweats, rash, change in vision, change in hearing, chest pain, cough, she still has some mild intermittent nausea. There no urinary symptoms. All other systems are reviewed and are negative.   PHYSICAL EXAM: VS:  BP 120/72 mmHg  Pulse 50  Ht 5\' 7"  (1.702 m)  Wt 286 lb (129.729 kg)  BMI 44.78 kg/m2  SpO2 96% , Patient is oriented to person time and place. She is fatigued. She is here with her family member. She is significant overweight. However she has lost weight since her cardioversion. Head is atraumatic. Sclera and conjunctiva are normal. There is no jugular venous distention. Lungs are clear. Respiratory effort is not labored. Cardiac exam reveals S1 and S2. The rhythm appears to be regular. The rate is slow. Abdomen is soft. There is no full edema. There are no musculoskeletal deformities. There are no skin rashes.  EKG:   EKG is done today and reviewed by me. There is old lateral infarct. P-wave amplitude is low but there is sinus rhythm with significant sinus bradycardia. The low P-wave amplitude is unchanged from the past before her recent atrial fib. There is no change in the QRS.   Recent Labs: No results found for requested labs within last 365 days.    Lipid Panel    Component Value Date/Time   CHOL 88 04/06/2012 0443   TRIG 90 04/06/2012 0443   HDL 26* 04/06/2012 0443  CHOLHDL 3.4 04/06/2012 0443   VLDL 18 04/06/2012 0443   LDLCALC 44 04/06/2012 0443      Wt Readings from Last 3 Encounters:  06/12/14 286 lb (129.729 kg)  06/02/14 294 lb (133.358 kg)  06/01/14 294 lb 8 oz (133.584 kg)      Current medicines are reviewed  The patient understands her medications and the changes that we are making.     ASSESSMENT AND PLAN:

## 2014-06-12 NOTE — Patient Instructions (Addendum)
Your physician recommends that you schedule a follow-up appointment in:3 weeks. Your physician has recommended you make the following change in your medication:  Decrease amiodarone to 200 mg daily. Decrease your carvedilol to 3.125 mg twice daily. You may take 1/2 of your 6.25 mg tablets twice daily until they are finished. Continue all other medications the same.

## 2014-06-12 NOTE — Assessment & Plan Note (Signed)
Her volume status is stable. It is somewhat improved since her cardioversion. I'm leaving her diuretic dose the same. Her weight is decreased. I'm hoping that her overall volume status will remain stable while she is in sinus rhythm and watching her salt and fluid intake carefully.

## 2014-06-12 NOTE — Assessment & Plan Note (Signed)
I'm trying to keep her on the maximum dose of medications possible.

## 2014-06-12 NOTE — Assessment & Plan Note (Signed)
Patient has bradycardia now that she has been converted back to sinus rhythm. I have reloaded her amiodarone and most recently she's been on 400 mg daily. Today we'll reduce the dose to 200 mg daily. Also carvedilol dose will be decreased to 3.125 mg twice a day daily.

## 2014-06-12 NOTE — Assessment & Plan Note (Signed)
Over time I had tapered her amiodarone dose down to 100 mg daily. She then reverted to atrial fibrillation. She received a full re-load with high-dose amiodarone over several weeks. She was then cardioverted to sinus rhythm. She has significant bradycardia now. Amiodarone will be reduced to 200 mg daily.

## 2014-06-12 NOTE — Assessment & Plan Note (Signed)
She reverted to atrial fibrillation and now has been cardioverted as of June 02, 2014. She is holding sinus rhythm with sinus bradycardia. I reduced her amiodarone dose down to 200 mg daily. She is anticoagulated.

## 2014-06-17 ENCOUNTER — Other Ambulatory Visit: Payer: Self-pay | Admitting: Nurse Practitioner

## 2014-07-05 ENCOUNTER — Encounter: Payer: Self-pay | Admitting: Cardiology

## 2014-07-05 ENCOUNTER — Ambulatory Visit (INDEPENDENT_AMBULATORY_CARE_PROVIDER_SITE_OTHER): Payer: Medicare Other | Admitting: Cardiology

## 2014-07-05 VITALS — BP 118/64 | HR 54 | Ht 66.0 in | Wt 274.0 lb

## 2014-07-05 DIAGNOSIS — I481 Persistent atrial fibrillation: Secondary | ICD-10-CM | POA: Diagnosis not present

## 2014-07-05 DIAGNOSIS — Z7901 Long term (current) use of anticoagulants: Secondary | ICD-10-CM

## 2014-07-05 DIAGNOSIS — I25810 Atherosclerosis of coronary artery bypass graft(s) without angina pectoris: Secondary | ICD-10-CM

## 2014-07-05 DIAGNOSIS — I255 Ischemic cardiomyopathy: Secondary | ICD-10-CM | POA: Diagnosis not present

## 2014-07-05 DIAGNOSIS — I480 Paroxysmal atrial fibrillation: Secondary | ICD-10-CM

## 2014-07-05 DIAGNOSIS — R001 Bradycardia, unspecified: Secondary | ICD-10-CM | POA: Diagnosis not present

## 2014-07-05 DIAGNOSIS — I4819 Other persistent atrial fibrillation: Secondary | ICD-10-CM

## 2014-07-05 NOTE — Assessment & Plan Note (Signed)
Patient remains on chronic anticoagulation for her paroxysmal atrial fibrillation.

## 2014-07-05 NOTE — Assessment & Plan Note (Signed)
Coronary disease is stable at this time. No change in therapy.

## 2014-07-05 NOTE — Assessment & Plan Note (Signed)
She has mild sinus bradycardia. I had to reduce her carvedilol dose at the time the last visit. She is on a higher dose of amiodarone. No change in therapy.

## 2014-07-05 NOTE — Patient Instructions (Signed)
Your physician recommends that you schedule a follow-up appointment in: 8 weeks  Your physician recommends that you continue on your current medications as directed. Please refer to the Current Medication list given to you today.   

## 2014-07-05 NOTE — Progress Notes (Signed)
Cardiology Office Note   Date:  07/05/2014   ID:  Caitlyn Hardy, DOB [birth year], MRN [id number]  PCP:  [name], MD  Cardiologist:  Dola Argyle, MD   Chief Complaint  Patient presents with  . Appointment    Follow-up atrial fibrillation      History of Present Illness: Caitlyn Hardy is a 69 y.o. female who presents today to follow-up multiple cardiac issues. Most recently she had deteriorated with atrial fibrillation. Ultimately she was cardioverted and [facility] by Dr. Bronson Ing. She went out of her weight today to say how nice and competent everyone was taking care of her on the day of her cardioversion. She did mention that when I retire she would like to be followed by Dr. Bronson Ing. She is definitely getting stronger. She has some shortness of breath with exertion but she is managing.    Past Medical History  Diagnosis Date  . CAD (coronary artery disease)     a. CABG 2001. b. PCI 2011. c. NSTEMI 12/13-03/2012 in prolonged hosp stay, felt demand isch with stable cath.  . Hypertension   . Persistent atrial fibrillation     a. Diagnosed 02/2012 - long hosp stay, difficult to control rates, LAA thrombus noted, needed emergent DCCV for VT/afib/hypotension 04/14/2012.  . Depression   . COPD (chronic obstructive pulmonary disease)   . Diabetes mellitus   . Chronic systolic CHF (congestive heart failure)     a. EF 40% 2011, down to 25% 03/2012 felt mixed isch/nonischemic (tachy-mediated).  . Chronic kidney disease   . H/O hiatal hernia   . Headache(784.0)   . Arthritis   . Anemia   . Thrombus of left atrial appendage     a. Possible LAA thrombus on TEE 04/01/12.  . Morbid obesity   . On home oxygen therapy   . Thrombocytopenia   . Restless leg   . CVA (cerebral infarction)     Followed by Dr. Leonie Man  . Hx of amiodarone therapy     Atrial fibrillation  . HX: anticoagulation     Atrial fibrillation  . Cardiomyopathy, ischemic   . Ejection fraction     EF 40% in  the past   //    EF 25% January, 2014   //   EF 30%, echo, Aug 11, 2012, mid/apical anterior akinesis and anteroseptal akinesis and apical lateral akinesis and akinesis of the true apex.  . Pulmonary hypertension     PA pressure 65 mmHg, echo, Aug 11, 2012  . Sweating     May, 2014  . Dizziness   . Complication of anesthesia   . Myocardial infarction     [phone] . 2011 2014  . Stroke     a. Stroke 02/2012 with recurrent CVA in-hospital 10/8414 felt embolic.  . Anginal pain   . Sleep apnea     CPAP qhs  . Shortness of breath dyspnea   . Asthma   . Pneumonia     2014  . Cancer     hysterectomy 1979 r/t/ cancer    Past Surgical History  Procedure Laterality Date  . Coronary artery bypass graft  2001    x 4 vessels  . Abdominal hysterectomy    . ;    . Rhinosplasty      x 2  . Tonsillectomy    . Knee replacemnt  2000    total right  . Shoulder sx  2000    right bone spurs  . Cardiac catheterization  2013  . Tee without cardioversion  04/01/2012    Procedure: TRANSESOPHAGEAL ECHOCARDIOGRAM (TEE);  Surgeon: Thayer Headings, MD;  Location: [place];  Service: Cardiovascular;  Laterality: N/A;  . Left and right heart catheterization with coronary/graft angiogram N/A 03/30/2012    Procedure: LEFT AND RIGHT HEART CATHETERIZATION WITH Beatrix Fetters;  Surgeon: Sherren Mocha, MD;  Location: [facility] CATH LAB;  Service: Cardiovascular;  Laterality: N/A;  . Cardioversion N/A 06/02/2014    Procedure: CARDIOVERSION;  Surgeon: Herminio Commons, MD;  Location: [facility];  Service: Endoscopy;  Laterality: N/A;    Patient Active Problem List   Diagnosis Date Noted  . Persistent atrial fibrillation   . Indigestion 06/17/2013  . Bradycardia 05/31/2013  . H/O amiodarone therapy 02/14/2013  . Dizziness   . Sweating   . Arthritis   . Stroke   . Thrombus of left atrial appendage   . Morbid obesity   . On home oxygen therapy   . VT (ventricular tachycardia)   .  Thrombocytopenia   . Restless leg   . CAD (coronary artery disease)   . Hypertension   . Paroxysmal atrial fibrillation   . Depression   . COPD (chronic obstructive pulmonary disease)   . Sleep apnea   . Diabetes mellitus   . Chronic kidney disease   . H/O hiatal hernia   . Headache(784.0)   . Ejection fraction   . Pulmonary hypertension   . Cardiomyopathy, ischemic   . Chronic systolic heart failure 95/63/8756  . Anemia 03/26/2012      Current Outpatient Prescriptions  Medication Sig Dispense Refill  . albuterol (PROVENTIL HFA;VENTOLIN HFA) 108 (90 BASE) MCG/ACT inhaler Inhale 1-2 puffs into the lungs every 6 (six) hours as needed for wheezing or shortness of breath.    Marland Kitchen amiodarone (PACERONE) 200 MG tablet Take 1 tablet (200 mg total) by mouth daily. 90 tablet 3  . atorvastatin (LIPITOR) 10 MG tablet Take 1 tablet (10 mg total) by mouth daily. 30 tablet 2  . carvedilol (COREG) 3.125 MG tablet Take 1 tablet (3.125 mg total) by mouth 2 (two) times daily. 180 tablet 3  . Fe Fum-FA-B Cmp-C-Zn-Mg-Mn-Cu (HEMATINIC PLUS COMPLEX) 106-1 MG TABS Take by mouth daily.    . fluticasone (FLONASE) 50 MCG/ACT nasal spray Place 2 sprays into the nose daily.    . Fluticasone-Salmeterol (ADVAIR) 250-50 MCG/DOSE AEPB Inhale 1 puff into the lungs every 12 (twelve) hours.    Marland Kitchen HYDROcodone-acetaminophen (NORCO) 10-325 MG per tablet Take 1 tablet by mouth every 6 (six) hours as needed for pain.    Marland Kitchen insulin aspart (NOVOLOG) 100 UNIT/ML injection Sliding Scale Insulin 0-15 Units, Subcutaneous, 3 times daily with meals Correction coverage: Moderate CBG < 70: implement hypoglycemia protocol CBG 70 - 120: 0 units CBG 121 - 150: 2 units CBG 151 - 200: 3 units CBG 201 - 250: 5 units CBG 251 - 300: 8 units CBG 301 - 350: 11 units CBG 351 - 400: 15 units CBG > 400: call MD    . ipratropium-albuterol (DUONEB) 0.5-2.5 (3) MG/3ML SOLN Take 3 mLs by nebulization every 6 (six) hours as needed.    . lidocaine  (LIDODERM) 5 % PLACE 1 PATCH ONTO THE SKIN EVERY DAY. REMOVE AND DISCARD PATCH WITHIN 12 HOURS OR AS DIRECTED BY MD 30 patch 0  . lisinopril (PRINIVIL,ZESTRIL) 20 MG tablet Take 0.5 tablets (10 mg total) by mouth daily. 45 tablet 3  . montelukast (SINGULAIR) 10 MG tablet Take 1 tablet (10  mg total) by mouth at bedtime. 30 tablet 0  . NITROSTAT 0.4 MG SL tablet PLACE 1 TABLET UNDER TONGUE EVERY 5 MINUTES FOR 3 DOSES AS NEEDED FOR CHEST PAIN 100 tablet 0  . NON FORMULARY CPAP nightly    . NON FORMULARY Oxygen at 2L/min while out, prn other times.    Marland Kitchen omeprazole (PRILOSEC) 40 MG capsule Take 1 capsule (40 mg total) by mouth 2 (two) times daily. 180 capsule 3  . potassium chloride SA (K-DUR,KLOR-CON) 20 MEQ tablet Take 2 tablets (40 mEq total) by mouth 2 (two) times daily. 120 tablet 2  . PRADAXA 150 MG CAPS capsule TAKE 1 CAPSULE BY MOUTH EVERY 12 HOURS 60 capsule 6  . rOPINIRole (REQUIP) 2 MG tablet Take 2 mg by mouth at bedtime.    . sertraline (ZOLOFT) 50 MG tablet Take 50 mg by mouth daily.    . temazepam (RESTORIL) 15 MG capsule Take 15 mg by mouth at bedtime as needed for sleep.    Marland Kitchen tiotropium (SPIRIVA) 18 MCG inhalation capsule Place 18 mcg into inhaler and inhale daily.    Marland Kitchen torsemide (DEMADEX) 20 MG tablet Take 20 mg by mouth 2 (two) times daily. Dose decrease 05/18/14     No current facility-administered medications for this visit.    Allergies:   Penicillins; Lyrica; Azithromycin; Bactrim; Catapres; Clonidine derivatives; and Sulfa antibiotics    Social History:  The patient  reports that she quit smoking about 39 years ago. Her smoking use included Cigarettes. She started smoking about 50 years ago. She has never used smokeless tobacco. She reports that she does not drink alcohol or use illicit drugs.   Family History:  The patient's family history includes CAD in an other family member; Heart disease in an other family member.    ROS:  Please see the history of present illness.    Patient denies fever, chills, headache, sweats, rash, change in vision, change in hearing, chest pain, cough, nausea vomiting, urinary symptoms. All other systems are reviewed and are negative.    PHYSICAL EXAM: VS:  BP 118/64 mmHg  Pulse 54  Ht 5\' 6"  (1.676 m)  Wt 274 lb (124.286 kg)  BMI 44.25 kg/m2  SpO2 94% , Patient is overweight. She is oriented to person, place. Affect is normal. She is here with her close helping friend. Her weight is down significantly from loss of volume. Head is atraumatic. Sclera and conjunctiva are normal. There is no jugular venous distention. Lungs are clear. Respiratory effort is nonlabored. She is wearing oxygen. She is in a wheelchair. Cardiac exam reveals S1 and S2. The rhythm is regular. Abdomen is soft. She has no significant edema in her legs.  EKG:   EKG is done today and reviewed by me. There is sinus rhythm. There is old lateral infarct.   Recent Labs: No results found for requested labs within last 365 days.    Lipid Panel    Component Value Date/Time   CHOL 88 04/06/2012 0443   TRIG 90 04/06/2012 0443   HDL 26* 04/06/2012 0443   CHOLHDL 3.4 04/06/2012 0443   VLDL 18 04/06/2012 0443   LDLCALC 44 04/06/2012 0443      Wt Readings from Last 3 Encounters:  07/05/14 274 lb (124.286 kg)  06/12/14 286 lb (129.729 kg)  06/02/14 294 lb (133.358 kg)      Current medicines are reviewed  Patient understands her medications.     ASSESSMENT AND PLAN:

## 2014-07-05 NOTE — Assessment & Plan Note (Signed)
Fortunately she is holding sinus rhythm. We will continue amiodarone at 200 mg daily. I'll see her back in 8 weeks.

## 2014-07-27 ENCOUNTER — Ambulatory Visit: Payer: Medicare Other | Admitting: Neurology

## 2014-07-31 ENCOUNTER — Encounter: Payer: Self-pay | Admitting: Neurology

## 2014-09-13 ENCOUNTER — Encounter: Payer: Self-pay | Admitting: Cardiology

## 2014-09-13 ENCOUNTER — Ambulatory Visit (INDEPENDENT_AMBULATORY_CARE_PROVIDER_SITE_OTHER): Payer: Medicare Other | Admitting: Cardiology

## 2014-09-13 VITALS — BP 146/79 | HR 51 | Ht 66.0 in | Wt 280.8 lb

## 2014-09-13 DIAGNOSIS — I5022 Chronic systolic (congestive) heart failure: Secondary | ICD-10-CM | POA: Diagnosis not present

## 2014-09-13 DIAGNOSIS — I255 Ischemic cardiomyopathy: Secondary | ICD-10-CM | POA: Diagnosis not present

## 2014-09-13 DIAGNOSIS — I480 Paroxysmal atrial fibrillation: Secondary | ICD-10-CM

## 2014-09-13 DIAGNOSIS — Z7901 Long term (current) use of anticoagulants: Secondary | ICD-10-CM

## 2014-09-13 DIAGNOSIS — M79605 Pain in left leg: Secondary | ICD-10-CM | POA: Insufficient documentation

## 2014-09-13 DIAGNOSIS — R001 Bradycardia, unspecified: Secondary | ICD-10-CM | POA: Diagnosis not present

## 2014-09-13 NOTE — Assessment & Plan Note (Signed)
Anticoagulation is continued.

## 2014-09-13 NOTE — Progress Notes (Signed)
Cardiology Office Note   Date:  09/13/2014   ID:  Caitlyn Hardy, DOB May 10, 1945, MRN [id number]  PCP:  [name], MD  Cardiologist:  Dola Argyle, MD   Chief Complaint  Patient presents with  . Appointment    Follow-up atrial fibrillation      History of Present Illness: Caitlyn Hardy is a 69 y.o. female who presents today to follow-up multiple cardiac issues including atrial fibrillation. She was cardioverted as an outpatient and she is holding sinus rhythm with amiodarone 200 mg daily. Her major problem at this time is intermittent swelling of her left leg with pain in the leg. She has had venous Dopplers by report that have not shown any significant abnormalities. Her total body volume status appears to be stable.    Past Medical History  Diagnosis Date  . CAD (coronary artery disease)     a. CABG 2001. b. PCI 2011. c. NSTEMI 12/13-03/2012 in prolonged hosp stay, felt demand isch with stable cath.  . Hypertension   . Persistent atrial fibrillation     a. Diagnosed 02/2012 - long hosp stay, difficult to control rates, LAA thrombus noted, needed emergent DCCV for VT/afib/hypotension 04/14/2012.  . Depression   . COPD (chronic obstructive pulmonary disease)   . Diabetes mellitus   . Chronic systolic CHF (congestive heart failure)     a. EF 40% 2011, down to 25% 03/2012 felt mixed isch/nonischemic (tachy-mediated).  . Chronic kidney disease   . H/O hiatal hernia   . Headache(784.0)   . Arthritis   . Anemia   . Thrombus of left atrial appendage     a. Possible LAA thrombus on TEE 04/01/12.  . Morbid obesity   . On home oxygen therapy   . Thrombocytopenia   . Restless leg   . CVA (cerebral infarction)     Followed by Dr. Leonie Man  . Hx of amiodarone therapy     Atrial fibrillation  . HX: anticoagulation     Atrial fibrillation  . Cardiomyopathy, ischemic   . Ejection fraction     EF 40% in the past   //    EF 25% January, 2014   //   EF 30%, echo, Aug 11, 2012,  mid/apical anterior akinesis and anteroseptal akinesis and apical lateral akinesis and akinesis of the true apex.  . Pulmonary hypertension     PA pressure 65 mmHg, echo, Aug 11, 2012  . Sweating     May, 2014  . Dizziness   . Complication of anesthesia   . Myocardial infarction     [phone] . 2011 2014  . Stroke     a. Stroke 02/2012 with recurrent CVA in-hospital 11/6281 felt embolic.  . Anginal pain   . Sleep apnea     CPAP qhs  . Shortness of breath dyspnea   . Asthma   . Pneumonia     2014  . Cancer     hysterectomy 1979 r/t/ cancer    Past Surgical History  Procedure Laterality Date  . Coronary artery bypass graft  2001    x 4 vessels  . Abdominal hysterectomy    . ;    . Rhinosplasty      x 2  . Tonsillectomy    . Knee replacemnt  2000    total right  . Shoulder sx  2000    right bone spurs  . Cardiac catheterization  2013  . Tee without cardioversion  04/01/2012    Procedure: TRANSESOPHAGEAL ECHOCARDIOGRAM (  TEE);  Surgeon: Thayer Headings, MD;  Location: [place];  Service: Cardiovascular;  Laterality: N/A;  . Left and right heart catheterization with coronary/graft angiogram N/A 03/30/2012    Procedure: LEFT AND RIGHT HEART CATHETERIZATION WITH Beatrix Fetters;  Surgeon: Sherren Mocha, MD;  Location[facility] CATH LAB;  Service: Cardiovascular;  Laterality: N/A;  . Cardioversion N/A 06/02/2014    Procedure: CARDIOVERSION;  Surgeon: Herminio Commons, MD;  Location: [facility];  Service: Endoscopy;  Laterality: N/A;    Patient Active Problem List   Diagnosis Date Noted  . Chronic anticoagulation 07/05/2014  . Indigestion 06/17/2013  . Bradycardia 05/31/2013  . H/O amiodarone therapy 02/14/2013  . Dizziness   . Sweating   . Arthritis   . Stroke   . Thrombus of left atrial appendage   . Morbid obesity   . On home oxygen therapy   . VT (ventricular tachycardia)   . Thrombocytopenia   . Restless leg   . CAD (coronary artery disease)   .  Hypertension   . Paroxysmal atrial fibrillation   . Depression   . COPD (chronic obstructive pulmonary disease)   . Sleep apnea   . Diabetes mellitus   . Chronic kidney disease   . H/O hiatal hernia   . Headache(784.0)   . Ejection fraction   . Pulmonary hypertension   . Cardiomyopathy, ischemic   . Chronic systolic heart failure 40/98/1191  . Anemia 03/26/2012      Current Outpatient Prescriptions  Medication Sig Dispense Refill  . albuterol (PROVENTIL HFA;VENTOLIN HFA) 108 (90 BASE) MCG/ACT inhaler Inhale 1-2 puffs into the lungs every 6 (six) hours as needed for wheezing or shortness of breath.    Marland Kitchen amiodarone (PACERONE) 200 MG tablet Take 1 tablet (200 mg total) by mouth daily. 90 tablet 3  . atorvastatin (LIPITOR) 10 MG tablet Take 1 tablet (10 mg total) by mouth daily. 30 tablet 2  . carvedilol (COREG) 3.125 MG tablet Take 1 tablet (3.125 mg total) by mouth 2 (two) times daily. 180 tablet 3  . Fe Fum-FA-B Cmp-C-Zn-Mg-Mn-Cu (HEMATINIC PLUS COMPLEX) 106-1 MG TABS Take by mouth daily.    . fluticasone (FLONASE) 50 MCG/ACT nasal spray Place 2 sprays into the nose daily.    . Fluticasone-Salmeterol (ADVAIR) 250-50 MCG/DOSE AEPB Inhale 1 puff into the lungs every 12 (twelve) hours.    Marland Kitchen HYDROcodone-acetaminophen (NORCO) 10-325 MG per tablet Take 1 tablet by mouth every 6 (six) hours as needed for pain.    Marland Kitchen insulin aspart (NOVOLOG) 100 UNIT/ML injection Sliding Scale Insulin 0-15 Units, Subcutaneous, 3 times daily with meals Correction coverage: Moderate CBG < 70: implement hypoglycemia protocol CBG 70 - 120: 0 units CBG 121 - 150: 2 units CBG 151 - 200: 3 units CBG 201 - 250: 5 units CBG 251 - 300: 8 units CBG 301 - 350: 11 units CBG 351 - 400: 15 units CBG > 400: call MD    . ipratropium-albuterol (DUONEB) 0.5-2.5 (3) MG/3ML SOLN Take 3 mLs by nebulization every 6 (six) hours as needed.    . lidocaine (LIDODERM) 5 % PLACE 1 PATCH ONTO THE SKIN EVERY DAY. REMOVE AND DISCARD  PATCH WITHIN 12 HOURS OR AS DIRECTED BY MD 30 patch 0  . lisinopril (PRINIVIL,ZESTRIL) 20 MG tablet Take 0.5 tablets (10 mg total) by mouth daily. 45 tablet 3  . montelukast (SINGULAIR) 10 MG tablet Take 1 tablet (10 mg total) by mouth at bedtime. 30 tablet 0  . NITROSTAT 0.4 MG SL  tablet PLACE 1 TABLET UNDER TONGUE EVERY 5 MINUTES FOR 3 DOSES AS NEEDED FOR CHEST PAIN 100 tablet 0  . NON FORMULARY CPAP nightly    . NON FORMULARY Oxygen at 2L/min while out, prn other times.    Marland Kitchen omeprazole (PRILOSEC) 40 MG capsule Take 1 capsule (40 mg total) by mouth 2 (two) times daily. 180 capsule 3  . potassium chloride SA (K-DUR,KLOR-CON) 20 MEQ tablet Take 2 tablets (40 mEq total) by mouth 2 (two) times daily. 120 tablet 2  . PRADAXA 150 MG CAPS capsule TAKE 1 CAPSULE BY MOUTH EVERY 12 HOURS 60 capsule 6  . rOPINIRole (REQUIP) 2 MG tablet Take 2 mg by mouth at bedtime.    . sertraline (ZOLOFT) 50 MG tablet Take 50 mg by mouth daily.    . temazepam (RESTORIL) 15 MG capsule Take 15 mg by mouth at bedtime as needed for sleep.    Marland Kitchen tiotropium (SPIRIVA) 18 MCG inhalation capsule Place 18 mcg into inhaler and inhale daily.    Marland Kitchen torsemide (DEMADEX) 20 MG tablet Take 20 mg by mouth 2 (two) times daily. Dose decrease 05/18/14     No current facility-administered medications for this visit.    Allergies:   Penicillins; Lyrica; Azithromycin; Bactrim; Catapres; Clonidine derivatives; and Sulfa antibiotics    Social History:  The patient  reports that she quit smoking about 39 years ago. Her smoking use included Cigarettes. She started smoking about 51 years ago. She has never used smokeless tobacco. She reports that she does not drink alcohol or use illicit drugs.   Family History:  The patient's family history includes CAD in an other family member; Heart disease in an other family member.    ROS:  Please see the history of present illness.    Patient denies fever, chills, headache, sweats, rash, change in  vision, change in hearing, chest pain, cough, nausea or vomiting, urinary symptoms. All other systems are reviewed and are negative.    PHYSICAL EXAM: VS:  BP 146/79 mmHg  Pulse 51  Ht 5\' 6"  (1.676 m)  Wt 280 lb 12.8 oz (127.37 kg)  BMI 45.34 kg/m2  SpO2 94% , Patient is oriented to person time and place. Affect is normal. Head is atraumatic. Sclera and conjunctiva are normal. There is no jugulovenous distention. Lungs are clear. Respiratory effort is not labored. Cardiac exam reveals an S1 and S2. Abdomen is soft. The patient has no significant edema in her right lower extremity. Her left lower extremity is larger without any pitting at this time. She does describe the fact that she gets significant swelling in the left lower extremity during the daytime if her leg is not elevated. There are no skin rashes. She is wearing her oxygen.  EKG:  EKG is done today and reviewed by me. She has marked sinus bradycardia. Corrected QT interval is 475 ms.    Recent Labs: No results found for requested labs within last 365 days.    Lipid Panel    Component Value Date/Time   CHOL 88 04/06/2012 0443   TRIG 90 04/06/2012 0443   HDL 26* 04/06/2012 0443   CHOLHDL 3.4 04/06/2012 0443   VLDL 18 04/06/2012 0443   LDLCALC 44 04/06/2012 0443      Wt Readings from Last 3 Encounters:  09/13/14 280 lb 12.8 oz (127.37 kg)  07/05/14 274 lb (124.286 kg)  06/12/14 286 lb (129.729 kg)      Current medicines are reviewed  The patient understands her  medications.     ASSESSMENT AND PLAN:

## 2014-09-13 NOTE — Assessment & Plan Note (Signed)
Etiology of the left leg pain and swelling is not clear to me. I am not convinced that this is related to total body volume overload. I wonder if she is having a problem with venous insufficiency. It seems unlikely that she his had a spontaneous bleed into her leg. Consideration could be given to further evaluation by vascular surgery. Also consideration could be given to proceeding with a CT scan or an MRI of the leg for further assessment. She will follow-up with her primary physician concerning these issues.

## 2014-09-13 NOTE — Assessment & Plan Note (Signed)
She is on high-dose diuretics. Her volume status is stable at this time.

## 2014-09-13 NOTE — Assessment & Plan Note (Signed)
Patient continues to have significant bradycardia. She is not having syncope or presyncope. Amiodarone is probably affecting this. She is on only a very small dose of carvedilol. I have decided to completely stop her carvedilol. She was very symptomatic from the atrial fibrillation. I am hesitant to lower her amiodarone dose at this time.

## 2014-09-13 NOTE — Patient Instructions (Signed)
Your physician recommends that you schedule a follow-up appointment in: August 17th with Dr. Ron Parker  Your physician has recommended you make the following change in your medication:   STOP Milton  Thank you for choosing [facility]!!

## 2014-09-30 ENCOUNTER — Other Ambulatory Visit: Payer: Self-pay | Admitting: Internal Medicine

## 2014-10-01 ENCOUNTER — Other Ambulatory Visit: Payer: Self-pay | Admitting: Internal Medicine

## 2014-10-09 ENCOUNTER — Other Ambulatory Visit: Payer: Self-pay | Admitting: Cardiology

## 2014-11-15 ENCOUNTER — Ambulatory Visit (INDEPENDENT_AMBULATORY_CARE_PROVIDER_SITE_OTHER): Payer: Medicare Other | Admitting: Cardiology

## 2014-11-15 ENCOUNTER — Encounter: Payer: Self-pay | Admitting: Cardiology

## 2014-11-15 VITALS — BP 140/72 | HR 60 | Ht 67.0 in | Wt 280.0 lb

## 2014-11-15 DIAGNOSIS — I25810 Atherosclerosis of coronary artery bypass graft(s) without angina pectoris: Secondary | ICD-10-CM

## 2014-11-15 DIAGNOSIS — I480 Paroxysmal atrial fibrillation: Secondary | ICD-10-CM

## 2014-11-15 DIAGNOSIS — Z9981 Dependence on supplemental oxygen: Secondary | ICD-10-CM

## 2014-11-15 DIAGNOSIS — R0989 Other specified symptoms and signs involving the circulatory and respiratory systems: Secondary | ICD-10-CM

## 2014-11-15 DIAGNOSIS — Z7901 Long term (current) use of anticoagulants: Secondary | ICD-10-CM

## 2014-11-15 DIAGNOSIS — I5022 Chronic systolic (congestive) heart failure: Secondary | ICD-10-CM

## 2014-11-15 DIAGNOSIS — I255 Ischemic cardiomyopathy: Secondary | ICD-10-CM | POA: Diagnosis not present

## 2014-11-15 DIAGNOSIS — I10 Essential (primary) hypertension: Secondary | ICD-10-CM | POA: Diagnosis not present

## 2014-11-15 DIAGNOSIS — I639 Cerebral infarction, unspecified: Secondary | ICD-10-CM | POA: Diagnosis not present

## 2014-11-15 DIAGNOSIS — R9430 Abnormal result of cardiovascular function study, unspecified: Secondary | ICD-10-CM

## 2014-11-15 DIAGNOSIS — IMO0002 Reserved for concepts with insufficient information to code with codable children: Secondary | ICD-10-CM

## 2014-11-15 DIAGNOSIS — R001 Bradycardia, unspecified: Secondary | ICD-10-CM

## 2014-11-15 DIAGNOSIS — M79605 Pain in left leg: Secondary | ICD-10-CM

## 2014-11-15 DIAGNOSIS — I472 Ventricular tachycardia, unspecified: Secondary | ICD-10-CM

## 2014-11-15 DIAGNOSIS — Z09 Encounter for follow-up examination after completed treatment for conditions other than malignant neoplasm: Secondary | ICD-10-CM

## 2014-11-15 DIAGNOSIS — Z9229 Personal history of other drug therapy: Secondary | ICD-10-CM

## 2014-11-15 NOTE — Assessment & Plan Note (Signed)
At this time her volume status is stable. She has remained stable with diuretics and the maintenance of sinus rhythm.

## 2014-11-15 NOTE — Assessment & Plan Note (Signed)
On amiodarone and off beta blockers, her heart rate is in the range of 55-60.

## 2014-11-15 NOTE — Assessment & Plan Note (Addendum)
She is chronically anticoagulated for atrial fib and history of embolic events in the past.  As part of today's evaluation I spent greater than 25 minutes with her total care. More than half of 25 minutes has been spent with direct contact reviewing all of the issues with the patient that I have outlined.

## 2014-11-15 NOTE — Patient Instructions (Addendum)
Your physician recommends that you continue on your current medications as directed. Please refer to the Current Medication list given to you today. Your physician recommends that you schedule a follow-up appointment in: 3 months with Dr. Bronson Ing. You will receive a reminder letter in the mail in about 1-2 months reminding you to call and schedule your appointment. If you don't receive this letter, please contact our office.

## 2014-11-15 NOTE — Assessment & Plan Note (Signed)
I have repeatedly considered whether the patient should be recommended for an ICD. She is aware that I have considered this. However I have felt that up to this point, her comorbidities had not been adequately stabilized.

## 2014-11-15 NOTE — Progress Notes (Signed)
Cardiology Office Note   Date:  11/15/2014   ID:  Caitlyn Hardy, DOB 1945/04/05, MRN [id number]  PCP:  [name], MD  Cardiologist:  Dola Argyle, MD   Chief Complaint  Patient presents with  . Appointment    Follow-up atrial fibrillation      History of Present Illness: Caitlyn Hardy is a 69 y.o. female who presents today to follow-up atrial fibrillation and cardiomyopathy. She looks a little better today. She is wearing her continuous oxygen. She continues to have some discomfort in her legs but this is improved. She is being assessed for the possibility that her symptoms are related to venous disease in her legs. She has had significant edema in the past, but this is not the case at this time.    Past Medical History  Diagnosis Date  . CAD (coronary artery disease)     a. CABG 2001. b. PCI 2011. c. NSTEMI 12/13-03/2012 in prolonged hosp stay, felt demand isch with stable cath.  . Hypertension   . Persistent atrial fibrillation     a. Diagnosed 02/2012 - long hosp stay, difficult to control rates, LAA thrombus noted, needed emergent DCCV for VT/afib/hypotension 04/14/2012.  . Depression   . COPD (chronic obstructive pulmonary disease)   . Diabetes mellitus   . Chronic systolic CHF (congestive heart failure)     a. EF 40% 2011, down to 25% 03/2012 felt mixed isch/nonischemic (tachy-mediated).  . Chronic kidney disease   . H/O hiatal hernia   . Headache(784.0)   . Arthritis   . Anemia   . Thrombus of left atrial appendage     a. Possible LAA thrombus on TEE 04/01/12.  . Morbid obesity   . On home oxygen therapy   . Thrombocytopenia   . Restless leg   . CVA (cerebral infarction)     Followed by Dr. Leonie Man  . Hx of amiodarone therapy     Atrial fibrillation  . HX: anticoagulation     Atrial fibrillation  . Cardiomyopathy, ischemic   . Ejection fraction     EF 40% in the past   //    EF 25% January, 2014   //   EF 30%, echo, Aug 11, 2012, mid/apical anterior  akinesis and anteroseptal akinesis and apical lateral akinesis and akinesis of the true apex.  . Pulmonary hypertension     PA pressure 65 mmHg, echo, Aug 11, 2012  . Sweating     May, 2014  . Dizziness   . Complication of anesthesia   . Myocardial infarction     [phone] . 2011 2014  . Stroke     a. Stroke 02/2012 with recurrent CVA in-hospital 11/4707 felt embolic.  . Anginal pain   . Sleep apnea     CPAP qhs  . Shortness of breath dyspnea   . Asthma   . Pneumonia     2014  . Cancer     hysterectomy 1979 r/t/ cancer    Past Surgical History  Procedure Laterality Date  . Coronary artery bypass graft  2001    x 4 vessels  . Abdominal hysterectomy    . ;    . Rhinosplasty      x 2  . Tonsillectomy    . Knee replacemnt  2000    total right  . Shoulder sx  2000    right bone spurs  . Cardiac catheterization  2013  . Tee without cardioversion  04/01/2012    Procedure: TRANSESOPHAGEAL ECHOCARDIOGRAM (  TEE);  Surgeon: Thayer Headings, MD;  Location: [place];  Service: Cardiovascular;  Laterality: N/A;  . Left and right heart catheterization with coronary/graft angiogram N/A 03/30/2012    Procedure: LEFT AND RIGHT HEART CATHETERIZATION WITH Beatrix Fetters;  Surgeon: Sherren Mocha, MD;  Location: [facility] CATH LAB;  Service: Cardiovascular;  Laterality: N/A;  . Cardioversion N/A 06/02/2014    Procedure: CARDIOVERSION;  Surgeon: Herminio Commons, MD;  Location: [facility];  Service: Endoscopy;  Laterality: N/A;    Patient Active Problem List   Diagnosis Date Noted  . Left leg pain 09/13/2014  . Chronic anticoagulation 07/05/2014  . Indigestion 06/17/2013  . Bradycardia 05/31/2013  . H/O amiodarone therapy 02/14/2013  . Dizziness   . Sweating   . Arthritis   . Stroke   . Thrombus of left atrial appendage   . Morbid obesity   . On home oxygen therapy   . VT (ventricular tachycardia)   . Thrombocytopenia   . Restless leg   . CAD (coronary artery disease)    . Hypertension   . Paroxysmal atrial fibrillation   . Depression   . COPD (chronic obstructive pulmonary disease)   . Sleep apnea   . Diabetes mellitus   . Chronic kidney disease   . H/O hiatal hernia   . Headache(784.0)   . Ejection fraction   . Pulmonary hypertension   . Cardiomyopathy, ischemic   . Chronic systolic heart failure 14/97/0263  . Anemia 03/26/2012      Current Outpatient Prescriptions  Medication Sig Dispense Refill  . albuterol (PROVENTIL HFA;VENTOLIN HFA) 108 (90 BASE) MCG/ACT inhaler Inhale 1-2 puffs into the lungs every 6 (six) hours as needed for wheezing or shortness of breath.    Marland Kitchen amiodarone (PACERONE) 200 MG tablet Take 1 tablet (200 mg total) by mouth daily. 90 tablet 3  . atorvastatin (LIPITOR) 10 MG tablet Take 1 tablet (10 mg total) by mouth daily. 30 tablet 2  . Fe Fum-FA-B Cmp-C-Zn-Mg-Mn-Cu (HEMATINIC PLUS COMPLEX) 106-1 MG TABS Take by mouth daily.    . fluticasone (FLONASE) 50 MCG/ACT nasal spray Place 2 sprays into the nose daily.    . Fluticasone-Salmeterol (ADVAIR) 250-50 MCG/DOSE AEPB Inhale 1 puff into the lungs every 12 (twelve) hours.    Marland Kitchen HYDROcodone-acetaminophen (NORCO) 10-325 MG per tablet Take 1 tablet by mouth every 6 (six) hours as needed for pain.    Marland Kitchen insulin aspart (NOVOLOG) 100 UNIT/ML injection Sliding Scale Insulin 0-15 Units, Subcutaneous, 3 times daily with meals Correction coverage: Moderate CBG < 70: implement hypoglycemia protocol CBG 70 - 120: 0 units CBG 121 - 150: 2 units CBG 151 - 200: 3 units CBG 201 - 250: 5 units CBG 251 - 300: 8 units CBG 301 - 350: 11 units CBG 351 - 400: 15 units CBG > 400: call MD    . ipratropium-albuterol (DUONEB) 0.5-2.5 (3) MG/3ML SOLN Take 3 mLs by nebulization every 6 (six) hours as needed.    . lidocaine (LIDODERM) 5 % PLACE 1 PATCH ONTO THE SKIN EVERY DAY. REMOVE AND DISCARD PATCH WITHIN 12 HOURS OR AS DIRECTED BY MD 30 patch 0  . lisinopril (PRINIVIL,ZESTRIL) 20 MG tablet Take 0.5  tablets (10 mg total) by mouth daily. 45 tablet 3  . montelukast (SINGULAIR) 10 MG tablet Take 1 tablet (10 mg total) by mouth at bedtime. 30 tablet 0  . NITROSTAT 0.4 MG SL tablet PLACE 1 TABLET UNDER TONGUE EVERY 5 MINUTES FOR 3 DOSES AS NEEDED FOR CHEST  PAIN 100 tablet 0  . NON FORMULARY CPAP nightly    . NON FORMULARY Oxygen at 2L/min while out, prn other times.    Marland Kitchen omeprazole (PRILOSEC) 40 MG capsule Take 1 capsule (40 mg total) by mouth 2 (two) times daily. 180 capsule 3  . omeprazole (PRILOSEC) 40 MG capsule TAKE 1 CAPSULE BY MOUTH TWICE A DAY 180 capsule 0  . potassium chloride SA (K-DUR,KLOR-CON) 20 MEQ tablet Take 2 tablets (40 mEq total) by mouth 2 (two) times daily. 120 tablet 2  . PRADAXA 150 MG CAPS capsule TAKE 1 CAPSULE BY MOUTH EVERY 12 HOURS 60 capsule 6  . rOPINIRole (REQUIP) 2 MG tablet Take 2 mg by mouth at bedtime.    . sertraline (ZOLOFT) 50 MG tablet Take 50 mg by mouth daily.    . temazepam (RESTORIL) 15 MG capsule Take 15 mg by mouth at bedtime as needed for sleep.    Marland Kitchen tiotropium (SPIRIVA) 18 MCG inhalation capsule Place 18 mcg into inhaler and inhale daily.    Marland Kitchen torsemide (DEMADEX) 20 MG tablet Take 20 mg by mouth 2 (two) times daily. Dose decrease 05/18/14     No current facility-administered medications for this visit.    Allergies:   Penicillins; Lyrica; Azithromycin; Bactrim; Catapres; Clonidine derivatives; and Sulfa antibiotics    Social History:  The patient  reports that she quit smoking about 39 years ago. Her smoking use included Cigarettes. She started smoking about 51 years ago. She has never used smokeless tobacco. She reports that she does not drink alcohol or use illicit drugs.   Family History:  The patient's family history includes CAD in an other family member; Heart disease in an other family member.    ROS:  Please see the history of present illness.     Patient denies fever, chills, headache, sweats, rash, change in vision, change in  hearing, chest pain, cough, nausea or vomiting, urinary symptoms. All other systems are reviewed and are negative.   PHYSICAL EXAM: VS:  BP 140/72 mmHg  Pulse 60  Ht 5\' 7"  (1.702 m)  Wt 280 lb (127.007 kg)  BMI 43.84 kg/m2  SpO2 95% , Patient is overweight but stable. She is wearing continuous oxygen. She is oriented to person time and place. Affect is normal. Head is atraumatic. Sclera and conjunctiva are normal. There is no jugular venous distention. Lungs are clear. Respiratory effort is nonlabored. She is significantly overweight. Cardiac exam reveals an S1 and S2. The abdomen is soft. There is no peripheral edema. There are no musculoskeletal deformities. She does have some swelling of her left lower extremity which does not appear to be related to edema.   EKG:   EKG is done today and reviewed by me. Her P-wave amplitude is very low. However she is definitely maintaining sinus rhythm.   Recent Labs: No results found for requested labs within last 365 days.    Lipid Panel    Component Value Date/Time   CHOL 88 04/06/2012 0443   TRIG 90 04/06/2012 0443   HDL 26* 04/06/2012 0443   CHOLHDL 3.4 04/06/2012 0443   VLDL 18 04/06/2012 0443   LDLCALC 44 04/06/2012 0443      Wt Readings from Last 3 Encounters:  11/15/14 280 lb (127.007 kg)  09/13/14 280 lb 12.8 oz (127.37 kg)  07/05/14 274 lb (124.286 kg)      Current medicines are reviewed       ASSESSMENT AND PLAN:

## 2014-11-15 NOTE — Assessment & Plan Note (Signed)
She is on continuous oxygen.

## 2014-11-15 NOTE — Assessment & Plan Note (Signed)
Amiodarone is continued. She will need follow-up labs at her next visit.

## 2014-11-15 NOTE — Assessment & Plan Note (Signed)
The patient underwent CABG in 2001 and PCI in 2011. She had elevated troponins during her hospitalization at the end of 2013 that was felt to be demand ischemia. Catheterization at that time revealed patent grafts. The stent to her circumflex was patent. However her ejection fraction was 25%. No further workup at this time.

## 2014-11-15 NOTE — Assessment & Plan Note (Signed)
In 2013 the patient was unstable in the hospital. She had CVAs that were felt to be embolic and she is anticoagulated. She does not have any significant residua from these neurologic events.

## 2014-11-15 NOTE — Assessment & Plan Note (Signed)
She has left leg pain. There is no evidence of deep venous thrombosis. She has intermittent swelling that appears not to be related to edema. This may be related to venous insufficiency and it is being assessed.

## 2014-11-15 NOTE — Assessment & Plan Note (Signed)
The patient had atrial fibrillation during her acute illness in December, 2013. She required emergent cardioversion in the hospital. She was stable as an outpatient for a period of time but then had return of atrial fibrillation with CHF in early, 2016. Amiodarone was loaded to a higher level and she underwent successful cardioversion in March, 2016. She remains in sinus rhythm. She has low P wave amplitude. Her beta blocker has been stopped completely because of ongoing bradycardia. Up to now amiodarone labs have been stable.

## 2014-11-15 NOTE — Assessment & Plan Note (Signed)
In the hospital in January, 2014 she did have some ventricular tachycardia. It was associated with an overall acute illness.

## 2014-11-15 NOTE — Assessment & Plan Note (Signed)
Her most recent ejection fraction from March, 2015 was in the 35-40% range. The study is technically difficult.

## 2015-01-16 ENCOUNTER — Other Ambulatory Visit: Payer: Self-pay | Admitting: Cardiology

## 2015-02-26 ENCOUNTER — Encounter: Payer: Self-pay | Admitting: Cardiovascular Disease

## 2015-02-26 ENCOUNTER — Ambulatory Visit (INDEPENDENT_AMBULATORY_CARE_PROVIDER_SITE_OTHER): Payer: Medicare Other | Admitting: Cardiovascular Disease

## 2015-02-26 VITALS — BP 138/88 | HR 67 | Ht 67.0 in | Wt 297.0 lb

## 2015-02-26 DIAGNOSIS — I639 Cerebral infarction, unspecified: Secondary | ICD-10-CM

## 2015-02-26 DIAGNOSIS — I209 Angina pectoris, unspecified: Secondary | ICD-10-CM

## 2015-02-26 DIAGNOSIS — I10 Essential (primary) hypertension: Secondary | ICD-10-CM

## 2015-02-26 DIAGNOSIS — I480 Paroxysmal atrial fibrillation: Secondary | ICD-10-CM

## 2015-02-26 DIAGNOSIS — I5022 Chronic systolic (congestive) heart failure: Secondary | ICD-10-CM | POA: Diagnosis not present

## 2015-02-26 DIAGNOSIS — R001 Bradycardia, unspecified: Secondary | ICD-10-CM

## 2015-02-26 DIAGNOSIS — I25708 Atherosclerosis of coronary artery bypass graft(s), unspecified, with other forms of angina pectoris: Secondary | ICD-10-CM

## 2015-02-26 DIAGNOSIS — I255 Ischemic cardiomyopathy: Secondary | ICD-10-CM

## 2015-02-26 DIAGNOSIS — Z79899 Other long term (current) drug therapy: Secondary | ICD-10-CM

## 2015-02-26 DIAGNOSIS — Z9981 Dependence on supplemental oxygen: Secondary | ICD-10-CM

## 2015-02-26 MED ORDER — ISOSORBIDE MONONITRATE ER 30 MG PO TB24: 30.0000 mg | ORAL_TABLET | Freq: Every day | ORAL | Status: DC

## 2015-02-26 NOTE — Progress Notes (Signed)
Patient ID: Caitlyn Hardy, female   DOB: 1945/07/16, 69 y.o.   MRN: [id number]      SUBJECTIVE: The patient is a 69 year old woman who is a former patient of Dr. Ron Parker whom she last saw in August 2016. She has a history of stroke, chronic systolic heart failure, ventricular tachycardia, coronary artery disease and CABG , paroxysmal atrial fibrillation (I performed cardioversion on 06/02/14), pulmonary hypertension, morbid obesity, and diabetes. Also has leg swelling deemed secondary to venous insufficiency.  She is not on beta blockers due to a history of bradycardia, but is taking amiodarone.  She has not received an ICD due to instability of multiple comorbidities as per Dr. Kae Heller note on 11/15/14.  However,  Echocardiogram on 06/30/13 demonstrated moderately reduced left ventricular systolic function, EF 123456, grade 2 diastolic dysfunction , mild mitral and tricuspid regurgitation, and moderate to severely elevated pulmonary pressures, 63 mmHg.  Coronary angiography on 03/30/12 showed patency of the saphenous vein graft to PDA, saphenous vein graft to diagonal, and LIMA to LAD. There was continued patency of the stented segment in the left circumflex coronary artery.  Has chest pain at least three times per week and takes SL nitroglycerin with relief. Uses oxygen. Not wearing compression stockings.    Review of Systems: As per "subjective", otherwise negative.  Allergies  Allergen Reactions  . Penicillins Anaphylaxis  . Lyrica [Pregabalin] Swelling  . Rocephin [Ceftriaxone Sodium In Dextrose]     Rash/hives/itching  . Azithromycin Hives and Rash  . Bactrim [Sulfamethoxazole-Trimethoprim] Hives and Rash  . Catapres [Clonidine Hcl] Itching and Rash  . Clonidine Derivatives Itching and Rash  . Sulfa Antibiotics Hives and Rash    Current Outpatient Prescriptions  Medication Sig Dispense Refill  . albuterol (PROVENTIL HFA;VENTOLIN HFA) 108 (90 BASE) MCG/ACT inhaler Inhale 1-2 puffs  into the lungs every 6 (six) hours as needed for wheezing or shortness of breath.    Marland Kitchen amiodarone (PACERONE) 200 MG tablet Take 1 tablet (200 mg total) by mouth daily. 90 tablet 3  . atorvastatin (LIPITOR) 10 MG tablet Take 1 tablet (10 mg total) by mouth daily. 30 tablet 2  . Fe Fum-FA-B Cmp-C-Zn-Mg-Mn-Cu (HEMATINIC PLUS COMPLEX) 106-1 MG TABS Take by mouth daily.    . fluticasone (FLONASE) 50 MCG/ACT nasal spray Place 2 sprays into the nose daily.    . Fluticasone-Salmeterol (ADVAIR) 250-50 MCG/DOSE AEPB Inhale 1 puff into the lungs every 12 (twelve) hours.    Marland Kitchen HYDROcodone-acetaminophen (NORCO) 10-325 MG per tablet Take 1 tablet by mouth every 6 (six) hours as needed for pain.    Marland Kitchen insulin aspart (NOVOLOG) 100 UNIT/ML injection Sliding Scale Insulin 0-15 Units, Subcutaneous, 3 times daily with meals Correction coverage: Moderate CBG < 70: implement hypoglycemia protocol CBG 70 - 120: 0 units CBG 121 - 150: 2 units CBG 151 - 200: 3 units CBG 201 - 250: 5 units CBG 251 - 300: 8 units CBG 301 - 350: 11 units CBG 351 - 400: 15 units CBG > 400: call MD    . ipratropium-albuterol (DUONEB) 0.5-2.5 (3) MG/3ML SOLN Take 3 mLs by nebulization every 6 (six) hours as needed.    Marland Kitchen levofloxacin (LEVAQUIN) 500 MG tablet Take 500 mg by mouth daily.    Marland Kitchen lidocaine (LIDODERM) 5 % PLACE 1 PATCH ONTO THE SKIN EVERY DAY. REMOVE AND DISCARD PATCH WITHIN 12 HOURS OR AS DIRECTED BY MD 30 patch 0  . lisinopril (PRINIVIL,ZESTRIL) 20 MG tablet Take 0.5 tablets (10 mg total) by mouth daily.  45 tablet 3  . montelukast (SINGULAIR) 10 MG tablet Take 1 tablet (10 mg total) by mouth at bedtime. 30 tablet 0  . NITROSTAT 0.4 MG SL tablet PLACE 1 TABLET UNDER TONGUE EVERY 5 MINUTES FOR 3 DOSES AS NEEDED FOR CHEST PAIN 100 tablet 0  . NON FORMULARY CPAP nightly    . NON FORMULARY Oxygen at 2L/min while out, prn other times.    Marland Kitchen omeprazole (PRILOSEC) 40 MG capsule Take 1 capsule (40 mg total) by mouth 2 (two) times daily.  180 capsule 3  . omeprazole (PRILOSEC) 40 MG capsule TAKE 1 CAPSULE BY MOUTH TWICE DAILY 180 capsule 3  . potassium chloride SA (K-DUR,KLOR-CON) 20 MEQ tablet Take 2 tablets (40 mEq total) by mouth 2 (two) times daily. 120 tablet 2  . PRADAXA 150 MG CAPS capsule TAKE 1 CAPSULE BY MOUTH EVERY 12 HOURS 60 capsule 6  . predniSONE (DELTASONE) 20 MG tablet Take 20 mg by mouth daily with breakfast.    . rOPINIRole (REQUIP) 2 MG tablet Take 2 mg by mouth at bedtime.    . sertraline (ZOLOFT) 50 MG tablet Take 50 mg by mouth daily.    . temazepam (RESTORIL) 15 MG capsule Take 15 mg by mouth at bedtime as needed for sleep.    Marland Kitchen tiotropium (SPIRIVA) 18 MCG inhalation capsule Place 18 mcg into inhaler and inhale daily.    Marland Kitchen torsemide (DEMADEX) 20 MG tablet Take 20 mg by mouth 2 (two) times daily. Dose decrease 05/18/14     No current facility-administered medications for this visit.    Past Medical History  Diagnosis Date  . CAD (coronary artery disease)     a. CABG 2001. b. PCI 2011. c. NSTEMI 12/13-03/2012 in prolonged hosp stay, felt demand isch with stable cath.  . Hypertension   . Persistent atrial fibrillation ([place])     a. Diagnosed 02/2012 - long hosp stay, difficult to control rates, LAA thrombus noted, needed emergent DCCV for VT/afib/hypotension 04/14/2012.  . Depression   . COPD (chronic obstructive pulmonary disease) ([place])   . Diabetes mellitus ([place])   . Chronic systolic CHF (congestive heart failure) ([place])     a. EF 40% 2011, down to 25% 03/2012 felt mixed isch/nonischemic (tachy-mediated).  . Chronic kidney disease   . H/O hiatal hernia   . Headache(784.0)   . Arthritis   . Anemia   . Thrombus of left atrial appendage     a. Possible LAA thrombus on TEE 04/01/12.  . Morbid obesity ([place])   . On home oxygen therapy   . Thrombocytopenia ([place])   . Restless leg   . CVA (cerebral infarction)     Followed by Dr. Leonie Man  . Hx of amiodarone therapy     Atrial fibrillation  . HX:  anticoagulation     Atrial fibrillation  . Cardiomyopathy, ischemic   . Ejection fraction     EF 40% in the past   //    EF 25% January, 2014   //   EF 30%, echo, Aug 11, 2012, mid/apical anterior akinesis and anteroseptal akinesis and apical lateral akinesis and akinesis of the true apex.  . Pulmonary hypertension ([place])     PA pressure 65 mmHg, echo, Aug 11, 2012  . Sweating     May, 2014  . Dizziness   . Complication of anesthesia   . Myocardial infarction [facility])     [phone] . 2011 2014  . Stroke [facility])     a. Stroke  02/2012 with recurrent CVA in-hospital 123456 felt embolic.  . Anginal pain ([place])   . Sleep apnea     CPAP qhs  . Shortness of breath dyspnea   . Asthma   . Pneumonia     2014  . Cancer [facility])     hysterectomy 1979 r/t/ cancer    Past Surgical History  Procedure Laterality Date  . Coronary artery bypass graft  2001    x 4 vessels  . Abdominal hysterectomy    . ;    . Rhinosplasty      x 2  . Tonsillectomy    . Knee replacemnt  2000    total right  . Shoulder sx  2000    right bone spurs  . Cardiac catheterization  2013  . Tee without cardioversion  04/01/2012    Procedure: TRANSESOPHAGEAL ECHOCARDIOGRAM (TEE);  Surgeon: Thayer Headings, MD;  Location: [place];  Service: Cardiovascular;  Laterality: N/A;  . Left and right heart catheterization with coronary/graft angiogram N/A 03/30/2012    Procedure: LEFT AND RIGHT HEART CATHETERIZATION WITH Beatrix Fetters;  Surgeon: Sherren Mocha, MD;  Location: [facility] CATH LAB;  Service: Cardiovascular;  Laterality: N/A;  . Cardioversion N/A 06/02/2014    Procedure: CARDIOVERSION;  Surgeon: Herminio Commons, MD;  Location: [facility];  Service: Endoscopy;  Laterality: N/A;    Social History   Social History  . Marital Status: Single    Spouse Name: N/A  . Number of Children: 4  . Years of Education: GED   Occupational History  . Not on file.   Social History Main Topics  . Smoking status:  Former Smoker    Types: Cigarettes    Start date: 08/15/1963    Quit date: 04/01/1975  . Smokeless tobacco: Never Used  . Alcohol Use: No  . Drug Use: No  . Sexual Activity: Not on file   Other Topics Concern  . Not on file   Social History Narrative   Patient is single with 4 children   Patient has a GED   Patient is right handed   Patient drinks 2 cups daily     Filed Vitals:   02/26/15 1502  BP: 138/88  Pulse: 67  Height: 5\' 7"  (1.702 m)  Weight: 297 lb (134.718 kg)  SpO2: 98%    PHYSICAL EXAM General: NAD, using oxygen by nasal cannula. HEENT: Normal. Neck: No JVD, no thyromegaly. Lungs: Diminished b/l, no rales. CV: Regular rate and rhythm, normal S1/S2, no S3/S4, no murmur. Trace pretibial and periankle edema b/l.  No carotid bruit.  Abdomen: Obese.  Neurologic: Alert and oriented x 3.  Psych: Normal affect. Skin: Normal. Musculoskeletal: No gross deformities. Extremities: No clubbing or cyanosis.   ECG: Most recent ECG reviewed.      ASSESSMENT AND PLAN: 1. Chest pain in context of CAD with CABG (2001): Given frequent chest pain, will add Imdur 30 mg. Patent grafts by last cath as noted above. Continue Lipitor and lisinopril. Not on ASA, presumably due to Pradaxa use.  2. Chronic systolic heart failure: Euvolemic on torsemide 20 mg bid. No changes.  3. CVA: Anticoagulated with Pradaxa. Not on ASA.  4. Paroxysmal atrial fibrillation: Remains on amiodarone and Pradaxa. No changes.  5. Bradycardia: HR 67 bpm.  6. Essential HTN: Upper normal. No changes.  Dispo: f/u 6 months.  Kate Sable, M.D., F.A.C.C.

## 2015-02-26 NOTE — Patient Instructions (Addendum)
   Begin Imdur 30mg  daily - new sent to [place] all other medications.   Your physician wants you to follow up in: 6 months.  You will receive a reminder letter in the mail one-two months in advance.  If you don't receive a letter, please call our office to schedule the follow up appointment

## 2015-03-29 ENCOUNTER — Telehealth: Payer: Self-pay | Admitting: Allergy & Immunology

## 2015-03-29 NOTE — Telephone Encounter (Signed)
I was called to admit Caitlyn Hardy for CHF and/or COPD exacerbation: Patient came with cc of dyspnea, wheezing, orthopnea, LE edema. She improved after albuterol neb. CXR suggestive of acute HF. CBC showed Hb of 6.7. Patient denied chest pain or GI bleeding. Vitals are stable.   Will admit to tele bed, transfuse 2 units PRBC on admission, continue mgt of COPD exacerbation and CHF exacerbation.

## 2015-03-30 ENCOUNTER — Inpatient Hospital Stay (HOSPITAL_COMMUNITY)
Admission: AD | Admit: 2015-03-30 | Discharge: 2015-04-10 | DRG: 291 | Disposition: A | Discharge status: Home Health | Payer: Medicare Other | Source: Other Acute Inpatient Hospital | Attending: Internal Medicine | Admitting: Internal Medicine

## 2015-03-30 ENCOUNTER — Inpatient Hospital Stay (HOSPITAL_COMMUNITY): Payer: Medicare Other

## 2015-03-30 ENCOUNTER — Other Ambulatory Visit: Payer: Self-pay

## 2015-03-30 ENCOUNTER — Encounter (HOSPITAL_COMMUNITY): Payer: Medicare Other

## 2015-03-30 DIAGNOSIS — E1143 Type 2 diabetes mellitus with diabetic autonomic (poly)neuropathy: Secondary | ICD-10-CM | POA: Diagnosis present

## 2015-03-30 DIAGNOSIS — J209 Acute bronchitis, unspecified: Secondary | ICD-10-CM | POA: Diagnosis present

## 2015-03-30 DIAGNOSIS — K219 Gastro-esophageal reflux disease without esophagitis: Secondary | ICD-10-CM | POA: Diagnosis present

## 2015-03-30 DIAGNOSIS — G4730 Sleep apnea, unspecified: Secondary | ICD-10-CM | POA: Diagnosis not present

## 2015-03-30 DIAGNOSIS — N183 Chronic kidney disease, stage 3 unspecified: Secondary | ICD-10-CM | POA: Diagnosis present

## 2015-03-30 DIAGNOSIS — I513 Intracardiac thrombosis, not elsewhere classified: Secondary | ICD-10-CM | POA: Diagnosis present

## 2015-03-30 DIAGNOSIS — D509 Iron deficiency anemia, unspecified: Secondary | ICD-10-CM | POA: Diagnosis not present

## 2015-03-30 DIAGNOSIS — F329 Major depressive disorder, single episode, unspecified: Secondary | ICD-10-CM | POA: Diagnosis present

## 2015-03-30 DIAGNOSIS — I480 Paroxysmal atrial fibrillation: Secondary | ICD-10-CM | POA: Diagnosis present

## 2015-03-30 DIAGNOSIS — J9621 Acute and chronic respiratory failure with hypoxia: Secondary | ICD-10-CM | POA: Diagnosis present

## 2015-03-30 DIAGNOSIS — I252 Old myocardial infarction: Secondary | ICD-10-CM | POA: Diagnosis not present

## 2015-03-30 DIAGNOSIS — Z7901 Long term (current) use of anticoagulants: Secondary | ICD-10-CM

## 2015-03-30 DIAGNOSIS — N184 Chronic kidney disease, stage 4 (severe): Secondary | ICD-10-CM | POA: Diagnosis present

## 2015-03-30 DIAGNOSIS — Z7951 Long term (current) use of inhaled steroids: Secondary | ICD-10-CM | POA: Diagnosis not present

## 2015-03-30 DIAGNOSIS — M7989 Other specified soft tissue disorders: Secondary | ICD-10-CM

## 2015-03-30 DIAGNOSIS — I639 Cerebral infarction, unspecified: Secondary | ICD-10-CM | POA: Diagnosis present

## 2015-03-30 DIAGNOSIS — Z9861 Coronary angioplasty status: Secondary | ICD-10-CM

## 2015-03-30 DIAGNOSIS — I5023 Acute on chronic systolic (congestive) heart failure: Secondary | ICD-10-CM | POA: Diagnosis present

## 2015-03-30 DIAGNOSIS — Z8542 Personal history of malignant neoplasm of other parts of uterus: Secondary | ICD-10-CM

## 2015-03-30 DIAGNOSIS — E119 Type 2 diabetes mellitus without complications: Secondary | ICD-10-CM

## 2015-03-30 DIAGNOSIS — K449 Diaphragmatic hernia without obstruction or gangrene: Secondary | ICD-10-CM | POA: Diagnosis present

## 2015-03-30 DIAGNOSIS — J9620 Acute and chronic respiratory failure, unspecified whether with hypoxia or hypercapnia: Secondary | ICD-10-CM | POA: Diagnosis present

## 2015-03-30 DIAGNOSIS — Z87891 Personal history of nicotine dependence: Secondary | ICD-10-CM

## 2015-03-30 DIAGNOSIS — I272 Other secondary pulmonary hypertension: Secondary | ICD-10-CM | POA: Diagnosis present

## 2015-03-30 DIAGNOSIS — I255 Ischemic cardiomyopathy: Secondary | ICD-10-CM | POA: Diagnosis present

## 2015-03-30 DIAGNOSIS — I25119 Atherosclerotic heart disease of native coronary artery with unspecified angina pectoris: Secondary | ICD-10-CM | POA: Diagnosis present

## 2015-03-30 DIAGNOSIS — K3184 Gastroparesis: Secondary | ICD-10-CM | POA: Diagnosis present

## 2015-03-30 DIAGNOSIS — Z951 Presence of aortocoronary bypass graft: Secondary | ICD-10-CM

## 2015-03-30 DIAGNOSIS — E1122 Type 2 diabetes mellitus with diabetic chronic kidney disease: Secondary | ICD-10-CM | POA: Diagnosis present

## 2015-03-30 DIAGNOSIS — J45909 Unspecified asthma, uncomplicated: Secondary | ICD-10-CM | POA: Diagnosis present

## 2015-03-30 DIAGNOSIS — E785 Hyperlipidemia, unspecified: Secondary | ICD-10-CM | POA: Diagnosis present

## 2015-03-30 DIAGNOSIS — Z96651 Presence of right artificial knee joint: Secondary | ICD-10-CM | POA: Diagnosis present

## 2015-03-30 DIAGNOSIS — K2970 Gastritis, unspecified, without bleeding: Secondary | ICD-10-CM | POA: Diagnosis present

## 2015-03-30 DIAGNOSIS — G2581 Restless legs syndrome: Secondary | ICD-10-CM | POA: Diagnosis present

## 2015-03-30 DIAGNOSIS — E875 Hyperkalemia: Secondary | ICD-10-CM | POA: Diagnosis present

## 2015-03-30 DIAGNOSIS — Z79899 Other long term (current) drug therapy: Secondary | ICD-10-CM

## 2015-03-30 DIAGNOSIS — I130 Hypertensive heart and chronic kidney disease with heart failure and stage 1 through stage 4 chronic kidney disease, or unspecified chronic kidney disease: Principal | ICD-10-CM | POA: Diagnosis present

## 2015-03-30 DIAGNOSIS — Z6841 Body Mass Index (BMI) 40.0 and over, adult: Secondary | ICD-10-CM | POA: Diagnosis not present

## 2015-03-30 DIAGNOSIS — I25810 Atherosclerosis of coronary artery bypass graft(s) without angina pectoris: Secondary | ICD-10-CM | POA: Diagnosis not present

## 2015-03-30 DIAGNOSIS — I10 Essential (primary) hypertension: Secondary | ICD-10-CM | POA: Diagnosis present

## 2015-03-30 DIAGNOSIS — Z9981 Dependence on supplemental oxygen: Secondary | ICD-10-CM

## 2015-03-30 DIAGNOSIS — J441 Chronic obstructive pulmonary disease with (acute) exacerbation: Secondary | ICD-10-CM | POA: Diagnosis not present

## 2015-03-30 DIAGNOSIS — Z794 Long term (current) use of insulin: Secondary | ICD-10-CM

## 2015-03-30 DIAGNOSIS — I509 Heart failure, unspecified: Secondary | ICD-10-CM

## 2015-03-30 DIAGNOSIS — D62 Acute posthemorrhagic anemia: Secondary | ICD-10-CM | POA: Diagnosis present

## 2015-03-30 DIAGNOSIS — Z7952 Long term (current) use of systemic steroids: Secondary | ICD-10-CM

## 2015-03-30 DIAGNOSIS — J440 Chronic obstructive pulmonary disease with acute lower respiratory infection: Secondary | ICD-10-CM | POA: Diagnosis present

## 2015-03-30 DIAGNOSIS — G4733 Obstructive sleep apnea (adult) (pediatric): Secondary | ICD-10-CM | POA: Diagnosis present

## 2015-03-30 DIAGNOSIS — R0602 Shortness of breath: Secondary | ICD-10-CM | POA: Diagnosis present

## 2015-03-30 DIAGNOSIS — Z8673 Personal history of transient ischemic attack (TIA), and cerebral infarction without residual deficits: Secondary | ICD-10-CM | POA: Diagnosis not present

## 2015-03-30 DIAGNOSIS — Z23 Encounter for immunization: Secondary | ICD-10-CM | POA: Diagnosis not present

## 2015-03-30 DIAGNOSIS — I2510 Atherosclerotic heart disease of native coronary artery without angina pectoris: Secondary | ICD-10-CM | POA: Diagnosis present

## 2015-03-30 DIAGNOSIS — F32A Depression, unspecified: Secondary | ICD-10-CM | POA: Diagnosis present

## 2015-03-30 LAB — CBC
HCT: 23.1 % — ABNORMAL LOW (ref 36.0–46.0)
HCT: 23.6 % — ABNORMAL LOW (ref 36.0–46.0)
HCT: 26.2 % — ABNORMAL LOW (ref 36.0–46.0)
HEMOGLOBIN: 6.5 g/dL — AB (ref 12.0–15.0)
HEMOGLOBIN: 6.9 g/dL — AB (ref 12.0–15.0)
HEMOGLOBIN: 7.4 g/dL — AB (ref 12.0–15.0)
MCH: 22.3 pg — ABNORMAL LOW (ref 26.0–34.0)
MCH: 23.2 pg — ABNORMAL LOW (ref 26.0–34.0)
MCH: 22.4 pg — ABNORMAL LOW (ref 26.0–34.0)
MCHC: 28.1 g/dL — AB (ref 30.0–36.0)
MCHC: 29.2 g/dL — ABNORMAL LOW (ref 30.0–36.0)
MCHC: 28.2 g/dL — ABNORMAL LOW (ref 30.0–36.0)
MCV: 79.1 fL (ref 78.0–100.0)
MCV: 79.5 fL (ref 78.0–100.0)
MCV: 79.4 fL (ref 78.0–100.0)
PLATELETS: 193 10*3/uL (ref 150–400)
Platelets: 199 10*3/uL (ref 150–400)
Platelets: 230 10*3/uL (ref 150–400)
RBC: 2.92 MIL/uL — ABNORMAL LOW (ref 3.87–5.11)
RBC: 2.97 MIL/uL — AB (ref 3.87–5.11)
RBC: 3.3 MIL/uL — ABNORMAL LOW (ref 3.87–5.11)
RDW: 17.3 % — AB (ref 11.5–15.5)
RDW: 17 % — ABNORMAL HIGH (ref 11.5–15.5)
RDW: 16.9 % — AB (ref 11.5–15.5)
WBC: 5.7 10*3/uL (ref 4.0–10.5)
WBC: 7.9 10*3/uL (ref 4.0–10.5)
WBC: 9.3 10*3/uL (ref 4.0–10.5)

## 2015-03-30 LAB — LIPID PANEL
CHOL/HDL RATIO: 3.5 ratio
Cholesterol: 160 mg/dL (ref 0–200)
HDL: 46 mg/dL (ref >40)
LDL CALC: 103 mg/dL — AB (ref 0–99)
Triglycerides: 54 mg/dL (ref <150)
VLDL: 11 mg/dL (ref 0–40)

## 2015-03-30 LAB — GLUCOSE, CAPILLARY
GLUCOSE-CAPILLARY: 203 mg/dL — AB (ref 65–99)
GLUCOSE-CAPILLARY: 184 mg/dL — AB (ref 65–99)
GLUCOSE-CAPILLARY: 168 mg/dL — AB (ref 65–99)
GLUCOSE-CAPILLARY: 141 mg/dL — AB (ref 65–99)
GLUCOSE-CAPILLARY: 157 mg/dL — AB (ref 65–99)

## 2015-03-30 LAB — PROTIME-INR
INR: 1.63 — ABNORMAL HIGH (ref 0.00–1.49)
Prothrombin Time: 19.4 seconds — ABNORMAL HIGH (ref 11.6–15.2)

## 2015-03-30 LAB — TROPONIN I: Troponin I: 0.03 ng/mL (ref <0.031)

## 2015-03-30 LAB — MRSA PCR SCREENING: MRSA BY PCR: POSITIVE — AB

## 2015-03-30 LAB — STREP PNEUMONIAE URINARY ANTIGEN: Strep Pneumo Urinary Antigen: NEGATIVE

## 2015-03-30 LAB — ABO/RH: ABO/RH(D): O POS

## 2015-03-30 LAB — PREPARE RBC (CROSSMATCH)

## 2015-03-30 LAB — APTT: APTT: 50 s — AB (ref 24–37)

## 2015-03-30 MED ORDER — HYDROCODONE-ACETAMINOPHEN 10-325 MG PO TABS
1.0000 | ORAL_TABLET | Freq: Four times a day (QID) | ORAL | Status: DC | PRN
  Administered 2015-03-30 – 2015-04-10 (×30): 1 via ORAL
  Filled 2015-03-30 (×30): qty 1

## 2015-03-30 MED ORDER — FE FUMARATE-B12-VIT C-FA-IFC PO CAPS
1.0000 | ORAL_CAPSULE | Freq: Every day | ORAL | Status: DC
  Administered 2015-03-30 – 2015-04-10 (×12): 1 via ORAL
  Filled 2015-03-30 (×12): qty 1

## 2015-03-30 MED ORDER — SODIUM CHLORIDE 0.9 % IV SOLN
Freq: Once | INTRAVENOUS | Status: AC
  Administered 2015-03-30: 14:00:00 via INTRAVENOUS

## 2015-03-30 MED ORDER — HEMATINIC PLUS COMPLEX 106-1 MG PO TABS: 1.0000 | ORAL_TABLET | Freq: Every day | ORAL | Status: DC

## 2015-03-30 MED ORDER — FLUTICASONE PROPIONATE 50 MCG/ACT NA SUSP
2.0000 | Freq: Every day | NASAL | Status: DC
  Administered 2015-03-30 – 2015-04-10 (×12): 2 via NASAL
  Filled 2015-03-30: qty 16

## 2015-03-30 MED ORDER — DM-GUAIFENESIN ER 30-600 MG PO TB12
1.0000 | ORAL_TABLET | Freq: Two times a day (BID) | ORAL | Status: DC
  Administered 2015-03-30 – 2015-04-10 (×23): 1 via ORAL
  Filled 2015-03-30 (×25): qty 1

## 2015-03-30 MED ORDER — SODIUM CHLORIDE 0.9 % IJ SOLN
3.0000 mL | Freq: Two times a day (BID) | INTRAMUSCULAR | Status: DC
  Administered 2015-03-30 – 2015-04-09 (×20): 3 mL via INTRAVENOUS

## 2015-03-30 MED ORDER — ONDANSETRON HCL 4 MG/2ML IJ SOLN: 4.0000 mg | Freq: Four times a day (QID) | INTRAMUSCULAR | Status: DC | PRN

## 2015-03-30 MED ORDER — IPRATROPIUM-ALBUTEROL 0.5-2.5 (3) MG/3ML IN SOLN
3.0000 mL | RESPIRATORY_TRACT | Status: DC
  Administered 2015-03-30 – 2015-03-31 (×6): 3 mL via RESPIRATORY_TRACT
  Filled 2015-03-30 (×5): qty 3

## 2015-03-30 MED ORDER — SODIUM CHLORIDE 0.9 % IV SOLN: 250.0000 mL | INTRAVENOUS | Status: DC | PRN

## 2015-03-30 MED ORDER — SODIUM CHLORIDE 0.9 % IJ SOLN: 3.0000 mL | INTRAMUSCULAR | Status: DC | PRN

## 2015-03-30 MED ORDER — PANTOPRAZOLE SODIUM 40 MG PO TBEC: 40.0000 mg | DELAYED_RELEASE_TABLET | Freq: Every day | ORAL | Status: DC

## 2015-03-30 MED ORDER — PANTOPRAZOLE SODIUM 40 MG PO TBEC
40.0000 mg | DELAYED_RELEASE_TABLET | Freq: Every day | ORAL | Status: DC
  Administered 2015-03-30: 40 mg via ORAL
  Filled 2015-03-30: qty 1

## 2015-03-30 MED ORDER — ALBUTEROL SULFATE (2.5 MG/3ML) 0.083% IN NEBU: 2.5000 mg | INHALATION_SOLUTION | RESPIRATORY_TRACT | Status: DC | PRN

## 2015-03-30 MED ORDER — HEPARIN SODIUM (PORCINE) 5000 UNIT/ML IJ SOLN: 5000.0000 [IU] | Freq: Three times a day (TID) | INTRAMUSCULAR | Status: DC

## 2015-03-30 MED ORDER — METHYLPREDNISOLONE SODIUM SUCC 125 MG IJ SOLR
60.0000 mg | Freq: Two times a day (BID) | INTRAMUSCULAR | Status: DC
Start: 2015-03-30 — End: 2015-04-01
  Administered 2015-03-30 – 2015-04-01 (×5): 60 mg via INTRAVENOUS
  Filled 2015-03-30 (×5): qty 2

## 2015-03-30 MED ORDER — INSULIN ASPART 100 UNIT/ML ~~LOC~~ SOLN: 0.0000 [IU] | Freq: Every day | SUBCUTANEOUS | Status: DC

## 2015-03-30 MED ORDER — FUROSEMIDE 10 MG/ML IJ SOLN
60.0000 mg | Freq: Two times a day (BID) | INTRAMUSCULAR | Status: DC
  Administered 2015-03-30 – 2015-04-03 (×9): 60 mg via INTRAVENOUS
  Filled 2015-03-30 (×9): qty 6

## 2015-03-30 MED ORDER — ADULT MULTIVITAMIN W/MINERALS CH
1.0000 | ORAL_TABLET | Freq: Every day | ORAL | Status: DC
  Administered 2015-03-30 – 2015-04-10 (×12): 1 via ORAL
  Filled 2015-03-30 (×12): qty 1

## 2015-03-30 MED ORDER — ISOSORBIDE MONONITRATE ER 30 MG PO TB24
30.0000 mg | ORAL_TABLET | Freq: Every day | ORAL | Status: DC
  Administered 2015-03-30 – 2015-04-10 (×12): 30 mg via ORAL
  Filled 2015-03-30 (×14): qty 1

## 2015-03-30 MED ORDER — PANTOPRAZOLE SODIUM 40 MG IV SOLR
40.0000 mg | Freq: Two times a day (BID) | INTRAVENOUS | Status: DC
Start: 2015-03-30 — End: 2015-04-04
  Administered 2015-03-30 – 2015-04-04 (×11): 40 mg via INTRAVENOUS
  Filled 2015-03-30 (×11): qty 40

## 2015-03-30 MED ORDER — SERTRALINE HCL 50 MG PO TABS
50.0000 mg | ORAL_TABLET | Freq: Every day | ORAL | Status: DC
  Administered 2015-03-30 – 2015-04-10 (×12): 50 mg via ORAL
  Filled 2015-03-30 (×12): qty 1

## 2015-03-30 MED ORDER — LIDOCAINE 5 % EX PTCH
1.0000 | MEDICATED_PATCH | CUTANEOUS | Status: DC
  Administered 2015-03-30 – 2015-04-10 (×12): 1 via TRANSDERMAL
  Filled 2015-03-30 (×12): qty 1

## 2015-03-30 MED ORDER — MONTELUKAST SODIUM 10 MG PO TABS
10.0000 mg | ORAL_TABLET | Freq: Every day | ORAL | Status: DC
  Administered 2015-03-30 – 2015-04-09 (×11): 10 mg via ORAL
  Filled 2015-03-30 (×13): qty 1

## 2015-03-30 MED ORDER — ACETAMINOPHEN 325 MG PO TABS: 650.0000 mg | ORAL_TABLET | ORAL | Status: DC | PRN

## 2015-03-30 MED ORDER — TEMAZEPAM 15 MG PO CAPS
15.0000 mg | ORAL_CAPSULE | Freq: Every evening | ORAL | Status: DC | PRN
  Administered 2015-03-31 – 2015-04-09 (×11): 15 mg via ORAL
  Filled 2015-03-30 (×11): qty 1

## 2015-03-30 MED ORDER — DOXYCYCLINE HYCLATE 100 MG PO TABS
100.0000 mg | ORAL_TABLET | Freq: Two times a day (BID) | ORAL | Status: DC
  Administered 2015-03-30 – 2015-04-06 (×15): 100 mg via ORAL
  Filled 2015-03-30 (×15): qty 1

## 2015-03-30 MED ORDER — AMIODARONE HCL 200 MG PO TABS
200.0000 mg | ORAL_TABLET | Freq: Every day | ORAL | Status: DC
  Administered 2015-03-30 – 2015-04-10 (×12): 200 mg via ORAL
  Filled 2015-03-30 (×13): qty 1

## 2015-03-30 MED ORDER — MUPIROCIN 2 % EX OINT
1.0000 "application " | TOPICAL_OINTMENT | Freq: Two times a day (BID) | CUTANEOUS | Status: AC
  Administered 2015-03-30 – 2015-04-03 (×10): 1 via NASAL
  Filled 2015-03-30 (×3): qty 22

## 2015-03-30 MED ORDER — LISINOPRIL 10 MG PO TABS
10.0000 mg | ORAL_TABLET | Freq: Every day | ORAL | Status: DC
  Administered 2015-03-30 – 2015-03-31 (×2): 10 mg via ORAL
  Filled 2015-03-30 (×2): qty 1

## 2015-03-30 MED ORDER — CHLORHEXIDINE GLUCONATE CLOTH 2 % EX PADS
6.0000 | MEDICATED_PAD | Freq: Every day | CUTANEOUS | Status: AC
  Administered 2015-03-30 – 2015-04-03 (×4): 6 via TOPICAL

## 2015-03-30 MED ORDER — TIOTROPIUM BROMIDE MONOHYDRATE 18 MCG IN CAPS
18.0000 ug | ORAL_CAPSULE | Freq: Every day | RESPIRATORY_TRACT | Status: DC
  Administered 2015-03-31 – 2015-04-10 (×9): 18 ug via RESPIRATORY_TRACT
  Filled 2015-03-30 (×2): qty 5

## 2015-03-30 MED ORDER — NITROGLYCERIN 0.4 MG SL SUBL
0.4000 mg | SUBLINGUAL_TABLET | SUBLINGUAL | Status: DC | PRN
  Administered 2015-04-08: 0.4 mg via SUBLINGUAL
  Filled 2015-03-30: qty 1

## 2015-03-30 MED ORDER — INSULIN ASPART 100 UNIT/ML ~~LOC~~ SOLN
0.0000 [IU] | Freq: Three times a day (TID) | SUBCUTANEOUS | Status: DC
  Administered 2015-03-30: 2 [IU] via SUBCUTANEOUS
  Administered 2015-03-31: 3 [IU] via SUBCUTANEOUS
  Administered 2015-03-31: 2 [IU] via SUBCUTANEOUS
  Administered 2015-03-31 – 2015-04-01 (×2): 3 [IU] via SUBCUTANEOUS
  Administered 2015-04-01 – 2015-04-02 (×3): 2 [IU] via SUBCUTANEOUS
  Administered 2015-04-03: 5 [IU] via SUBCUTANEOUS
  Administered 2015-04-03: 3 [IU] via SUBCUTANEOUS
  Administered 2015-04-04: 2 [IU] via SUBCUTANEOUS
  Administered 2015-04-04: 8 [IU] via SUBCUTANEOUS
  Administered 2015-04-05: 3 [IU] via SUBCUTANEOUS
  Administered 2015-04-05 – 2015-04-06 (×2): 2 [IU] via SUBCUTANEOUS
  Administered 2015-04-07: 5 [IU] via SUBCUTANEOUS
  Administered 2015-04-07: 2 [IU] via SUBCUTANEOUS
  Administered 2015-04-08 – 2015-04-09 (×2): 3 [IU] via SUBCUTANEOUS
  Administered 2015-04-09: 5 [IU] via SUBCUTANEOUS

## 2015-03-30 MED ORDER — ATORVASTATIN CALCIUM 10 MG PO TABS
10.0000 mg | ORAL_TABLET | Freq: Every day | ORAL | Status: DC
  Administered 2015-03-30 – 2015-04-10 (×12): 10 mg via ORAL
  Filled 2015-03-30 (×12): qty 1

## 2015-03-30 MED ORDER — PNEUMOCOCCAL VAC POLYVALENT 25 MCG/0.5ML IJ INJ
0.5000 mL | INJECTION | INTRAMUSCULAR | Status: AC
  Administered 2015-03-31: 0.5 mL via INTRAMUSCULAR
  Filled 2015-03-30: qty 0.5

## 2015-03-30 NOTE — Progress Notes (Signed)
Utilization review completed. Meila Berke, RN, BSN. 

## 2015-03-30 NOTE — Consult Note (Signed)
[facility] Consultation Note  Referring Provider: Dr. Louellen Molder [facility]) Primary Care Physician:  [name], MD Primary Gastroenterologist:  [name] [place], GI MD  Reason for Consultation:  anemia  HPI: Caitlyn Hardy is a 69 y.o. female admitted for shortness of breath.  She has multiple medical problems outlined below, including COPD, CKD, CAD, atrial fibrillation complicated by stroke x 3, on chronic anticoagulation (Pradaxa).  Patient found to have anemia with microcytosis.  Occasional intermittent hematochezia for years.  No other hematochezia, melena, hematemesis.  No nausea, vomiting, dysphagia.  No abdominal pain.  No unintentional weight loss.  Has had anemia intermittently for years, had EGD and COLONOSCOPY in [place] several years ago and, more recently, in 2015 (years ago for anemia, more recently for abdominal pain), gastric polyps and hemorrhoids reportedly found (don't have those reports).   Past Medical History  Diagnosis Date  . CAD (coronary artery disease)     a. CABG 2001. b. PCI 2011. c. NSTEMI 12/13-03/2012 in prolonged hosp stay, felt demand isch with stable cath.  . Hypertension   . Persistent atrial fibrillation ([place])     a. Diagnosed 02/2012 - long hosp stay, difficult to control rates, LAA thrombus noted, needed emergent DCCV for VT/afib/hypotension 04/14/2012.  . Depression   . COPD (chronic obstructive pulmonary disease) ([place])   . Diabetes mellitus ([place])   . Chronic systolic CHF (congestive heart failure) ([place])     a. EF 40% 2011, down to 25% 03/2012 felt mixed isch/nonischemic (tachy-mediated).  . Chronic kidney disease   . H/O hiatal hernia   . Headache(784.0)   . Arthritis   . Anemia   . Thrombus of left atrial appendage     a. Possible LAA thrombus on TEE 04/01/12.  . Morbid obesity ([place])   . On home oxygen therapy   . Thrombocytopenia ([place])   . Restless leg   . CVA (cerebral infarction)     Followed by Dr. Leonie Man  . Hx of amiodarone  therapy     Atrial fibrillation  . HX: anticoagulation     Atrial fibrillation  . Cardiomyopathy, ischemic   . Ejection fraction     EF 40% in the past   //    EF 25% January, 2014   //   EF 30%, echo, Aug 11, 2012, mid/apical anterior akinesis and anteroseptal akinesis and apical lateral akinesis and akinesis of the true apex.  . Pulmonary hypertension ([place])     PA pressure 65 mmHg, echo, Aug 11, 2012  . Sweating     May, 2014  . Dizziness   . Complication of anesthesia   . Myocardial infarction [facility])     [phone] . 2011 2014  . Stroke [facility])     a. Stroke 02/2012 with recurrent CVA in-hospital [id number] felt embolic.  . Anginal pain ([place])   . Sleep apnea     CPAP qhs  . Shortness of breath dyspnea   . Asthma   . Pneumonia     2014  . Cancer [facility])     hysterectomy 1979 r/t/ cancer    Past Surgical History  Procedure Laterality Date  . Coronary artery bypass graft  2001    x 4 vessels  . Abdominal hysterectomy    . ;    . Rhinosplasty      x 2  . Tonsillectomy    . Knee replacemnt  2000    total right  . Shoulder sx  2000    right bone spurs  .  Cardiac catheterization  2013  . Tee without cardioversion  04/01/2012    Procedure: TRANSESOPHAGEAL ECHOCARDIOGRAM (TEE);  Surgeon: Thayer Headings, MD;  Location: [place];  Service: Cardiovascular;  Laterality: N/A;  . Left and right heart catheterization with coronary/graft angiogram N/A 03/30/2012    Procedure: LEFT AND RIGHT HEART CATHETERIZATION WITH Beatrix Fetters;  Surgeon: Sherren Mocha, MD;  Location: [facility] CATH LAB;  Service: Cardiovascular;  Laterality: N/A;  . Cardioversion N/A 06/02/2014    Procedure: CARDIOVERSION;  Surgeon: Herminio Commons, MD;  Location: [facility];  Service: Endoscopy;  Laterality: N/A;    Prior to Admission medications   Medication Sig Start Date End Date Taking? Authorizing Provider  albuterol (PROVENTIL HFA;VENTOLIN HFA) 108 (90 BASE) MCG/ACT inhaler Inhale 1-2 puffs into  the lungs every 6 (six) hours as needed for wheezing or shortness of breath.   Yes Historical Provider, MD  amiodarone (PACERONE) 200 MG tablet Take 1 tablet (200 mg total) by mouth daily. 06/12/14  Yes Carlena Bjornstad, MD  atorvastatin (LIPITOR) 10 MG tablet Take 1 tablet (10 mg total) by mouth daily. 04/19/12  Yes Dayna N Dunn, PA-C  diphenhydrAMINE (SOMINEX) 25 MG tablet Take 25 mg by mouth at bedtime as needed for allergies or sleep.   Yes Historical Provider, MD  Fe Fum-FA-B Cmp-C-Zn-Mg-Mn-Cu (HEMATINIC PLUS COMPLEX) 106-1 MG TABS Take by mouth daily.   Yes Historical Provider, MD  fluticasone (FLONASE) 50 MCG/ACT nasal spray Place 2 sprays into the nose daily.   Yes Historical Provider, MD  Fluticasone-Salmeterol (ADVAIR) 250-50 MCG/DOSE AEPB Inhale 1 puff into the lungs every 12 (twelve) hours.   Yes Historical Provider, MD  HYDROcodone-acetaminophen (NORCO) 10-325 MG per tablet Take 1 tablet by mouth every 6 (six) hours as needed for pain.   Yes Historical Provider, MD  ipratropium-albuterol (DUONEB) 0.5-2.5 (3) MG/3ML SOLN Take 3 mLs by nebulization every 6 (six) hours as needed (wheezing).    Yes Historical Provider, MD  isosorbide mononitrate (IMDUR) 30 MG 24 hr tablet Take 1 tablet (30 mg total) by mouth daily. 02/26/15  Yes Herminio Commons, MD  levofloxacin (LEVAQUIN) 500 MG tablet Take 500 mg by mouth daily.   Yes Historical Provider, MD  lidocaine (LIDODERM) 5 % PLACE 1 PATCH ONTO THE SKIN EVERY DAY. REMOVE AND DISCARD PATCH WITHIN 12 HOURS OR AS DIRECTED BY MD 06/18/14  Yes Garvin Fila, MD  lisinopril (PRINIVIL,ZESTRIL) 20 MG tablet Take 0.5 tablets (10 mg total) by mouth daily.   Yes Carlena Bjornstad, MD  montelukast (SINGULAIR) 10 MG tablet Take 1 tablet (10 mg total) by mouth at bedtime. 04/19/12  Yes Dayna N Dunn, PA-C  NITROSTAT 0.4 MG SL tablet PLACE 1 TABLET UNDER TONGUE EVERY 5 MINUTES FOR 3 DOSES AS NEEDED FOR CHEST PAIN 02/16/14  Yes Carlena Bjornstad, MD  NON FORMULARY CPAP  nightly   Yes Historical Provider, MD  NON FORMULARY Oxygen at 2L/min while out, prn other times.   Yes Historical Provider, MD  NOVOLOG FLEXPEN 100 UNIT/ML FlexPen Inject 0-15 Units into the skin 3 (three) times daily. 0-15 units per sliding scale up to three times daily 03/12/15  Yes Historical Provider, MD  omeprazole (PRILOSEC) 40 MG capsule Take 1 capsule (40 mg total) by mouth 2 (two) times daily. 06/17/13  Yes Carlena Bjornstad, MD  potassium chloride SA (K-DUR,KLOR-CON) 20 MEQ tablet Take 2 tablets (40 mEq total) by mouth 2 (two) times daily. 04/19/12  Yes Charlie Pitter, PA-C  PRADAXA  150 MG CAPS capsule TAKE 1 CAPSULE BY MOUTH EVERY 12 HOURS 10/03/14  Yes Carlena Bjornstad, MD  predniSONE (DELTASONE) 20 MG tablet Take 20 mg by mouth daily with breakfast.   Yes Historical Provider, MD  rOPINIRole (REQUIP) 2 MG tablet Take 2 mg by mouth at bedtime.   Yes Historical Provider, MD  sertraline (ZOLOFT) 50 MG tablet Take 50 mg by mouth daily.   Yes Historical Provider, MD  temazepam (RESTORIL) 15 MG capsule Take 15 mg by mouth at bedtime as needed for sleep.   Yes Historical Provider, MD  tiotropium (SPIRIVA) 18 MCG inhalation capsule Place 18 mcg into inhaler and inhale daily.   Yes Historical Provider, MD  torsemide (DEMADEX) 20 MG tablet Take 20 mg by mouth 2 (two) times daily. Dose decrease 05/18/14   Yes Historical Provider, MD    Current Facility-Administered Medications  Medication Dose Route Frequency Provider Last Rate Last Dose  . 0.9 %  sodium chloride infusion   Intravenous Once Ivor Costa, MD      . 0.9 %  sodium chloride infusion  250 mL Intravenous PRN Ivor Costa, MD      . acetaminophen (TYLENOL) tablet 650 mg  650 mg Oral Q4H PRN Ivor Costa, MD      . albuterol (PROVENTIL) (2.5 MG/3ML) 0.083% nebulizer solution 2.5 mg  2.5 mg Nebulization Q2H PRN Ivor Costa, MD      . amiodarone (PACERONE) tablet 200 mg  200 mg Oral Daily Ivor Costa, MD   200 mg at 03/30/15 1120  . atorvastatin (LIPITOR)  tablet 10 mg  10 mg Oral Daily Ivor Costa, MD   10 mg at 03/30/15 1120  . Chlorhexidine Gluconate Cloth 2 % PADS 6 each  6 each Topical Q0600 Ivor Costa, MD   6 each at 03/30/15 [phone]  . dextromethorphan-guaiFENesin (MUCINEX DM) 30-600 MG per 12 hr tablet 1 tablet  1 tablet Oral BID Ivor Costa, MD   1 tablet at 03/30/15 0441  . doxycycline (VIBRA-TABS) tablet 100 mg  100 mg Oral Q12H Ivor Costa, MD   100 mg at 03/30/15 1120  . ferrous Q000111Q C-folic acid (TRINSICON / FOLTRIN) capsule 1 capsule  1 capsule Oral Daily Ivor Costa, MD   1 capsule at 03/30/15 1120  . fluticasone (FLONASE) 50 MCG/ACT nasal spray 2 spray  2 spray Each Nare Daily Ivor Costa, MD      . furosemide (LASIX) injection 60 mg  60 mg Intravenous BID Ivor Costa, MD   60 mg at 03/30/15 0430  . HYDROcodone-acetaminophen (NORCO) 10-325 MG per tablet 1 tablet  1 tablet Oral Q6H PRN Ivor Costa, MD   1 tablet at 03/30/15 0441  . ipratropium-albuterol (DUONEB) 0.5-2.5 (3) MG/3ML nebulizer solution 3 mL  3 mL Nebulization Q4H Ivor Costa, MD   3 mL at 03/30/15 1157  . isosorbide mononitrate (IMDUR) 24 hr tablet 30 mg  30 mg Oral Daily Ivor Costa, MD   30 mg at 03/30/15 1121  . lidocaine (LIDODERM) 5 % 1 patch  1 patch Transdermal Q24H Ivor Costa, MD   1 patch at 03/30/15 1119  . lisinopril (PRINIVIL,ZESTRIL) tablet 10 mg  10 mg Oral Daily Ivor Costa, MD   10 mg at 03/30/15 1121  . methylPREDNISolone sodium succinate (SOLU-MEDROL) 125 mg/2 mL injection 60 mg  60 mg Intravenous Q12H Ivor Costa, MD   60 mg at 03/30/15 0441  . montelukast (SINGULAIR) tablet 10 mg  10 mg Oral QHS Ivor Costa, MD      .  multivitamin with minerals tablet 1 tablet  1 tablet Oral Daily Ivor Costa, MD   1 tablet at 03/30/15 1122  . mupirocin ointment (BACTROBAN) 2 % 1 application  1 application Nasal BID Ivor Costa, MD   1 application at [id number]  . nitroGLYCERIN (NITROSTAT) SL tablet 0.4 mg  0.4 mg Sublingual Q5 min PRN Ivor Costa, MD      . ondansetron [facility])  injection 4 mg  4 mg Intravenous Q6H PRN Ivor Costa, MD      . pantoprazole (PROTONIX) EC tablet 40 mg  40 mg Oral Daily Ivor Costa, MD   40 mg at 03/30/15 1122  . [START ON 03/31/2015] pneumococcal 23 valent vaccine (PNU-IMMUNE) injection 0.5 mL  0.5 mL Intramuscular Tomorrow-1000 Nishant Dhungel, MD      . sertraline (ZOLOFT) tablet 50 mg  50 mg Oral Daily Ivor Costa, MD   50 mg at 03/30/15 1122  . sodium chloride 0.9 % injection 3 mL  3 mL Intravenous Q12H Ivor Costa, MD   3 mL at 03/30/15 1123  . sodium chloride 0.9 % injection 3 mL  3 mL Intravenous PRN Ivor Costa, MD      . temazepam (RESTORIL) capsule 15 mg  15 mg Oral QHS PRN Ivor Costa, MD      . tiotropium [facility]) inhalation capsule 18 mcg  18 mcg Inhalation Daily Ivor Costa, MD   18 mcg at 03/30/15 0827    Allergies as of 03/29/2015 - Review Complete 02/26/2015  Allergen Reaction Noted  . Penicillins Anaphylaxis 03/26/2012  . Lyrica [pregabalin] Swelling 07/05/2014  . Rocephin [ceftriaxone sodium in dextrose]  02/26/2015  . Azithromycin Hives and Rash 03/26/2012  . Bactrim [sulfamethoxazole-trimethoprim] Hives and Rash 03/26/2012  . Catapres [clonidine hcl] Itching and Rash 03/22/2013  . Clonidine derivatives Itching and Rash 09/21/2013  . Sulfa antibiotics Hives and Rash 03/26/2012    Family History  Problem Relation Age of Onset  . CAD    . Heart disease      Social History   Social History  . Marital Status: Single    Spouse Name: N/A  . Number of Children: 4  . Years of Education: GED   Occupational History  . Not on file.   Social History Main Topics  . Smoking status: Former Smoker    Types: Cigarettes    Start date: 08/15/1963    Quit date: 04/01/1975  . Smokeless tobacco: Never Used  . Alcohol Use: No  . Drug Use: No  . Sexual Activity: Not on file   Other Topics Concern  . Not on file   Social History Narrative   Patient is single with 4 children   Patient has a GED   Patient is right handed    Patient drinks 2 cups daily    Review of Systems:  ROS Dr. Blaine Hamper 03/30/15 reviewed and I agree  Physical Exam: Vital signs in last 24 hours: Temp:  [97.9 F (36.6 C)-98.7 F (37.1 C)] 98.7 F (37.1 C) (12/30 0924) Pulse Rate:  [70-75] 73 (12/30 0924) Resp:  [20-24] 20 (12/30 0924) BP: (118-138)/(37-57) 125/48 mmHg (12/30 0924) SpO2:  [95 %-99 %] 99 % (12/30 1158) Weight:  [134.038 kg (295 lb 8 oz)] 134.038 kg (295 lb 8 oz) (12/30 0256) Last BM Date: 03/29/15 General:   Alert, morbidly obese, dyspneic at rest, older-appearing than stated age Head:  Normocephalic and atraumatic. Eyes:  Sclera clear, no icterus.   Conjunctiva pink. Ears:  Normal auditory acuity. Nose:  No deformity, discharge,  or lesions. Mouth:  No deformity or lesions.  Oropharynx pink & moist. Neck:  Very thick but supple; no masses or thyromegaly. Lungs:  Moderate tachypneia at rest; scattered post-expiratory wheezes throughout; scattered wet rales worse bibasilar regions Heart: Irregularly irregular rhythm, normal rate; no murmurs, clicks, rubs,  or gallops. Abdomen:  Soft, obese, nontender and nondistended. No masses, hepatosplenomegaly or hernias noted. Normal bowel sounds, without guarding, and without rebound.     Msk:  Symmetrical without gross deformities. Normal posture. Pulses:  Normal pulses noted. Extremities:  3+ non-pitting edema bilateral lower extremities Neurologic:  Alert and  oriented x4;  Diffusely weak and debilitated due to her anemia and size Skin:  Scattered ecchymoses; otherwise intact without significant lesions or rashes. Cervical Nodes:  No significant cervical adenopathy. Psych:  Alert and cooperative. Normal mood and affect.   Lab Results:  Recent Labs  03/30/15 1040  WBC 5.7  HGB 6.5*  HCT 23.1*  PLT 199   BMET No results for input(s): NA, K, CL, CO2, GLUCOSE, BUN, CREATININE, CALCIUM in the last 72 hours. LFT No results for input(s): PROT, ALBUMIN, AST, ALT, ALKPHOS,  BILITOT, BILIDIR, IBILI in the last 72 hours. PT/INR  Recent Labs  03/30/15 1040  LABPROT 19.4*  INR 1.63*    Studies/Results: No results found.  Impression:  1.  Microcytic anemia.  Intermittent, longstanding for many years.  Endoscopy and colonoscopy showed gastric polyps and hemorrhoids otherwise normal per patient (I don't have the records). 2.  Occasional hematochezia, longstanding, suspect hemorrhoidal, had outside colonoscopy 2015. 3.  Obesity. 4.  Shortness of breath.  Likely multifactorial (COPD, volume overload, anemia). 5.  Atrial fibrillation with history of stroke, on Pradaxa. 6.  Multiple other medical problems.  Plan:  1.  At this time, patient is in no shape for endoscopic evaluation from pulmonary perspective. 2.  PPI. 3.  Hold Pradaxa. 4.  Agree with blood transfusion. 5.  Full liquid diet, advance as tolerated. 6.  Endoscopy once pulmonary status improves, Monday at earliest, patient would need anesthesia for sedation assistance. 7.  Obtain outside colonoscopy report within the next few days. 8.  If endoscopy is unrevealing, and assuming colonoscopy report didn't show anything alarming and bowel prep quality was adequate, would consider capsule endoscopy as next step in management.  While patient would be very bad double balloon enteroscopy candidate due to her obesity and multiple comorbidities, results of capsule endoscopy (if ultimately done) might at least help provide explanation for intermittent anemia and might help gauge risk of rebleeding upon reinitiation of anticoagulation. 8.  [facility] will revisit Sunday 04/01/15.   LOS: 0 days   Rene Gonsoulin M  03/30/2015, 12:15 PM  Pager [phone] If no answer or after 5 PM call [phone]

## 2015-03-30 NOTE — Progress Notes (Signed)
   Echocardiogram 2D Echocardiogram has been performed.  Caitlyn Hardy 03/30/2015, 1:42 PM

## 2015-03-30 NOTE — Progress Notes (Signed)
1 unit PRBC transfused, pt tolerated well, No any adverse reactions noted between and post-transfusion, post transfusion blood will be drawn at 8pm, Vitals stable, tolerating diet, no any other specific complain, will continue to monitor the patient

## 2015-03-30 NOTE — Progress Notes (Addendum)
*  PRELIMINARY RESULTS* Vascular Ultrasound Lower extremity venous duplex has been completed.  Preliminary findings: No evidence of DVT or baker's cyst. Patient has known venous insufficiency and ongoing leg edema.  Landry Mellow, RDMS, RVT  03/30/2015, 1:01 PM

## 2015-03-30 NOTE — H&P (Addendum)
[facility] History and Physical  Caitlyn Hardy [id number] DOB: 1945/05/11 DOA: 03/30/2015  Referring physician: ED physician PCP: [name], MD  Specialists:   Chief Complaint: Shortness of breath, cough, wheezing  HPI: Caitlyn Hardy is a 69 y.o. female with PMH of hypertension, hyperlipidemia, diabetes mellitus, COPD, asthma, on 2 L oxygen at home, GERD, depression, CAD, S/P CABG, obesity, RLS, stroke, atrial fibrillation on pradaxa, pulmonary hypertension, systolic congestive heart failure with the EF 35-40 percent, chronic kidney disease-III, who presents with shortness of breath, cough and wheezing.   Patient is transferred from [facility], accepted by Dr. Dreama Saa.  Patient reports that since yesterday she has been having worsening shortness of breath, cough and wheezing. She also has orthopnea. She coughs up brownish colored sputum. She does not have chest pain, fever or chills. She reports that she has chronic mild right upper quadrant abdominal pain, which has not changed recently. No diarrhea, symptoms of a UTI, unilateral weakness. No hematochezia, dark stool, hematemesis, or bleeding tendency.  In ED, patient was found to have hemoglobin 6.7 (per EDP's note, patient may had hemoglobin 11-12 in May of this year), lactate 1.63, negative troponin, BNP 471, WBC 2.95, temperature normal, no tachycardia, stable renal function, negative flu test. Chest x-ray showed pulmonary edema, but no infiltration. Patient admitted to inpatient for further evaluation and treatment.  Where does patient live?   At home   Can patient participate in ADLs?   Little   Review of Systems:   General: no fevers, chills, no changes in body weight, has poor appetite, has fatigue HEENT: no blurry vision, hearing changes or sore throat Pulm: has dyspnea, coughing, wheezing CV: no chest pain, palpitations Abd: no nausea, vomiting, has mild abdominal pain, diarrhea, constipation GU: no  dysuria, burning on urination, increased urinary frequency, hematuria  Ext: has leg edema Neuro: no unilateral weakness, numbness, or tingling, no vision change or hearing loss Skin: no rash MSK: No muscle spasm, no deformity, no limitation of range of movement in spin Heme: No easy bruising.  Travel history: No recent long distant travel.  Allergy:  Allergies  Allergen Reactions  . Penicillins Anaphylaxis  . Lyrica [Pregabalin] Swelling  . Rocephin [Ceftriaxone Sodium In Dextrose]     Rash/hives/itching  . Azithromycin Hives and Rash  . Bactrim [Sulfamethoxazole-Trimethoprim] Hives and Rash  . Catapres [Clonidine Hcl] Itching and Rash  . Clonidine Derivatives Itching and Rash  . Sulfa Antibiotics Hives and Rash    Past Medical History  Diagnosis Date  . CAD (coronary artery disease)     a. CABG 2001. b. PCI 2011. c. NSTEMI 12/13-03/2012 in prolonged hosp stay, felt demand isch with stable cath.  . Hypertension   . Persistent atrial fibrillation ([place])     a. Diagnosed 02/2012 - long hosp stay, difficult to control rates, LAA thrombus noted, needed emergent DCCV for VT/afib/hypotension 04/14/2012.  . Depression   . COPD (chronic obstructive pulmonary disease) ([place])   . Diabetes mellitus ([place])   . Chronic systolic CHF (congestive heart failure) ([place])     a. EF 40% 2011, down to 25% 03/2012 felt mixed isch/nonischemic (tachy-mediated).  . Chronic kidney disease   . H/O hiatal hernia   . Headache(784.0)   . Arthritis   . Anemia   . Thrombus of left atrial appendage     a. Possible LAA thrombus on TEE 04/01/12.  . Morbid obesity ([place])   . On home oxygen therapy   . Thrombocytopenia ([place])   .  Restless leg   . CVA (cerebral infarction)     Followed by Dr. Leonie Man  . Hx of amiodarone therapy     Atrial fibrillation  . HX: anticoagulation     Atrial fibrillation  . Cardiomyopathy, ischemic   . Ejection fraction     EF 40% in the past   //    EF 25% January, 2014   //   EF 30%,  echo, Aug 11, 2012, mid/apical anterior akinesis and anteroseptal akinesis and apical lateral akinesis and akinesis of the true apex.  . Pulmonary hypertension ([place])     PA pressure 65 mmHg, echo, Aug 11, 2012  . Sweating     May, 2014  . Dizziness   . Complication of anesthesia   . Myocardial infarction [facility])     [phone] . 2011 2014  . Stroke [facility])     a. Stroke 02/2012 with recurrent CVA in-hospital [id number] felt embolic.  . Anginal pain ([place])   . Sleep apnea     CPAP qhs  . Shortness of breath dyspnea   . Asthma   . Pneumonia     2014  . Cancer [facility])     hysterectomy 1979 r/t/ cancer    Past Surgical History  Procedure Laterality Date  . Coronary artery bypass graft  2001    x 4 vessels  . Abdominal hysterectomy    . ;    . Rhinosplasty      x 2  . Tonsillectomy    . Knee replacemnt  2000    total right  . Shoulder sx  2000    right bone spurs  . Cardiac catheterization  2013  . Tee without cardioversion  04/01/2012    Procedure: TRANSESOPHAGEAL ECHOCARDIOGRAM (TEE);  Surgeon: Thayer Headings, MD;  Location: [facility];  Service: Cardiovascular;  Laterality: N/A;  . Left and right heart catheterization with coronary/graft angiogram N/A 03/30/2012    Procedure: LEFT AND RIGHT HEART CATHETERIZATION WITH Beatrix Fetters;  Surgeon: Sherren Mocha, MD;  Location: [facility] CATH LAB;  Service: Cardiovascular;  Laterality: N/A;  . Cardioversion N/A 06/02/2014    Procedure: CARDIOVERSION;  Surgeon: Herminio Commons, MD;  Location: [facility];  Service: Endoscopy;  Laterality: N/A;    Social History:  reports that she quit smoking about 40 years ago. Her smoking use included Cigarettes. She started smoking about 51 years ago. She has never used smokeless tobacco. She reports that she does not drink alcohol or use illicit drugs.  Family History:  Family History  Problem Relation Age of Onset  . CAD    . Heart disease       Prior to Admission medications    Medication Sig Start Date End Date Taking? Authorizing Provider  albuterol (PROVENTIL HFA;VENTOLIN HFA) 108 (90 BASE) MCG/ACT inhaler Inhale 1-2 puffs into the lungs every 6 (six) hours as needed for wheezing or shortness of breath.    Historical Provider, MD  amiodarone (PACERONE) 200 MG tablet Take 1 tablet (200 mg total) by mouth daily. 06/12/14   Carlena Bjornstad, MD  atorvastatin (LIPITOR) 10 MG tablet Take 1 tablet (10 mg total) by mouth daily. 04/19/12   Dayna N Dunn, PA-C  Fe Fum-FA-B Cmp-C-Zn-Mg-Mn-Cu (HEMATINIC PLUS COMPLEX) 106-1 MG TABS Take by mouth daily.    Historical Provider, MD  fluticasone (FLONASE) 50 MCG/ACT nasal spray Place 2 sprays into the nose daily.    Historical Provider, MD  Fluticasone-Salmeterol (ADVAIR) 250-50 MCG/DOSE AEPB Inhale 1 puff into the  lungs every 12 (twelve) hours.    Historical Provider, MD  HYDROcodone-acetaminophen (NORCO) 10-325 MG per tablet Take 1 tablet by mouth every 6 (six) hours as needed for pain.    Historical Provider, MD  insulin aspart (NOVOLOG) 100 UNIT/ML injection Sliding Scale Insulin 0-15 Units, Subcutaneous, 3 times daily with meals Correction coverage: Moderate CBG < 70: implement hypoglycemia protocol CBG 70 - 120: 0 units CBG 121 - 150: 2 units CBG 151 - 200: 3 units CBG 201 - 250: 5 units CBG 251 - 300: 8 units CBG 301 - 350: 11 units CBG 351 - 400: 15 units CBG > 400: call MD 04/19/12   Dayna N Dunn, PA-C  ipratropium-albuterol (DUONEB) 0.5-2.5 (3) MG/3ML SOLN Take 3 mLs by nebulization every 6 (six) hours as needed.    Historical Provider, MD  isosorbide mononitrate (IMDUR) 30 MG 24 hr tablet Take 1 tablet (30 mg total) by mouth daily. 02/26/15   Herminio Commons, MD  levofloxacin (LEVAQUIN) 500 MG tablet Take 500 mg by mouth daily.    Historical Provider, MD  lidocaine (LIDODERM) 5 % PLACE 1 PATCH ONTO THE SKIN EVERY DAY. REMOVE AND DISCARD PATCH WITHIN 12 HOURS OR AS DIRECTED BY MD 06/18/14   Garvin Fila, MD   lisinopril (PRINIVIL,ZESTRIL) 20 MG tablet Take 0.5 tablets (10 mg total) by mouth daily.    Carlena Bjornstad, MD  montelukast (SINGULAIR) 10 MG tablet Take 1 tablet (10 mg total) by mouth at bedtime. 04/19/12   Dayna N Dunn, PA-C  NITROSTAT 0.4 MG SL tablet PLACE 1 TABLET UNDER TONGUE EVERY 5 MINUTES FOR 3 DOSES AS NEEDED FOR CHEST PAIN 02/16/14   Carlena Bjornstad, MD  NON FORMULARY CPAP nightly    Historical Provider, MD  NON FORMULARY Oxygen at 2L/min while out, prn other times.    Historical Provider, MD  omeprazole (PRILOSEC) 40 MG capsule Take 1 capsule (40 mg total) by mouth 2 (two) times daily. 06/17/13   Carlena Bjornstad, MD  omeprazole (PRILOSEC) 40 MG capsule TAKE 1 CAPSULE BY MOUTH TWICE DAILY 01/17/15   Sueanne Margarita, MD  potassium chloride SA (K-DUR,KLOR-CON) 20 MEQ tablet Take 2 tablets (40 mEq total) by mouth 2 (two) times daily. 04/19/12   Dayna N Dunn, PA-C  PRADAXA 150 MG CAPS capsule TAKE 1 CAPSULE BY MOUTH EVERY 12 HOURS 10/03/14   Carlena Bjornstad, MD  predniSONE (DELTASONE) 20 MG tablet Take 20 mg by mouth daily with breakfast.    Historical Provider, MD  rOPINIRole (REQUIP) 2 MG tablet Take 2 mg by mouth at bedtime.    Historical Provider, MD  sertraline (ZOLOFT) 50 MG tablet Take 50 mg by mouth daily.    Historical Provider, MD  temazepam (RESTORIL) 15 MG capsule Take 15 mg by mouth at bedtime as needed for sleep.    Historical Provider, MD  tiotropium (SPIRIVA) 18 MCG inhalation capsule Place 18 mcg into inhaler and inhale daily.    Historical Provider, MD  torsemide (DEMADEX) 20 MG tablet Take 20 mg by mouth 2 (two) times daily. Dose decrease 05/18/14    Historical Provider, MD    Physical Exam: Filed Vitals:   03/30/15 0256  BP: 138/57  Pulse: 75  Temp: 98.3 F (36.8 C)  TempSrc: Oral  Resp: 24  Height: 5\' 6"  (1.676 m)  Weight: 134.038 kg (295 lb 8 oz)  SpO2: 95%   General: Not in acute distress. Pale looking. HEENT:  Eyes: PERRL, EOMI, no scleral icterus.        ENT: No discharge from the ears and nose, no pharynx injection, no tonsillar enlargement.        Neck: Difficult to assess JVD due to obesity, no bruit, no mass felt. Heme: No neck lymph node enlargement. Cardiac: S1/S2, RRR, No murmurs, No gallops or rubs. Pulm: Has wheezing bilaterally. No rales or rubs. Abd: Soft, nondistended, nontender, no rebound pain, no organomegaly, BS present. Ext: 1+ pitting leg edema bilaterally, left leg is slightly worse than the right. Leg has venous insufficiency change.. 2+DP/PT pulse bilaterally. Musculoskeletal: No joint deformities, No joint redness or warmth, no limitation of ROM in spin. Skin: No rashes.  Neuro: Alert, oriented X3, cranial nerves II-XII grossly intact, muscle strength 5/5 in all extremities, sensation to light touch intact.  Psych: Patient is not psychotic, no suicidal or hemocidal ideation.  Labs on Admission:  Basic Metabolic Panel: No results for input(s): NA, K, CL, CO2, GLUCOSE, BUN, CREATININE, CALCIUM, MG, PHOS in the last 168 hours. Liver Function Tests: No results for input(s): AST, ALT, ALKPHOS, BILITOT, PROT, ALBUMIN in the last 168 hours. No results for input(s): LIPASE, AMYLASE in the last 168 hours. No results for input(s): AMMONIA in the last 168 hours. CBC: No results for input(s): WBC, NEUTROABS, HGB, HCT, MCV, PLT in the last 168 hours. Cardiac Enzymes: No results for input(s): CKTOTAL, CKMB, CKMBINDEX, TROPONINI in the last 168 hours.  BNP (last 3 results) No results for input(s): BNP in the last 8760 hours.  ProBNP (last 3 results) No results for input(s): PROBNP in the last 8760 hours.  CBG:  Recent Labs Lab 03/30/15 0311  GLUCAP 203*    Radiological Exams on Admission: No results found.  EKG: Independently reviewed. QTC 451, Q wave in lateral disc, poor R-wave progression   Assessment/Plan Principal Problem:   Acute on chronic respiratory failure (HCC) Active Problems:   Microcytic anemia    Acute on chronic systolic heart failure (HCC)   Stroke (HCC)   Thrombus of left atrial appendage   Restless leg   CAD (coronary artery disease)   Hypertension   Paroxysmal atrial fibrillation (HCC)   Depression   COPD exacerbation (HCC)   Sleep apnea   Diabetes mellitus ([place])   Pulmonary hypertension (HCC)   CHF exacerbation (HCC)   CKD (chronic kidney disease), stage III   Acute on chronic respiratory failure ([place]): This is most likely due to combination of CHF and COPD exacerbation. Her worsening anemia may have partially contributed. ABG showed pH of 7.406, pCO2 38.3, PO2 55. Flu test is negative for A&B Antg.  -will admit to tele bed -Treated COPD and CHF exacerbation as below  COPD exacerbation: Patient has productive cough, shortness of breath and wheezing, consistent with COPD exacerbation. -Nebulizers: scheduled Duoneb and prn albuterol -continue home on Singulair and spiriva inhaler -Solu-Medrol 60 mg IV bid  -Oral doxy for 5 days.  -Mucinex for cough  -Urine S. pneumococcal antigen -Follow up blood culture x2, sputum culture -LE venous doppler to r/o DVT given asymmetric leg edema  Acute on chronic systolic heart failure ([place]): 2-D alcohol. 2/15 showed EF 35-40 percent. Patient is on torsemide at home. Her BNP is elevated at 471, plus 1+ leg edema and orthopnea, consistence with CHF exacerbation. -Lasix 60 mg bid by IV -trop x 3 -2d echo -start lisinopril 10 mg daily -Daily weights -No BB due to acute decompensation of CHF  Microcytic anemia: Hemoglobin 6.7. Per  EDP's note, patient may had hemoglobin 11-12 in May of this year). Patient is on pradaxa for atrial fibrillation. Though patient did not see blood in urine or stool, cannot rule out possibility of GI bleeding. Per EDP's note, patient had EGD 2015, which showed gastric polyp and hiatal hernia per EDP. She also had colonoscopy 2015, which was negative per pt.  -Hold Pradaxa now -cbc q6h -check  FOBT -transfuse 1 unit of blood now. -may call GI in AM if FOBT comes back positive.  DM-II: Last A1c 7.1 on 03/26/12, fairly controled. Patient is taking SSI at home -SSI -Check A1c  CAD: s/p of CABG. No CP now -prn NTG.  HTN: -on IV lasix and lisinopril  Atrial Fibrillation: CHA2DS2-VASc Score is 6, needs oral anticoagulation. Patient is on Pradaxa. INR is pending on admission. Heart rate is well controlled. -Hold Pradaxa due to possible GIB -Continue amiodarone  OSA: -CPAP  CKD (chronic kidney disease), stage III: stable. Baseline creatinine 1.2-1.4. Her creatinine is 1.3. -Follow-up renal function by BMP  DVT ppx: SCD only on right leg (need to r/o DVT in left leg)  Code Status: Full code Family Communication: None at bed side.   Disposition Plan: Admit to inpatient   Date of Service 03/30/2015    Ivor Costa [facility] Pager [phone]  If 7PM-7AM, please contact night-coverage [email or web address] Password [email or web address] 03/30/2015, 4:52 AM

## 2015-03-30 NOTE — Progress Notes (Signed)
Phlebotomist Edd Arbour came and drew blood as ordered but unsuccessful , will try later per phlebotomist. Dr. Blaine Hamper was notified

## 2015-03-30 NOTE — Progress Notes (Signed)
[facility] PROGRESS NOTE  Oneyda Dejoseph [id number] DOB: Dec 25, 1945 DOA: 03/30/2015 PCP: [name], MD  Please refer to detailed admission H&P from this morning, in brief 69 year old obese female with history of COPD on 2 L home oxygen, asthma, CAD status post CABG, chronic systolic CHF with EF of 123456, CK disease stage III (baseline creatinine around 1.6-1.8), hypertension, hyperlipidemia, type 2 diabetes mellitus, GERD, depression, history of stroke, A. fib on Protonix at, pulmonary hypertension who presented to [facility] with shortness of breath, cough and wheezing. He also reports brownish colored sputum. Denied any fevers, chills, chest pain, palpitation, bowel or urinary symptoms. Complain of mild right upper quadrant abdominal pain. In the ED she had a hemoglobin of 6.7 (reportedly was 11-12 in May of this year), BNP of 471, normal WBC and platelets and normal renal function. Flu PCR was negative. Chest x-ray showed pulmonary edema. She received her dose of IV Lasix, Solu-Medrol and nebs in the ED. She was referred for transfer to [facility] as no GI consults available there.     Assessment/Plan: Acute on chronic respiratory failure Likely a combination of CHF and COPD exacerbation. ABG done showing mild hypoxia with pH of 7.4, PCO2 of 38 and PO2 55. PCR negative.  Acute blood loss anemia Hemoglobin repeated and is 6.5. She is in Pradaxa for A. fib which has been held. Last EGD in 2015 showed gastric polyp and hiatal hernia. Patient also reports having a normal colonoscopy in 2015 in [place]. Holding Pradaxa. Ordered 1 unit PRBC. -Consulted [facility]. Dr. Paulita Fujita will see her. Stool for occult blood ordered. Continue PPI -H&H every 6 hours.  Acute COPD exacerbation Continue IV Solu-Medrol, DuoNeb, when necessary albuterol inhaler. Oral doxycycline. Mucinex.  Acute on chronic systolic CHF Continue IV Lasix 60 mg twice a day. Serial troponin. Check repeat  2-D echo. (Last echo from 2/15 with EF of 35-40%) Monitor strict I/O and daily weight Continue ACE inhibitor and Imdur.  Type 2 diabetes mellitus Monitor on sliding scale insulin.  Paroxysmal A. fib Holding Pradaxa. Continue amiodarone  Chronic kidney disease stage III Baseline creatinine 1.6-1.8. Appears to be stable. Will follow-up with labs   DVT prophylaxis: On Pradaxa for A. fib which is on hold for GI bleed Diet: Clear liquid.  Patient is a very hard stick. And a diffuse PICC line given her CTD stage III I have discussed with PC CM Dr. Curt Jews for central line. He recommended to try peripheral access given her anticoagulation status.  Code Status: Full code Family Communication: None at bedside Disposition Plan: Currently inpatient   Consultants:  [facility]  Procedures:  2d echo  Antibiotics:  doxycyclline  HPI/Subjective: Seen and examined. Admission H&P reviewed. Patient reports her breathing to be better. Denies melena or hematemesis  Objective: Filed Vitals:   03/30/15 0608 03/30/15 0924  BP: 118/37 125/48  Pulse: 70 73  Temp: 97.9 F (36.6 C) 98.7 F (37.1 C)  Resp: 20 20    Intake/Output Summary (Last 24 hours) at 03/30/15 1157 Last data filed at 03/30/15 [id number]  Gross per 24 hour  Intake      0 ml  Output   1000 ml  Net  -1000 ml   Filed Weights   03/30/15 0256  Weight: 134.038 kg (295 lb 8 oz)    Exam:   General:  Elderly obese female not in distress  HEENT: Pallor present, moist mucosa, no JVD  Chest: Scattered rhonchi with diminished bibasilar breath sounds  Cardiovascular:  S1 and S2 regular, no murmurs rub or gallop  GI: Soft, nondistended, nontender, bowel sounds present  Discussed goals: Warm, trace edema bilaterally  CNS: Alert and oriented    Data Reviewed: Basic Metabolic Panel: No results for input(s): NA, K, CL, CO2, GLUCOSE, BUN, CREATININE, CALCIUM, MG, PHOS in the last 168 hours. Liver Function Tests: No  results for input(s): AST, ALT, ALKPHOS, BILITOT, PROT, ALBUMIN in the last 168 hours. No results for input(s): LIPASE, AMYLASE in the last 168 hours. No results for input(s): AMMONIA in the last 168 hours. CBC:  Recent Labs Lab 03/30/15 1040  WBC 5.7  HGB 6.5*  HCT 23.1*  MCV 79.1  PLT 199   Cardiac Enzymes: No results for input(s): CKTOTAL, CKMB, CKMBINDEX, TROPONINI in the last 168 hours. BNP (last 3 results) No results for input(s): BNP in the last 8760 hours.  ProBNP (last 3 results) No results for input(s): PROBNP in the last 8760 hours.  CBG:  Recent Labs Lab 03/30/15 0311 03/30/15 0613 03/30/15 1114  GLUCAP 203* 184* 168*    Recent Results (from the past 240 hour(s))  MRSA PCR Screening     Status: Abnormal   Collection Time: 03/30/15  3:00 AM  Result Value Ref Range Status   MRSA by PCR POSITIVE (A) NEGATIVE Final    Comment:        The GeneXpert MRSA Assay (FDA approved for NASAL specimens only), is one component of a comprehensive MRSA colonization surveillance program. It is not intended to diagnose MRSA infection nor to guide or monitor treatment for MRSA infections. RESULT CALLED TO, READ BACK BY AND VERIFIED WITH: A BUENDIA @0450  03/30/15 MKELLY      Studies: No results found.  Scheduled Meds: . sodium chloride   Intravenous Once  . amiodarone  200 mg Oral Daily  . atorvastatin  10 mg Oral Daily  . Chlorhexidine Gluconate Cloth  6 each Topical Q0600  . dextromethorphan-guaiFENesin  1 tablet Oral BID  . doxycycline  100 mg Oral Q12H  . ferrous [id number] C-folic acid  1 capsule Oral Daily  . fluticasone  2 spray Each Nare Daily  . furosemide  60 mg Intravenous BID  . ipratropium-albuterol  3 mL Nebulization Q4H  . isosorbide mononitrate  30 mg Oral Daily  . lidocaine  1 patch Transdermal Q24H  . lisinopril  10 mg Oral Daily  . methylPREDNISolone (SOLU-MEDROL) injection  60 mg Intravenous Q12H  . montelukast  10 mg Oral QHS   . multivitamin with minerals  1 tablet Oral Daily  . mupirocin ointment  1 application Nasal BID  . pantoprazole  40 mg Oral Daily  . [START ON 03/31/2015] pneumococcal 23 valent vaccine  0.5 mL Intramuscular Tomorrow-1000  . sertraline  50 mg Oral Daily  . sodium chloride  3 mL Intravenous Q12H  . tiotropium  18 mcg Inhalation Daily   Continuous Infusions:      Time spent: 20 minutes    Sydney Hasten, [place]  [facility] Pager [phone]. If 7PM-7AM, please contact night-coverage at [email or web address], password [email or web address] 03/30/2015, 11:57 AM  LOS: 0 days

## 2015-03-31 ENCOUNTER — Encounter (HOSPITAL_COMMUNITY): Payer: Self-pay | Admitting: *Deleted

## 2015-03-31 DIAGNOSIS — I130 Hypertensive heart and chronic kidney disease with heart failure and stage 1 through stage 4 chronic kidney disease, or unspecified chronic kidney disease: Secondary | ICD-10-CM | POA: Diagnosis not present

## 2015-03-31 LAB — CBC
HCT: 24.6 % — ABNORMAL LOW (ref 36.0–46.0)
HCT: 23.7 % — ABNORMAL LOW (ref 36.0–46.0)
HEMATOCRIT: 26 % — AB (ref 36.0–46.0)
Hemoglobin: 7.1 g/dL — ABNORMAL LOW (ref 12.0–15.0)
Hemoglobin: 7.4 g/dL — ABNORMAL LOW (ref 12.0–15.0)
Hemoglobin: 6.9 g/dL — CL (ref 12.0–15.0)
MCH: 22.9 pg — AB (ref 26.0–34.0)
MCH: 22.8 pg — ABNORMAL LOW (ref 26.0–34.0)
MCH: 23.3 pg — AB (ref 26.0–34.0)
MCHC: 28.9 g/dL — AB (ref 30.0–36.0)
MCHC: 28.5 g/dL — AB (ref 30.0–36.0)
MCHC: 29.1 g/dL — AB (ref 30.0–36.0)
MCV: 79.4 fL (ref 78.0–100.0)
MCV: 80 fL (ref 78.0–100.0)
MCV: 80.1 fL (ref 78.0–100.0)
PLATELETS: 203 10*3/uL (ref 150–400)
PLATELETS: 251 10*3/uL (ref 150–400)
PLATELETS: 213 10*3/uL (ref 150–400)
RBC: 3.1 MIL/uL — AB (ref 3.87–5.11)
RBC: 3.25 MIL/uL — ABNORMAL LOW (ref 3.87–5.11)
RBC: 2.96 MIL/uL — AB (ref 3.87–5.11)
RDW: 17.2 % — ABNORMAL HIGH (ref 11.5–15.5)
RDW: 17.3 % — AB (ref 11.5–15.5)
RDW: 17.4 % — ABNORMAL HIGH (ref 11.5–15.5)
WBC: 8.9 10*3/uL (ref 4.0–10.5)
WBC: 10 10*3/uL (ref 4.0–10.5)
WBC: 8.4 10*3/uL (ref 4.0–10.5)

## 2015-03-31 LAB — IRON AND TIBC
IRON: 71 ug/dL (ref 28–170)
Saturation Ratios: 17 % (ref 10.4–31.8)
TIBC: 423 ug/dL (ref 250–450)
UIBC: 352 ug/dL

## 2015-03-31 LAB — BASIC METABOLIC PANEL
Anion gap: 9 (ref 5–15)
BUN: 31 mg/dL — AB (ref 6–20)
CO2: 26 mmol/L (ref 22–32)
CREATININE: 1.65 mg/dL — AB (ref 0.44–1.00)
Calcium: 8.6 mg/dL — ABNORMAL LOW (ref 8.9–10.3)
Chloride: 105 mmol/L (ref 101–111)
GFR calc Af Amer: 36 mL/min — ABNORMAL LOW (ref >60)
GFR, EST NON AFRICAN AMERICAN: 31 mL/min — AB (ref >60)
GLUCOSE: 160 mg/dL — AB (ref 65–99)
POTASSIUM: 4.8 mmol/L (ref 3.5–5.1)
SODIUM: 140 mmol/L (ref 135–145)

## 2015-03-31 LAB — GLUCOSE, CAPILLARY
GLUCOSE-CAPILLARY: 144 mg/dL — AB (ref 65–99)
GLUCOSE-CAPILLARY: 164 mg/dL — AB (ref 65–99)
Glucose-Capillary: 153 mg/dL — ABNORMAL HIGH (ref 65–99)
Glucose-Capillary: 185 mg/dL — ABNORMAL HIGH (ref 65–99)

## 2015-03-31 LAB — RETICULOCYTES
RBC.: 3.27 MIL/uL — AB (ref 3.87–5.11)
RETIC COUNT ABSOLUTE: 107.9 10*3/uL (ref 19.0–186.0)
RETIC CT PCT: 3.3 % — AB (ref 0.4–3.1)

## 2015-03-31 LAB — HEMOGLOBIN A1C
HEMOGLOBIN A1C: 6.3 % — AB (ref 4.8–5.6)
MEAN PLASMA GLUCOSE: 134 mg/dL

## 2015-03-31 LAB — PREPARE RBC (CROSSMATCH)

## 2015-03-31 LAB — LACTATE DEHYDROGENASE: LDH: 225 U/L — ABNORMAL HIGH (ref 98–192)

## 2015-03-31 LAB — FERRITIN: FERRITIN: 29 ng/mL (ref 11–307)

## 2015-03-31 LAB — FOLATE: Folate: 15.5 ng/mL (ref >5.9)

## 2015-03-31 LAB — VITAMIN B12: VITAMIN B 12: 272 pg/mL (ref 180–914)

## 2015-03-31 MED ORDER — FUROSEMIDE 10 MG/ML IJ SOLN: 40.0000 mg | Freq: Once | INTRAMUSCULAR | Status: DC

## 2015-03-31 MED ORDER — IPRATROPIUM-ALBUTEROL 0.5-2.5 (3) MG/3ML IN SOLN
3.0000 mL | Freq: Four times a day (QID) | RESPIRATORY_TRACT | Status: DC
  Administered 2015-03-31 – 2015-04-10 (×39): 3 mL via RESPIRATORY_TRACT
  Filled 2015-03-31 (×38): qty 3

## 2015-03-31 MED ORDER — SODIUM CHLORIDE 0.9 % IV SOLN: Freq: Once | INTRAVENOUS | Status: DC

## 2015-03-31 NOTE — Progress Notes (Addendum)
[facility] Progress Note   Caitlyn Hardy  [id number]  DOB: 1946-02-06  DOA: 03/30/2015 PCP: [name], MD  Brief narrative: Caitlyn Hardy is a 69 y.o. female with COPD on 2 L of oxygen at home, CAD status post CABG, systolic heart failure with an EF of 35-40%, chronic kidney disease stage III, hypertension, hyperlipidemia, diabetes mellitus type 2, history of CVA, atrial fibrillation, pulmonary hypertension and obesity. Patient presents for shortness of breath which has been steadily progressing for the past 2 weeks. It is at the point where she can barely move around without getting short of breath. She has been coughing up greenish colored mucus. Chest x-ray performed in the ER at [facility] revealed congestive heart failure. Also found to be quite anemic and was given 1 unit of packed red blood cell transfusion.   Subjective: Continues to be short of breath but does not feel as weak as she did yesterday.  Assessment/Plan: Principal Problem:   Acute on chronic respiratory failure (HCC) -Due to acute bronchitis and CHF exacerbation  Active Problems:   Microcytic anemia -GI following-further workup planned for when respiratory status improves -Received one unit of packed red blood cells-continue to follow hemoglobin to see if she needs further transfusions -No overt blood loss noted -Anemia panel pending    Acute on chronic systolic heart failure  --Continue to diuresis- on lisinopril  Acute bronchitis - cont Doxycyline, Nebs, dextro/mucinex, Singulair, Spiriva - add flutter valve    Stroke  -start baby aspirin    Paroxysmal atrial fibrillation - hold Pradaxa    Sleep apnea - start CPAP    Diabetes mellitus  - cont sliding scale     CKD (chronic kidney disease), stage III -at baseline-  follow    Antibiotics: Anti-infectives    Start     Dose/Rate Route Frequency Ordered Stop   03/30/15 1000  doxycycline (VIBRA-TABS) tablet 100 mg      100 mg Oral Every 12 hours 03/30/15 0420       Code Status:     Code Status Orders        Start     Ordered   03/30/15 0417  Full code   Continuous     03/30/15 0418     Family Communication: none Disposition Plan:  DVT prophylaxis: SCDs Consultants:  GI Procedures:  none    Objective: Filed Weights   03/30/15 0256 03/31/15 0601  Weight: 134.038 kg (295 lb 8 oz) 133.221 kg (293 lb 11.2 oz)    Intake/Output Summary (Last 24 hours) at 03/31/15 1103 Last data filed at 03/31/15 1000  Gross per 24 hour  Intake   1055 ml  Output   1200 ml  Net   -145 ml     Vitals Filed Vitals:   03/30/15 2359 03/31/15 0016 03/31/15 0601 03/31/15 0853  BP: 135/55  125/72   Pulse: 74 72 82   Temp: 98.2 F (36.8 C)  98.7 F (37.1 C)   TempSrc: Oral  Oral   Resp: 18 16 18    Height:      Weight:   133.221 kg (293 lb 11.2 oz)   SpO2: 95%  94% 100%    Exam:  General:  Pt is alert, not in acute distress  HEENT: No icterus, No thrush, oral mucosa moist  Cardiovascular: regular rate and rhythm, S1/S2 No murmur  Respiratory: b/l rhonchi and wheeze  Abdomen: Soft, +Bowel sounds, non tender, non distended, no guarding  MSK: No cyanosis or  clubbing- no pedal edema   Data Reviewed: Basic Metabolic Panel:  Recent Labs Lab 03/31/15 0501  NA 140  K 4.8  CL 105  CO2 26  GLUCOSE 160*  BUN 31*  CREATININE 1.65*  CALCIUM 8.6*   Liver Function Tests: No results for input(s): AST, ALT, ALKPHOS, BILITOT, PROT, ALBUMIN in the last 168 hours. No results for input(s): LIPASE, AMYLASE in the last 168 hours. No results for input(s): AMMONIA in the last 168 hours. CBC:  Recent Labs Lab 03/30/15 1040 03/30/15 1939 03/30/15 2202 03/31/15 0501  WBC 5.7 7.9 9.3 8.9  HGB 6.5* 6.9* 7.4* 7.1*  HCT 23.1* 23.6* 26.2* 24.6*  MCV 79.1 79.5 79.4 79.4  PLT 199 193 230 203   Cardiac Enzymes:  Recent Labs Lab 03/30/15 1040 03/30/15 1939  TROPONINI <0.03 0.03   BNP (last 3  results) No results for input(s): BNP in the last 8760 hours.  ProBNP (last 3 results) No results for input(s): PROBNP in the last 8760 hours.  CBG:  Recent Labs Lab 03/30/15 0613 03/30/15 1114 03/30/15 1640 03/30/15 2108 03/31/15 0553  GLUCAP 184* 168* 141* 157* 144*    Recent Results (from the past 240 hour(s))  MRSA PCR Screening     Status: Abnormal   Collection Time: 03/30/15  3:00 AM  Result Value Ref Range Status   MRSA by PCR POSITIVE (A) NEGATIVE Final    Comment:        The GeneXpert MRSA Assay (FDA approved for NASAL specimens only), is one component of a comprehensive MRSA colonization surveillance program. It is not intended to diagnose MRSA infection nor to guide or monitor treatment for MRSA infections. RESULT CALLED TO, READ BACK BY AND VERIFIED WITH: A BUENDIA @0450  03/30/15 MKELLY   Culture, blood (routine x 2) Call MD if unable to obtain prior to antibiotics being given     Status: None (Preliminary result)   Collection Time: 03/30/15 10:40 AM  Result Value Ref Range Status   Specimen Description BLOOD LEFT ARM  Final   Special Requests BOTTLES DRAWN AEROBIC ONLY 5CC  Final   Culture NO GROWTH < 24 HOURS  Final   Report Status PENDING  Incomplete  Culture, blood (routine x 2) Call MD if unable to obtain prior to antibiotics being given     Status: None (Preliminary result)   Collection Time: 03/30/15 10:50 AM  Result Value Ref Range Status   Specimen Description BLOOD LEFT ANTECUBITAL  Final   Special Requests BOTTLES DRAWN AEROBIC ONLY 5CC  Final   Culture NO GROWTH < 24 HOURS  Final   Report Status PENDING  Incomplete     Studies: No results found.  Scheduled Meds:  Scheduled Meds: . amiodarone  200 mg Oral Daily  . atorvastatin  10 mg Oral Daily  . Chlorhexidine Gluconate Cloth  6 each Topical Q0600  . dextromethorphan-guaiFENesin  1 tablet Oral BID  . doxycycline  100 mg Oral Q12H  . ferrous Q000111Q C-folic acid  1  capsule Oral Daily  . fluticasone  2 spray Each Nare Daily  . furosemide  60 mg Intravenous BID  . insulin aspart  0-15 Units Subcutaneous TID WC  . insulin aspart  0-5 Units Subcutaneous QHS  . ipratropium-albuterol  3 mL Nebulization QID  . isosorbide mononitrate  30 mg Oral Daily  . lidocaine  1 patch Transdermal Q24H  . lisinopril  10 mg Oral Daily  . methylPREDNISolone (SOLU-MEDROL) injection  60 mg Intravenous Q12H  .  montelukast  10 mg Oral QHS  . multivitamin with minerals  1 tablet Oral Daily  . mupirocin ointment  1 application Nasal BID  . pantoprazole (PROTONIX) IV  40 mg Intravenous Q12H  . sertraline  50 mg Oral Daily  . sodium chloride  3 mL Intravenous Q12H  . tiotropium  18 mcg Inhalation Daily   Continuous Infusions:   Time spent on care of this patient: 35 min   [place], MD 03/31/2015, 11:03 AM  LOS: 1 day   [facility] Office  [phone] Pager - Text Page per [email or web address] If 7PM-7AM, please contact night-coverage [email or web address]

## 2015-03-31 NOTE — Progress Notes (Signed)
Pt has her home CPAP unit at bedside. She has refilled humidity chamber. She states she will put on when she is ready to go to bed. She will call RN to hook up 2L O2 when ready. Pt understands to call with questions/concerns. RT will monitor.

## 2015-04-01 LAB — BASIC METABOLIC PANEL
ANION GAP: 9 (ref 5–15)
BUN: 40 mg/dL — ABNORMAL HIGH (ref 6–20)
CALCIUM: 9 mg/dL (ref 8.9–10.3)
CO2: 30 mmol/L (ref 22–32)
Chloride: 102 mmol/L (ref 101–111)
Creatinine, Ser: 1.92 mg/dL — ABNORMAL HIGH (ref 0.44–1.00)
GFR, EST AFRICAN AMERICAN: 30 mL/min — AB (ref >60)
GFR, EST NON AFRICAN AMERICAN: 26 mL/min — AB (ref >60)
GLUCOSE: 163 mg/dL — AB (ref 65–99)
POTASSIUM: 5.4 mmol/L — AB (ref 3.5–5.1)
Sodium: 141 mmol/L (ref 135–145)

## 2015-04-01 LAB — TYPE AND SCREEN
ABO/RH(D): O POS
ANTIBODY SCREEN: NEGATIVE
Unit division: 0
Unit division: 0

## 2015-04-01 LAB — HEMOGLOBIN AND HEMATOCRIT, BLOOD
HEMATOCRIT: 25.7 % — AB (ref 36.0–46.0)
HEMOGLOBIN: 7.6 g/dL — AB (ref 12.0–15.0)

## 2015-04-01 LAB — GLUCOSE, CAPILLARY
GLUCOSE-CAPILLARY: 140 mg/dL — AB (ref 65–99)
GLUCOSE-CAPILLARY: 137 mg/dL — AB (ref 65–99)
Glucose-Capillary: 138 mg/dL — ABNORMAL HIGH (ref 65–99)
Glucose-Capillary: 151 mg/dL — ABNORMAL HIGH (ref 65–99)

## 2015-04-01 LAB — TECHNOLOGIST SMEAR REVIEW

## 2015-04-01 LAB — PROTIME-INR
INR: 1.21 (ref 0.00–1.49)
Prothrombin Time: 15.5 seconds — ABNORMAL HIGH (ref 11.6–15.2)

## 2015-04-01 MED ORDER — METHYLPREDNISOLONE SODIUM SUCC 40 MG IJ SOLR
30.0000 mg | Freq: Two times a day (BID) | INTRAMUSCULAR | Status: DC
  Administered 2015-04-01 – 2015-04-02 (×2): 30 mg via INTRAVENOUS
  Filled 2015-04-01 (×2): qty 1

## 2015-04-01 NOTE — Progress Notes (Signed)
GASTROENTEROLOGY PROGRESS NOTE  Problem:   Microcytic anemia and COPD exacerbation.  Subjective: The patient states that her breathing is substantially better than it was on admission, but nowhere near back to baseline.  Patient received the second of 2 units of blood yesterday. No visible bleeding.  Objective: Hemoglobin this morning 7.6 after 2 units of blood, as compared to 7.1 at time of admission.   Iron studies were obtained after the first unit of blood was transfused, and came back in the low normal range.  Patient appears mildly dyspneic sitting on the side that.  Assessment: 1. Chronic anemia, possibly due to iron deficiency 2. COPD exacerbation, improving  Plan: 1. Obtain stool for occult blood 2. Patient states that she had endoscopy and colonoscopy at [facility] in [place], [place], by Dr. Danne Harbor, in 2015.  I will request those records. 3. Reassess patient in a couple of days. Depending on what information we get from the outside hospital (if any), what her stool Hemoccult status is, what her hemoglobin is doing, and how much her breathing has improved, we will decide whether to do any GI testing during this admission. Taking into account the patient's multiple medical problems (COPD on 2 L of oxygen at home, CAD status post CABG, systolic heart failure with an EF of 35-40%, chronic kidney disease stage III, hypertension, hyperlipidemia, diabetes mellitus type 2, history of CVA, atrial fibrillation, pulmonary hypertension and obesity), it would be best to avoid unnecessary testing, and to do any endoscopic procedures with the help of monitored anesthesia care.  Cleotis Nipper, M.D. 04/01/2015 3:24 PM  Pager [phone] If no answer or after 5 PM call [phone]

## 2015-04-01 NOTE — Progress Notes (Addendum)
[facility] Progress Note   Caitlyn Hardy  [id number]  DOB: 04-24-1945  DOA: 03/30/2015 PCP: [name], MD  Brief narrative: Caitlyn Hardy is a 70 y.o. female with COPD on 2 L of oxygen at home, CAD status post CABG, systolic heart failure with an EF of 35-40%, chronic kidney disease stage III, hypertension, hyperlipidemia, diabetes mellitus type 2, history of CVA, atrial fibrillation, pulmonary hypertension and obesity. Patient presents for shortness of breath which has been steadily progressing for the past 2 weeks. It is at the point where she can barely move around without getting short of breath. She has been coughing up greenish colored mucus. Chest x-ray performed in the ER at [facility] revealed congestive heart failure. Also, found to be quite anemic with Hb of 6.   Subjective: Continues to be short of breath but does not feel as weak as she did yesterday.  Assessment/Plan: Principal Problem:   Acute on chronic respiratory failure (HCC) -Due to acute bronchitis and CHF exacerbation  Active Problems:  Microcytic anemia -GI following-further workup planned for when respiratory status improves -Received 2 units of packed red blood cells-continue to follow hemoglobin to see if she needs further transfusions -No overt blood loss noted but despite 2 U of blood, Hb only 7.6- follow -Anemia panel suggest AOCD with low normal ferretin and B12 level-  - LDH slightly elevated but no schistocytes on smear   Acute on chronic systolic heart failure  --Continue to diurese with Lasix 60 BID - neg balance by 4500 cc  Hyperkalemia - hold Lisinopril- follow   Acute bronchitis - cont Doxycyline, Nebs, dextro/mucinex, Singulair, Spiriva - add flutter valve    Stroke  -start baby aspirin    Paroxysmal atrial fibrillation - hold Pradaxa due to anemia- currently NSR -cont Amiodarone    Sleep apnea - start CPAP    Diabetes mellitus  - cont sliding  scale     CKD (chronic kidney disease), stage III -at baseline-  follow    Antibiotics: Anti-infectives    Start     Dose/Rate Route Frequency Ordered Stop   03/30/15 1000  doxycycline (VIBRA-TABS) tablet 100 mg     100 mg Oral Every 12 hours 03/30/15 0420       Code Status:     Code Status Orders        Start     Ordered   03/30/15 0417  Full code   Continuous     03/30/15 0418     Family Communication: none Disposition Plan:  DVT prophylaxis: SCDs Consultants:  GI Procedures:  none    Objective: Filed Weights   03/30/15 0256 03/31/15 0601 04/01/15 0622  Weight: 134.038 kg (295 lb 8 oz) 133.221 kg (293 lb 11.2 oz) 132.087 kg (291 lb 3.2 oz)    Intake/Output Summary (Last 24 hours) at 04/01/15 1002 Last data filed at 04/01/15 0834  Gross per 24 hour  Intake    888 ml  Output   2100 ml  Net  -1212 ml     Vitals Filed Vitals:   04/01/15 0031 04/01/15 0622 04/01/15 0831 04/01/15 0938  BP: 143/65 138/56 146/71   Pulse: 68 72 69   Temp: 97.5 F (36.4 C) 97.8 F (36.6 C) 98.6 F (37 C)   TempSrc: Oral Oral Oral   Resp: 18 17 16    Height:      Weight:  132.087 kg (291 lb 3.2 oz)    SpO2: 100% 98% 98% 100%  Exam:  General:  Pt is alert, not in acute distress  HEENT: No icterus, No thrush, oral mucosa moist  Cardiovascular: regular rate and rhythm, S1/S2 No murmur  Respiratory: b/l rhonchi and wheeze  Abdomen: Soft, +Bowel sounds, non tender, non distended, no guarding  MSK: No cyanosis or clubbing- no pedal edema   Data Reviewed: Basic Metabolic Panel:  Recent Labs Lab 03/31/15 0501 04/01/15 0233  NA 140 141  K 4.8 5.4*  CL 105 102  CO2 26 30  GLUCOSE 160* 163*  BUN 31* 40*  CREATININE 1.65* 1.92*  CALCIUM 8.6* 9.0   Liver Function Tests: No results for input(s): AST, ALT, ALKPHOS, BILITOT, PROT, ALBUMIN in the last 168 hours. No results for input(s): LIPASE, AMYLASE in the last 168 hours. No results for input(s): AMMONIA in  the last 168 hours. CBC:  Recent Labs Lab 03/30/15 1939 03/30/15 2202 03/31/15 0501 03/31/15 1145 03/31/15 1625 04/01/15 0738  WBC 7.9 9.3 8.9 10.0 8.4  --   HGB 6.9* 7.4* 7.1* 7.4* 6.9* 7.6*  HCT 23.6* 26.2* 24.6* 26.0* 23.7* 25.7*  MCV 79.5 79.4 79.4 80.0 80.1  --   PLT 193 230 203 251 213  --    Cardiac Enzymes:  Recent Labs Lab 03/30/15 1040 03/30/15 1939  TROPONINI <0.03 0.03   BNP (last 3 results) No results for input(s): BNP in the last 8760 hours.  ProBNP (last 3 results) No results for input(s): PROBNP in the last 8760 hours.  CBG:  Recent Labs Lab 03/31/15 0553 03/31/15 1149 03/31/15 1649 03/31/15 2146 04/01/15 0549  GLUCAP 144* 164* 153* 185* 138*    Recent Results (from the past 240 hour(s))  MRSA PCR Screening     Status: Abnormal   Collection Time: 03/30/15  3:00 AM  Result Value Ref Range Status   MRSA by PCR POSITIVE (A) NEGATIVE Final    Comment:        The GeneXpert MRSA Assay (FDA approved for NASAL specimens only), is one component of a comprehensive MRSA colonization surveillance program. It is not intended to diagnose MRSA infection nor to guide or monitor treatment for MRSA infections. RESULT CALLED TO, READ BACK BY AND VERIFIED WITH: A BUENDIA @0450  03/30/15 MKELLY   Culture, blood (routine x 2) Call MD if unable to obtain prior to antibiotics being given     Status: None (Preliminary result)   Collection Time: 03/30/15 10:40 AM  Result Value Ref Range Status   Specimen Description BLOOD LEFT ARM  Final   Special Requests BOTTLES DRAWN AEROBIC ONLY 5CC  Final   Culture NO GROWTH < 24 HOURS  Final   Report Status PENDING  Incomplete  Culture, blood (routine x 2) Call MD if unable to obtain prior to antibiotics being given     Status: None (Preliminary result)   Collection Time: 03/30/15 10:50 AM  Result Value Ref Range Status   Specimen Description BLOOD LEFT ANTECUBITAL  Final   Special Requests BOTTLES DRAWN AEROBIC ONLY  5CC  Final   Culture NO GROWTH < 24 HOURS  Final   Report Status PENDING  Incomplete     Studies: No results found.  Scheduled Meds:  Scheduled Meds: . sodium chloride   Intravenous Once  . amiodarone  200 mg Oral Daily  . atorvastatin  10 mg Oral Daily  . Chlorhexidine Gluconate Cloth  6 each Topical Q0600  . dextromethorphan-guaiFENesin  1 tablet Oral BID  . doxycycline  100 mg Oral Q12H  . ferrous  Q000111Q C-folic acid  1 capsule Oral Daily  . fluticasone  2 spray Each Nare Daily  . furosemide  60 mg Intravenous BID  . insulin aspart  0-15 Units Subcutaneous TID WC  . insulin aspart  0-5 Units Subcutaneous QHS  . ipratropium-albuterol  3 mL Nebulization QID  . isosorbide mononitrate  30 mg Oral Daily  . lidocaine  1 patch Transdermal Q24H  . methylPREDNISolone (SOLU-MEDROL) injection  60 mg Intravenous Q12H  . montelukast  10 mg Oral QHS  . multivitamin with minerals  1 tablet Oral Daily  . mupirocin ointment  1 application Nasal BID  . pantoprazole (PROTONIX) IV  40 mg Intravenous Q12H  . sertraline  50 mg Oral Daily  . sodium chloride  3 mL Intravenous Q12H  . tiotropium  18 mcg Inhalation Daily   Continuous Infusions:   Time spent on care of this patient: 35 min   [place], MD 04/01/2015, 10:02 AM  LOS: 2 days   [facility] Office  [phone] Pager - Text Page per [email or web address] If 7PM-7AM, please contact night-coverage [email or web address]

## 2015-04-02 LAB — CBC
HCT: 25.8 % — ABNORMAL LOW (ref 36.0–46.0)
Hemoglobin: 7.5 g/dL — ABNORMAL LOW (ref 12.0–15.0)
MCH: 22.9 pg — ABNORMAL LOW (ref 26.0–34.0)
MCHC: 29.1 g/dL — ABNORMAL LOW (ref 30.0–36.0)
MCV: 78.9 fL (ref 78.0–100.0)
PLATELETS: 232 10*3/uL (ref 150–400)
RBC: 3.27 MIL/uL — AB (ref 3.87–5.11)
RDW: 17.8 % — AB (ref 11.5–15.5)
WBC: 9 10*3/uL (ref 4.0–10.5)

## 2015-04-02 LAB — BASIC METABOLIC PANEL
Anion gap: 8 (ref 5–15)
BUN: 43 mg/dL — AB (ref 6–20)
CALCIUM: 8.8 mg/dL — AB (ref 8.9–10.3)
CHLORIDE: 100 mmol/L — AB (ref 101–111)
CO2: 32 mmol/L (ref 22–32)
CREATININE: 1.8 mg/dL — AB (ref 0.44–1.00)
GFR, EST AFRICAN AMERICAN: 32 mL/min — AB (ref >60)
GFR, EST NON AFRICAN AMERICAN: 28 mL/min — AB (ref >60)
Glucose, Bld: 144 mg/dL — ABNORMAL HIGH (ref 65–99)
Potassium: 5 mmol/L (ref 3.5–5.1)
SODIUM: 140 mmol/L (ref 135–145)

## 2015-04-02 LAB — GLUCOSE, CAPILLARY
GLUCOSE-CAPILLARY: 106 mg/dL — AB (ref 65–99)
GLUCOSE-CAPILLARY: 139 mg/dL — AB (ref 65–99)
GLUCOSE-CAPILLARY: 121 mg/dL — AB (ref 65–99)
Glucose-Capillary: 116 mg/dL — ABNORMAL HIGH (ref 65–99)

## 2015-04-02 MED ORDER — CYANOCOBALAMIN 1000 MCG/ML IJ SOLN
1000.0000 ug | Freq: Every day | INTRAMUSCULAR | Status: AC
  Administered 2015-04-02 – 2015-04-04 (×3): 1000 ug via SUBCUTANEOUS
  Filled 2015-04-02 (×3): qty 1

## 2015-04-02 MED ORDER — METHYLPREDNISOLONE SODIUM SUCC 40 MG IJ SOLR
30.0000 mg | INTRAMUSCULAR | Status: DC
  Administered 2015-04-03: 30 mg via INTRAVENOUS
  Filled 2015-04-02: qty 1

## 2015-04-02 NOTE — Progress Notes (Signed)
[facility] Progress Note   [name]  [id number]  DOB: [birth year]  DOA: 03/30/2015 PCP: [name], MD  Brief narrative: Caitlyn Hardy is a 70 y.o. female with COPD on 2 L of oxygen at home, CAD status post CABG, systolic heart failure with an EF of 35-40%, chronic kidney disease stage III, hypertension, hyperlipidemia, diabetes mellitus type 2, history of CVA, atrial fibrillation, pulmonary hypertension and obesity. Patient presents for shortness of breath which has been steadily progressing for the past 2 weeks. It is at the point where she can barely move around without getting short of breath. She has been coughing up greenish colored mucus. Chest x-ray performed in the ER at [facility] revealed congestive heart failure. Also, found to be quite anemic with Hb of 6.   Subjective: Breathing and coughing is improving.   Assessment/Plan: Principal Problem:   Acute on chronic respiratory failure (HCC) -Due to acute bronchitis and CHF exacerbation  Active Problems:  Microcytic anemia -GI following-further workup planned for when respiratory status improves -Received 2 units of packed red blood cells-continue to follow hemoglobin to see if she needs further transfusions -No overt blood loss noted but despite 2 U of blood, Hb only 7.6- follow -Anemia panel suggest AOCD with low normal ferretin and B12 level-  - LDH slightly elevated but no schistocytes on smear - f/u on hemoccult - review records from [facility] regarding prior endosdopies   Acute on chronic systolic heart failure  --Continue to diurese with Lasix 60 BID - neg balance by almost 6 L  Hyperkalemia - hold Lisinopril- has improved slightly to 5.0 - cont to hold Lisinopril  Acute bronchitis - cont Doxycyline, Nebs, dextro/mucinex, Singulair, Spiriva - added flutter valve   h/o  Stroke  -may have been due to PAF    Paroxysmal atrial fibrillation - hold Pradaxa due to anemia- currently  NSR -cont Amiodarone    Sleep apnea - start CPAP    Diabetes mellitus  - cont sliding scale     CKD (chronic kidney disease), stage III -at baseline-  follow    Antibiotics: Anti-infectives    Start     Dose/Rate Route Frequency Ordered Stop   03/30/15 1000  doxycycline (VIBRA-TABS) tablet 100 mg     100 mg Oral Every 12 hours 03/30/15 0420       Code Status:     Code Status Orders        Start     Ordered   03/30/15 0417  Full code   Continuous     03/30/15 0418     Family Communication: none Disposition Plan:  DVT prophylaxis: SCDs Consultants:  GI Procedures:  none    Objective: Filed Weights   03/31/15 0601 04/01/15 0622 04/02/15 0515  Weight: 133.221 kg (293 lb 11.2 oz) 132.087 kg (291 lb 3.2 oz) 131.09 kg (289 lb)    Intake/Output Summary (Last 24 hours) at 04/02/15 0950 Last data filed at 04/02/15 0245  Gross per 24 hour  Intake    110 ml  Output   3550 ml  Net  -3440 ml     Vitals Filed Vitals:   04/01/15 1936 04/01/15 2020 04/02/15 0515 04/02/15 0921  BP:  125/82 143/72   Pulse:  67 76   Temp:  98.4 F (36.9 C) 97.5 F (36.4 C)   TempSrc:  Oral Oral   Resp:  16 18   Height:      Weight:   131.09 kg (289 lb)  SpO2: 99% 98% 95% 96%    Exam:  General:  Pt is alert, not in acute distress  HEENT: No icterus, No thrush, oral mucosa moist  Cardiovascular: regular rate and rhythm, S1/S2 No murmur  Respiratory: b/l rhonchi and wheeze  Abdomen: Soft, +Bowel sounds, non tender, non distended, no guarding  MSK: No cyanosis or clubbing- no pedal edema   Data Reviewed: Basic Metabolic Panel:  Recent Labs Lab 03/31/15 0501 04/01/15 0233 04/02/15 0417  NA 140 141 140  K 4.8 5.4* 5.0  CL 105 102 100*  CO2 26 30 32  GLUCOSE 160* 163* 144*  BUN 31* 40* 43*  CREATININE 1.65* 1.92* 1.80*  CALCIUM 8.6* 9.0 8.8*   Liver Function Tests: No results for input(s): AST, ALT, ALKPHOS, BILITOT, PROT, ALBUMIN in the last 168 hours. No  results for input(s): LIPASE, AMYLASE in the last 168 hours. No results for input(s): AMMONIA in the last 168 hours. CBC:  Recent Labs Lab 03/30/15 2202 03/31/15 0501 03/31/15 1145 03/31/15 1625 04/01/15 0738 04/02/15 0417  WBC 9.3 8.9 10.0 8.4  --  9.0  HGB 7.4* 7.1* 7.4* 6.9* 7.6* 7.5*  HCT 26.2* 24.6* 26.0* 23.7* 25.7* 25.8*  MCV 79.4 79.4 80.0 80.1  --  78.9  PLT 230 203 251 213  --  232   Cardiac Enzymes:  Recent Labs Lab 03/30/15 1040 03/30/15 1939  TROPONINI <0.03 0.03   BNP (last 3 results) No results for input(s): BNP in the last 8760 hours.  ProBNP (last 3 results) No results for input(s): PROBNP in the last 8760 hours.  CBG:  Recent Labs Lab 04/01/15 0549 04/01/15 1155 04/01/15 1705 04/01/15 2103 04/02/15 0632  GLUCAP 138* 151* 140* 137* 106*    Recent Results (from the past 240 hour(s))  MRSA PCR Screening     Status: Abnormal   Collection Time: 03/30/15  3:00 AM  Result Value Ref Range Status   MRSA by PCR POSITIVE (A) NEGATIVE Final    Comment:        The GeneXpert MRSA Assay (FDA approved for NASAL specimens only), is one component of a comprehensive MRSA colonization surveillance program. It is not intended to diagnose MRSA infection nor to guide or monitor treatment for MRSA infections. RESULT CALLED TO, READ BACK BY AND VERIFIED WITH: A BUENDIA @0450  03/30/15 MKELLY   Culture, blood (routine x 2) Call MD if unable to obtain prior to antibiotics being given     Status: None (Preliminary result)   Collection Time: 03/30/15 10:40 AM  Result Value Ref Range Status   Specimen Description BLOOD LEFT ARM  Final   Special Requests BOTTLES DRAWN AEROBIC ONLY 5CC  Final   Culture NO GROWTH 2 DAYS  Final   Report Status PENDING  Incomplete  Culture, blood (routine x 2) Call MD if unable to obtain prior to antibiotics being given     Status: None (Preliminary result)   Collection Time: 03/30/15 10:50 AM  Result Value Ref Range Status    Specimen Description BLOOD LEFT ANTECUBITAL  Final   Special Requests BOTTLES DRAWN AEROBIC ONLY 5CC  Final   Culture NO GROWTH 2 DAYS  Final   Report Status PENDING  Incomplete     Studies: No results found.  Scheduled Meds:  Scheduled Meds: . sodium chloride   Intravenous Once  . amiodarone  200 mg Oral Daily  . atorvastatin  10 mg Oral Daily  . Chlorhexidine Gluconate Cloth  6 each Topical Q0600  . dextromethorphan-guaiFENesin  1 tablet Oral BID  . doxycycline  100 mg Oral Q12H  . ferrous Q000111Q C-folic acid  1 capsule Oral Daily  . fluticasone  2 spray Each Nare Daily  . furosemide  60 mg Intravenous BID  . insulin aspart  0-15 Units Subcutaneous TID WC  . insulin aspart  0-5 Units Subcutaneous QHS  . ipratropium-albuterol  3 mL Nebulization QID  . isosorbide mononitrate  30 mg Oral Daily  . lidocaine  1 patch Transdermal Q24H  . [START ON 04/03/2015] methylPREDNISolone (SOLU-MEDROL) injection  30 mg Intravenous Q24H  . montelukast  10 mg Oral QHS  . multivitamin with minerals  1 tablet Oral Daily  . mupirocin ointment  1 application Nasal BID  . pantoprazole (PROTONIX) IV  40 mg Intravenous Q12H  . sertraline  50 mg Oral Daily  . sodium chloride  3 mL Intravenous Q12H  . tiotropium  18 mcg Inhalation Daily   Continuous Infusions:   Time spent on care of this patient: 35 min   [place], MD 04/02/2015, 9:50 AM  LOS: 3 days   [facility] Office  [phone] Pager - Text Page per [email or web address] If 7PM-7AM, please contact night-coverage [email or web address]

## 2015-04-03 ENCOUNTER — Inpatient Hospital Stay (HOSPITAL_COMMUNITY): Payer: Medicare Other

## 2015-04-03 LAB — BASIC METABOLIC PANEL
Anion gap: 12 (ref 5–15)
BUN: 45 mg/dL — AB (ref 6–20)
CHLORIDE: 96 mmol/L — AB (ref 101–111)
CO2: 31 mmol/L (ref 22–32)
CREATININE: 1.79 mg/dL — AB (ref 0.44–1.00)
Calcium: 9.1 mg/dL (ref 8.9–10.3)
GFR, EST AFRICAN AMERICAN: 32 mL/min — AB (ref >60)
GFR, EST NON AFRICAN AMERICAN: 28 mL/min — AB (ref >60)
Glucose, Bld: 89 mg/dL (ref 65–99)
Potassium: 3.8 mmol/L (ref 3.5–5.1)
SODIUM: 139 mmol/L (ref 135–145)

## 2015-04-03 LAB — CBC
HCT: 33.2 % — ABNORMAL LOW (ref 36.0–46.0)
HEMOGLOBIN: 9.7 g/dL — AB (ref 12.0–15.0)
MCH: 23.4 pg — AB (ref 26.0–34.0)
MCHC: 29.2 g/dL — AB (ref 30.0–36.0)
MCV: 80 fL (ref 78.0–100.0)
PLATELETS: 262 10*3/uL (ref 150–400)
RBC: 4.15 MIL/uL (ref 3.87–5.11)
RDW: 18.2 % — ABNORMAL HIGH (ref 11.5–15.5)
WBC: 9.8 10*3/uL (ref 4.0–10.5)

## 2015-04-03 LAB — GLUCOSE, CAPILLARY
GLUCOSE-CAPILLARY: 178 mg/dL — AB (ref 65–99)
GLUCOSE-CAPILLARY: 207 mg/dL — AB (ref 65–99)
GLUCOSE-CAPILLARY: 157 mg/dL — AB (ref 65–99)
GLUCOSE-CAPILLARY: 121 mg/dL — AB (ref 65–99)
Glucose-Capillary: 84 mg/dL (ref 65–99)

## 2015-04-03 LAB — OCCULT BLOOD X 1 CARD TO LAB, STOOL: FECAL OCCULT BLD: POSITIVE — AB

## 2015-04-03 LAB — EXPECTORATED SPUTUM ASSESSMENT W REFEX TO RESP CULTURE

## 2015-04-03 LAB — EXPECTORATED SPUTUM ASSESSMENT W GRAM STAIN, RFLX TO RESP C

## 2015-04-03 MED ORDER — TORSEMIDE 20 MG PO TABS
20.0000 mg | ORAL_TABLET | Freq: Two times a day (BID) | ORAL | Status: DC
  Administered 2015-04-03 – 2015-04-10 (×14): 20 mg via ORAL
  Filled 2015-04-03 (×15): qty 1

## 2015-04-03 MED ORDER — PREDNISONE 20 MG PO TABS
40.0000 mg | ORAL_TABLET | Freq: Every day | ORAL | Status: DC
  Administered 2015-04-04 – 2015-04-05 (×2): 40 mg via ORAL
  Filled 2015-04-03 (×3): qty 2

## 2015-04-03 MED ORDER — LISINOPRIL 10 MG PO TABS
10.0000 mg | ORAL_TABLET | Freq: Every day | ORAL | Status: DC
  Administered 2015-04-03 – 2015-04-09 (×7): 10 mg via ORAL
  Filled 2015-04-03 (×7): qty 1

## 2015-04-03 NOTE — Progress Notes (Signed)
   04/03/15 2113  BiPAP/CPAP/SIPAP  BiPAP/CPAP/SIPAP Pt Type (Home unit)  BiPAP/CPAP/SIPAP CPAP  Patient Home Equipment Yes  Auto Titrate Yes  BiPAP/CPAP /SiPAP Vitals  Bilateral Breath Sounds Expiratory wheezes  Patient has her CPAP home unit. She said she will put it on herself around 2300. RT advised patient to call for any problem with the equipment.

## 2015-04-03 NOTE — Progress Notes (Signed)
Pt placed self on home cpap, RT checked connections and water chamber. Pt informed to call for RT if she needs any assistance during the night with cpap

## 2015-04-03 NOTE — Progress Notes (Addendum)
[facility] Progress Note   Caitlyn Hardy  [id number]  DOB: Apr 12, 1945  DOA: 03/30/2015 PCP: [name], MD  Brief narrative: Caitlyn Hardy is a 70 y.o. female with COPD on 2 L of oxygen at home, CAD status post CABG, systolic heart failure with an EF of 35-40%, chronic kidney disease stage III, hypertension, hyperlipidemia, diabetes mellitus type 2, history of CVA, atrial fibrillation, pulmonary hypertension and obesity. Patient presents for shortness of breath which has been steadily progressing for the past 2 weeks. It is at the point where she can barely move around without getting short of breath. She has been coughing up greenish colored mucus. Chest x-ray performed in the ER at [facility] revealed congestive heart failure. Also, found to be quite anemic with Hb of 6.   Subjective: Breathing and coughing continues to improve.   Assessment/Plan: Principal Problem:   Acute on chronic respiratory failure (HCC) -Due to acute bronchitis and CHF exacerbation- improving - wean O2 as able  Active Problems:  Microcytic anemia -GI following-further workup planned for when respiratory status improves -Received 2 units of packed red blood cells-continue to follow hemoglobin to see if she needs further transfusions -No overt blood loss noted but despite 2 U of blood, Hb only 7.6- follow -Anemia panel suggest AOCD with low normal ferretin and B12 level-  - LDH slightly elevated but no schistocytes on smear - f/u on hemoccult  - records from [place] regarding prior endosdopies in 2015 in chart reveal she had polyps in the past - Pradaxa on hold   Acute on chronic systolic heart failure  - neg balance by 11 L- repeat CXR today --has been diuresing well with IV Lasix 60 BID-will switch to oral Demadex now -  may need to increase Demadex home dose on discharge  Acute bronchitis/ AECOPD - cont Doxycyline, Nebs, dextro/mucinex, Singulair, Spiriva - weaning  steroids - added flutter valve  Hyperkalemia - occurring after blood transfusion and likely secondary to it -have been holding Lisinopril- as K+  has improved, can resume Lisinopril tomorrow   h/o  Stroke  -may have been due to PAF    Paroxysmal atrial fibrillation - hold Pradaxa due to anemia- currently NSR -cont Amiodarone    Sleep apnea -  CPAP    Diabetes mellitus  - cont sliding scale     CKD (chronic kidney disease), stage 3-4 -at baseline-  follow    Antibiotics: Anti-infectives    Start     Dose/Rate Route Frequency Ordered Stop   03/30/15 1000  doxycycline (VIBRA-TABS) tablet 100 mg     100 mg Oral Every 12 hours 03/30/15 0420       Code Status:     Code Status Orders        Start     Ordered   03/30/15 0417  Full code   Continuous     03/30/15 0418     Family Communication: none Disposition Plan: awaiting GI work up to determine if anticoagulation can be resumed DVT prophylaxis: SCDs Consultants:  GI Procedures:  none    Objective: Filed Weights   04/01/15 0622 04/02/15 0515 04/03/15 0519  Weight: 132.087 kg (291 lb 3.2 oz) 131.09 kg (289 lb) 128.05 kg (282 lb 4.8 oz)    Intake/Output Summary (Last 24 hours) at 04/03/15 1119 Last data filed at 04/03/15 1033  Gross per 24 hour  Intake   1012 ml  Output   6650 ml  Net  -5638 ml  Vitals Filed Vitals:   04/02/15 2046 04/02/15 2113 04/03/15 0519 04/03/15 0827  BP: 125/41  131/54   Pulse: 61  62   Temp: 98.1 F (36.7 C)  97.9 F (36.6 C)   TempSrc: Oral  Oral   Resp: 20  21   Height:      Weight:   128.05 kg (282 lb 4.8 oz)   SpO2: 100% 97% 98% 99%    Exam:  General:  Pt is alert, not in acute distress  HEENT: No icterus, No thrush, oral mucosa moist  Cardiovascular: regular rate and rhythm, S1/S2 No murmur  Respiratory: b/l rhonchi and wheeze  Abdomen: Soft, +Bowel sounds, non tender, non distended, no guarding  MSK: No cyanosis or clubbing- no pedal edema   Data  Reviewed: Basic Metabolic Panel:  Recent Labs Lab 03/31/15 0501 04/01/15 0233 04/02/15 0417 04/03/15 0411  NA 140 141 140 139  K 4.8 5.4* 5.0 3.8  CL 105 102 100* 96*  CO2 26 30 32 31  GLUCOSE 160* 163* 144* 89  BUN 31* 40* 43* 45*  CREATININE 1.65* 1.92* 1.80* 1.79*  CALCIUM 8.6* 9.0 8.8* 9.1   Liver Function Tests: No results for input(s): AST, ALT, ALKPHOS, BILITOT, PROT, ALBUMIN in the last 168 hours. No results for input(s): LIPASE, AMYLASE in the last 168 hours. No results for input(s): AMMONIA in the last 168 hours. CBC:  Recent Labs Lab 03/31/15 0501 03/31/15 1145 03/31/15 1625 04/01/15 0738 04/02/15 0417 04/03/15 0411  WBC 8.9 10.0 8.4  --  9.0 9.8  HGB 7.1* 7.4* 6.9* 7.6* 7.5* 9.7*  HCT 24.6* 26.0* 23.7* 25.7* 25.8* 33.2*  MCV 79.4 80.0 80.1  --  78.9 80.0  PLT 203 251 213  --  232 262   Cardiac Enzymes:  Recent Labs Lab 03/30/15 1040 03/30/15 1939  TROPONINI <0.03 0.03   BNP (last 3 results) No results for input(s): BNP in the last 8760 hours.  ProBNP (last 3 results) No results for input(s): PROBNP in the last 8760 hours.  CBG:  Recent Labs Lab 04/02/15 0632 04/02/15 1139 04/02/15 1648 04/02/15 2045 04/03/15 0618  GLUCAP 106* 139* 121* 116* 84    Recent Results (from the past 240 hour(s))  MRSA PCR Screening     Status: Abnormal   Collection Time: 03/30/15  3:00 AM  Result Value Ref Range Status   MRSA by PCR POSITIVE (A) NEGATIVE Final    Comment:        The GeneXpert MRSA Assay (FDA approved for NASAL specimens only), is one component of a comprehensive MRSA colonization surveillance program. It is not intended to diagnose MRSA infection nor to guide or monitor treatment for MRSA infections. RESULT CALLED TO, READ BACK BY AND VERIFIED WITH: A BUENDIA @0450  03/30/15 MKELLY   Culture, blood (routine x 2) Call MD if unable to obtain prior to antibiotics being given     Status: None (Preliminary result)   Collection Time:  03/30/15 10:40 AM  Result Value Ref Range Status   Specimen Description BLOOD LEFT ARM  Final   Special Requests BOTTLES DRAWN AEROBIC ONLY 5CC  Final   Culture NO GROWTH 3 DAYS  Final   Report Status PENDING  Incomplete  Culture, blood (routine x 2) Call MD if unable to obtain prior to antibiotics being given     Status: None (Preliminary result)   Collection Time: 03/30/15 10:50 AM  Result Value Ref Range Status   Specimen Description BLOOD LEFT ANTECUBITAL  Final  Special Requests BOTTLES DRAWN AEROBIC ONLY 5CC  Final   Culture NO GROWTH 3 DAYS  Final   Report Status PENDING  Incomplete  Culture, sputum-assessment     Status: None   Collection Time: 04/03/15  8:50 AM  Result Value Ref Range Status   Specimen Description SPUTUM  Final   Special Requests NONE  Final   Sputum evaluation   Final    THIS SPECIMEN IS ACCEPTABLE. RESPIRATORY CULTURE REPORT TO FOLLOW.   Report Status 04/03/2015 FINAL  Final     Studies: No results found.  Scheduled Meds:  Scheduled Meds: . sodium chloride   Intravenous Once  . amiodarone  200 mg Oral Daily  . atorvastatin  10 mg Oral Daily  . Chlorhexidine Gluconate Cloth  6 each Topical Q0600  . cyanocobalamin  1,000 mcg Subcutaneous Daily  . dextromethorphan-guaiFENesin  1 tablet Oral BID  . doxycycline  100 mg Oral Q12H  . ferrous Q000111Q C-folic acid  1 capsule Oral Daily  . fluticasone  2 spray Each Nare Daily  . furosemide  60 mg Intravenous BID  . insulin aspart  0-15 Units Subcutaneous TID WC  . insulin aspart  0-5 Units Subcutaneous QHS  . ipratropium-albuterol  3 mL Nebulization QID  . isosorbide mononitrate  30 mg Oral Daily  . lidocaine  1 patch Transdermal Q24H  . methylPREDNISolone (SOLU-MEDROL) injection  30 mg Intravenous Q24H  . montelukast  10 mg Oral QHS  . multivitamin with minerals  1 tablet Oral Daily  . mupirocin ointment  1 application Nasal BID  . pantoprazole (PROTONIX) IV  40 mg Intravenous Q12H  .  sertraline  50 mg Oral Daily  . sodium chloride  3 mL Intravenous Q12H  . tiotropium  18 mcg Inhalation Daily   Continuous Infusions:   Time spent on care of this patient: 35 min   [place], MD 04/03/2015, 11:19 AM  LOS: 4 days   [facility] Office  [phone] Pager - Text Page per [email or web address] If 7PM-7AM, please contact night-coverage [email or web address]

## 2015-04-03 NOTE — Progress Notes (Signed)
SATURATION QUALIFICATIONS: (This note is used to comply with regulatory documentation for home oxygen)  Patient Saturations on 3 L [place] at Rest = 99%  Patient Saturations on 3 L [place] while Ambulating = 93%   Please briefly explain why patient needs home oxygen: Pt has home o2 set up.

## 2015-04-03 NOTE — NC FL2 (Signed)
[place] LEVEL OF CARE SCREENING TOOL     IDENTIFICATION  Patient Name: Caitlyn Hardy Birthdate: [birth year] Sex: female Admission Date (Current Location): 03/30/2015  [facility] and [place] Number:  Herbalist and Address:  [facility], [place] [address], [place], [place] [address]      Provider Number: [id number]  Attending Physician Name and Address:  Debbe Odea, MD  Relative Name and Phone Number:       Current Level of Care: Hospital Recommended Level of Care: Other (Comment) (Disposition to be determined.) Prior Approval Number:    Date Approved/Denied:   PASRR Number:  (Patient lives in [place], [place] not required for [place] placement.)  Discharge Plan: Other (Comment) (Disposition to be determined.)    Current Diagnoses: Patient Active Problem List   Diagnosis Date Noted  . Acute on chronic respiratory failure ([place]) 03/30/2015  . CHF exacerbation ([place]) 03/30/2015  . CKD (chronic kidney disease), stage III 03/30/2015  . Left leg pain 09/13/2014  . Chronic anticoagulation 07/05/2014  . Indigestion 06/17/2013  . Bradycardia 05/31/2013  . H/O amiodarone therapy 02/14/2013  . Dizziness   . Sweating   . Arthritis   . Stroke ([place])   . Thrombus of left atrial appendage   . Morbid obesity ([place])   . On home oxygen therapy   . VT (ventricular tachycardia) ([place])   . Thrombocytopenia ([place])   . Restless leg   . CAD (coronary artery disease)   . Hypertension   . Paroxysmal atrial fibrillation (HCC)   . Depression   . COPD exacerbation ([place])   . Sleep apnea   . Diabetes mellitus ([place])   . Chronic kidney disease   . H/O hiatal hernia   . Headache(784.0)   . Ejection fraction   . Pulmonary hypertension ([place])   . Cardiomyopathy, ischemic   . Acute on chronic systolic heart failure ([place]) 06/02/2012  . Microcytic anemia 03/26/2012    Orientation RESPIRATION BLADDER Height & Weight    Self, Time, Situation, Place  O2, Other (Comment) (Patient uses home CPAP) Continent 5\' 6"  (167.6 cm) 282 lbs.  BEHAVIORAL SYMPTOMS/MOOD NEUROLOGICAL BOWEL NUTRITION STATUS  Other (Comment) (n/a)  (n/a) Continent Diet (Please see discharge summary.)  AMBULATORY STATUS COMMUNICATION OF NEEDS Skin   Supervision Verbally Normal                       Personal Care Assistance Level of Assistance  Bathing, Feeding, Dressing Bathing Assistance: Independent Feeding assistance: Independent Dressing Assistance: Limited assistance     Functional Limitations Info   (n/a)          SPECIAL CARE FACTORS FREQUENCY  PT (By licensed PT), OT (By licensed OT)     PT Frequency: 5 OT Frequency: 5            Contractures      Additional Factors Info  Code Status, Allergies, Isolation Precautions Code Status Info: FULL Allergies Info: Penicillins, Lyrica Pregabalin, Rocephin Ceftriaxone Sodium In Dextrose, Azithromycin, Bactrim Sulfamethoxazole-trimethoprim, Catapres Clonidine Hcl, Clonidine Derivatives, Sulfa Antibiotics     Isolation Precautions Info: MRSA     Current Medications (04/03/2015):  This is the current hospital active medication list Current Facility-Administered Medications  Medication Dose Route Frequency Provider Last Rate Last Dose  . 0.9 %  sodium chloride infusion  250 mL Intravenous PRN Ivor Costa, MD      . 0.9 %  sodium chloride infusion   Intravenous Once  Debbe Odea, MD      . acetaminophen (TYLENOL) tablet 650 mg  650 mg Oral Q4H PRN Ivor Costa, MD      . albuterol (PROVENTIL) (2.5 MG/3ML) 0.083% nebulizer solution 2.5 mg  2.5 mg Nebulization Q2H PRN Ivor Costa, MD      . amiodarone (PACERONE) tablet 200 mg  200 mg Oral Daily Ivor Costa, MD   200 mg at 04/03/15 [id number]  . atorvastatin (LIPITOR) tablet 10 mg  10 mg Oral Daily Ivor Costa, MD   10 mg at 04/03/15 [id number]  . Chlorhexidine Gluconate Cloth 2 % PADS 6 each  6 each Topical Q0600 Ivor Costa, MD   6 each at 04/03/15 0600  . cyanocobalamin  ((VITAMIN B-12)) injection 1,000 mcg  1,000 mcg Subcutaneous Daily Debbe Odea, MD   1,000 mcg at 04/03/15 0937  . dextromethorphan-guaiFENesin (MUCINEX DM) 30-600 MG per 12 hr tablet 1 tablet  1 tablet Oral BID Ivor Costa, MD   1 tablet at 04/03/15 [phone]  . doxycycline (VIBRA-TABS) tablet 100 mg  100 mg Oral Q12H Ivor Costa, MD   100 mg at 04/03/15 0937  . ferrous Q000111Q C-folic acid (TRINSICON / FOLTRIN) capsule 1 capsule  1 capsule Oral Daily Ivor Costa, MD   1 capsule at 04/03/15 0937  . fluticasone (FLONASE) 50 MCG/ACT nasal spray 2 spray  2 spray Each Nare Daily Ivor Costa, MD   2 spray at 04/03/15 [phone]  . HYDROcodone-acetaminophen (NORCO) 10-325 MG per tablet 1 tablet  1 tablet Oral Q6H PRN Ivor Costa, MD   1 tablet at 04/03/15 0847  . insulin aspart (novoLOG) injection 0-15 Units  0-15 Units Subcutaneous TID WC Nishant Dhungel, MD   2 Units at 04/02/15 1140  . insulin aspart (novoLOG) injection 0-5 Units  0-5 Units Subcutaneous QHS Nishant Dhungel, MD   0 Units at 03/30/15 2200  . ipratropium-albuterol (DUONEB) 0.5-2.5 (3) MG/3ML nebulizer solution 3 mL  3 mL Nebulization QID John P Day, RRT   3 mL at 04/03/15 1136  . isosorbide mononitrate (IMDUR) 24 hr tablet 30 mg  30 mg Oral Daily Ivor Costa, MD   30 mg at 04/03/15 0937  . lidocaine (LIDODERM) 5 % 1 patch  1 patch Transdermal Q24H Ivor Costa, MD   1 patch at 04/03/15 [phone]  . lisinopril (PRINIVIL,ZESTRIL) tablet 10 mg  10 mg Oral Daily Debbe Odea, MD   10 mg at 04/03/15 1143  . montelukast (SINGULAIR) tablet 10 mg  10 mg Oral QHS Ivor Costa, MD   10 mg at 04/02/15 2055  . multivitamin with minerals tablet 1 tablet  1 tablet Oral Daily Ivor Costa, MD   1 tablet at 04/03/15 [phone]  . mupirocin ointment (BACTROBAN) 2 % 1 application  1 application Nasal BID Ivor Costa, MD   1 application at Q000111Q [phone]  . nitroGLYCERIN (NITROSTAT) SL tablet 0.4 mg  0.4 mg Sublingual Q5 min PRN Ivor Costa, MD      . ondansetron [facility]) injection 4 mg  4  mg Intravenous Q6H PRN Ivor Costa, MD      . pantoprazole (PROTONIX) injection 40 mg  40 mg Intravenous Q12H Arta Silence, MD   40 mg at 04/03/15 0937  . [START ON 04/04/2015] predniSONE (DELTASONE) tablet 40 mg  40 mg Oral Q breakfast Debbe Odea, MD      . sertraline (ZOLOFT) tablet 50 mg  50 mg Oral Daily Ivor Costa, MD   50 mg at 04/03/15 0937  . sodium chloride  0.9 % injection 3 mL  3 mL Intravenous Q12H Ivor Costa, MD   3 mL at 04/03/15 1000  . sodium chloride 0.9 % injection 3 mL  3 mL Intravenous PRN Ivor Costa, MD      . temazepam (RESTORIL) capsule 15 mg  15 mg Oral QHS PRN Ivor Costa, MD   15 mg at 04/02/15 2227  . tiotropium (SPIRIVA) inhalation capsule 18 mcg  18 mcg Inhalation Daily Ivor Costa, MD   18 mcg at 04/03/15 0824  . torsemide (DEMADEX) tablet 20 mg  20 mg Oral BID Debbe Odea, MD         Discharge Medications: Please see discharge summary for a list of discharge medications.  Relevant Imaging Results:  Relevant Lab Results:   Additional Information SSN: [id number]  Luna Kitchens [phone]

## 2015-04-03 NOTE — Progress Notes (Signed)
[facility] Progress Note  Subjective: Patient has no complaints had a bowel movement today, guaiac pending tolerating diet  Objective: Vital signs in last 24 hours: Temp:  [97.9 F (36.6 C)-98.1 F (36.7 C)] 97.9 F (36.6 C) (01/03 0519) Pulse Rate:  [61-62] 62 (01/03 0519) Resp:  [20-21] 21 (01/03 0519) BP: (125-131)/(41-54) 131/54 mmHg (01/03 0519) SpO2:  [97 %-100 %] 98 % (01/03 1137) Weight:  [128.05 kg (282 lb 4.8 oz)] 128.05 kg (282 lb 4.8 oz) (01/03 0519) Weight change: -3.039 kg (-6 lb 11.2 oz)   PE: Abdomen soft  Lab Results: Results for orders placed or performed during the hospital encounter of 03/30/15 (from the past 24 hour(s))  Glucose, capillary     Status: Abnormal   Collection Time: 04/02/15  4:48 PM  Result Value Ref Range   Glucose-Capillary 121 (H) 65 - 99 mg/dL   Comment 1 Notify RN   Glucose, capillary     Status: Abnormal   Collection Time: 04/02/15  8:45 PM  Result Value Ref Range   Glucose-Capillary 116 (H) 65 - 99 mg/dL   Comment 1 Notify RN    Comment 2 Document in Chart   CBC     Status: Abnormal   Collection Time: 04/03/15  4:11 AM  Result Value Ref Range   WBC 9.8 4.0 - 10.5 K/uL   RBC 4.15 3.87 - 5.11 MIL/uL   Hemoglobin 9.7 (L) 12.0 - 15.0 g/dL   HCT 33.2 (L) 36.0 - 46.0 %   MCV 80.0 78.0 - 100.0 fL   MCH 23.4 (L) 26.0 - 34.0 pg   MCHC 29.2 (L) 30.0 - 36.0 g/dL   RDW 18.2 (H) 11.5 - 15.5 %   Platelets 262 150 - 400 K/uL  Basic metabolic panel     Status: Abnormal   Collection Time: 04/03/15  4:11 AM  Result Value Ref Range   Sodium 139 135 - 145 mmol/L   Potassium 3.8 3.5 - 5.1 mmol/L   Chloride 96 (L) 101 - 111 mmol/L   CO2 31 22 - 32 mmol/L   Glucose, Bld 89 65 - 99 mg/dL   BUN 45 (H) 6 - 20 mg/dL   Creatinine, Ser 1.79 (H) 0.44 - 1.00 mg/dL   Calcium 9.1 8.9 - 10.3 mg/dL   GFR calc non Af Amer 28 (L) >60 mL/min   GFR calc Af Amer 32 (L) >60 mL/min   Anion gap 12 5 - 15  Glucose, capillary     Status: None   Collection Time: 04/03/15  6:18 AM  Result Value Ref Range   Glucose-Capillary 84 65 - 99 mg/dL   Comment 1 Notify RN   Culture, sputum-assessment     Status: None   Collection Time: 04/03/15  8:50 AM  Result Value Ref Range   Specimen Description SPUTUM    Special Requests NONE    Sputum evaluation      THIS SPECIMEN IS ACCEPTABLE. RESPIRATORY CULTURE REPORT TO FOLLOW.   Report Status 04/03/2015 FINAL   Glucose, capillary     Status: Abnormal   Collection Time: 04/03/15 11:40 AM  Result Value Ref Range   Glucose-Capillary 178 (H) 65 - 99 mg/dL    Studies/Results: Dg Chest Port 1 View  04/03/2015  CLINICAL DATA:  Congestive heart failure, cough and congestion Pt has has SOB, and is anemic. Hx of having a stroke 3 years ago EXAM: PORTABLE CHEST 1 VIEW COMPARISON:  06/01/2014 FINDINGS: There changes from prior CABG surgery, stable.  There is stable mild to moderate cardiomegaly. No mediastinal or hilar masses or convincing adenopathy. Mild stable scarring at the left lung base. Lungs otherwise clear. No pleural effusion or pneumothorax. Bony thorax is intact. IMPRESSION: No acute cardiopulmonary disease. Electronically Signed   By: Lajean Manes M.D.   On: 04/03/2015 11:57      Assessment: COPD exacerbation Anemia.   Plan: Await Hemoccults Await records from [place] where she had EGD and colonoscopy in 2015.    Alain Deschene C 04/03/2015, 12:49 PM  Pager [phone] If no answer or after 5 PM call [phone]

## 2015-04-04 LAB — CULTURE, BLOOD (ROUTINE X 2)
CULTURE: NO GROWTH
Culture: NO GROWTH

## 2015-04-04 LAB — GLUCOSE, CAPILLARY
GLUCOSE-CAPILLARY: 106 mg/dL — AB (ref 65–99)
GLUCOSE-CAPILLARY: 284 mg/dL — AB (ref 65–99)
Glucose-Capillary: 142 mg/dL — ABNORMAL HIGH (ref 65–99)
Glucose-Capillary: 91 mg/dL (ref 65–99)

## 2015-04-04 MED ORDER — PANTOPRAZOLE SODIUM 40 MG PO TBEC
40.0000 mg | DELAYED_RELEASE_TABLET | Freq: Two times a day (BID) | ORAL | Status: DC
  Administered 2015-04-04 – 2015-04-10 (×12): 40 mg via ORAL
  Filled 2015-04-04 (×12): qty 1

## 2015-04-04 NOTE — Progress Notes (Signed)
GI- records from [place] reviewed, no major findings on EGD/colon from 1.5 years ago. Hb stable. Hemoccults pending. See no compelling reason for repeat EGD. Will s/o

## 2015-04-04 NOTE — Progress Notes (Signed)
Physical Therapy Evaluation Patient Details Name: Caitlyn Hardy MRN: [id number] DOB: 1945/09/25 Today's Date: 04/04/2015   History of Present Illness  pt is a 70 y/o female with h/o RLS, CVA, cardiomyopathy, pulm HTN, MI, CA, PNA, admitted with SOB, cough, wheezing.  In ED found to have Hgb of 6.7.  Work up to include upper and lower GI scoping.  Clinical Impression  Pt admitted with/for SOB, cough, wheezing, due to COPD execerbation and CHF .  Pt currently limited functionally due to the problems listed below.  (see problems list.)  Pt will benefit from PT to maximize function and safety to be able to get home safely with available limited assist of family/friends.     Follow Up Recommendations Home health PT    Equipment Recommendations  None recommended by PT    Recommendations for Other Services       Precautions / Restrictions Precautions Precautions: Fall      Mobility  Bed Mobility Overal bed mobility: Modified Independent                Transfers Overall transfer level: Modified independent                  Ambulation/Gait Ambulation/Gait assistance: Supervision Ambulation Distance (Feet): 380 Feet Assistive device: Rolling walker (2 wheeled) Gait Pattern/deviations: Step-through pattern Gait velocity: slower Gait velocity interpretation: Below normal speed for age/gender General Gait Details: generally steady, pt reports L knee buckles without warning, but no signs of that level of weakness.  Stairs            Wheelchair Mobility    Modified Rankin (Stroke Patients Only)       Balance Overall balance assessment: Needs assistance Sitting-balance support: No upper extremity supported Sitting balance-Leahy Scale: Good     Standing balance support: No upper extremity supported Standing balance-Leahy Scale: Fair                               Pertinent Vitals/Pain Pain Assessment: Faces Faces Pain Scale: Hurts a little  bit Pain Location: vague joint pain Pain Descriptors / Indicators: Discomfort Pain Intervention(s): Monitored during session    Home Living Family/patient expects to be discharged to:: Private residence Living Arrangements: Alone Available Help at Discharge: Friend(s);Family;Available PRN/intermittently Type of Home: Apartment Home Access: Level entry     Home Layout: One level Home Equipment: Walker - 2 wheels;Walker - 4 wheels;Cane - single point;Bedside commode;Electric scooter      Prior Function Level of Independence: Independent with assistive device(s)               Hand Dominance        Extremity/Trunk Assessment   Upper Extremity Assessment: Defer to OT evaluation           Lower Extremity Assessment: Overall WFL for tasks assessed;Generalized weakness (proximal weakness > distal weakness bil)         Communication   Communication: No difficulties  Cognition Arousal/Alertness: Awake/alert Behavior During Therapy: WFL for tasks assessed/performed Overall Cognitive Status: Within Functional Limits for tasks assessed                      General Comments General comments (skin integrity, edema, etc.): On 3 L during gait, pt consistently dropped down into the 86/87% range with EHR in the low 90's on 3 separate checks.  With efficient breathing she quickly recovers to between 92-95% into the  upper 80's.  Raising O2 to 4L does not change her exertional SpO2 cue to being and mouth breather.    Exercises        Assessment/Plan    PT Assessment Patient needs continued PT services  PT Diagnosis Generalized weakness (decreased activity tolerance)   PT Problem List Decreased strength;Decreased activity tolerance;Decreased mobility;Decreased balance;Decreased knowledge of use of DME;Cardiopulmonary status limiting activity  PT Treatment Interventions DME instruction;Gait training;Functional mobility training;Therapeutic activities;Balance  training;Patient/family education   PT Goals (Current goals can be found in the Care Plan section) Acute Rehab PT Goals Patient Stated Goal: independent at home PT Goal Formulation: With patient Time For Goal Achievement: 04/11/15 Potential to Achieve Goals: Good    Frequency Min 3X/week   Barriers to discharge        Co-evaluation               End of Session   Activity Tolerance: Patient tolerated treatment well Patient left: with call bell/phone within reach (sitting EOB) Nurse Communication: Mobility status         Time: [id number] PT Time Calculation (min) (ACUTE ONLY): 34 min   Charges:   PT Evaluation $PT Eval Moderate Complexity: 1 Procedure PT Treatments $Gait Training: 8-22 mins   PT G Codes:        [name], Tessie Fass 04/04/2015, 2:09 PM  04/04/2015  Donnella Sham, PT [phone]  (pager)

## 2015-04-04 NOTE — Progress Notes (Signed)
[facility] Progress Note   Caitlyn Hardy  [id number]  DOB: 08-13-1945  DOA: 03/30/2015 PCP: [name], MD  Brief narrative: Caitlyn Hardy is a 70 y.o. female with COPD on 2 L of oxygen at home, CAD status post CABG, systolic heart failure with an EF of 35-40%, chronic kidney disease stage III, hypertension, hyperlipidemia, diabetes mellitus type 2, history of CVA, atrial fibrillation, pulmonary hypertension and obesity. Patient presents for shortness of breath which has been steadily progressing for the past 2 weeks. It is at the point where she can barely move around without getting short of breath. She has been coughing up greenish colored mucus. Chest x-ray performed in the ER at [facility] revealed congestive heart failure. Also, found to be quite anemic with Hb of 6.   Subjective: Breathing and coughing continues to improve-- wears 2-3 L at home Small BM yesterday but has some right sided abdominal pain  Assessment/Plan:   Acute on chronic respiratory failure (HCC) -Due to acute bronchitis and CHF exacerbation- improving - wean O2 as able   Microcytic anemia -GI following-further workup planned for when respiratory status improves -Received 2 units of packed red blood cells-continue to follow hemoglobin to see if she needs further transfusions -No overt blood loss noted but despite 2 U of blood, Hb only 7.6- follow -Anemia panel suggest AOCD with low normal ferretin and B12 level-  - LDH slightly elevated but no schistocytes on smear - POC HEME POSITIVE - records from [place] regarding prior endosdopies in 2015 in chart reveal she had polyps in the past - Pradaxa on hold   Acute on chronic systolic heart failure  - neg balance by 11.4 L- repeat CXR 1/3 show no disease --has been diuresing well with IV Lasix 60 BID-will switch to oral Demadex now -  may need to increase Demadex home dose on discharge  Acute bronchitis/ AECOPD - cont Doxycyline,  Nebs, dextro/mucinex, Singulair, Spiriva - weaning steroids on PO prednisone - added flutter valve  Hyperkalemia - occurring after blood transfusion and likely secondary to it ?resume lisinopril   h/o  Stroke  -may have been due to PAF    Paroxysmal atrial fibrillation - hold Pradaxa due to anemia- currently NSR -cont Amiodarone    Sleep apnea -  CPAP    Diabetes mellitus  - cont sliding scale     CKD (chronic kidney disease), stage 3-4 -at baseline-  follow    Antibiotics: Anti-infectives    Start     Dose/Rate Route Frequency Ordered Stop   03/30/15 1000  doxycycline (VIBRA-TABS) tablet 100 mg     100 mg Oral Every 12 hours 03/30/15 0420       Code Status:     Code Status Orders        Start     Ordered   03/30/15 0417  Full code   Continuous     03/30/15 0418     Family Communication: none Disposition Plan: awaiting GI work up to determine if anticoagulation can be resumed DVT prophylaxis: SCDs Consultants:  GI Procedures:  none    Objective: Filed Weights   04/02/15 0515 04/03/15 0519 04/04/15 0534  Weight: 131.09 kg (289 lb) 128.05 kg (282 lb 4.8 oz) 127.93 kg (282 lb 0.6 oz)    Intake/Output Summary (Last 24 hours) at 04/04/15 1016 Last data filed at 04/04/15 0901  Gross per 24 hour  Intake    240 ml  Output   2050 ml  Net  -  1810 ml     Vitals Filed Vitals:   04/03/15 1550 04/03/15 2049 04/04/15 0534 04/04/15 0804  BP:  115/47 124/55   Pulse:  68 64   Temp:  98.3 F (36.8 C) 98.4 F (36.9 C)   TempSrc:  Oral Oral   Resp:  21 19   Height:      Weight:   127.93 kg (282 lb 0.6 oz)   SpO2: 98% 96% 100% 99%    Exam:  General:  Pt is alert, not in acute distress  HEENT: No icterus, No thrush, oral mucosa moist  Cardiovascular: regular rate and rhythm, S1/S2 No murmur  Respiratory: expiratory wheeze, large body habitus  Abdomen: Soft, +Bowel sounds, non tender, non distended, no guarding  MSK: No cyanosis or clubbing- no  pedal edema   Data Reviewed: Basic Metabolic Panel:  Recent Labs Lab 03/31/15 0501 04/01/15 0233 04/02/15 0417 04/03/15 0411  NA 140 141 140 139  K 4.8 5.4* 5.0 3.8  CL 105 102 100* 96*  CO2 26 30 32 31  GLUCOSE 160* 163* 144* 89  BUN 31* 40* 43* 45*  CREATININE 1.65* 1.92* 1.80* 1.79*  CALCIUM 8.6* 9.0 8.8* 9.1   Liver Function Tests: No results for input(s): AST, ALT, ALKPHOS, BILITOT, PROT, ALBUMIN in the last 168 hours. No results for input(s): LIPASE, AMYLASE in the last 168 hours. No results for input(s): AMMONIA in the last 168 hours. CBC:  Recent Labs Lab 03/31/15 0501 03/31/15 1145 03/31/15 1625 04/01/15 0738 04/02/15 0417 04/03/15 0411  WBC 8.9 10.0 8.4  --  9.0 9.8  HGB 7.1* 7.4* 6.9* 7.6* 7.5* 9.7*  HCT 24.6* 26.0* 23.7* 25.7* 25.8* 33.2*  MCV 79.4 80.0 80.1  --  78.9 80.0  PLT 203 251 213  --  232 262   Cardiac Enzymes:  Recent Labs Lab 03/30/15 1040 03/30/15 1939  TROPONINI <0.03 0.03   BNP (last 3 results) No results for input(s): BNP in the last 8760 hours.  ProBNP (last 3 results) No results for input(s): PROBNP in the last 8760 hours.  CBG:  Recent Labs Lab 04/03/15 1140 04/03/15 1330 04/03/15 1626 04/03/15 2117 04/04/15 0552  GLUCAP 178* 207* 157* 121* 106*    Recent Results (from the past 240 hour(s))  MRSA PCR Screening     Status: Abnormal   Collection Time: 03/30/15  3:00 AM  Result Value Ref Range Status   MRSA by PCR POSITIVE (A) NEGATIVE Final    Comment:        The GeneXpert MRSA Assay (FDA approved for NASAL specimens only), is one component of a comprehensive MRSA colonization surveillance program. It is not intended to diagnose MRSA infection nor to guide or monitor treatment for MRSA infections. RESULT CALLED TO, READ BACK BY AND VERIFIED WITH: A BUENDIA @0450  03/30/15 MKELLY   Culture, blood (routine x 2) Call MD if unable to obtain prior to antibiotics being given     Status: None (Preliminary  result)   Collection Time: 03/30/15 10:40 AM  Result Value Ref Range Status   Specimen Description BLOOD LEFT ARM  Final   Special Requests BOTTLES DRAWN AEROBIC ONLY 5CC  Final   Culture NO GROWTH 4 DAYS  Final   Report Status PENDING  Incomplete  Culture, blood (routine x 2) Call MD if unable to obtain prior to antibiotics being given     Status: None (Preliminary result)   Collection Time: 03/30/15 10:50 AM  Result Value Ref Range Status  Specimen Description BLOOD LEFT ANTECUBITAL  Final   Special Requests BOTTLES DRAWN AEROBIC ONLY 5CC  Final   Culture NO GROWTH 4 DAYS  Final   Report Status PENDING  Incomplete  Culture, sputum-assessment     Status: None   Collection Time: 04/03/15  8:50 AM  Result Value Ref Range Status   Specimen Description SPUTUM  Final   Special Requests NONE  Final   Sputum evaluation   Final    THIS SPECIMEN IS ACCEPTABLE. RESPIRATORY CULTURE REPORT TO FOLLOW.   Report Status 04/03/2015 FINAL  Final  Culture, respiratory (NON-Expectorated)     Status: None (Preliminary result)   Collection Time: 04/03/15  8:50 AM  Result Value Ref Range Status   Specimen Description SPUTUM  Final   Special Requests NONE  Final   Gram Stain   Final    FEW WBC PRESENT,BOTH PMN AND MONONUCLEAR FEW SQUAMOUS EPITHELIAL CELLS PRESENT NO ORGANISMS SEEN Performed at [facility]    Culture   Final    Culture reincubated for better growth Performed at [facility]    Report Status PENDING  Incomplete     Studies: Dg Chest Port 1 View  04/03/2015  CLINICAL DATA:  Congestive heart failure, cough and congestion Pt has has SOB, and is anemic. Hx of having a stroke 3 years ago EXAM: PORTABLE CHEST 1 VIEW COMPARISON:  06/01/2014 FINDINGS: There changes from prior CABG surgery, stable. There is stable mild to moderate cardiomegaly. No mediastinal or hilar masses or convincing adenopathy. Mild stable scarring at the left lung base. Lungs otherwise clear. No  pleural effusion or pneumothorax. Bony thorax is intact. IMPRESSION: No acute cardiopulmonary disease. Electronically Signed   By: Lajean Manes M.D.   On: 04/03/2015 11:57    Scheduled Meds:  Scheduled Meds: . sodium chloride   Intravenous Once  . amiodarone  200 mg Oral Daily  . atorvastatin  10 mg Oral Daily  . dextromethorphan-guaiFENesin  1 tablet Oral BID  . doxycycline  100 mg Oral Q12H  . ferrous Q000111Q C-folic acid  1 capsule Oral Daily  . fluticasone  2 spray Each Nare Daily  . insulin aspart  0-15 Units Subcutaneous TID WC  . insulin aspart  0-5 Units Subcutaneous QHS  . ipratropium-albuterol  3 mL Nebulization QID  . isosorbide mononitrate  30 mg Oral Daily  . lidocaine  1 patch Transdermal Q24H  . lisinopril  10 mg Oral Daily  . montelukast  10 mg Oral QHS  . multivitamin with minerals  1 tablet Oral Daily  . pantoprazole (PROTONIX) IV  40 mg Intravenous Q12H  . predniSONE  40 mg Oral Q breakfast  . sertraline  50 mg Oral Daily  . sodium chloride  3 mL Intravenous Q12H  . tiotropium  18 mcg Inhalation Daily  . torsemide  20 mg Oral BID   Continuous Infusions:   Time spent on care of this patient: 35 min   [place], DO  04/04/2015, 10:16 AM  LOS: 5 days   [facility] Office  [phone] Pager - Text Page per [email or web address] If 7PM-7AM, please contact night-coverage [email or web address]

## 2015-04-04 NOTE — Evaluation (Signed)
Occupational Therapy Evaluation Patient Details Name: Caitlyn Hardy MRN: [id number] DOB: Dec 06, 1945 Today's Date: 04/04/2015    History of Present Illness pt is a 70 y/o female with h/o RLS, CVA, cardiomyopathy, pulm HTN, MI, CA, PNA, admitted with SOB, cough, wheezing.  In ED found to have Hgb of 6.7.  Work up to include upper and lower GI scoping.   Clinical Impression   Pt admitted with above. She demonstrates the below listed deficits and will benefit from continued OT to maximize safety and independence with BADLs.  Pt is able to perform ADLs at supervision level with DOE 2/4.  She independently incorporates energy conservation techniques.  She would benefit from UE HEP for strengthening.  She should progress to mod I with ADLs quickly.       Follow Up Recommendations  No OT follow up;Supervision - Intermittent    Equipment Recommendations  None recommended by OT    Recommendations for Other Services       Precautions / Restrictions Precautions Precautions: Fall      Mobility Bed Mobility Overal bed mobility: Modified Independent                Transfers Overall transfer level: Modified independent                    Balance Overall balance assessment: Needs assistance Sitting-balance support: No upper extremity supported Sitting balance-Leahy Scale: Good     Standing balance support: No upper extremity supported Standing balance-Leahy Scale: Fair                              ADL Overall ADL's : Needs assistance/impaired Eating/Feeding: Independent   Grooming: Wash/dry hands;Oral care;Wash/dry face;Min guard;Standing   Upper Body Bathing: Set up;Sitting   Lower Body Bathing: Sit to/from stand;Supervison/ safety   Upper Body Dressing : Set up;Sitting   Lower Body Dressing: Sit to/from stand;Supervision/safety   Toilet Transfer: Ambulation;Comfort height toilet;BSC;Grab bars;RW;Supervision/safety   Toileting- Clothing  Manipulation and Hygiene: Sit to/from stand;Supervision/safety       Functional mobility during ADLs: Counsellor      Pertinent Vitals/Pain Pain Assessment: No/denies pain Faces Pain Scale: Hurts a little bit Pain Location: vague joint pain Pain Descriptors / Indicators: Discomfort Pain Intervention(s): Monitored during session     Hand Dominance     Extremity/Trunk Assessment Upper Extremity Assessment Upper Extremity Assessment: Generalized weakness   Lower Extremity Assessment Lower Extremity Assessment: Defer to PT evaluation       Communication Communication Communication: No difficulties   Cognition Arousal/Alertness: Awake/alert Behavior During Therapy: WFL for tasks assessed/performed Overall Cognitive Status: Within Functional Limits for tasks assessed                     General Comments       Exercises       Shoulder Instructions      Home Living Family/patient expects to be discharged to:: Private residence Living Arrangements: Alone Available Help at Discharge: Friend(s);Family;Available PRN/intermittently Type of Home: Apartment Home Access: Level entry     Home Layout: One level     Bathroom Shower/Tub: Teacher, early years/pre: Handicapped height     Home Equipment: Environmental consultant - 2 wheels;Walker - 4 wheels;Cane - single point;Bedside commode;Electric scooter;Tub bench  Prior Functioning/Environment Level of Independence: Independent with assistive device(s)        Comments: does not drive     OT Diagnosis: Generalized weakness   OT Problem List: Decreased strength;Decreased activity tolerance   OT Treatment/Interventions: Therapeutic exercise;Patient/family education    OT Goals(Current goals can be found in the care plan section) Acute Rehab OT Goals Patient Stated Goal: to go home  OT Goal Formulation: With patient Time For Goal  Achievement: 04/11/15 Potential to Achieve Goals: Good ADL Goals Pt/caregiver will Perform Home Exercise Program: Increased strength;Right Upper extremity;Left upper extremity;Independently;With written HEP provided;With theraband  OT Frequency: Min 2X/week   Barriers to D/C:            Co-evaluation              End of Session Equipment Utilized During Treatment: Rolling walker;Oxygen Nurse Communication: Mobility status  Activity Tolerance: Patient tolerated treatment well Patient left: in bed;with call bell/phone within reach   Time: 1551-1607 OT Time Calculation (min): 16 min Charges:  OT General Charges $OT Visit: 1 Procedure OT Evaluation $OT Eval Low Complexity: 1 Procedure G-Codes:    Jameel Quant M 04/08/15, 5:03 PM

## 2015-04-05 LAB — CULTURE, RESPIRATORY: CULTURE: NORMAL

## 2015-04-05 LAB — BASIC METABOLIC PANEL
Anion gap: 12 (ref 5–15)
BUN: 48 mg/dL — AB (ref 6–20)
CHLORIDE: 98 mmol/L — AB (ref 101–111)
CO2: 29 mmol/L (ref 22–32)
CREATININE: 1.86 mg/dL — AB (ref 0.44–1.00)
Calcium: 8.5 mg/dL — ABNORMAL LOW (ref 8.9–10.3)
GFR calc non Af Amer: 27 mL/min — ABNORMAL LOW (ref >60)
GFR, EST AFRICAN AMERICAN: 31 mL/min — AB (ref >60)
Glucose, Bld: 96 mg/dL (ref 65–99)
POTASSIUM: 4.2 mmol/L (ref 3.5–5.1)
SODIUM: 139 mmol/L (ref 135–145)

## 2015-04-05 LAB — CBC
HEMATOCRIT: 29.8 % — AB (ref 36.0–46.0)
HEMOGLOBIN: 8.4 g/dL — AB (ref 12.0–15.0)
MCH: 22.5 pg — AB (ref 26.0–34.0)
MCHC: 28.2 g/dL — ABNORMAL LOW (ref 30.0–36.0)
MCV: 79.9 fL (ref 78.0–100.0)
Platelets: 230 10*3/uL (ref 150–400)
RBC: 3.73 MIL/uL — AB (ref 3.87–5.11)
RDW: 18.7 % — ABNORMAL HIGH (ref 11.5–15.5)
WBC: 8.2 10*3/uL (ref 4.0–10.5)

## 2015-04-05 LAB — GLUCOSE, CAPILLARY
GLUCOSE-CAPILLARY: 107 mg/dL — AB (ref 65–99)
GLUCOSE-CAPILLARY: 126 mg/dL — AB (ref 65–99)
GLUCOSE-CAPILLARY: 140 mg/dL — AB (ref 65–99)
Glucose-Capillary: 157 mg/dL — ABNORMAL HIGH (ref 65–99)

## 2015-04-05 LAB — CULTURE, RESPIRATORY W GRAM STAIN

## 2015-04-05 MED ORDER — SODIUM CHLORIDE 0.9 % IV SOLN
INTRAVENOUS | Status: DC
  Administered 2015-04-05: 14:00:00 via INTRAVENOUS

## 2015-04-05 NOTE — Progress Notes (Signed)
Patient has home CPAP, tubing and mask. Places self on and off. RT made pt aware that she can call if she needs any help.

## 2015-04-05 NOTE — Progress Notes (Signed)
[facility] Progress Note   Caitlyn Hardy  [id number]  DOB: Dec 07, 1945  DOA: 03/30/2015 PCP: Caitlyn Bicker, MD  Brief narrative: Caitlyn Hardy is a 70 y.o. female with COPD on 2 L of oxygen at home, CAD status post CABG, systolic heart failure with an EF of 35-40%, chronic kidney disease stage III, hypertension, hyperlipidemia, diabetes mellitus type 2, history of CVA, atrial fibrillation, pulmonary hypertension and obesity. Patient presents for shortness of breath which has been steadily progressing for the past 2 weeks. It is at the point where she can barely move around without getting short of breath. She has been coughing up greenish colored mucus. Chest x-ray performed in the ER at [facility] revealed congestive heart failure. Also, found to be quite anemic with Hb of 6.   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  Assessment/Plan:   Acute on chronic respiratory failure (HCC) -Due to acute bronchitis and CHF exacerbation- improving - wean O2 as able- wears 2-3 at home   Microcytic anemia -GI following-further workup planned for when respiratory status improves -Received 2 units of packed red blood cells-continue to follow hemoglobin to see if she needs further transfusions -Anemia panel suggest AOCD with low normal ferretin and B12 level-  - LDH slightly elevated but no schistocytes on smear - POC HEME POSITIVE - records from [place] regarding prior endosdopies in 2015 in chart reveal she had polyps in the past - Pradaxa on hold -EGD in AM   Acute on chronic systolic heart failure  - neg balance by 11.4 L- repeat CXR 1/3 show no disease --has been diuresing well with IV Lasix 60 BID-will switch to oral Demadex now -  may need to increase Demadex home dose on discharge -weight decreasing  Acute bronchitis/ AECOPD - cont Doxycyline, Nebs, dextro/mucinex, Singulair, Spiriva - weaning steroids on PO prednisone - added  flutter valve  Hyperkalemia - occurring after blood transfusion and likely secondary to it ?resume lisinopril   h/o  Stroke  -may have been due to PAF    Paroxysmal atrial fibrillation - hold Pradaxa due to anemia- currently NSR -cont Amiodarone    Sleep apnea -  CPAP    Diabetes mellitus  - cont sliding scale     CKD (chronic kidney disease), stage 3-4 -at baseline-  follow    Antibiotics: Anti-infectives    Start     Dose/Rate Route Frequency Ordered Stop   03/30/15 1000  doxycycline (VIBRA-TABS) tablet 100 mg     100 mg Oral Every 12 hours 03/30/15 0420       Code Status:     Code Status Orders        Start     Ordered   03/30/15 0417  Full code   Continuous     03/30/15 0418     Family Communication: none Disposition Plan: awaiting GI work up to determine if anticoagulation can be resumed DVT prophylaxis: SCDs Consultants:  GI Procedures:  none    Objective: Filed Weights   04/03/15 0519 04/04/15 0534 04/05/15 0552  Weight: 128.05 kg (282 lb 4.8 oz) 127.93 kg (282 lb 0.6 oz) 126.78 kg (279 lb 8 oz)    Intake/Output Summary (Last 24 hours) at 04/05/15 1355 Last data filed at 04/05/15 1344  Gross per 24 hour  Intake   1080 ml  Output   1200 ml  Net   -120 ml     Vitals Filed Vitals:   04/04/15 2008 04/04/15 2044  04/05/15 0552 04/05/15 0953  BP: 104/38  128/42 109/67  Pulse: 70  71   Temp: 98.1 F (36.7 C)  98 F (36.7 C)   TempSrc: Oral  Oral   Resp: 20  20   Height:      Weight:   126.78 kg (279 lb 8 oz)   SpO2: 95% 96% 93%     Exam:  General:  Pt is alert, not in acute distress  Cardiovascular: regular rate and rhythm, S1/S2 No murmur  Respiratory: expiratory wheeze, large body habitus  Abdomen: Soft, +Bowel sounds, non tender, non distended, no guarding  MSK: No cyanosis or clubbing- no pedal edema   Data Reviewed: Basic Metabolic Panel:  Recent Labs Lab 03/31/15 0501 04/01/15 0233 04/02/15 0417 04/03/15 0411  04/05/15 0345  NA 140 141 140 139 139  K 4.8 5.4* 5.0 3.8 4.2  CL 105 102 100* 96* 98*  CO2 26 30 32 31 29  GLUCOSE 160* 163* 144* 89 96  BUN 31* 40* 43* 45* 48*  CREATININE 1.65* 1.92* 1.80* 1.79* 1.86*  CALCIUM 8.6* 9.0 8.8* 9.1 8.5*   Liver Function Tests: No results for input(s): AST, ALT, ALKPHOS, BILITOT, PROT, ALBUMIN in the last 168 hours. No results for input(s): LIPASE, AMYLASE in the last 168 hours. No results for input(s): AMMONIA in the last 168 hours. CBC:  Recent Labs Lab 03/31/15 1145 03/31/15 1625 04/01/15 0738 04/02/15 0417 04/03/15 0411 04/05/15 0345  WBC 10.0 8.4  --  9.0 9.8 8.2  HGB 7.4* 6.9* 7.6* 7.5* 9.7* 8.4*  HCT 26.0* 23.7* 25.7* 25.8* 33.2* 29.8*  MCV 80.0 80.1  --  78.9 80.0 79.9  PLT 251 213  --  232 262 230   Cardiac Enzymes:  Recent Labs Lab 03/30/15 1040 03/30/15 1939  TROPONINI <0.03 0.03   BNP (last 3 results) No results for input(s): BNP in the last 8760 hours.  ProBNP (last 3 results) No results for input(s): PROBNP in the last 8760 hours.  CBG:  Recent Labs Lab 04/04/15 1135 04/04/15 1617 04/04/15 2123 04/05/15 0555 04/05/15 1149  GLUCAP 142* 284* 91 107* 126*    Recent Results (from the past 240 hour(s))  MRSA PCR Screening     Status: Abnormal   Collection Time: 03/30/15  3:00 AM  Result Value Ref Range Status   MRSA by PCR POSITIVE (A) NEGATIVE Final    Comment:        The GeneXpert MRSA Assay (FDA approved for NASAL specimens only), is one component of a comprehensive MRSA colonization surveillance program. It is not intended to diagnose MRSA infection nor to guide or monitor treatment for MRSA infections. RESULT CALLED TO, READ BACK BY AND VERIFIED WITH: A BUENDIA @0450  03/30/15 MKELLY   Culture, blood (routine x 2) Call MD if unable to obtain prior to antibiotics being given     Status: None   Collection Time: 03/30/15 10:40 AM  Result Value Ref Range Status   Specimen Description BLOOD LEFT ARM   Final   Special Requests BOTTLES DRAWN AEROBIC ONLY 5CC  Final   Culture NO GROWTH 5 DAYS  Final   Report Status 04/04/2015 FINAL  Final  Culture, blood (routine x 2) Call MD if unable to obtain prior to antibiotics being given     Status: None   Collection Time: 03/30/15 10:50 AM  Result Value Ref Range Status   Specimen Description BLOOD LEFT ANTECUBITAL  Final   Special Requests BOTTLES DRAWN AEROBIC ONLY 5CC  Final   Culture NO GROWTH 5 DAYS  Final   Report Status 04/04/2015 FINAL  Final  Culture, sputum-assessment     Status: None   Collection Time: 04/03/15  8:50 AM  Result Value Ref Range Status   Specimen Description SPUTUM  Final   Special Requests NONE  Final   Sputum evaluation   Final    THIS SPECIMEN IS ACCEPTABLE. RESPIRATORY CULTURE REPORT TO FOLLOW.   Report Status 04/03/2015 FINAL  Final  Culture, respiratory (NON-Expectorated)     Status: None   Collection Time: 04/03/15  8:50 AM  Result Value Ref Range Status   Specimen Description SPUTUM  Final   Special Requests NONE  Final   Gram Stain   Final    FEW WBC PRESENT,BOTH PMN AND MONONUCLEAR FEW SQUAMOUS EPITHELIAL CELLS PRESENT NO ORGANISMS SEEN Performed at [facility]    Culture   Final    NORMAL OROPHARYNGEAL FLORA Performed at [facility]    Report Status 04/05/2015 FINAL  Final     Studies: No results found.  Scheduled Meds:  Scheduled Meds: . sodium chloride   Intravenous Once  . amiodarone  200 mg Oral Daily  . atorvastatin  10 mg Oral Daily  . dextromethorphan-guaiFENesin  1 tablet Oral BID  . doxycycline  100 mg Oral Q12H  . ferrous Q000111Q C-folic acid  1 capsule Oral Daily  . fluticasone  2 spray Each Nare Daily  . insulin aspart  0-15 Units Subcutaneous TID WC  . insulin aspart  0-5 Units Subcutaneous QHS  . ipratropium-albuterol  3 mL Nebulization QID  . isosorbide mononitrate  30 mg Oral Daily  . lidocaine  1 patch Transdermal Q24H  . lisinopril  10  mg Oral Daily  . montelukast  10 mg Oral QHS  . multivitamin with minerals  1 tablet Oral Daily  . pantoprazole  40 mg Oral BID AC  . predniSONE  40 mg Oral Q breakfast  . sertraline  50 mg Oral Daily  . sodium chloride  3 mL Intravenous Q12H  . tiotropium  18 mcg Inhalation Daily  . torsemide  20 mg Oral BID   Continuous Infusions: . sodium chloride      Time spent on care of this patient: 35 min   [place], DO  04/05/2015, 1:55 PM  LOS: 6 days   [facility] Office  [phone] Pager - Text Page per [email or web address] If 7PM-7AM, please contact night-coverage [email or web address]

## 2015-04-05 NOTE — Progress Notes (Signed)
Informed that patient had a heme positive stool and dropped her hemoglobin over a gram since yesterday. We'll go ahead and proceed with EGD tomorrow.

## 2015-04-05 NOTE — Progress Notes (Signed)
Occupational Therapy Treatment Patient Details Name: Caitlyn Hardy MRN: [id number] DOB: [birth year] Today's Date: 04/05/2015    History of present illness pt is a 70 y/o female with h/o RLS, CVA, cardiomyopathy, pulm HTN, MI, CA, PNA, admitted with SOB, cough, wheezing.  In ED found to have Hgb of 6.7.  Work up to include upper and lower GI scoping.   OT comments  Patient is progressing towards OT goal of BUE HEP, continue plan of care for now. Therapist brought in handout and level 1, orange theraband. Pt went through each exercise 1 time (10 reps) and then a second time for therapist to make sure she could read the instructions on the handout and that she was able to perform each exercise with correct body mechanics and technique.   Follow Up Recommendations  No OT follow up;Supervision - Intermittent    Equipment Recommendations  None recommended by OT    Recommendations for Other Services  None at this time   Precautions / Restrictions Precautions Precautions: Fall Restrictions Weight Bearing Restrictions: No       Balance Overall balance assessment: Needs assistance Sitting-balance support: No upper extremity supported;Feet supported Sitting balance-Leahy Scale: Normal Sitting balance - Comments: during BUE strengthening HEP using theraband    ADL General ADL Comments: Focused this OT session on BUE strengthening HEP using level 1 theraband     Cognition   Behavior During Therapy: WFL for tasks assessed/performed Overall Cognitive Status: Within Functional Limits for tasks assessed Memory: Decreased short-term memory (some slow processing noted during HEP education)      Exercises General Exercises - Upper Extremity Shoulder ABduction: Strengthening;Both;10 reps;Seated;Theraband Theraband Level (Shoulder Abduction): Level 1 (Yellow) Shoulder ADduction: Strengthening;Both;10 reps;Seated;Theraband Theraband Level (Shoulder Adduction): Level 1 (Yellow) Shoulder  Horizontal ABduction: Strengthening;Both;10 reps;Seated;Theraband Theraband Level (Shoulder Horizontal Abduction): Level 1 (Yellow) Shoulder Exercises Shoulder Flexion: Strengthening;Both;10 reps;Seated;Theraband Theraband Level (Shoulder Flexion): Level 1 (Yellow) Elbow Flexion: Strengthening;Both;10 reps;Seated;Theraband Theraband Level (Elbow Flexion): Level 1 (Yellow) Elbow Extension: Strengthening;Both;10 reps;Seated;Theraband Theraband Level (Elbow Extension): Level 1 (Yellow)           Pertinent Vitals/ Pain       Pain Assessment: No/denies pain   Frequency Min 2X/week     Progress Toward Goals  OT Goals(current goals can now befound in the care plan section)  Progress towards OT goals: Progressing toward goals  Acute Rehab OT Goals Patient Stated Goal: get stronger and go home  Plan Discharge plan remains appropriate    End of Session Equipment Utilized During Treatment: Oxygen   Activity Tolerance Patient tolerated treatment well   Patient Left in bed;with call bell/phone within reach (seated EOB)    Time: [id number] OT Time Calculation (min): 22 min  Charges: OT Treatments $Therapeutic Exercise: 8-22 mins  Chrys Racer , MS, OTR/L, CLT Pager: [phone]  04/05/2015, 10:41 AM

## 2015-04-06 ENCOUNTER — Inpatient Hospital Stay (HOSPITAL_COMMUNITY): Payer: Medicare Other | Admitting: Anesthesiology

## 2015-04-06 ENCOUNTER — Encounter (HOSPITAL_COMMUNITY)
Admission: AD | Disposition: A | Discharge status: Home Health | Payer: Self-pay | Source: Other Acute Inpatient Hospital | Attending: Internal Medicine

## 2015-04-06 ENCOUNTER — Encounter (HOSPITAL_COMMUNITY): Payer: Self-pay | Admitting: *Deleted

## 2015-04-06 LAB — GLUCOSE, CAPILLARY
Glucose-Capillary: 108 mg/dL — ABNORMAL HIGH (ref 65–99)
Glucose-Capillary: 147 mg/dL — ABNORMAL HIGH (ref 65–99)
Glucose-Capillary: 100 mg/dL — ABNORMAL HIGH (ref 65–99)

## 2015-04-06 SURGERY — CANCELLED PROCEDURE
Anesthesia: Monitor Anesthesia Care

## 2015-04-06 MED ORDER — PREDNISONE 20 MG PO TABS
40.0000 mg | ORAL_TABLET | Freq: Every day | ORAL | Status: DC
  Administered 2015-04-06 – 2015-04-10 (×5): 40 mg via ORAL
  Filled 2015-04-06 (×5): qty 2

## 2015-04-06 NOTE — Progress Notes (Signed)
Pt in endoscopic procedure.  Will try later as time allows.  Mee Hives, PT MS Acute Rehab Dept. Number: [phone] [id number] and [place] [phone]

## 2015-04-06 NOTE — Progress Notes (Signed)
Pt does not currently have an IV access. Multiple IV- start attempts by IV team in endo. MD notified.

## 2015-04-06 NOTE — Progress Notes (Signed)
[facility] Progress Note   [name]  [id number]  DOB: [birth year]  DOA: 03/30/2015 PCP: [name], MD  Brief narrative: Caitlyn Hardy is a 70 y.o. female with COPD on 2 L of oxygen at home, CAD status post CABG, systolic heart failure with an EF of 35-40%, chronic kidney disease stage III, hypertension, hyperlipidemia, diabetes mellitus type 2, history of CVA, atrial fibrillation, pulmonary hypertension and obesity. Patient presents for shortness of breath which has been steadily progressing for the past 2 weeks. It is at the point where she can barely move around without getting short of breath. She has been coughing up greenish colored mucus. Chest x-ray performed in the ER at [facility] revealed congestive heart failure. Also, found to be quite anemic with Hb of 6.   Subjective: breathing better  Assessment/Plan:   Acute on chronic respiratory failure (HCC) -Due to acute bronchitis and CHF exacerbation- improving - wean O2 as able- wears 2-3 at home   Microcytic anemia -GI following-further workup planned for when respiratory status improves -Received 2 units of packed red blood cells-continue to follow hemoglobin to see if she needs further transfusions -Anemia panel suggest AOCD with low normal ferretin and B12 level-  - LDH slightly elevated but no schistocytes on smear - POC HEME POSITIVE - records from [facility] regarding prior endosdopies in 2015 in chart reveal she had polyps in the past - Pradaxa on hold -EGD today   Acute on chronic systolic heart failure  - neg balance by 15 L- repeat CXR 1/3 show no disease --has been diuresing well with IV Lasix 60 BID-will switch to oral Demadex now -  may need to increase Demadex home dose on discharge -weight decreasing  Acute bronchitis/ AECOPD - cont Doxycyline, Nebs, dextro/mucinex, Singulair, Spiriva - weaning steroids on PO prednisone - added flutter valve  Hyperkalemia - occurring  after blood transfusion and likely secondary to it ?resume lisinopril   h/o  Stroke  -may have been due to PAF    Paroxysmal atrial fibrillation - hold Pradaxa due to anemia- currently NSR -cont Amiodarone    Sleep apnea -  CPAP    Diabetes mellitus  - cont sliding scale     CKD (chronic kidney disease), stage 3-4 -at baseline-  follow    Antibiotics: Anti-infectives    Start     Dose/Rate Route Frequency Ordered Stop   03/30/15 1000  doxycycline (VIBRA-TABS) tablet 100 mg     100 mg Oral Every 12 hours 03/30/15 0420       Code Status:     Code Status Orders        Start     Ordered   03/30/15 0417  Full code   Continuous     03/30/15 0418     Family Communication: none Disposition Plan: awaiting GI work up to determine if anticoagulation can be resumed DVT prophylaxis: SCDs Consultants:  GI Procedures:  none    Objective: Filed Weights   04/03/15 0519 04/04/15 0534 04/05/15 0552  Weight: 128.05 kg (282 lb 4.8 oz) 127.93 kg (282 lb 0.6 oz) 126.78 kg (279 lb 8 oz)    Intake/Output Summary (Last 24 hours) at 04/06/15 1006 Last data filed at 04/06/15 0850  Gross per 24 hour  Intake    480 ml  Output   2750 ml  Net  -2270 ml     Vitals Filed Vitals:   04/05/15 2028 04/05/15 2132 04/06/15 0449 04/06/15 1000  BP:  116/50 106/53  111/42  Pulse:  67 59 61  Temp:  98.2 F (36.8 C) 97.8 F (36.6 C) 98.7 F (37.1 C)  TempSrc:  Oral Oral Oral  Resp:  18 18   Height:      Weight:      SpO2: 100% 98% 97% 97%    Exam:  General:  Pt is alert, not in acute distress  Cardiovascular: regular rate and rhythm, S1/S2 No murmur  Respiratory: diminished due to large body habitus, no wheezing but on breathing treatment  Abdomen: Soft, +Bowel sounds, non tender, non distended, no guarding  MSK: No cyanosis or clubbing- no pedal edema   Data Reviewed: Basic Metabolic Panel:  Recent Labs Lab 03/31/15 0501 04/01/15 0233 04/02/15 0417 04/03/15 0411  04/05/15 0345  NA 140 141 140 139 139  K 4.8 5.4* 5.0 3.8 4.2  CL 105 102 100* 96* 98*  CO2 26 30 32 31 29  GLUCOSE 160* 163* 144* 89 96  BUN 31* 40* 43* 45* 48*  CREATININE 1.65* 1.92* 1.80* 1.79* 1.86*  CALCIUM 8.6* 9.0 8.8* 9.1 8.5*   Liver Function Tests: No results for input(s): AST, ALT, ALKPHOS, BILITOT, PROT, ALBUMIN in the last 168 hours. No results for input(s): LIPASE, AMYLASE in the last 168 hours. No results for input(s): AMMONIA in the last 168 hours. CBC:  Recent Labs Lab 03/31/15 1145 03/31/15 1625 04/01/15 0738 04/02/15 0417 04/03/15 0411 04/05/15 0345  WBC 10.0 8.4  --  9.0 9.8 8.2  HGB 7.4* 6.9* 7.6* 7.5* 9.7* 8.4*  HCT 26.0* 23.7* 25.7* 25.8* 33.2* 29.8*  MCV 80.0 80.1  --  78.9 80.0 79.9  PLT 251 213  --  232 262 230   Cardiac Enzymes:  Recent Labs Lab 03/30/15 1040 03/30/15 1939  TROPONINI <0.03 0.03   BNP (last 3 results) No results for input(s): BNP in the last 8760 hours.  ProBNP (last 3 results) No results for input(s): PROBNP in the last 8760 hours.  CBG:  Recent Labs Lab 04/05/15 0555 04/05/15 1149 04/05/15 1658 04/05/15 2131 04/06/15 0629  GLUCAP 107* 126* 157* 140* 108*    Recent Results (from the past 240 hour(s))  MRSA PCR Screening     Status: Abnormal   Collection Time: 03/30/15  3:00 AM  Result Value Ref Range Status   MRSA by PCR POSITIVE (A) NEGATIVE Final    Comment:        The GeneXpert MRSA Assay (FDA approved for NASAL specimens only), is one component of a comprehensive MRSA colonization surveillance program. It is not intended to diagnose MRSA infection nor to guide or monitor treatment for MRSA infections. RESULT CALLED TO, READ BACK BY AND VERIFIED WITH: A BUENDIA @0450  03/30/15 MKELLY   Culture, blood (routine x 2) Call MD if unable to obtain prior to antibiotics being given     Status: None   Collection Time: 03/30/15 10:40 AM  Result Value Ref Range Status   Specimen Description BLOOD LEFT  ARM  Final   Special Requests BOTTLES DRAWN AEROBIC ONLY 5CC  Final   Culture NO GROWTH 5 DAYS  Final   Report Status 04/04/2015 FINAL  Final  Culture, blood (routine x 2) Call MD if unable to obtain prior to antibiotics being given     Status: None   Collection Time: 03/30/15 10:50 AM  Result Value Ref Range Status   Specimen Description BLOOD LEFT ANTECUBITAL  Final   Special Requests BOTTLES DRAWN AEROBIC ONLY 5CC  Final   Culture  NO GROWTH 5 DAYS  Final   Report Status 04/04/2015 FINAL  Final  Culture, sputum-assessment     Status: None   Collection Time: 04/03/15  8:50 AM  Result Value Ref Range Status   Specimen Description SPUTUM  Final   Special Requests NONE  Final   Sputum evaluation   Final    THIS SPECIMEN IS ACCEPTABLE. RESPIRATORY CULTURE REPORT TO FOLLOW.   Report Status 04/03/2015 FINAL  Final  Culture, respiratory (NON-Expectorated)     Status: None   Collection Time: 04/03/15  8:50 AM  Result Value Ref Range Status   Specimen Description SPUTUM  Final   Special Requests NONE  Final   Gram Stain   Final    FEW WBC PRESENT,BOTH PMN AND MONONUCLEAR FEW SQUAMOUS EPITHELIAL CELLS PRESENT NO ORGANISMS SEEN Performed at [facility]    Culture   Final    NORMAL OROPHARYNGEAL FLORA Performed at [facility]    Report Status 04/05/2015 FINAL  Final     Studies: No results found.  Scheduled Meds:  Scheduled Meds: . sodium chloride   Intravenous Once  . amiodarone  200 mg Oral Daily  . atorvastatin  10 mg Oral Daily  . dextromethorphan-guaiFENesin  1 tablet Oral BID  . doxycycline  100 mg Oral Q12H  . ferrous [id number] C-folic acid  1 capsule Oral Daily  . fluticasone  2 spray Each Nare Daily  . insulin aspart  0-15 Units Subcutaneous TID WC  . insulin aspart  0-5 Units Subcutaneous QHS  . ipratropium-albuterol  3 mL Nebulization QID  . isosorbide mononitrate  30 mg Oral Daily  . lidocaine  1 patch Transdermal Q24H  . lisinopril   10 mg Oral Daily  . montelukast  10 mg Oral QHS  . multivitamin with minerals  1 tablet Oral Daily  . pantoprazole  40 mg Oral BID AC  . predniSONE  40 mg Oral Q breakfast  . sertraline  50 mg Oral Daily  . sodium chloride  3 mL Intravenous Q12H  . tiotropium  18 mcg Inhalation Daily  . torsemide  20 mg Oral BID   Continuous Infusions: . sodium chloride 20 mL/hr at 04/05/15 1400    Time spent on care of this patient: 35 min   [place], DO  04/06/2015, 10:06 AM  LOS: 7 days   [facility] Office  [phone] Pager - Text Page per [email or web address] If 7PM-7AM, please contact night-coverage [email or web address]

## 2015-04-06 NOTE — Care Management Note (Signed)
Case Management Note  Patient Details  Name: Quetzalli Lafferty MRN: [id number] Date of Birth: 01/02/1946  Subjective/Objective:        Admitted with Acute Respiratory Failure            Action/Plan: Patient lives alone with son near by to assist as needed. Patient stated that she was walking one mile several days a week prior to coming in the hospital but now she is weak and deconditioned. Patient requested [facility]. Referral placed. Lives in [place]. Reports no problem getting her medication. Attending MD at discharge please enter the face to face for [facility] services per Medicare guidelines in Epic.  Expected Discharge Date:    possibly 04/10/2015               Expected Discharge Plan:  [place]  Discharge planning Services  CM Consult Choice offered to:  Patient  HH Arranged:  PT HH Agency:   [facility] in [place]  Status of Service:  In process, will continue to follow  Sherrilyn Rist [phone] 04/06/2015, 1:08 PM

## 2015-04-06 NOTE — Progress Notes (Signed)
IV team unable to get access Unable to get PICC due to CKD -asked PCCM for central line-- will place on list  Eulogio Bear DO

## 2015-04-06 NOTE — Anesthesia Preprocedure Evaluation (Addendum)
Anesthesia Evaluation  Patient identified by MRN, date of birth, ID band Patient awake    Reviewed: Allergy & Precautions, NPO status , Patient's Chart, lab work & pertinent test results, reviewed documented beta blocker date and time   History of Anesthesia Complications (+) history of anesthetic complications  Airway Mallampati: IV  TM Distance: <3 FB Neck ROM: Full  Mouth opening: Limited Mouth Opening  Dental  (+) Edentulous Upper, Edentulous Lower   Pulmonary shortness of breath, asthma , sleep apnea , pneumonia, COPD, former smoker,    breath sounds clear to auscultation       Cardiovascular hypertension, Pt. on medications and Pt. on home beta blockers + angina + CAD, + Past MI, + CABG and +CHF   Rhythm:Irregular Rate:Normal     Neuro/Psych  Headaches, PSYCHIATRIC DISORDERS Depression CVA    GI/Hepatic hiatal hernia,   Endo/Other  diabetes, Type 2Morbid obesity  Renal/GU Renal disease     Musculoskeletal  (+) Arthritis ,   Abdominal (+) + obese,   Peds  Hematology  (+) anemia ,   Anesthesia Other Findings   Reproductive/Obstetrics                            Anesthesia Physical  Anesthesia Plan  ASA: III  Anesthesia Plan: MAC   Post-op Pain Management:    Induction: Intravenous  Airway Management Planned: Simple Face Mask  Additional Equipment:   Intra-op Plan:   Post-operative Plan:   Informed Consent: I have reviewed the patients History and Physical, chart, labs and discussed the procedure including the risks, benefits and alternatives for the proposed anesthesia with the patient or authorized representative who has indicated his/her understanding and acceptance.     Plan Discussed with: CRNA  Anesthesia Plan Comments:        Anesthesia Quick Evaluation

## 2015-04-06 NOTE — Progress Notes (Signed)
Pt has home CPAP machine and will placed on self when ready.  Pt instructed to notify RT if assistance is needed.

## 2015-04-06 NOTE — Transfer of Care (Signed)
Immediate Anesthesia Transfer of Care Note  Patient: Caitlyn Hardy  Procedure(s) Performed: Procedure(s): ESOPHAGOGASTRODUODENOSCOPY (EGD) (N/A)  Patient Location: Endoscopy Unit  Anesthesia Type:MAC  Level of Consciousness: awake, alert  and oriented  Airway & Oxygen Therapy: Patient Spontanous Breathing and Patient connected to nasal cannula oxygen  Post-op Assessment: Report given to RN and Post -op Vital signs reviewed and stable  Post vital signs: Reviewed and stable  Last Vitals:  Filed Vitals:   04/06/15 1000 04/06/15 1043  BP: 111/42 130/46  Pulse: 61   Temp: 37.1 C 36.8 C  Resp:  14    Complications: No apparent anesthesia complications

## 2015-04-06 NOTE — Progress Notes (Signed)
No DME needed at this time, patient has rollater (rolling walker with seat) lift chair and cane. Mindi Slicker [facility] [phone]

## 2015-04-06 NOTE — Progress Notes (Signed)
[facility] Progress Note  Subjective: Patient blunting of moderate dyspnea. No stool since midnight  Objective: Vital signs in last 24 hours: Temp:  [97.8 F (36.6 C)-98.7 F (37.1 C)] 98.3 F (36.8 C) (01/06 1043) Pulse Rate:  [59-67] 61 (01/06 1000) Resp:  [14-18] 14 (01/06 1043) BP: (106-140)/(42-56) 130/46 mmHg (01/06 1043) SpO2:  [97 %-100 %] 99 % (01/06 1043) Weight change:    PE: Unchanged  Lab Results: Results for orders placed or performed during the hospital encounter of 03/30/15 (from the past 24 hour(s))  Glucose, capillary     Status: Abnormal   Collection Time: 04/05/15  4:58 PM  Result Value Ref Range   Glucose-Capillary 157 (H) 65 - 99 mg/dL  Glucose, capillary     Status: Abnormal   Collection Time: 04/05/15  9:31 PM  Result Value Ref Range   Glucose-Capillary 140 (H) 65 - 99 mg/dL   Comment 1 Notify RN    Comment 2 Document in Chart   Glucose, capillary     Status: Abnormal   Collection Time: 04/06/15  6:29 AM  Result Value Ref Range   Glucose-Capillary 108 (H) 65 - 99 mg/dL   Comment 1 Notify RN    Comment 2 Document in Chart     Studies/Results: No results found.    Assessment: Anemia and heme positive stool, fairly unrevealing EGD and colonoscopy 2 years ago but with persistent low and are currently dropping hemoglobin.  Plan: EGD was to be attempted today but multiple IV attempts were unsuccessful. Sided to abort the procedure and continue monitoring stools and hemoglobin lower regular diet. Would consider PICC line, especially if she has other needs for IV access. Will readdress need for endoscopy in a day or 2 and could proceed on Monday.    Edmon Magid C 04/06/2015, 12:01 PM  Pager [phone] If no answer or after 5 PM call [phone]

## 2015-04-07 DIAGNOSIS — D509 Iron deficiency anemia, unspecified: Secondary | ICD-10-CM

## 2015-04-07 DIAGNOSIS — I272 Other secondary pulmonary hypertension: Secondary | ICD-10-CM

## 2015-04-07 DIAGNOSIS — J441 Chronic obstructive pulmonary disease with (acute) exacerbation: Secondary | ICD-10-CM

## 2015-04-07 DIAGNOSIS — J9621 Acute and chronic respiratory failure with hypoxia: Secondary | ICD-10-CM

## 2015-04-07 DIAGNOSIS — I25810 Atherosclerosis of coronary artery bypass graft(s) without angina pectoris: Secondary | ICD-10-CM

## 2015-04-07 DIAGNOSIS — I5023 Acute on chronic systolic (congestive) heart failure: Secondary | ICD-10-CM

## 2015-04-07 DIAGNOSIS — N183 Chronic kidney disease, stage 3 (moderate): Secondary | ICD-10-CM

## 2015-04-07 DIAGNOSIS — G4730 Sleep apnea, unspecified: Secondary | ICD-10-CM

## 2015-04-07 DIAGNOSIS — I5189 Other ill-defined heart diseases: Secondary | ICD-10-CM

## 2015-04-07 DIAGNOSIS — I480 Paroxysmal atrial fibrillation: Secondary | ICD-10-CM

## 2015-04-07 DIAGNOSIS — F329 Major depressive disorder, single episode, unspecified: Secondary | ICD-10-CM

## 2015-04-07 LAB — GLUCOSE, CAPILLARY
GLUCOSE-CAPILLARY: 223 mg/dL — AB (ref 65–99)
Glucose-Capillary: 97 mg/dL (ref 65–99)
Glucose-Capillary: 126 mg/dL — ABNORMAL HIGH (ref 65–99)
Glucose-Capillary: 157 mg/dL — ABNORMAL HIGH (ref 65–99)

## 2015-04-07 LAB — CBC WITH DIFFERENTIAL/PLATELET
Basophils Absolute: 0 10*3/uL (ref 0.0–0.1)
Basophils Relative: 0 %
Eosinophils Absolute: 0.1 10*3/uL (ref 0.0–0.7)
Eosinophils Relative: 1 %
HEMATOCRIT: 30.5 % — AB (ref 36.0–46.0)
Hemoglobin: 8.6 g/dL — ABNORMAL LOW (ref 12.0–15.0)
LYMPHS PCT: 21 %
Lymphs Abs: 1.9 10*3/uL (ref 0.7–4.0)
MCH: 22.6 pg — AB (ref 26.0–34.0)
MCHC: 28.2 g/dL — AB (ref 30.0–36.0)
MCV: 80.1 fL (ref 78.0–100.0)
MONO ABS: 1 10*3/uL (ref 0.1–1.0)
MONOS PCT: 11 %
NEUTROS ABS: 6.1 10*3/uL (ref 1.7–7.7)
Neutrophils Relative %: 67 %
Platelets: 221 10*3/uL (ref 150–400)
RBC: 3.81 MIL/uL — ABNORMAL LOW (ref 3.87–5.11)
RDW: 19 % — AB (ref 11.5–15.5)
WBC: 9 10*3/uL (ref 4.0–10.5)

## 2015-04-07 LAB — BASIC METABOLIC PANEL
ANION GAP: 10 (ref 5–15)
BUN: 42 mg/dL — ABNORMAL HIGH (ref 6–20)
CO2: 30 mmol/L (ref 22–32)
Calcium: 8.7 mg/dL — ABNORMAL LOW (ref 8.9–10.3)
Chloride: 97 mmol/L — ABNORMAL LOW (ref 101–111)
Creatinine, Ser: 1.74 mg/dL — ABNORMAL HIGH (ref 0.44–1.00)
GFR calc Af Amer: 33 mL/min — ABNORMAL LOW (ref >60)
GFR calc non Af Amer: 29 mL/min — ABNORMAL LOW (ref >60)
GLUCOSE: 114 mg/dL — AB (ref 65–99)
POTASSIUM: 4.4 mmol/L (ref 3.5–5.1)
Sodium: 137 mmol/L (ref 135–145)

## 2015-04-07 MED ORDER — GUAIFENESIN-DM 100-10 MG/5ML PO SYRP
5.0000 mL | ORAL_SOLUTION | ORAL | Status: DC | PRN
  Administered 2015-04-07 – 2015-04-09 (×2): 5 mL via ORAL
  Filled 2015-04-07 (×3): qty 5

## 2015-04-07 NOTE — Progress Notes (Signed)
[facility] Progress Note   [name]  [id number]  DOB: Sep 13, 1945  DOA: 03/30/2015 PCP: [name], MD  Brief narrative: Caitlyn Hardy is a 70 y.o. female with COPD on 2 L of oxygen at home, CAD status post CABG, systolic heart failure with an EF of 35-40%, chronic kidney disease stage III, hypertension, hyperlipidemia, diabetes mellitus type 2, history of CVA, atrial fibrillation, pulmonary hypertension and obesity. Patient presents for shortness of breath which has been steadily progressing for the past 2 weeks. It is at the point where she can barely move around without getting short of breath. She has been coughing up greenish colored mucus. Chest x-ray performed in the ER at [facility] revealed congestive heart failure. Also, found to be quite anemic with Hb of 6.   Subjective: Seen sitting at bedside, denies any new complaints. Denies any fever or chills, breathing improved since yesterday.  Assessment/Plan:  Acute on chronic respiratory failure (HCC) with hypoxia -Due to acute bronchitis and CHF exacerbation- improving -Wean oxygen as tolerated, patient on 2-3 L of oxygen at home. -Continue diuresis and treatment for acute bronchitis.   Microcytic anemia -GI following-further workup planned for when respiratory status improves -Received 2 units of packed red blood cells-continue to follow hemoglobin to see if she needs further transfusions -Anemia panel suggest AOCD with low normal ferretin and B12 level-  - LDH slightly elevated but no schistocytes on smear - POC HEME POSITIVE - records from [place] regarding prior endosdopies in 2015 in chart reveal she had polyps in the past - Pradaxa on hold -EGD per GI   Acute on chronic systolic heart failure  - neg balance by 15 L- repeat CXR 1/3 show no disease --has been diuresing well with IV Lasix 60 , switched to 20 mg of Demadex twice a day. -Follow weight and intake/output closely.  Acute  bronchitis/ AECOPD - cont Doxycyline, Nebs, dextro/mucinex, Singulair, Spiriva - weaning steroids on PO prednisone - added flutter valve  Hyperkalemia - occurring after blood transfusion and likely secondary to it ?resume lisinopril   h/o  Stroke  -may have been due to PAF    Paroxysmal atrial fibrillation -Currently normal sinus rhythm, on PRADAXA on hold because of anemia.. -Cont Amiodarone -CHA2DS2-VASc score is at least 5 for female, CHF, HTN and 2 points for history of stroke.    Sleep apnea -  CPAP    Diabetes mellitus  - cont sliding scale     CKD (chronic kidney disease), stage 3-4 -at baseline-  follow    Antibiotics: Anti-infectives    Start     Dose/Rate Route Frequency Ordered Stop   03/30/15 1000  doxycycline (VIBRA-TABS) tablet 100 mg  Status:  Discontinued     100 mg Oral Every 12 hours 03/30/15 0420 04/06/15 1009     Code Status:     Code Status Orders        Start     Ordered   03/30/15 0417  Full code   Continuous     03/30/15 0418     Family Communication: none Disposition Plan: awaiting GI work up to determine if anticoagulation can be resumed DVT prophylaxis: SCDs Consultants:  GI Procedures:  none    Objective: Filed Weights   04/05/15 0552 04/06/15 1330 04/07/15 0435  Weight: 126.78 kg (279 lb 8 oz) 127.189 kg (280 lb 6.4 oz) 127.914 kg (282 lb)    Intake/Output Summary (Last 24 hours) at 04/07/15 1148 Last data filed at 04/07/15 1025  Gross per 24 hour  Intake    684 ml  Output   1300 ml  Net   -616 ml     Vitals Filed Vitals:   04/06/15 1936 04/06/15 2116 04/07/15 0435 04/07/15 0859  BP: 100/50  125/53   Pulse: 65  60   Temp: 98.2 F (36.8 C)  97.9 F (36.6 C)   TempSrc: Oral  Oral   Resp: 18  18   Height:      Weight:   127.914 kg (282 lb)   SpO2: 98% 100% 99% 99%    Exam:  General:  Pt is alert, not in acute distress  Cardiovascular: regular rate and rhythm, S1/S2 No murmur  Respiratory: diminished due  to large body habitus, no wheezing but on breathing treatment  Abdomen: Soft, +Bowel sounds, non tender, non distended, no guarding  MSK: No cyanosis or clubbing- no pedal edema   Data Reviewed: Basic Metabolic Panel:  Recent Labs Lab 04/01/15 0233 04/02/15 0417 04/03/15 0411 04/05/15 0345 04/07/15 0400  NA 141 140 139 139 137  K 5.4* 5.0 3.8 4.2 4.4  CL 102 100* 96* 98* 97*  CO2 30 32 31 29 30   GLUCOSE 163* 144* 89 96 114*  BUN 40* 43* 45* 48* 42*  CREATININE 1.92* 1.80* 1.79* 1.86* 1.74*  CALCIUM 9.0 8.8* 9.1 8.5* 8.7*   Liver Function Tests: No results for input(s): AST, ALT, ALKPHOS, BILITOT, PROT, ALBUMIN in the last 168 hours. No results for input(s): LIPASE, AMYLASE in the last 168 hours. No results for input(s): AMMONIA in the last 168 hours. CBC:  Recent Labs Lab 03/31/15 1625 04/01/15 0738 04/02/15 0417 04/03/15 0411 04/05/15 0345 04/07/15 0400  WBC 8.4  --  9.0 9.8 8.2 9.0  NEUTROABS  --   --   --   --   --  6.1  HGB 6.9* 7.6* 7.5* 9.7* 8.4* 8.6*  HCT 23.7* 25.7* 25.8* 33.2* 29.8* 30.5*  MCV 80.1  --  78.9 80.0 79.9 80.1  PLT 213  --  232 262 230 221   Cardiac Enzymes: No results for input(s): CKTOTAL, CKMB, CKMBINDEX, TROPONINI in the last 168 hours. BNP (last 3 results) No results for input(s): BNP in the last 8760 hours.  ProBNP (last 3 results) No results for input(s): PROBNP in the last 8760 hours.  CBG:  Recent Labs Lab 04/06/15 0629 04/06/15 1645 04/06/15 2223 04/07/15 0634 04/07/15 1059  GLUCAP 108* 147* 100* 97 126*    Recent Results (from the past 240 hour(s))  MRSA PCR Screening     Status: Abnormal   Collection Time: 03/30/15  3:00 AM  Result Value Ref Range Status   MRSA by PCR POSITIVE (A) NEGATIVE Final    Comment:        The GeneXpert MRSA Assay (FDA approved for NASAL specimens only), is one component of a comprehensive MRSA colonization surveillance program. It is not intended to diagnose MRSA infection nor to  guide or monitor treatment for MRSA infections. RESULT CALLED TO, READ BACK BY AND VERIFIED WITH: A BUENDIA @0450  03/30/15 MKELLY   Culture, blood (routine x 2) Call MD if unable to obtain prior to antibiotics being given     Status: None   Collection Time: 03/30/15 10:40 AM  Result Value Ref Range Status   Specimen Description BLOOD LEFT ARM  Final   Special Requests BOTTLES DRAWN AEROBIC ONLY 5CC  Final   Culture NO GROWTH 5 DAYS  Final   Report Status 04/04/2015  FINAL  Final  Culture, blood (routine x 2) Call MD if unable to obtain prior to antibiotics being given     Status: None   Collection Time: 03/30/15 10:50 AM  Result Value Ref Range Status   Specimen Description BLOOD LEFT ANTECUBITAL  Final   Special Requests BOTTLES DRAWN AEROBIC ONLY 5CC  Final   Culture NO GROWTH 5 DAYS  Final   Report Status 04/04/2015 FINAL  Final  Culture, sputum-assessment     Status: None   Collection Time: 04/03/15  8:50 AM  Result Value Ref Range Status   Specimen Description SPUTUM  Final   Special Requests NONE  Final   Sputum evaluation   Final    THIS SPECIMEN IS ACCEPTABLE. RESPIRATORY CULTURE REPORT TO FOLLOW.   Report Status 04/03/2015 FINAL  Final  Culture, respiratory (NON-Expectorated)     Status: None   Collection Time: 04/03/15  8:50 AM  Result Value Ref Range Status   Specimen Description SPUTUM  Final   Special Requests NONE  Final   Gram Stain   Final    FEW WBC PRESENT,BOTH PMN AND MONONUCLEAR FEW SQUAMOUS EPITHELIAL CELLS PRESENT NO ORGANISMS SEEN Performed at [facility]    Culture   Final    NORMAL OROPHARYNGEAL FLORA Performed at [facility]    Report Status 04/05/2015 FINAL  Final     Studies: No results found.  Scheduled Meds:  Scheduled Meds: . sodium chloride   Intravenous Once  . amiodarone  200 mg Oral Daily  . atorvastatin  10 mg Oral Daily  . dextromethorphan-guaiFENesin  1 tablet Oral BID  . ferrous Q000111Q  C-folic acid  1 capsule Oral Daily  . fluticasone  2 spray Each Nare Daily  . insulin aspart  0-15 Units Subcutaneous TID WC  . insulin aspart  0-5 Units Subcutaneous QHS  . ipratropium-albuterol  3 mL Nebulization QID  . isosorbide mononitrate  30 mg Oral Daily  . lidocaine  1 patch Transdermal Q24H  . lisinopril  10 mg Oral Daily  . montelukast  10 mg Oral QHS  . multivitamin with minerals  1 tablet Oral Daily  . pantoprazole  40 mg Oral BID AC  . predniSONE  40 mg Oral Q breakfast  . sertraline  50 mg Oral Daily  . sodium chloride  3 mL Intravenous Q12H  . tiotropium  18 mcg Inhalation Daily  . torsemide  20 mg Oral BID   Continuous Infusions:    Time spent on care of this patient: 35 min   Wylodean Shimmel A, MD  04/07/2015, 11:48 AM  LOS: 8 days   [facility] Office  [phone] Pager - Text Page per [email or web address] If 7PM-7AM, please contact night-coverage [email or web address]

## 2015-04-07 NOTE — Progress Notes (Signed)
Pt home machine and will place on self when ready.

## 2015-04-08 ENCOUNTER — Other Ambulatory Visit: Payer: Self-pay

## 2015-04-08 LAB — BASIC METABOLIC PANEL
ANION GAP: 9 (ref 5–15)
BUN: 45 mg/dL — ABNORMAL HIGH (ref 6–20)
CHLORIDE: 97 mmol/L — AB (ref 101–111)
CO2: 31 mmol/L (ref 22–32)
Calcium: 8.6 mg/dL — ABNORMAL LOW (ref 8.9–10.3)
Creatinine, Ser: 1.89 mg/dL — ABNORMAL HIGH (ref 0.44–1.00)
GFR calc non Af Amer: 26 mL/min — ABNORMAL LOW (ref >60)
GFR, EST AFRICAN AMERICAN: 30 mL/min — AB (ref >60)
Glucose, Bld: 125 mg/dL — ABNORMAL HIGH (ref 65–99)
Potassium: 4.2 mmol/L (ref 3.5–5.1)
SODIUM: 137 mmol/L (ref 135–145)

## 2015-04-08 LAB — GLUCOSE, CAPILLARY
GLUCOSE-CAPILLARY: 100 mg/dL — AB (ref 65–99)
GLUCOSE-CAPILLARY: 115 mg/dL — AB (ref 65–99)
GLUCOSE-CAPILLARY: 138 mg/dL — AB (ref 65–99)
Glucose-Capillary: 199 mg/dL — ABNORMAL HIGH (ref 65–99)

## 2015-04-08 NOTE — Progress Notes (Signed)
Talked with PCCM about central line, MD will plan to place in the AM, thanks Arvella Nigh RN.

## 2015-04-08 NOTE — Progress Notes (Signed)
[facility] Progress Note   Caitlyn Hardy  [id number]  DOB: June 12, 1945  DOA: 03/30/2015 PCP: [name], MD  Brief narrative: Caitlyn Hardy is a 70 y.o. female with COPD on 2 L of oxygen at home, CAD status post CABG, systolic heart failure with an EF of 35-40%, chronic kidney disease stage III, hypertension, hyperlipidemia, diabetes mellitus type 2, history of CVA, atrial fibrillation, pulmonary hypertension and obesity. Patient presents for shortness of breath which has been steadily progressing for the past 2 weeks. It is at the point where she can barely move around without getting short of breath. She has been coughing up greenish colored mucus. Chest x-ray performed in the ER at [facility] revealed congestive heart failure. Also, found to be quite anemic with Hb of 6.   Subjective: Still awaiting on IV line for the EGD to be done. I have asked him to try to stick her peripherally again, if not we have to have the central line placed.  Assessment/Plan:  Acute on chronic respiratory failure (HCC) with hypoxia -Due to acute bronchitis and CHF exacerbation- improving -Wean oxygen as tolerated, patient on 2-3 L of oxygen at home. -Continue diuresis and treatment for acute bronchitis.   Microcytic anemia -GI following-further workup planned for when respiratory status improves -Received 2 units of packed red blood cells-continue to follow hemoglobin to see if she needs further transfusions -Anemia panel suggest AOCD with low normal ferretin and B12 level-  - LDH slightly elevated but no schistocytes on smear - POC HEME POSITIVE - records from [place] regarding prior endosdopies in 2015 in chart reveal she had polyps in the past - Pradaxa on hold -EGD per GI   Acute on chronic systolic heart failure  - neg balance by 15 L- repeat CXR 1/3 show no disease --has been diuresing well with IV Lasix 60 , switched to 20 mg of Demadex twice a day. -Follow weight  and intake/output closely.  Acute bronchitis/ AECOPD - cont Doxycyline, Nebs, dextro/mucinex, Singulair, Spiriva - weaning steroids on PO prednisone - added flutter valve  Hyperkalemia - occurring after blood transfusion and likely secondary to it ?resume lisinopril   h/o  Stroke  -may have been due to PAF    Paroxysmal atrial fibrillation -Currently normal sinus rhythm, on PRADAXA on hold because of anemia.. -Cont Amiodarone -CHA2DS2-VASc score is at least 5 for female, CHF, HTN and 2 points for history of stroke.    Sleep apnea -  CPAP    Diabetes mellitus  - cont sliding scale     CKD (chronic kidney disease), stage 3-4 -at baseline-  follow    Antibiotics: Anti-infectives    Start     Dose/Rate Route Frequency Ordered Stop   03/30/15 1000  doxycycline (VIBRA-TABS) tablet 100 mg  Status:  Discontinued     100 mg Oral Every 12 hours 03/30/15 0420 04/06/15 1009     Code Status:     Code Status Orders        Start     Ordered   03/30/15 0417  Full code   Continuous     03/30/15 0418     Family Communication: none Disposition Plan: awaiting GI work up to determine if anticoagulation can be resumed DVT prophylaxis: SCDs Consultants:  GI Procedures:  none    Objective: Filed Weights   04/06/15 1330 04/07/15 0435 04/08/15 0316  Weight: 127.189 kg (280 lb 6.4 oz) 127.914 kg (282 lb) 128.096 kg (282 lb 6.4 oz)  Intake/Output Summary (Last 24 hours) at 04/08/15 1230 Last data filed at 04/08/15 0900  Gross per 24 hour  Intake   1025 ml  Output   2900 ml  Net  -1875 ml     Vitals Filed Vitals:   04/08/15 0000 04/08/15 0316 04/08/15 0758 04/08/15 0811  BP: 109/38 113/50 141/56   Pulse: 61 66 58   Temp:  97.8 F (36.6 C) 98.1 F (36.7 C)   TempSrc:  Oral Oral   Resp:  18 18   Height:      Weight:  128.096 kg (282 lb 6.4 oz)    SpO2:  98% 100% 100%    Exam:  General:  Pt is alert, not in acute distress  Cardiovascular: regular rate and  rhythm, S1/S2 No murmur  Respiratory: diminished due to large body habitus, no wheezing but on breathing treatment  Abdomen: Soft, +Bowel sounds, non tender, non distended, no guarding  MSK: No cyanosis or clubbing- no pedal edema   Data Reviewed: Basic Metabolic Panel:  Recent Labs Lab 04/02/15 0417 04/03/15 0411 04/05/15 0345 04/07/15 0400 04/08/15 0239  NA 140 139 139 137 137  K 5.0 3.8 4.2 4.4 4.2  CL 100* 96* 98* 97* 97*  CO2 32 31 29 30 31   GLUCOSE 144* 89 96 114* 125*  BUN 43* 45* 48* 42* 45*  CREATININE 1.80* 1.79* 1.86* 1.74* 1.89*  CALCIUM 8.8* 9.1 8.5* 8.7* 8.6*   Liver Function Tests: No results for input(s): AST, ALT, ALKPHOS, BILITOT, PROT, ALBUMIN in the last 168 hours. No results for input(s): LIPASE, AMYLASE in the last 168 hours. No results for input(s): AMMONIA in the last 168 hours. CBC:  Recent Labs Lab 04/02/15 0417 04/03/15 0411 04/05/15 0345 04/07/15 0400  WBC 9.0 9.8 8.2 9.0  NEUTROABS  --   --   --  6.1  HGB 7.5* 9.7* 8.4* 8.6*  HCT 25.8* 33.2* 29.8* 30.5*  MCV 78.9 80.0 79.9 80.1  PLT 232 262 230 221   Cardiac Enzymes: No results for input(s): CKTOTAL, CKMB, CKMBINDEX, TROPONINI in the last 168 hours. BNP (last 3 results) No results for input(s): BNP in the last 8760 hours.  ProBNP (last 3 results) No results for input(s): PROBNP in the last 8760 hours.  CBG:  Recent Labs Lab 04/07/15 1059 04/07/15 1617 04/07/15 2052 04/08/15 0612 04/08/15 1214  GLUCAP 126* 223* 157* 100* 115*    Recent Results (from the past 240 hour(s))  MRSA PCR Screening     Status: Abnormal   Collection Time: 03/30/15  3:00 AM  Result Value Ref Range Status   MRSA by PCR POSITIVE (A) NEGATIVE Final    Comment:        The GeneXpert MRSA Assay (FDA approved for NASAL specimens only), is one component of a comprehensive MRSA colonization surveillance program. It is not intended to diagnose MRSA infection nor to guide or monitor treatment  for MRSA infections. RESULT CALLED TO, READ BACK BY AND VERIFIED WITH: A BUENDIA @0450  03/30/15 MKELLY   Culture, blood (routine x 2) Call MD if unable to obtain prior to antibiotics being given     Status: None   Collection Time: 03/30/15 10:40 AM  Result Value Ref Range Status   Specimen Description BLOOD LEFT ARM  Final   Special Requests BOTTLES DRAWN AEROBIC ONLY 5CC  Final   Culture NO GROWTH 5 DAYS  Final   Report Status 04/04/2015 FINAL  Final  Culture, blood (routine x 2) Call MD  if unable to obtain prior to antibiotics being given     Status: None   Collection Time: 03/30/15 10:50 AM  Result Value Ref Range Status   Specimen Description BLOOD LEFT ANTECUBITAL  Final   Special Requests BOTTLES DRAWN AEROBIC ONLY 5CC  Final   Culture NO GROWTH 5 DAYS  Final   Report Status 04/04/2015 FINAL  Final  Culture, sputum-assessment     Status: None   Collection Time: 04/03/15  8:50 AM  Result Value Ref Range Status   Specimen Description SPUTUM  Final   Special Requests NONE  Final   Sputum evaluation   Final    THIS SPECIMEN IS ACCEPTABLE. RESPIRATORY CULTURE REPORT TO FOLLOW.   Report Status 04/03/2015 FINAL  Final  Culture, respiratory (NON-Expectorated)     Status: None   Collection Time: 04/03/15  8:50 AM  Result Value Ref Range Status   Specimen Description SPUTUM  Final   Special Requests NONE  Final   Gram Stain   Final    FEW WBC PRESENT,BOTH PMN AND MONONUCLEAR FEW SQUAMOUS EPITHELIAL CELLS PRESENT NO ORGANISMS SEEN Performed at [facility]    Culture   Final    NORMAL OROPHARYNGEAL FLORA Performed at [facility]    Report Status 04/05/2015 FINAL  Final     Studies: No results found.  Scheduled Meds:  Scheduled Meds: . amiodarone  200 mg Oral Daily  . atorvastatin  10 mg Oral Daily  . dextromethorphan-guaiFENesin  1 tablet Oral BID  . ferrous Q000111Q C-folic acid  1 capsule Oral Daily  . fluticasone  2 spray Each Nare  Daily  . insulin aspart  0-15 Units Subcutaneous TID WC  . insulin aspart  0-5 Units Subcutaneous QHS  . ipratropium-albuterol  3 mL Nebulization QID  . isosorbide mononitrate  30 mg Oral Daily  . lidocaine  1 patch Transdermal Q24H  . lisinopril  10 mg Oral Daily  . montelukast  10 mg Oral QHS  . multivitamin with minerals  1 tablet Oral Daily  . pantoprazole  40 mg Oral BID AC  . predniSONE  40 mg Oral Q breakfast  . sertraline  50 mg Oral Daily  . sodium chloride  3 mL Intravenous Q12H  . tiotropium  18 mcg Inhalation Daily  . torsemide  20 mg Oral BID   Continuous Infusions:    Time spent on care of this patient: 35 min   Jasani Dolney A, MD  04/08/2015, 12:30 PM  LOS: 9 days   [facility] Office  [phone] Pager - Text Page per [email or web address] If 7PM-7AM, please contact night-coverage [email or web address]

## 2015-04-08 NOTE — Progress Notes (Signed)
Pt. c/o chest pain rt. sided chest pain under breast radiating to lt side 10/10. EKG obtained. MD made aware. Constant pain not easing given 1 Nitro sublingual.

## 2015-04-09 DIAGNOSIS — G2581 Restless legs syndrome: Secondary | ICD-10-CM

## 2015-04-09 DIAGNOSIS — I10 Essential (primary) hypertension: Secondary | ICD-10-CM

## 2015-04-09 LAB — GLUCOSE, CAPILLARY
GLUCOSE-CAPILLARY: 99 mg/dL (ref 65–99)
GLUCOSE-CAPILLARY: 176 mg/dL — AB (ref 65–99)
GLUCOSE-CAPILLARY: 216 mg/dL — AB (ref 65–99)
Glucose-Capillary: 149 mg/dL — ABNORMAL HIGH (ref 65–99)

## 2015-04-09 LAB — BASIC METABOLIC PANEL
ANION GAP: 12 (ref 5–15)
BUN: 40 mg/dL — ABNORMAL HIGH (ref 6–20)
CALCIUM: 8.7 mg/dL — AB (ref 8.9–10.3)
CO2: 30 mmol/L (ref 22–32)
Chloride: 95 mmol/L — ABNORMAL LOW (ref 101–111)
Creatinine, Ser: 1.71 mg/dL — ABNORMAL HIGH (ref 0.44–1.00)
GFR, EST AFRICAN AMERICAN: 34 mL/min — AB (ref >60)
GFR, EST NON AFRICAN AMERICAN: 29 mL/min — AB (ref >60)
Glucose, Bld: 105 mg/dL — ABNORMAL HIGH (ref 65–99)
POTASSIUM: 4.5 mmol/L (ref 3.5–5.1)
Sodium: 137 mmol/L (ref 135–145)

## 2015-04-09 MED ORDER — SODIUM CHLORIDE 0.9 % IJ SOLN
10.0000 mL | INTRAMUSCULAR | Status: DC | PRN
  Administered 2015-04-10: 10 mL
  Filled 2015-04-09: qty 40

## 2015-04-09 NOTE — Progress Notes (Signed)
Physical Therapy Treatment Patient Details Name: Caitlyn Hardy MRN: [id number] DOB: October 16, 1945 Today's Date: 04/09/2015    History of Present Illness pt is a 70 y/o female with h/o RLS, CVA, cardiomyopathy, pulm HTN, MI, CA, PNA, admitted with SOB, cough, wheezing.  In ED found to have Hgb of 6.7.  Work up to include upper and lower GI scoping.    PT Comments    Patient progressing with mobility, tolerance to activity.  Still needs supervision for safety with ambulation over longer distances due to LE weakness, has met all other goals.  Will continue to follow acutely with follow up HHPT recommended.  Follow Up Recommendations  Home health PT     Equipment Recommendations  None recommended by PT    Recommendations for Other Services       Precautions / Restrictions Precautions Precautions: Fall    Mobility  Bed Mobility Overal bed mobility: Modified Independent                Transfers Overall transfer level: Modified independent                  Ambulation/Gait Ambulation/Gait assistance: Supervision Ambulation Distance (Feet): 380 Feet Assistive device: Rolling walker (2 wheeled) Gait Pattern/deviations: Step-through pattern;Decreased weight shift to left;Wide base of support;Decreased stride length     General Gait Details: increased time with turns with walker, decreased stance time L due to fear of buckling reports fell 2 weeks prior to admission due to knee giving away   Stairs            Wheelchair Mobility    Modified Rankin (Stroke Patients Only)       Balance Overall balance assessment: Needs assistance         Standing balance support: Bilateral upper extremity supported Standing balance-Leahy Scale: Poor Standing balance comment: UE support needed due to fear of knee buckling                    Cognition Arousal/Alertness: Awake/alert Behavior During Therapy: WFL for tasks assessed/performed Overall Cognitive  Status: Within Functional Limits for tasks assessed                      Exercises      General Comments General comments (skin integrity, edema, etc.): wheezing and DOE noted with ambulation on 3L O2.  Reviewed energy conservation, pt independently relates techniques      Pertinent Vitals/Pain Faces Pain Scale: Hurts little more Pain Location: generalized Pain Descriptors / Indicators: Aching Pain Intervention(s): Monitored during session  SpO2 94% ambulating on 3L O2 DOE 2/4; HR 77    Home Living                      Prior Function            PT Goals (current goals can now be found in the care plan section) Progress towards PT goals: Progressing toward goals    Frequency  Min 3X/week    PT Plan Current plan remains appropriate    Co-evaluation             End of Session Equipment Utilized During Treatment: Gait belt Activity Tolerance: Patient tolerated treatment well Patient left: in chair;with call bell/phone within reach     Time: 1155-1222 PT Time Calculation (min) (ACUTE ONLY): 27 min  Charges:  $Gait Training: 23-37 mins  G CodesReginia Naas 04/09/2015, 12:41 PM  Magda Kiel, [place] 04/09/2015

## 2015-04-09 NOTE — Progress Notes (Signed)
[facility] Progress Note   Caitlyn Hardy  [id number]  DOB: [birth year]  DOA: 03/30/2015 PCP: [name], MD  Brief narrative: Caitlyn Hardy is a 70 y.o. female with COPD on 2 L of oxygen at home, CAD status post CABG, systolic heart failure with an EF of 35-40%, chronic kidney disease stage III, hypertension, hyperlipidemia, diabetes mellitus type 2, history of CVA, atrial fibrillation, pulmonary hypertension and obesity. Patient presents for shortness of breath which has been steadily progressing for the past 2 weeks. It is at the point where she can barely move around without getting short of breath. She has been coughing up greenish colored mucus. Chest x-ray performed in the ER at [facility] revealed congestive heart failure. Also, found to be quite anemic with Hb of 6.   Subjective: Discussed with Dr. Oletta Lamas of gastroenterology, EGD scheduled for tomorrow. Meanwhile we'll try to place midline. Continue to hold PRADAXA till EGD recommendation.  Assessment/Plan:  Acute on chronic respiratory failure (HCC) with hypoxia -Due to acute bronchitis and CHF exacerbation- improving -Wean oxygen as tolerated, patient on 2-3 L of oxygen at home. -Continue diuresis and treatment for acute bronchitis.   Microcytic anemia -GI following-further workup planned for when respiratory status improves -Received 2 units of packed red blood cells-continue to follow hemoglobin to see if she needs further transfusions -Anemia panel suggest AOCD with low normal ferretin and B12 level-  - LDH slightly elevated but no schistocytes on smear - POC HEME POSITIVE - records from [place] regarding prior endosdopies in 2015 in chart reveal she had polyps in the past - Pradaxa on hold -EGD per GI   Acute on chronic systolic heart failure  -Neg balance by 19 L- repeat CXR 1/3 show no disease -Has been diuresing well with IV Lasix 60 , switched to 20 mg of Demadex twice a day. -Follow  weight and intake/output closely.  Acute bronchitis/ AECOPD - cont Doxycyline, Nebs, dextro/mucinex, Singulair, Spiriva - weaning steroids on PO prednisone - added flutter valve  Hyperkalemia - occurring after blood transfusion and likely secondary to it ?resume lisinopril   h/o  Stroke  -may have been due to PAF    Paroxysmal atrial fibrillation -Currently normal sinus rhythm, on PRADAXA on hold because of anemia.. -Cont Amiodarone -CHA2DS2-VASc score is at least 5 for female, CHF, HTN and 2 points for history of stroke.    Sleep apnea -  CPAP    Diabetes mellitus  - cont sliding scale     CKD (chronic kidney disease), stage 3-4 -at baseline-  follow    Antibiotics: Anti-infectives    Start     Dose/Rate Route Frequency Ordered Stop   03/30/15 1000  doxycycline (VIBRA-TABS) tablet 100 mg  Status:  Discontinued     100 mg Oral Every 12 hours 03/30/15 0420 04/06/15 1009     Code Status:     Code Status Orders        Start     Ordered   03/30/15 0417  Full code   Continuous     03/30/15 0418     Family Communication: none Disposition Plan: awaiting GI work up to determine if anticoagulation can be resumed DVT prophylaxis: SCDs Consultants:  GI Procedures:  none    Objective: Filed Weights   04/07/15 0435 04/08/15 0316 04/09/15 0348  Weight: 127.914 kg (282 lb) 128.096 kg (282 lb 6.4 oz) 127.506 kg (281 lb 1.6 oz)    Intake/Output Summary (Last 24 hours) at 04/09/15 1358 Last  data filed at 04/09/15 1336  Gross per 24 hour  Intake    540 ml  Output   2650 ml  Net  -2110 ml     Vitals Filed Vitals:   04/09/15 0000 04/09/15 0348 04/09/15 0755 04/09/15 1146  BP: 126/51 147/65  108/41  Pulse: 61 71  63  Temp:  98.4 F (36.9 C)  98.1 F (36.7 C)  TempSrc:  Oral  Oral  Resp:  18  18  Height:      Weight:  127.506 kg (281 lb 1.6 oz)    SpO2:  94% 97% 95%    Exam:  General:  Pt is alert, not in acute distress  Cardiovascular: regular rate  and rhythm, S1/S2 No murmur  Respiratory: diminished due to large body habitus, no wheezing but on breathing treatment  Abdomen: Soft, +Bowel sounds, non tender, non distended, no guarding  MSK: No cyanosis or clubbing- no pedal edema   Data Reviewed: Basic Metabolic Panel:  Recent Labs Lab 04/03/15 0411 04/05/15 0345 04/07/15 0400 04/08/15 0239 04/09/15 0338  NA 139 139 137 137 137  K 3.8 4.2 4.4 4.2 4.5  CL 96* 98* 97* 97* 95*  CO2 31 29 30 31 30   GLUCOSE 89 96 114* 125* 105*  BUN 45* 48* 42* 45* 40*  CREATININE 1.79* 1.86* 1.74* 1.89* 1.71*  CALCIUM 9.1 8.5* 8.7* 8.6* 8.7*   Liver Function Tests: No results for input(s): AST, ALT, ALKPHOS, BILITOT, PROT, ALBUMIN in the last 168 hours. No results for input(s): LIPASE, AMYLASE in the last 168 hours. No results for input(s): AMMONIA in the last 168 hours. CBC:  Recent Labs Lab 04/03/15 0411 04/05/15 0345 04/07/15 0400  WBC 9.8 8.2 9.0  NEUTROABS  --   --  6.1  HGB 9.7* 8.4* 8.6*  HCT 33.2* 29.8* 30.5*  MCV 80.0 79.9 80.1  PLT 262 230 221   Cardiac Enzymes: No results for input(s): CKTOTAL, CKMB, CKMBINDEX, TROPONINI in the last 168 hours. BNP (last 3 results) No results for input(s): BNP in the last 8760 hours.  ProBNP (last 3 results) No results for input(s): PROBNP in the last 8760 hours.  CBG:  Recent Labs Lab 04/08/15 1214 04/08/15 1705 04/08/15 2107 04/09/15 0543 04/09/15 1144  GLUCAP 115* 199* 138* 99 176*    Recent Results (from the past 240 hour(s))  Culture, sputum-assessment     Status: None   Collection Time: 04/03/15  8:50 AM  Result Value Ref Range Status   Specimen Description SPUTUM  Final   Special Requests NONE  Final   Sputum evaluation   Final    THIS SPECIMEN IS ACCEPTABLE. RESPIRATORY CULTURE REPORT TO FOLLOW.   Report Status 04/03/2015 FINAL  Final  Culture, respiratory (NON-Expectorated)     Status: None   Collection Time: 04/03/15  8:50 AM  Result Value Ref Range  Status   Specimen Description SPUTUM  Final   Special Requests NONE  Final   Gram Stain   Final    FEW WBC PRESENT,BOTH PMN AND MONONUCLEAR FEW SQUAMOUS EPITHELIAL CELLS PRESENT NO ORGANISMS SEEN Performed at [facility]    Culture   Final    NORMAL OROPHARYNGEAL FLORA Performed at [facility]    Report Status 04/05/2015 FINAL  Final     Studies: No results found.  Scheduled Meds:  Scheduled Meds: . amiodarone  200 mg Oral Daily  . atorvastatin  10 mg Oral Daily  . dextromethorphan-guaiFENesin  1 tablet Oral BID  .  ferrous [id number] C-folic acid  1 capsule Oral Daily  . fluticasone  2 spray Each Nare Daily  . insulin aspart  0-15 Units Subcutaneous TID WC  . insulin aspart  0-5 Units Subcutaneous QHS  . ipratropium-albuterol  3 mL Nebulization QID  . isosorbide mononitrate  30 mg Oral Daily  . lidocaine  1 patch Transdermal Q24H  . lisinopril  10 mg Oral Daily  . montelukast  10 mg Oral QHS  . multivitamin with minerals  1 tablet Oral Daily  . pantoprazole  40 mg Oral BID AC  . predniSONE  40 mg Oral Q breakfast  . sertraline  50 mg Oral Daily  . sodium chloride  3 mL Intravenous Q12H  . tiotropium  18 mcg Inhalation Daily  . torsemide  20 mg Oral BID   Continuous Infusions:    Time spent on care of this patient: 35 min   Zayed Griffie A, MD  04/09/2015, 1:58 PM  LOS: 10 days   [facility] Office  [phone] Pager - Text Page per [email or web address] If 7PM-7AM, please contact night-coverage [email or web address]

## 2015-04-09 NOTE — Progress Notes (Signed)
Peripherally Inserted Central Catheter/Midline Placement  The IV Nurse has discussed with the patient and/or persons authorized to consent for the patient, the purpose of this procedure and the potential benefits and risks involved with this procedure.  The benefits include less needle sticks, lab draws from the catheter and patient may be discharged home with the catheter.  Risks include, but not limited to, infection, bleeding, blood clot (thrombus formation), and puncture of an artery; nerve damage and irregular heat beat.  Alternatives to this procedure were also discussed.  PICC/Midline Placement Documentation  PICC Single Lumen 04/09/15 Midline Left Basilic 16 cm 0 cm (Active)  Indication for Insertion or Continuance of Line Poor Vasculature-patient has had multiple peripheral attempts or PIVs lasting less than 24 hours 04/03/2015  2:00 AM  Exposed Catheter (cm) 0 cm 04/03/2015  2:00 AM  Site Assessment Clean;Dry;Intact 04/03/2015  2:00 AM  Line Status Flushed;Saline locked;Blood return noted 04/03/2015  2:00 AM  Dressing Type Transparent 04/03/2015  2:00 AM  Dressing Status Clean;Dry;Intact;Antimicrobial disc in place 04/03/2015  2:00 AM  Dressing Intervention New dressing 04/03/2015  2:00 AM  Dressing Change Due 04/16/15 04/03/2015  2:00 AM       Aldona Lento L 04/09/2015, 8:35 PM

## 2015-04-09 NOTE — Progress Notes (Signed)
[facility] PROGRESS NOTE Subjective Pt still w/o good IV was NPO but rescheduled EGD to tomorrow.  Objective: Vital signs in last 24 hours: Temp:  [98.1 F (36.7 C)-98.6 F (37 C)] 98.4 F (36.9 C) (01/09 0348) Pulse Rate:  [57-71] 71 (01/09 0348) Resp:  [18] 18 (01/09 0348) BP: (99-147)/(44-65) 147/65 mmHg (01/09 0348) SpO2:  [94 %-98 %] 97 % (01/09 0755) Weight:  [127.506 kg (281 lb 1.6 oz)] 127.506 kg (281 lb 1.6 oz) (01/09 0348) Last BM Date: 04/07/15  Intake/Output from previous day: 01/08 0701 - 01/09 0700 In: 900 [P.O.:900] Out: 2000 [Urine:2000] Intake/Output this shift: Total I/O In: 0  Out: 650 [Urine:650]  PE: General-- obese white female in acute distress   Lungs-- clear Abdomen-- soft and nontender  Lab Results:  Recent Labs  04/07/15 0400  WBC 9.0  HGB 8.6*  HCT 30.5*  PLT 221   BMET  Recent Labs  04/07/15 0400 04/08/15 0239 04/09/15 0338  NA 137 137 137  K 4.4 4.2 4.5  CL 97* 97* 95*  CO2 30 31 30   CREATININE 1.74* 1.89* 1.71*   LFT No results for input(s): PROT, AST, ALT, ALKPHOS, BILITOT, BILIDIR, IBILI in the last 72 hours. PT/INR No results for input(s): LABPROT, INR in the last 72 hours. PANCREAS No results for input(s): LIPASE in the last 72 hours.       Studies/Results: No results found.  Medications: I have reviewed the patient's current medications.  Assessment/Plan: 1. Stool positive for FOB. Patient has been on anticoagulation for chronic atrial fib. Due toIV access problems, we still have not as of yet been able to perform EGD. Per note from Dr. Amedeo Plenty, patient has had EGD and colonoscopy within the past 2 years done another location but I think it would be reasonable to go ahead with EGD now. Will plan on trying to go ahead with this 9 o'clock in the morning. Hopefully will be able to obtain better IV access.   Charday Capetillo JR,Licia Harl L 04/09/2015, 11:00 AM  Pager: [phone] If no answer or after hours  call [phone]

## 2015-04-10 ENCOUNTER — Encounter (HOSPITAL_COMMUNITY): Payer: Self-pay

## 2015-04-10 ENCOUNTER — Encounter (HOSPITAL_COMMUNITY)
Admission: AD | Disposition: A | Discharge status: Home Health | Payer: Self-pay | Source: Other Acute Inpatient Hospital | Attending: Internal Medicine

## 2015-04-10 HISTORY — PX: ESOPHAGOGASTRODUODENOSCOPY: SHX5428

## 2015-04-10 LAB — GLUCOSE, CAPILLARY
GLUCOSE-CAPILLARY: 112 mg/dL — AB (ref 65–99)
GLUCOSE-CAPILLARY: 81 mg/dL (ref 65–99)

## 2015-04-10 LAB — BASIC METABOLIC PANEL
ANION GAP: 9 (ref 5–15)
BUN: 43 mg/dL — AB (ref 6–20)
CALCIUM: 8.6 mg/dL — AB (ref 8.9–10.3)
CO2: 35 mmol/L — ABNORMAL HIGH (ref 22–32)
Chloride: 95 mmol/L — ABNORMAL LOW (ref 101–111)
Creatinine, Ser: 1.89 mg/dL — ABNORMAL HIGH (ref 0.44–1.00)
GFR calc Af Amer: 30 mL/min — ABNORMAL LOW (ref >60)
GFR, EST NON AFRICAN AMERICAN: 26 mL/min — AB (ref >60)
Glucose, Bld: 106 mg/dL — ABNORMAL HIGH (ref 65–99)
POTASSIUM: 4.6 mmol/L (ref 3.5–5.1)
Sodium: 139 mmol/L (ref 135–145)

## 2015-04-10 SURGERY — EGD (ESOPHAGOGASTRODUODENOSCOPY)
Anesthesia: Moderate Sedation

## 2015-04-10 MED ORDER — SODIUM CHLORIDE 0.9 % IV SOLN
INTRAVENOUS | Status: DC
  Administered 2015-04-10: 06:00:00 via INTRAVENOUS

## 2015-04-10 MED ORDER — FENTANYL CITRATE (PF) 100 MCG/2ML IJ SOLN
INTRAMUSCULAR | Status: AC
  Filled 2015-04-10: qty 2

## 2015-04-10 MED ORDER — DABIGATRAN ETEXILATE MESYLATE 150 MG PO CAPS
150.0000 mg | ORAL_CAPSULE | Freq: Two times a day (BID) | ORAL | Status: DC
  Administered 2015-04-10: 150 mg via ORAL
  Filled 2015-04-10: qty 1

## 2015-04-10 MED ORDER — MIDAZOLAM HCL 5 MG/ML IJ SOLN
INTRAMUSCULAR | Status: AC
  Filled 2015-04-10: qty 2

## 2015-04-10 MED ORDER — PREDNISONE 20 MG PO TABS: ORAL_TABLET | ORAL | Status: DC

## 2015-04-10 MED ORDER — DIPHENHYDRAMINE HCL 50 MG/ML IJ SOLN
INTRAMUSCULAR | Status: AC
  Filled 2015-04-10: qty 1

## 2015-04-10 MED ORDER — MIDAZOLAM HCL 10 MG/2ML IJ SOLN
INTRAMUSCULAR | Status: DC | PRN
Start: 2015-04-10 — End: 2015-04-10
  Administered 2015-04-10: 1 mg via INTRAVENOUS
  Administered 2015-04-10: 2 mg via INTRAVENOUS

## 2015-04-10 MED ORDER — FENTANYL CITRATE (PF) 100 MCG/2ML IJ SOLN
INTRAMUSCULAR | Status: DC | PRN
  Administered 2015-04-10: 25 ug via INTRAVENOUS

## 2015-04-10 MED ORDER — BUTAMBEN-TETRACAINE-BENZOCAINE 2-2-14 % EX AERO
INHALATION_SPRAY | CUTANEOUS | Status: DC | PRN
  Administered 2015-04-10: 2 via TOPICAL

## 2015-04-10 MED ORDER — IPRATROPIUM-ALBUTEROL 0.5-2.5 (3) MG/3ML IN SOLN
RESPIRATORY_TRACT | Status: AC
  Filled 2015-04-10: qty 3

## 2015-04-10 MED ORDER — SUCRALFATE 1 G PO TABS: 1.0000 g | ORAL_TABLET | Freq: Three times a day (TID) | ORAL | Status: DC

## 2015-04-10 MED ORDER — HEMATINIC PLUS COMPLEX 106-1 MG PO TABS: 1.0000 | ORAL_TABLET | Freq: Every day | ORAL | Status: DC

## 2015-04-10 NOTE — Progress Notes (Signed)
OT Cancellation Note  Patient Details Name: Caitlyn Hardy MRN: [id number] DOB: 06-24-1945   Cancelled Treatment:    Reason Eval/Treat Not Completed:  (Pt reports she has done her UE exercises and does not want to do them now as she is in pain and tired)  Benito Mccreedy OTR/L [id number] 04/10/2015, 1:57 PM

## 2015-04-10 NOTE — Discharge Summary (Signed)
Physician Discharge Summary  Caitlyn Hardy [id number] DOB: 09/20/1945 DOA: 03/30/2015  PCP: [name], MD  Admit date: 03/30/2015 Discharge date: 04/10/2015  Time spent:40 minutes  Recommendations for Outpatient Follow-up:  1. Follow-up with primary care physician within one week. 2. Lisinopril discontinued because of CKD, consider restarting restarting if creatinine is stable. 3. Check BMP in 1 week.   Discharge Diagnoses:  Principal Problem:   Acute on chronic respiratory failure (HCC) Active Problems:   Microcytic anemia   Acute on chronic systolic heart failure (HCC)   Stroke (HCC)   Thrombus of left atrial appendage   Restless leg   CAD (coronary artery disease)   Hypertension   Paroxysmal atrial fibrillation (HCC)   Depression   COPD exacerbation (HCC)   Sleep apnea   Diabetes mellitus ([place])   Pulmonary hypertension ([place])   CHF exacerbation ([place])   CKD (chronic kidney disease), stage III   Discharge Condition:Stable  Diet recommendation:  heart healthy  Filed Weights   04/08/15 0316 04/09/15 0348 04/10/15 0504  Weight: 128.096 kg (282 lb 6.4 oz) 127.506 kg (281 lb 1.6 oz) 127.1 kg (280 lb 3.3 oz)    History of present illness:  Caitlyn Hardy is a 70 y.o. female with PMH of hypertension, hyperlipidemia, diabetes mellitus, COPD, asthma, on 2 L oxygen at home, GERD, depression, CAD, S/P CABG, obesity, RLS, stroke, atrial fibrillation on pradaxa, pulmonary hypertension, systolic congestive heart failure with the EF 35-40 percent, chronic kidney disease-III, who presents with shortness of breath, cough and wheezing.   Patient is transferred from [facility], accepted by Dr. Dreama Saa.  Patient reports that since yesterday she has been having worsening shortness of breath, cough and wheezing. She also has orthopnea. She coughs up brownish colored sputum. She does not have chest pain, fever or chills. She reports that she has chronic mild right upper  quadrant abdominal pain, which has not changed recently. No diarrhea, symptoms of a UTI, unilateral weakness. No hematochezia, dark stool, hematemesis, or bleeding tendency.  In ED, patient was found to have hemoglobin 6.7 (per EDP's note, patient may had hemoglobin 11-12 in May of this year), lactate 1.63, negative troponin, BNP 471, WBC 2.95, temperature normal, no tachycardia, stable renal function, negative flu test. Chest x-ray showed pulmonary edema, but no infiltration. Patient admitted to inpatient for further evaluation and treatment.  Hospital Course:   Acute on chronic respiratory failure (HCC) with hypoxia -Due to acute bronchitis and CHF exacer, this is improved and she is back to her baseline. -Wean oxygen as tolerated, patient on 2-3 L of oxygen at home. -Discharged on her home dose of diuretics, prednisone taper.   Microcytic anemia -GI following-further workup planned for when respiratory status improves -Received 2 units of packed red blood cells-continue to follow hemoglobin to see if she needs further transfusions -Anemia panel suggest AOCD with low normal ferretin and B12 level-  - LDH slightly elevated but no schistocytes on smear - has positive FOBT and drop in hemoglobin, hemoglobin was 6.9 on admission, improved after 2 units of blood transfused. -Seen by GI, EGD was done on 04/10/15 and showedgastroparesis gastritis reviewed -Recommend PPI and Carafate.   Acute on chronic systolic heart failure  -Neg balance by 19 L- repeat CXR 1/3 show no disease -Has been diuresing well with IV Lasix 60 , switched to 20 mg of Demadex twice a day. -Follow weight and intake/output closely, follow-up with primary care physician as outpatient.  Acute bronchitis/ AECOP -was on doxycycline, continued  intended antibiotic course -Discharged on prednisone taper.   Hyperkalemia - occurring after blood transfusion and likely secondary to it ?resume lisinopril  h/o Stroke  -may  have been due to PAF   Paroxysmal atrial fibrillation -Currently normal sinus rhythm, on PRADAXA, restarted on discharge -Cont Amiodarone -CHA2DS2-VASc score is at least 5 for female, CHF, HTN and 2 points for history of stroke.   Sleep apnea - CPAP   Diabetes mellitus  - cont sliding scale   CKD (chronic kidney disease), stage 3-4 -at baseline- follow    Procedures:  EGD done by Dr. Oletta Lamas on 04/10/2015 ENDOSCOPIC IMPRESSION: 1. Gastritis in antrum with inflamed polypoid mucosa biopsied 2. Gastroparesis RECOMMENDATIONS: we will add carafate to her PPI therapy and check the results of the antral biopsies  Consultations:  None  Discharge Exam: Filed Vitals:   04/10/15 0950 04/10/15 1100  BP: 114/40 113/47  Pulse: 60 60  Temp:    Resp: 16    General: Alert and awake, oriented x3, not in any acute distress. HEENT: anicteric sclera, pupils reactive to light and accommodation, EOMI CVS: S1-S2 clear, no murmur rubs or gallops Chest: clear to auscultation bilaterally, no wheezing, rales or rhonchi Abdomen: soft nontender, nondistended, normal bowel sounds, no organomegaly Extremities: no cyanosis, clubbing or edema noted bilaterally Neuro: Cranial nerves II-XII intact, no focal neurological deficits   Discharge Instructions   Discharge Instructions    Diet - low sodium heart healthy    Complete by:  As directed      Increase activity slowly    Complete by:  As directed           Current Discharge Medication List    START taking these medications   Details  sucralfate (CARAFATE) 1 g tablet Take 1 tablet (1 g total) by mouth 4 (four) times daily -  with meals and at bedtime. Qty: 120 tablet, Refills: 0      CONTINUE these medications which have CHANGED   Details  Fe Fum-FA-B Cmp-C-Zn-Mg-Mn-Cu (HEMATINIC PLUS COMPLEX) 106-1 MG TABS Take 1 tablet by mouth daily. Qty: 30 each, Refills: 0    predniSONE (DELTASONE) 20 MG tablet Take 4 tabs orally by  mouth for 2 days, then take 3 tabs orally by mouth for 2 days, then take 2 tabs orally by mouth for 2 days, then take 1 tabs orally by mouth for 2 days, then stop. Qty: 21 tablet, Refills: 0      CONTINUE these medications which have NOT CHANGED   Details  albuterol (PROVENTIL HFA;VENTOLIN HFA) 108 (90 BASE) MCG/ACT inhaler Inhale 1-2 puffs into the lungs every 6 (six) hours as needed for wheezing or shortness of breath.    amiodarone (PACERONE) 200 MG tablet Take 1 tablet (200 mg total) by mouth daily. Qty: 90 tablet, Refills: 3    atorvastatin (LIPITOR) 10 MG tablet Take 1 tablet (10 mg total) by mouth daily. Qty: 30 tablet, Refills: 2    diphenhydrAMINE (SOMINEX) 25 MG tablet Take 25 mg by mouth at bedtime as needed for allergies or sleep.    fluticasone (FLONASE) 50 MCG/ACT nasal spray Place 2 sprays into the nose daily.    Fluticasone-Salmeterol (ADVAIR) 250-50 MCG/DOSE AEPB Inhale 1 puff into the lungs every 12 (twelve) hours.    HYDROcodone-acetaminophen (NORCO) 10-325 MG per tablet Take 1 tablet by mouth every 6 (six) hours as needed for pain.    ipratropium-albuterol (DUONEB) 0.5-2.5 (3) MG/3ML SOLN Take 3 mLs by nebulization every 6 (six) hours as  needed (wheezing).     isosorbide mononitrate (IMDUR) 30 MG 24 hr tablet Take 1 tablet (30 mg total) by mouth daily. Qty: 30 tablet, Refills: 6    lidocaine (LIDODERM) 5 % PLACE 1 PATCH ONTO THE SKIN EVERY DAY. REMOVE AND DISCARD PATCH WITHIN 12 HOURS OR AS DIRECTED BY MD Qty: 30 patch, Refills: 0    montelukast (SINGULAIR) 10 MG tablet Take 1 tablet (10 mg total) by mouth at bedtime. Qty: 30 tablet, Refills: 0    NITROSTAT 0.4 MG SL tablet PLACE 1 TABLET UNDER TONGUE EVERY 5 MINUTES FOR 3 DOSES AS NEEDED FOR CHEST PAIN Qty: 100 tablet, Refills: 0    !! NON FORMULARY CPAP nightly    !! NON FORMULARY Oxygen at 2L/min while out, prn other times.    NOVOLOG FLEXPEN 100 UNIT/ML FlexPen Inject 0-15 Units into the skin 3 (three)  times daily. 0-15 units per sliding scale up to three times daily Refills: 5    omeprazole (PRILOSEC) 40 MG capsule Take 1 capsule (40 mg total) by mouth 2 (two) times daily. Qty: 180 capsule, Refills: 3    potassium chloride SA (K-DUR,KLOR-CON) 20 MEQ tablet Take 2 tablets (40 mEq total) by mouth 2 (two) times daily. Qty: 120 tablet, Refills: 2    PRADAXA 150 MG CAPS capsule TAKE 1 CAPSULE BY MOUTH EVERY 12 HOURS Qty: 60 capsule, Refills: 6    rOPINIRole (REQUIP) 2 MG tablet Take 2 mg by mouth at bedtime.    sertraline (ZOLOFT) 50 MG tablet Take 50 mg by mouth daily.    temazepam (RESTORIL) 15 MG capsule Take 15 mg by mouth at bedtime as needed for sleep.    tiotropium (SPIRIVA) 18 MCG inhalation capsule Place 18 mcg into inhaler and inhale daily.    torsemide (DEMADEX) 20 MG tablet Take 20 mg by mouth 2 (two) times daily. Dose decrease 05/18/14     !! - Potential duplicate medications found. Please discuss with provider.    STOP taking these medications     levofloxacin (LEVAQUIN) 500 MG tablet      lisinopril (PRINIVIL,ZESTRIL) 20 MG tablet        Allergies  Allergen Reactions  . Penicillins Anaphylaxis  . Lyrica [Pregabalin] Swelling  . Rocephin [Ceftriaxone Sodium In Dextrose]     Rash/hives/itching  . Azithromycin Hives and Rash  . Bactrim [Sulfamethoxazole-Trimethoprim] Hives and Rash  . Catapres [Clonidine Hcl] Itching and Rash  . Clonidine Derivatives Itching and Rash  . Sulfa Antibiotics Hives and Rash      The results of significant diagnostics from this hospitalization (including imaging, microbiology, ancillary and laboratory) are listed below for reference.    Significant Diagnostic Studies: Dg Chest Port 1 View  04/03/2015  CLINICAL DATA:  Congestive heart failure, cough and congestion Pt has has SOB, and is anemic. Hx of having a stroke 3 years ago EXAM: PORTABLE CHEST 1 VIEW COMPARISON:  06/01/2014 FINDINGS: There changes from prior CABG surgery,  stable. There is stable mild to moderate cardiomegaly. No mediastinal or hilar masses or convincing adenopathy. Mild stable scarring at the left lung base. Lungs otherwise clear. No pleural effusion or pneumothorax. Bony thorax is intact. IMPRESSION: No acute cardiopulmonary disease. Electronically Signed   By: Lajean Manes M.D.   On: 04/03/2015 11:57    Microbiology: Recent Results (from the past 240 hour(s))  Culture, sputum-assessment     Status: None   Collection Time: 04/03/15  8:50 AM  Result Value Ref Range Status   Specimen  Description SPUTUM  Final   Special Requests NONE  Final   Sputum evaluation   Final    THIS SPECIMEN IS ACCEPTABLE. RESPIRATORY CULTURE REPORT TO FOLLOW.   Report Status 04/03/2015 FINAL  Final  Culture, respiratory (NON-Expectorated)     Status: None   Collection Time: 04/03/15  8:50 AM  Result Value Ref Range Status   Specimen Description SPUTUM  Final   Special Requests NONE  Final   Gram Stain   Final    FEW WBC PRESENT,BOTH PMN AND MONONUCLEAR FEW SQUAMOUS EPITHELIAL CELLS PRESENT NO ORGANISMS SEEN Performed at [facility]    Culture   Final    NORMAL OROPHARYNGEAL FLORA Performed at [facility]    Report Status 04/05/2015 FINAL  Final     Labs: Basic Metabolic Panel:  Recent Labs Lab 04/05/15 0345 04/07/15 0400 04/08/15 0239 04/09/15 0338 04/10/15 0434  NA 139 137 137 137 139  K 4.2 4.4 4.2 4.5 4.6  CL 98* 97* 97* 95* 95*  CO2 29 30 31 30  35*  GLUCOSE 96 114* 125* 105* 106*  BUN 48* 42* 45* 40* 43*  CREATININE 1.86* 1.74* 1.89* 1.71* 1.89*  CALCIUM 8.5* 8.7* 8.6* 8.7* 8.6*   Liver Function Tests: No results for input(s): AST, ALT, ALKPHOS, BILITOT, PROT, ALBUMIN in the last 168 hours. No results for input(s): LIPASE, AMYLASE in the last 168 hours. No results for input(s): AMMONIA in the last 168 hours. CBC:  Recent Labs Lab 04/05/15 0345 04/07/15 0400  WBC 8.2 9.0  NEUTROABS  --  6.1  HGB 8.4* 8.6*   HCT 29.8* 30.5*  MCV 79.9 80.1  PLT 230 221   Cardiac Enzymes: No results for input(s): CKTOTAL, CKMB, CKMBINDEX, TROPONINI in the last 168 hours. BNP: BNP (last 3 results) No results for input(s): BNP in the last 8760 hours.  ProBNP (last 3 results) No results for input(s): PROBNP in the last 8760 hours.  CBG:  Recent Labs Lab 04/09/15 1144 04/09/15 1629 04/09/15 2105 04/10/15 0554 04/10/15 1111  GLUCAP 176* 216* 149* 112* 81       Signed:  Benford Asch A MD.  [facility] 04/10/2015, 1:23 PM

## 2015-04-10 NOTE — Interval H&P Note (Signed)
History and Physical Interval Note:  04/10/2015 9:04 AM  Caitlyn Hardy  has presented today for surgery, with the diagnosis of Positive stool  The various methods of treatment have been discussed with the patient and family. After consideration of risks, benefits and other options for treatment, the patient has consented to  Procedure(s): ESOPHAGOGASTRODUODENOSCOPY (EGD) (N/A) as a surgical intervention .  The patient's history has been reviewed, patient examined, no change in status, stable for surgery.  I have reviewed the patient's chart and labs.  Questions were answered to the patient's satisfaction.     Caitlyn Hardy,Kennya Schwenn L

## 2015-04-10 NOTE — Progress Notes (Signed)
[place] orders faxed to [facility] as requested/ notified of discharge home today; Aneta Mins [phone]

## 2015-04-10 NOTE — Op Note (Signed)
[facility] [place], [address]   ENDOSCOPY PROCEDURE REPORT  PATIENT: Caitlyn, Hardy  MR#: [id number] BIRTHDATE: Dec 08, 1945 , 58  yrs. old GENDER: female ENDOSCOPIST:Tyshana Nishida, MD REFERRED BY: PCP: Dr Lars Pinks in [facility] hospitalist. PROCEDURE DATE:  04/10/2015 PROCEDURE:   EGD and biopsy ASA CLASS:    class IV INDICATIONS: heme positive stools, anemia and woman who has a fib needs to be anticoagulated with multiple medical problems MEDICATION: fentanyl 25 g Versaid 3 mg IV TOPICAL ANESTHETIC:   cetacaine spray  DESCRIPTION OF PROCEDURE:   After the risks and benefits of the procedure were explained, informed consent was obtained.  The PENTAX GASTOROSCOPE [id number]  endoscope was introduced through the mouth  and advanced to the second portion of the duodenum .  The instrument was slowly withdrawn as the mucosa was fully examined. Estimated blood loss is zero unless otherwise noted in this procedure report. there was no active bleeding. The duodenum including the 2nd portion in duodenal bulb was normal. The scope was withdrawn into the stomach. There was a large amount of food material in the stomach despite overnight fast. We were able to move it around and see the majority of the stomach fairly well. The [place] was markedly inflamed with hypertrophic reddening mucosa that was biopsied and it seems somewhat friable. It actually appeared somewhat polypoid. There were no gross ulcerations or masses.the fundus and Cardia were seen well in the retro flex view and appear normal. There was a hiatal hernia in the esophagus was without any lesions.    The scope was then withdrawn from the patient and the procedure completed.  COMPLICATIONS: There were no immediate complications.  ENDOSCOPIC IMPRESSION: 1. Gastritis in antrum with inflamed polypoid mucosa biopsied 2. Gastroparesis RECOMMENDATIONS: we will add carafate to her PPI  therapy and check the results of the antral biopsies   _______________________________ eSigned:  Laurence Spates, MD 04/10/2015 9:28 AM     cc:  Dr Erick Alley in [place]: ICD CODES:  The ICD and CPT codes recommended by this software are interpretations from the data that the clinical staff has captured with the software.  The verification of the translation of this report to the ICD and CPT codes and modifiers is the sole responsibility of the health care institution and practicing physician where this report was generated.  [place]. will not be held responsible for the validity of the ICD and CPT codes included on this report.  AMA assumes no liability for data contained or not contained herein. CPT is a Designer, television/film set of the [employer].  PATIENT NAME:  Caitlyn, Hardy MR#: [id number]

## 2015-04-10 NOTE — Progress Notes (Signed)
EGD reveals gastroparesis gastritis in the antrum. No gross bleeding or ulceration. Biopsies obtained. Patient appears stable and at this point, would go ahead and resume anticoagulation. She has had colonoscopy done apparently within the past year or so up in [place]. She could be discharged on iron therapy and follow-up with her physicians in [place].

## 2015-04-10 NOTE — H&P (View-Only) (Signed)
[facility] PROGRESS NOTE Subjective Pt still w/o good IV was NPO but rescheduled EGD to tomorrow.  Objective: Vital signs in last 24 hours: Temp:  [98.1 F (36.7 C)-98.6 F (37 C)] 98.4 F (36.9 C) (01/09 0348) Pulse Rate:  [57-71] 71 (01/09 0348) Resp:  [18] 18 (01/09 0348) BP: (99-147)/(44-65) 147/65 mmHg (01/09 0348) SpO2:  [94 %-98 %] 97 % (01/09 0755) Weight:  [127.506 kg (281 lb 1.6 oz)] 127.506 kg (281 lb 1.6 oz) (01/09 0348) Last BM Date: 04/07/15  Intake/Output from previous day: 01/08 0701 - 01/09 0700 In: 900 [P.O.:900] Out: 2000 [Urine:2000] Intake/Output this shift: Total I/O In: 0  Out: 650 [Urine:650]  PE: General-- obese white female in acute distress   Lungs-- clear Abdomen-- soft and nontender  Lab Results:  Recent Labs  04/07/15 0400  WBC 9.0  HGB 8.6*  HCT 30.5*  PLT 221   BMET  Recent Labs  04/07/15 0400 04/08/15 0239 04/09/15 0338  NA 137 137 137  K 4.4 4.2 4.5  CL 97* 97* 95*  CO2 30 31 30   CREATININE 1.74* 1.89* 1.71*   LFT No results for input(s): PROT, AST, ALT, ALKPHOS, BILITOT, BILIDIR, IBILI in the last 72 hours. PT/INR No results for input(s): LABPROT, INR in the last 72 hours. PANCREAS No results for input(s): LIPASE in the last 72 hours.       Studies/Results: No results found.  Medications: I have reviewed the patient's current medications.  Assessment/Plan: 1. Stool positive for FOB. Patient has been on anticoagulation for chronic atrial fib. Due toIV access problems, we still have not as of yet been able to perform EGD. Per note from Dr. Amedeo Plenty, patient has had EGD and colonoscopy within the past 2 years done another location but I think it would be reasonable to go ahead with EGD now. Will plan on trying to go ahead with this 9 o'clock in the morning. Hopefully will be able to obtain better IV access.   Crystelle Ferrufino JR,Burnie Hank L 04/09/2015, 11:00 AM  Pager: [phone] If no answer or after hours  call [phone]

## 2015-04-10 NOTE — Progress Notes (Signed)
ANTICOAGULATION CONSULT NOTE - Initial Consult  Pharmacy Consult for pradaxa Indication: atrial fibrillation  Allergies  Allergen Reactions  . Penicillins Anaphylaxis  . Lyrica [Pregabalin] Swelling  . Rocephin [Ceftriaxone Sodium In Dextrose]     Rash/hives/itching  . Azithromycin Hives and Rash  . Bactrim [Sulfamethoxazole-Trimethoprim] Hives and Rash  . Catapres [Clonidine Hcl] Itching and Rash  . Clonidine Derivatives Itching and Rash  . Sulfa Antibiotics Hives and Rash    Patient Measurements: Height: 5\' 6"  (167.6 cm) Weight: 280 lb 3.3 oz (127.1 kg) (scale b) IBW/kg (Calculated) : 59.3  Vital Signs: Temp: 98.2 F (36.8 C) (01/10 0930) Temp Source: Oral (01/10 0930) BP: 113/47 mmHg (01/10 1100) Pulse Rate: 60 (01/10 1100)  Labs:  Recent Labs  04/08/15 0239 04/09/15 0338 04/10/15 0434  CREATININE 1.89* 1.71* 1.89*    Estimated Creatinine Clearance: 38.3 mL/min (by C-G formula based on Cr of 1.89).   Medical History: Past Medical History  Diagnosis Date  . CAD (coronary artery disease)     a. CABG 2001. b. PCI 2011. c. NSTEMI 12/13-03/2012 in prolonged hosp stay, felt demand isch with stable cath.  . Hypertension   . Persistent atrial fibrillation ([place])     a. Diagnosed 02/2012 - long hosp stay, difficult to control rates, LAA thrombus noted, needed emergent DCCV for VT/afib/hypotension 04/14/2012.  . Depression   . COPD (chronic obstructive pulmonary disease) ([place])   . Diabetes mellitus ([place])   . Chronic systolic CHF (congestive heart failure) ([place])     a. EF 40% 2011, down to 25% 03/2012 felt mixed isch/nonischemic (tachy-mediated).  . Chronic kidney disease   . H/O hiatal hernia   . Headache(784.0)   . Arthritis   . Anemia   . Thrombus of left atrial appendage     a. Possible LAA thrombus on TEE 04/01/12.  . Morbid obesity ([place])   . On home oxygen therapy   . Thrombocytopenia ([place])   . Restless leg   . CVA (cerebral infarction)     Followed by Dr.  Leonie Man  . Hx of amiodarone therapy     Atrial fibrillation  . HX: anticoagulation     Atrial fibrillation  . Cardiomyopathy, ischemic   . Ejection fraction     EF 40% in the past   //    EF 25% January, 2014   //   EF 30%, echo, Aug 11, 2012, mid/apical anterior akinesis and anteroseptal akinesis and apical lateral akinesis and akinesis of the true apex.  . Pulmonary hypertension ([place])     PA pressure 65 mmHg, echo, Aug 11, 2012  . Sweating     May, 2014  . Dizziness   . Complication of anesthesia   . Myocardial infarction [facility])     [phone] . 2011 2014  . Stroke [facility])     a. Stroke 02/2012 with recurrent CVA in-hospital [id number] felt embolic.  . Anginal pain ([place])   . Sleep apnea     CPAP qhs  . Shortness of breath dyspnea   . Asthma   . Pneumonia     2014  . Cancer [facility])     hysterectomy 1979 r/t/ cancer   Assessment: 79 yof s/p EGD with was reported with gastroparesis gastritis. She is on chronic pradaxa for history of afib. H/H slightly low and platelets WNL as of 1/7. GI ok with resuming anticoagulation. Scr is elevated but full dose of pradaxa is still appropriate.   Goal of Therapy:  Therapeutic anticoagulation and stroke  prevention Monitor platelets by anticoagulation protocol: Yes   Plan:  - Pradaxa 150mg  PO BID - F/u S&S of bleeding, renal fxn - CBC in AM  Decklin Weddington, Rande Lawman 04/10/2015,1:22 PM

## 2015-04-10 NOTE — Progress Notes (Signed)
D/c instructions dicussed with patients. All questions answered and pt verbalized understanding. Pt d/c via [place].

## 2015-04-11 ENCOUNTER — Encounter (HOSPITAL_COMMUNITY): Payer: Self-pay | Admitting: Gastroenterology

## 2015-04-16 ENCOUNTER — Other Ambulatory Visit: Payer: Self-pay | Admitting: Cardiology

## 2015-04-19 ENCOUNTER — Telehealth: Payer: Self-pay | Admitting: Cardiovascular Disease

## 2015-04-19 NOTE — Telephone Encounter (Signed)
Caitlyn Hardy had appointment for 04-20-15. Per Dr. Bronson Ing note I didn't see in d/c summary to f/u with [place] only to f/u with pcp. Spoke with Caitlyn Hardy.  She states that she has appointment with her PCP on 04-27-15. She chose to re-schedule her appointment For May of 2017.

## 2015-04-20 ENCOUNTER — Encounter: Payer: Medicare Other | Admitting: Cardiovascular Disease

## 2015-04-25 ENCOUNTER — Other Ambulatory Visit: Payer: Self-pay | Admitting: Cardiology

## 2015-04-25 MED ORDER — AMIODARONE HCL 200 MG PO TABS: 200.0000 mg | ORAL_TABLET | Freq: Every day | ORAL | Status: DC

## 2015-05-25 ENCOUNTER — Other Ambulatory Visit: Payer: Self-pay | Admitting: Cardiology

## 2015-06-21 ENCOUNTER — Other Ambulatory Visit: Payer: Self-pay

## 2015-06-22 ENCOUNTER — Other Ambulatory Visit: Payer: Self-pay | Admitting: Cardiology

## 2015-08-29 ENCOUNTER — Ambulatory Visit: Payer: Medicare Other | Admitting: Cardiovascular Disease

## 2015-08-29 ENCOUNTER — Encounter: Payer: Self-pay | Admitting: Cardiovascular Disease

## 2015-09-09 ENCOUNTER — Other Ambulatory Visit: Payer: Self-pay | Admitting: Cardiovascular Disease

## 2015-09-26 ENCOUNTER — Encounter: Payer: Self-pay | Admitting: Cardiovascular Disease

## 2015-09-26 ENCOUNTER — Ambulatory Visit (INDEPENDENT_AMBULATORY_CARE_PROVIDER_SITE_OTHER): Payer: Medicare Other | Admitting: Cardiovascular Disease

## 2015-09-26 VITALS — BP 144/70 | HR 73 | Ht 66.0 in | Wt 279.0 lb

## 2015-09-26 DIAGNOSIS — I209 Angina pectoris, unspecified: Secondary | ICD-10-CM

## 2015-09-26 DIAGNOSIS — I480 Paroxysmal atrial fibrillation: Secondary | ICD-10-CM

## 2015-09-26 DIAGNOSIS — I10 Essential (primary) hypertension: Secondary | ICD-10-CM | POA: Diagnosis not present

## 2015-09-26 DIAGNOSIS — Z9981 Dependence on supplemental oxygen: Secondary | ICD-10-CM

## 2015-09-26 DIAGNOSIS — I25708 Atherosclerosis of coronary artery bypass graft(s), unspecified, with other forms of angina pectoris: Secondary | ICD-10-CM

## 2015-09-26 DIAGNOSIS — R001 Bradycardia, unspecified: Secondary | ICD-10-CM

## 2015-09-26 DIAGNOSIS — Z79899 Other long term (current) drug therapy: Secondary | ICD-10-CM

## 2015-09-26 DIAGNOSIS — I5022 Chronic systolic (congestive) heart failure: Secondary | ICD-10-CM

## 2015-09-26 DIAGNOSIS — Z7901 Long term (current) use of anticoagulants: Secondary | ICD-10-CM

## 2015-09-26 NOTE — Patient Instructions (Signed)

## 2015-09-26 NOTE — Progress Notes (Signed)
Patient ID: Caitlyn Hardy, female   DOB: September 26, 1945, 70 y.o.   MRN: [id number]      SUBJECTIVE: She has a history of stroke, chronic systolic heart failure, ventricular tachycardia, coronary artery disease and CABG , paroxysmal atrial fibrillation (I performed cardioversion on 06/02/14), pulmonary hypertension, morbid obesity, and diabetes. Also has leg swelling deemed secondary to venous insufficiency.  She is not on beta blockers due to a history of bradycardia, but is taking amiodarone.  However, echocardiogram 03/30/15 showed mildly reduced left reticular systolic function, [id number] [id number], wall motion abnormalities, moderate LVH, and moderate mitral regurgitation with severe pulmonary hypertension.  Coronary angiography on 03/30/12 showed patency of the saphenous vein graft to PDA, saphenous vein graft to diagonal, and LIMA to LAD. There was continued patency of the stented segment in the left circumflex coronary artery.  She was hospitalized for acute on chronic respiratory failure in January 2017 due to acute bronchitis and a CHF exacerbation and anemia.  She says she was hospitalized at [facility] in May for a UTI but I do not have these records. She required a blood transfusion in May as well. She is scheduled to see a hematologist in [place] on 09/17/15.  Chest pain has been controlled with the addition of Imdur at her last visit with me. She denies worsening shortness of breath from baseline. She now takes torsemide 40 mg twice daily to alleviate leg swelling.   Review of Systems: As per "subjective", otherwise negative.  Allergies  Allergen Reactions  . Penicillins Anaphylaxis  . Lyrica [Pregabalin] Swelling  . Rocephin [Ceftriaxone Sodium In Dextrose]     Rash/hives/itching  . Azithromycin Hives and Rash  . Bactrim [Sulfamethoxazole-Trimethoprim] Hives and Rash  . Catapres [Clonidine Hcl] Itching and Rash  . Clonidine Derivatives Itching and Rash  . Sulfa  Antibiotics Hives and Rash    Current Outpatient Prescriptions  Medication Sig Dispense Refill  . albuterol (PROVENTIL HFA;VENTOLIN HFA) 108 (90 BASE) MCG/ACT inhaler Inhale 1-2 puffs into the lungs every 6 (six) hours as needed for wheezing or shortness of breath.    Marland Kitchen amiodarone (PACERONE) 200 MG tablet Take 1 tablet (200 mg total) by mouth daily. 90 tablet 3  . atorvastatin (LIPITOR) 10 MG tablet Take 1 tablet (10 mg total) by mouth daily. 30 tablet 2  . busPIRone (BUSPAR) 5 MG tablet Take 5 mg by mouth 2 (two) times daily.    . DULoxetine (CYMBALTA) 30 MG capsule Take 30 mg by mouth daily.    . Fe Fum-FA-B Cmp-C-Zn-Mg-Mn-Cu (HEMATINIC PLUS COMPLEX) 106-1 MG TABS Take 1 tablet by mouth daily. 30 each 0  . fluticasone (FLONASE) 50 MCG/ACT nasal spray Place 2 sprays into the nose daily.    . Fluticasone-Salmeterol (ADVAIR) 250-50 MCG/DOSE AEPB Inhale 1 puff into the lungs every 12 (twelve) hours.    Marland Kitchen HYDROcodone-acetaminophen (NORCO) 10-325 MG per tablet Take 1 tablet by mouth every 6 (six) hours as needed for pain.    Marland Kitchen ipratropium-albuterol (DUONEB) 0.5-2.5 (3) MG/3ML SOLN Take 3 mLs by nebulization every 6 (six) hours as needed (wheezing).     . isosorbide mononitrate (IMDUR) 30 MG 24 hr tablet Take 1 tablet (30 mg total) by mouth daily. 30 tablet 6  . lidocaine (LIDODERM) 5 % PLACE 1 PATCH ONTO THE SKIN EVERY DAY. REMOVE AND DISCARD PATCH WITHIN 12 HOURS OR AS DIRECTED BY MD 30 patch 0  . lisinopril (PRINIVIL,ZESTRIL) 20 MG tablet TAKE 1 TABLET BY MOUTH EVERY DAY 90 tablet 3  .  montelukast (SINGULAIR) 10 MG tablet Take 1 tablet (10 mg total) by mouth at bedtime. 30 tablet 0  . NITROSTAT 0.4 MG SL tablet PLACE 1 TABLET UNDER TONGUE EVERY 5 MINUTES FOR 3 DOSES AS NEEDED FOR CHEST PAIN 100 tablet 0  . NON FORMULARY CPAP nightly    . NON FORMULARY Oxygen at 2L/min while out, prn other times.    Marland Kitchen NOVOLOG FLEXPEN 100 UNIT/ML FlexPen Inject 0-15 Units into the skin 3 (three) times daily. 0-15  units per sliding scale up to three times daily  5  . omeprazole (PRILOSEC) 40 MG capsule Take 1 capsule (40 mg total) by mouth 2 (two) times daily. 180 capsule 3  . potassium chloride SA (K-DUR,KLOR-CON) 20 MEQ tablet Take 2 tablets (40 mEq total) by mouth 2 (two) times daily. 120 tablet 2  . PRADAXA 150 MG CAPS capsule TAKE 1 CAPSULE BY MOUTH EVERY 12 HOURS 60 capsule 6  . rOPINIRole (REQUIP) 2 MG tablet Take 2 mg by mouth at bedtime.    . sertraline (ZOLOFT) 50 MG tablet Take 50 mg by mouth daily.    . sucralfate (CARAFATE) 1 g tablet Take 1 tablet (1 g total) by mouth 4 (four) times daily -  with meals and at bedtime. 120 tablet 0  . temazepam (RESTORIL) 15 MG capsule Take 15 mg by mouth at bedtime as needed for sleep.    Marland Kitchen tiotropium (SPIRIVA) 18 MCG inhalation capsule Place 18 mcg into inhaler and inhale daily.    Marland Kitchen torsemide (DEMADEX) 20 MG tablet Take 20 mg by mouth 2 (two) times daily. Dose decrease 05/18/14     No current facility-administered medications for this visit.    Past Medical History  Diagnosis Date  . CAD (coronary artery disease)     a. CABG 2001. b. PCI 2011. c. NSTEMI 12/13-03/2012 in prolonged hosp stay, felt demand isch with stable cath.  . Hypertension   . Persistent atrial fibrillation ([place])     a. Diagnosed 02/2012 - long hosp stay, difficult to control rates, LAA thrombus noted, needed emergent DCCV for VT/afib/hypotension 04/14/2012.  . Depression   . COPD (chronic obstructive pulmonary disease) ([place])   . Diabetes mellitus ([place])   . Chronic systolic CHF (congestive heart failure) ([place])     a. EF 40% 2011, down to 25% 03/2012 felt mixed isch/nonischemic (tachy-mediated).  . Chronic kidney disease   . H/O hiatal hernia   . Headache(784.0)   . Arthritis   . Anemia   . Thrombus of left atrial appendage     a. Possible LAA thrombus on TEE 04/01/12.  . Morbid obesity ([place])   . On home oxygen therapy   . Thrombocytopenia ([place])   . Restless leg   . CVA  (cerebral infarction)     Followed by Dr. Leonie Man  . Hx of amiodarone therapy     Atrial fibrillation  . HX: anticoagulation     Atrial fibrillation  . Cardiomyopathy, ischemic   . Ejection fraction     EF 40% in the past   //    EF 25% January, 2014   //   EF 30%, echo, Aug 11, 2012, mid/apical anterior akinesis and anteroseptal akinesis and apical lateral akinesis and akinesis of the true apex.  . Pulmonary hypertension ([place])     PA pressure 65 mmHg, echo, Aug 11, 2012  . Sweating     May, 2014  . Dizziness   . Complication of anesthesia   . Myocardial infarction (  [place])     [phone] . 2011 2014  . Stroke [facility])     a. Stroke 02/2012 with recurrent CVA in-hospital [id number] felt embolic.  . Anginal pain ([place])   . Sleep apnea     CPAP qhs  . Shortness of breath dyspnea   . Asthma   . Pneumonia     2014  . Cancer [facility])     hysterectomy 1979 r/t/ cancer    Past Surgical History  Procedure Laterality Date  . Coronary artery bypass graft  2001    x 4 vessels  . Abdominal hysterectomy    . ;    . Rhinosplasty      x 2  . Tonsillectomy    . Knee replacemnt  2000    total right  . Shoulder sx  2000    right bone spurs  . Cardiac catheterization  2013  . Tee without cardioversion  04/01/2012    Procedure: TRANSESOPHAGEAL ECHOCARDIOGRAM (TEE);  Surgeon: Thayer Headings, MD;  Location: [place];  Service: Cardiovascular;  Laterality: N/A;  . Left and right heart catheterization with coronary/graft angiogram N/A 03/30/2012    Procedure: LEFT AND RIGHT HEART CATHETERIZATION WITH Beatrix Fetters;  Surgeon: Sherren Mocha, MD;  Location: [facility] CATH LAB;  Service: Cardiovascular;  Laterality: N/A;  . Cardioversion N/A 06/02/2014    Procedure: CARDIOVERSION;  Surgeon: Herminio Commons, MD;  Location: [facility] ORS;  Service: Endoscopy;  Laterality: N/A;  . Esophagogastroduodenoscopy N/A 04/10/2015    Procedure: ESOPHAGOGASTRODUODENOSCOPY (EGD);  Surgeon: Laurence Spates, MD;   Location: [facility] ENDOSCOPY;  Service: Endoscopy;  Laterality: N/A;    Social History   Social History  . Marital Status: Single    Spouse Name: N/A  . Number of Children: 4  . Years of Education: GED   Occupational History  . Not on file.   Social History Main Topics  . Smoking status: Former Smoker    Types: Cigarettes    Start date: 08/15/1963    Quit date: 04/01/1975  . Smokeless tobacco: Never Used  . Alcohol Use: No  . Drug Use: No  . Sexual Activity: Not on file   Other Topics Concern  . Not on file   Social History Narrative   Patient is single with 4 children   Patient has a GED   Patient is right handed   Patient drinks 2 cups daily     Filed Vitals:   09/26/15 1416  BP: 144/70  Pulse: 73  Height: 5\' 6"  (1.676 m)  Weight: 279 lb (126.554 kg)  SpO2: 99%    PHYSICAL EXAM General: NAD, using oxygen by nasal cannula. HEENT: Normal. Neck: No JVD, no thyromegaly. Lungs: Diminished b/l, no rales. CV: Regular rate and rhythm, normal S1/S2, no S3/S4, no murmur. Trace pretibial and periankle edema b/l.  Abdomen: Obese.  Neurologic: Alert and oriented x 3.  Psych: Normal affect. Skin: Normal. Musculoskeletal: No gross deformities. Extremities: No clubbing or cyanosis.    ECG: Most recent ECG reviewed.      ASSESSMENT AND PLAN: 1. Chest pain in context of CAD with CABG (2001): Controlled with Imdur 30 mg. Patent grafts by last cath as noted above. Continue Lipitor and lisinopril. Not on ASA, presumably due to Pradaxa use.  2. Chronic systolic heart failure: Euvolemic on torsemide 20 mg bid. No changes.  3. CVA: Anticoagulated with Pradaxa. Not on ASA.  4. Paroxysmal atrial fibrillation: Remains on amiodarone and Pradaxa. No changes.  5. Bradycardia: HR 73 bpm.  6. Essential HTN: Mildly elevated. No changes.  Dispo: f/u 6 months.   Kate Sable, M.D., F.A.C.C.

## 2015-09-30 ENCOUNTER — Other Ambulatory Visit: Payer: Self-pay | Admitting: Cardiovascular Disease

## 2015-10-04 ENCOUNTER — Other Ambulatory Visit: Payer: Self-pay | Admitting: Cardiovascular Disease

## 2015-10-19 ENCOUNTER — Other Ambulatory Visit: Payer: Self-pay | Admitting: Cardiology

## 2015-12-25 ENCOUNTER — Other Ambulatory Visit: Payer: Self-pay | Admitting: Cardiovascular Disease

## 2016-03-25 ENCOUNTER — Ambulatory Visit: Payer: Medicare Other | Admitting: Cardiology

## 2016-03-28 ENCOUNTER — Other Ambulatory Visit: Payer: Self-pay | Admitting: Cardiovascular Disease

## 2016-04-11 ENCOUNTER — Encounter: Payer: Self-pay | Admitting: *Deleted

## 2016-04-14 ENCOUNTER — Ambulatory Visit: Payer: Medicare Other | Admitting: Cardiovascular Disease

## 2016-04-14 ENCOUNTER — Encounter: Payer: Self-pay | Admitting: *Deleted

## 2016-05-09 ENCOUNTER — Other Ambulatory Visit: Payer: Self-pay | Admitting: *Deleted

## 2016-05-09 MED ORDER — DABIGATRAN ETEXILATE MESYLATE 150 MG PO CAPS: 150.0000 mg | ORAL_CAPSULE | Freq: Two times a day (BID) | ORAL | 6 refills | Status: DC

## 2016-05-12 ENCOUNTER — Ambulatory Visit (INDEPENDENT_AMBULATORY_CARE_PROVIDER_SITE_OTHER): Payer: Medicare Other | Admitting: Cardiovascular Disease

## 2016-05-12 ENCOUNTER — Encounter: Payer: Self-pay | Admitting: Cardiovascular Disease

## 2016-05-12 VITALS — BP 140/68 | HR 63 | Ht 66.0 in | Wt 275.0 lb

## 2016-05-12 DIAGNOSIS — I5022 Chronic systolic (congestive) heart failure: Secondary | ICD-10-CM

## 2016-05-12 DIAGNOSIS — I10 Essential (primary) hypertension: Secondary | ICD-10-CM | POA: Diagnosis not present

## 2016-05-12 DIAGNOSIS — Z79899 Other long term (current) drug therapy: Secondary | ICD-10-CM | POA: Diagnosis not present

## 2016-05-12 DIAGNOSIS — R001 Bradycardia, unspecified: Secondary | ICD-10-CM | POA: Diagnosis not present

## 2016-05-12 DIAGNOSIS — I480 Paroxysmal atrial fibrillation: Secondary | ICD-10-CM

## 2016-05-12 DIAGNOSIS — I25708 Atherosclerosis of coronary artery bypass graft(s), unspecified, with other forms of angina pectoris: Secondary | ICD-10-CM | POA: Diagnosis not present

## 2016-05-12 NOTE — Progress Notes (Addendum)
SUBJECTIVE: The patient presents for routine follow-up. She has a history of stroke, chronic systolic heart failure, ventricular tachycardia, coronary artery disease and CABG , paroxysmal atrial fibrillation (I performed cardioversion on 06/02/14), pulmonary hypertension, morbid obesity, and diabetes. Also has leg swelling deemed secondary to venous insufficiency.  She is not on beta blockers due to a history of bradycardia, but is taking amiodarone.  Echocardiogram 03/30/15 showed mildly reduced left ventricular systolic function, [id number] [id number], wall motion abnormalities, moderate LVH, and moderate mitral regurgitation with severe pulmonary hypertension.  Coronary angiography on 03/30/12 showed patency of the saphenous vein graft to PDA, saphenous vein graft to diagonal, and LIMA to LAD. There was continued patency of the stented segment in the left circumflex coronary artery.  She was evaluated in the ED in [place] (as per patient) for acute bronchitis in early January and was given a three-week course of antibiotics. She currently denies chest pain. She says she has stage III chronic in disease and follows up with nephrology. She now takes ferrous gluconate for anemia and was evaluated by hematology. She denies leg swelling.   Review of Systems: As per "subjective", otherwise negative.  Allergies  Allergen Reactions  . Penicillins Anaphylaxis  . Lyrica [Pregabalin] Swelling  . Rocephin [Ceftriaxone Sodium In Dextrose]     Rash/hives/itching  . Azithromycin Hives and Rash  . Bactrim [Sulfamethoxazole-Trimethoprim] Hives and Rash  . Catapres [Clonidine Hcl] Itching and Rash  . Clonidine Derivatives Itching and Rash  . Sulfa Antibiotics Hives and Rash    Current Outpatient Prescriptions  Medication Sig Dispense Refill  . albuterol (PROVENTIL HFA;VENTOLIN HFA) 108 (90 BASE) MCG/ACT inhaler Inhale 1-2 puffs into the lungs every 6 (six) hours as needed for wheezing or shortness of  breath.    Marland Kitchen amiodarone (PACERONE) 200 MG tablet TAKE 1 TABLET BY MOUTH DAILY 90 tablet 0  . AMITIZA 24 MCG capsule Take 1 capsule by mouth daily.  11  . atorvastatin (LIPITOR) 10 MG tablet Take 1 tablet (10 mg total) by mouth daily. 30 tablet 2  . azelastine (ASTELIN) 0.1 % nasal spray Place 1 spray into both nostrils daily.  12  . busPIRone (BUSPAR) 5 MG tablet Take 5 mg by mouth 2 (two) times daily.    . dabigatran (PRADAXA) 150 MG CAPS capsule Take 1 capsule (150 mg total) by mouth 2 (two) times daily. 60 capsule 6  . DULoxetine (CYMBALTA) 30 MG capsule Take 60 mg by mouth daily.     . ferrous gluconate (FERGON) 325 MG tablet Take 1 tablet by mouth daily.  11  . fluticasone (FLONASE) 50 MCG/ACT nasal spray Place 2 sprays into the nose daily.    . Fluticasone-Salmeterol (ADVAIR) 250-50 MCG/DOSE AEPB Inhale 1 puff into the lungs every 12 (twelve) hours.    Marland Kitchen HYDROcodone-acetaminophen (NORCO) 10-325 MG per tablet Take 1 tablet by mouth every 6 (six) hours as needed for pain.    Marland Kitchen ipratropium-albuterol (DUONEB) 0.5-2.5 (3) MG/3ML SOLN Take 3 mLs by nebulization every 6 (six) hours as needed (wheezing).     . isosorbide mononitrate (IMDUR) 30 MG 24 hr tablet TAKE 1 TABLET BY MOUTH DAILY 90 tablet 2  . lidocaine (LIDODERM) 5 % PLACE 1 PATCH ONTO THE SKIN EVERY DAY. REMOVE AND DISCARD PATCH WITHIN 12 HOURS OR AS DIRECTED BY MD 30 patch 0  . lisinopril (PRINIVIL,ZESTRIL) 20 MG tablet TAKE 1 TABLET BY MOUTH EVERY DAY 90 tablet 3  . meclizine (ANTIVERT) 25 MG tablet Take  1 tablet by mouth daily.    . montelukast (SINGULAIR) 10 MG tablet Take 1 tablet (10 mg total) by mouth at bedtime. 30 tablet 0  . NITROSTAT 0.4 MG SL tablet PLACE 1 TABLET UNDER TONGUE EVERY 5 MINUTES FOR 3 DOSES AS NEEDED FOR CHEST PAIN 100 tablet 0  . NON FORMULARY CPAP nightly    . NON FORMULARY Oxygen at 2L/min while out, prn other times.    Marland Kitchen NOVOLOG FLEXPEN 100 UNIT/ML FlexPen Inject 0-15 Units into the skin 3 (three) times  daily. 0-15 units per sliding scale up to three times daily  5  . omeprazole (PRILOSEC) 40 MG capsule Take 1 capsule (40 mg total) by mouth 2 (two) times daily. 180 capsule 3  . potassium chloride SA (K-DUR,KLOR-CON) 20 MEQ tablet Take 2 tablets (40 mEq total) by mouth 2 (two) times daily. 120 tablet 2  . rOPINIRole (REQUIP) 2 MG tablet Take 2 mg by mouth at bedtime.    . temazepam (RESTORIL) 15 MG capsule Take 15 mg by mouth at bedtime as needed for sleep.    Marland Kitchen tiotropium (SPIRIVA) 18 MCG inhalation capsule Place 18 mcg into inhaler and inhale daily.    Marland Kitchen torsemide (DEMADEX) 20 MG tablet Take 40 mg by mouth 2 (two) times daily. Dose decrease 05/18/14     No current facility-administered medications for this visit.     Past Medical History:  Diagnosis Date  . Anemia   . Anginal pain ([place])   . Arthritis   . Asthma   . CAD (coronary artery disease)    a. CABG 2001. b. PCI 2011. c. NSTEMI 12/13-03/2012 in prolonged hosp stay, felt demand isch with stable cath.  . Cancer [facility])    hysterectomy 1979 r/t/ cancer  . Cardiomyopathy, ischemic   . Chronic kidney disease   . Chronic systolic CHF (congestive heart failure) ([place])    a. EF 40% 2011, down to 25% 03/2012 felt mixed isch/nonischemic (tachy-mediated).  . Complication of anesthesia   . COPD (chronic obstructive pulmonary disease) ([place])   . CVA (cerebral infarction)    Followed by Dr. Leonie Man  . Depression   . Diabetes mellitus ([place])   . Dizziness   . Ejection fraction    EF 40% in the past   //    EF 25% January, 2014   //   EF 30%, echo, Aug 11, 2012, mid/apical anterior akinesis and anteroseptal akinesis and apical lateral akinesis and akinesis of the true apex.  . H/O hiatal hernia   . Headache(784.0)   . Hx of amiodarone therapy    Atrial fibrillation  . HX: anticoagulation    Atrial fibrillation  . Hypertension   . Morbid obesity ([place])   . Myocardial infarction    [phone] . 2011 2014  . On home oxygen therapy     . Persistent atrial fibrillation ([place])    a. Diagnosed 02/2012 - long hosp stay, difficult to control rates, LAA thrombus noted, needed emergent DCCV for VT/afib/hypotension 04/14/2012.  Marland Kitchen Pneumonia    2014  . Pulmonary hypertension    PA pressure 65 mmHg, echo, Aug 11, 2012  . Restless leg   . Shortness of breath dyspnea   . Sleep apnea    CPAP qhs  . Stroke [facility])    a. Stroke 02/2012 with recurrent CVA in-hospital [id number] felt embolic.  Marland Kitchen Sweating    May, 2014  . Thrombocytopenia ([place])   . Thrombus of left atrial appendage    a.  Possible LAA thrombus on TEE 04/01/12.    Past Surgical History:  Procedure Laterality Date  . ;    . ABDOMINAL HYSTERECTOMY    . CARDIAC CATHETERIZATION  2013  . CARDIOVERSION N/A 06/02/2014   Procedure: CARDIOVERSION;  Surgeon: Herminio Commons, MD;  Location: [facility];  Service: Endoscopy;  Laterality: N/A;  . CORONARY ARTERY BYPASS GRAFT  2001   x 4 vessels  . ESOPHAGOGASTRODUODENOSCOPY N/A 04/10/2015   Procedure: ESOPHAGOGASTRODUODENOSCOPY (EGD);  Surgeon: Laurence Spates, MD;  Location: [facility] ENDOSCOPY;  Service: Endoscopy;  Laterality: N/A;  . knee replacemnt  2000   total right  . LEFT AND RIGHT HEART CATHETERIZATION WITH CORONARY/GRAFT ANGIOGRAM N/A 03/30/2012   Procedure: LEFT AND RIGHT HEART CATHETERIZATION WITH Beatrix Fetters;  Surgeon: Sherren Mocha, MD;  Location: [facility] C[facility] LAB;  Service: Cardiovascular;  Laterality: N/A;  . rhinosplasty     x 2  . shoulder sx  2000   right bone spurs  . TEE WITHOUT CARDIOVERSION  04/01/2012   Procedure: TRANSESOPHAGEAL ECHOCARDIOGRAM (TEE);  Surgeon: Thayer Headings, MD;  Location: [place];  Service: Cardiovascular;  Laterality: N/A;  . TONSILLECTOMY      Social History   Social History  . Marital status: Single    Spouse name: N/A  . Number of children: 4  . Years of education: GED   Occupational History  . Not on file.   Social History Main Topics  . Smoking status: Former Smoker     Types: Cigarettes    Start date: 08/15/1963    Quit date: 04/01/1975  . Smokeless tobacco: Never Used  . Alcohol use No  . Drug use: No  . Sexual activity: Not on file   Other Topics Concern  . Not on file   Social History Narrative   Patient is single with 4 children   Patient has a GED   Patient is right handed   Patient drinks 2 cups daily     Vitals:   05/12/16 1518  BP: 140/68  Pulse: 63  SpO2: 96%  Weight: 275 lb (124.7 kg)  Height: 5\' 6"  (1.676 m)    PHYSICAL EXAM General: NAD, using oxygen by nasal cannula. HEENT: Normal. Neck: No JVD, no thyromegaly. Lungs: No wheezes or rales. CV: Regular rate and rhythm, normal S1/S2, no S3/S4, no murmur. No significant pretibial or periankle edema. Abdomen: Obese.  Neurologic: Alert and oriented x 3.  Psych: Normal affect. Skin: Normal. Musculoskeletal: No gross deformities. Extremities: No clubbing or cyanosis.     ECG: Most recent ECG reviewed.      ASSESSMENT AND PLAN: 1. CAD with CABG (2001): Symptomatically stable. Patent grafts by last cath as noted above. Continue Lipitor, Imdur, and lisinopril. Not on ASA, presumably due to Pradaxa use.  2. Chronic systolic heart failure: Euvolemic on torsemide 40 mg bid. No changes.  3. CVA: Anticoagulated with Pradaxa. Not on ASA.  4. Paroxysmal atrial fibrillation: Remains on amiodarone and Pradaxa. No changes. Will check TSH and LFT's.  5. Bradycardia: HR 63 bpm.  6. Essential HTN: Mildly elevated. No changes.  Dispo: f/u 6 months.   Kate Sable, M.D., F.A.C.C.   ADDENDUM: Lipids reviewed from 01/14/16. TC 251, LDL 170. Needs Repatha.

## 2016-05-12 NOTE — Patient Instructions (Signed)
Medication Instructions:  Continue all current medications.  Labwork: If TSH & LFT not done recently, will call you to have done.  Testing/Procedures: none  Follow-Up: Your physician wants you to follow up in: 6 months.  You will receive a reminder letter in the mail one-two months in advance.  If you don't receive a letter, please call our office to schedule the follow up appointment   Any Other Special Instructions Will Be Listed Below (If Applicable).  If you need a refill on your cardiac medications before your next appointment, please call your pharmacy.

## 2016-05-13 ENCOUNTER — Encounter: Payer: Self-pay | Admitting: *Deleted

## 2016-05-20 ENCOUNTER — Encounter: Payer: Self-pay | Admitting: *Deleted

## 2016-06-06 ENCOUNTER — Encounter: Payer: Self-pay | Admitting: *Deleted

## 2016-06-06 ENCOUNTER — Other Ambulatory Visit: Payer: Self-pay | Admitting: *Deleted

## 2016-06-06 DIAGNOSIS — E785 Hyperlipidemia, unspecified: Secondary | ICD-10-CM

## 2016-06-27 ENCOUNTER — Telehealth: Payer: Self-pay | Admitting: *Deleted

## 2016-06-27 ENCOUNTER — Other Ambulatory Visit: Payer: Self-pay | Admitting: Cardiovascular Disease

## 2016-06-27 MED ORDER — ATORVASTATIN CALCIUM 40 MG PO TABS: 40.0000 mg | ORAL_TABLET | Freq: Every day | ORAL | 6 refills | Status: DC

## 2016-06-27 NOTE — Telephone Encounter (Signed)
Notes recorded by Laurine Blazer, LPN on 5/32/0233 at 4:35 PM EDT Patient notified. Will send new prescription to [place], [place]. Will mail lab order when time to repeat. Copy to PMD. ------  Notes recorded by Laurine Blazer, LPN on 6/86/1683 at 7:29 PM EDT Left message to return call.  ------  Notes recorded by Herminio Commons, MD on 06/25/2016 at 12:37 PM EDT Markedly elevated lipids, LDL in particular. Increase Lipitor to 40 mg daily and have lipids repeated in 3 months.

## 2016-08-01 ENCOUNTER — Other Ambulatory Visit: Payer: Self-pay | Admitting: Cardiovascular Disease

## 2016-09-27 ENCOUNTER — Other Ambulatory Visit: Payer: Self-pay | Admitting: Cardiovascular Disease

## 2016-09-29 ENCOUNTER — Encounter: Payer: Self-pay | Admitting: *Deleted

## 2016-09-29 ENCOUNTER — Other Ambulatory Visit: Payer: Self-pay | Admitting: *Deleted

## 2016-09-29 DIAGNOSIS — E785 Hyperlipidemia, unspecified: Secondary | ICD-10-CM

## 2016-10-16 ENCOUNTER — Encounter: Payer: Self-pay | Admitting: *Deleted

## 2016-11-04 ENCOUNTER — Other Ambulatory Visit: Payer: Self-pay | Admitting: Cardiovascular Disease

## 2016-11-30 ENCOUNTER — Other Ambulatory Visit: Payer: Self-pay | Admitting: Cardiovascular Disease

## 2016-12-29 ENCOUNTER — Other Ambulatory Visit: Payer: Self-pay | Admitting: Cardiovascular Disease

## 2017-01-01 ENCOUNTER — Other Ambulatory Visit: Payer: Self-pay | Admitting: Cardiovascular Disease

## 2017-01-22 ENCOUNTER — Encounter: Payer: Self-pay | Admitting: *Deleted

## 2017-09-14 ENCOUNTER — Other Ambulatory Visit: Payer: Self-pay | Admitting: Cardiovascular Disease

## 2017-10-26 ENCOUNTER — Encounter: Payer: Self-pay | Admitting: *Deleted

## 2017-10-26 NOTE — Progress Notes (Deleted)
Cardiology Office Note    Date:  10/26/2017   ID:  Caitlyn Hardy, DOB [birth year], MRN [id number]  PCP:  [name], MD  Cardiologist: Kate Sable, MD    No chief complaint on file.   History of Present Illness:    Caitlyn Hardy is a 72 y.o. female with past medical history of CAD (s/p CABG in 2001, s/p PCI of LCx in 2011, patent grafts by cath in 02/2012, chronic combined systolic and diastolic CHF, PAF (on Pradaxa), HTN, and HLD who presents to the office today for overdue annual follow-up.  She was last examined by Dr. Bronson Ing in 05/2016 and denied any recent chest discomfort. She had recently been treated in the ED for acute bronchitis and reported her respiratory status was improving at that time.  She was continued on her current medication regimen including Amiodarone 200 mg daily, Atorvastatin 10 mg daily, Pradaxa 150 mg twice daily, Imdur 30 mg daily, Lisinopril 20 mg daily, and Torsemide 40 mg twice daily.  She has not been on beta-blocker therapy given baseline bradycardia.    Past Medical History:  Diagnosis Date  . Anemia   . Anginal pain ([place])   . Arthritis   . Asthma   . CAD (coronary artery disease)    a. CABG 2001. b. PCI 2011. c. NSTEMI 12/13-03/2012 in prolonged hosp stay, felt demand isch with stable cath.  . Cancer [facility])    hysterectomy 1979 r/t/ cancer  . Cardiomyopathy, ischemic   . Chronic kidney disease   . Chronic systolic CHF (congestive heart failure) ([place])    a. EF 40% 2011, down to 25% 03/2012 felt mixed isch/nonischemic (tachy-mediated).  . Complication of anesthesia   . COPD (chronic obstructive pulmonary disease) ([place])   . CVA (cerebral infarction)    Followed by Dr. Leonie Man  . Depression   . Diabetes mellitus ([place])   . Dizziness   . Ejection fraction    EF 40% in the past   //    EF 25% January, 2014   //   EF 30%, echo, Aug 11, 2012, mid/apical anterior akinesis and anteroseptal akinesis and apical lateral akinesis and akinesis  of the true apex.  . H/O hiatal hernia   . Headache(784.0)   . Hx of amiodarone therapy    Atrial fibrillation  . HX: anticoagulation    Atrial fibrillation  . Hypertension   . Morbid obesity ([place])   . Myocardial infarction [facility])    [phone] . 2011 2014  . On home oxygen therapy   . Persistent atrial fibrillation ([place])    a. Diagnosed 02/2012 - long hosp stay, difficult to control rates, LAA thrombus noted, needed emergent DCCV for VT/afib/hypotension 04/14/2012.  Marland Kitchen Pneumonia    2014  . Pulmonary hypertension ([facility])    PA pressure 65 mmHg, echo, Aug 11, 2012  . Restless leg   . Shortness of breath dyspnea   . Sleep apnea    CPAP qhs  . Stroke [facility])    a. Stroke 02/2012 with recurrent CVA in-hospital 10/4130 felt embolic.  Marland Kitchen Sweating    May, 2014  . Thrombocytopenia ([place])   . Thrombus of left atrial appendage    a. Possible LAA thrombus on TEE 04/01/12.    Past Surgical History:  Procedure Laterality Date  . ;    . ABDOMINAL HYSTERECTOMY    . CARDIAC CATHETERIZATION  2013  . CARDIOVERSION N/A 06/02/2014   Procedure: CARDIOVERSION;  Surgeon: Herminio Commons, MD;  Location:  [facility] ORS;  Service: Endoscopy;  Laterality: N/A;  . CORONARY ARTERY BYPASS GRAFT  2001   x 4 vessels  . ESOPHAGOGASTRODUODENOSCOPY N/A 04/10/2015   Procedure: ESOPHAGOGASTRODUODENOSCOPY (EGD);  Surgeon: Laurence Spates, MD;  Location: [facility] ENDOSCOPY;  Service: Endoscopy;  Laterality: N/A;  . knee replacemnt  2000   total right  . LEFT AND RIGHT HEART CATHETERIZATION WITH CORONARY/GRAFT ANGIOGRAM N/A 03/30/2012   Procedure: LEFT AND RIGHT HEART CATHETERIZATION WITH Beatrix Fetters;  Surgeon: Sherren Mocha, MD;  Location: [facility] CATH LAB;  Service: Cardiovascular;  Laterality: N/A;  . rhinosplasty     x 2  . shoulder sx  2000   right bone spurs  . TEE WITHOUT CARDIOVERSION  04/01/2012   Procedure: TRANSESOPHAGEAL ECHOCARDIOGRAM (TEE);  Surgeon: Thayer Headings, MD;  Location: [facility] ENDOSCOPY;   Service: Cardiovascular;  Laterality: N/A;  . TONSILLECTOMY      Current Medications: Outpatient Medications Prior to Visit  Medication Sig Dispense Refill  . albuterol (PROVENTIL HFA;VENTOLIN HFA) 108 (90 BASE) MCG/ACT inhaler Inhale 1-2 puffs into the lungs every 6 (six) hours as needed for wheezing or shortness of breath.    Marland Kitchen amiodarone (PACERONE) 200 MG tablet TAKE 1 TABLET BY MOUTH DAILY 60 tablet 0  . AMITIZA 24 MCG capsule Take 1 capsule by mouth daily.  11  . atorvastatin (LIPITOR) 40 MG tablet Take 1 tablet every day 30 tablet 2  . azelastine (ASTELIN) 0.1 % nasal spray Place 1 spray into both nostrils daily.  12  . busPIRone (BUSPAR) 5 MG tablet Take 5 mg by mouth 2 (two) times daily.    . DULoxetine (CYMBALTA) 30 MG capsule Take 60 mg by mouth daily.     . ferrous gluconate (FERGON) 325 MG tablet Take 1 tablet by mouth daily.  11  . fluticasone (FLONASE) 50 MCG/ACT nasal spray Place 2 sprays into the nose daily.    . Fluticasone-Salmeterol (ADVAIR) 250-50 MCG/DOSE AEPB Inhale 1 puff into the lungs every 12 (twelve) hours.    Marland Kitchen HYDROcodone-acetaminophen (NORCO) 10-325 MG per tablet Take 1 tablet by mouth every 6 (six) hours as needed for pain.    Marland Kitchen ipratropium-albuterol (DUONEB) 0.5-2.5 (3) MG/3ML SOLN Take 3 mLs by nebulization every 6 (six) hours as needed (wheezing).     . isosorbide mononitrate (IMDUR) 30 MG 24 hr tablet TAKE 1 TABLET BY MOUTH DAILY 90 tablet 1  . isosorbide mononitrate (IMDUR) 30 MG 24 hr tablet TAKE 1 TABLET BY MOUTH ONCE A DAY(NEED TO MAKE APPT WITH DOCTOR) 90 tablet 0  . isosorbide mononitrate (IMDUR) 30 MG 24 hr tablet TAKE 1 TABLET BY MOUTH EVERY DAY 30 tablet 0  . lidocaine (LIDODERM) 5 % PLACE 1 PATCH ONTO THE SKIN EVERY DAY. REMOVE AND DISCARD PATCH WITHIN 12 HOURS OR AS DIRECTED BY MD 30 patch 0  . lisinopril (PRINIVIL,ZESTRIL) 20 MG tablet TAKE 1 TABLET BY MOUTH EVERY DAY 90 tablet 0  . meclizine (ANTIVERT) 25 MG tablet Take 1 tablet by mouth daily.     . montelukast (SINGULAIR) 10 MG tablet Take 1 tablet (10 mg total) by mouth at bedtime. 30 tablet 0  . NITROSTAT 0.4 MG SL tablet PLACE 1 TABLET UNDER TONGUE EVERY 5 MINUTES FOR 3 DOSES AS NEEDED FOR CHEST PAIN 100 tablet 0  . NON FORMULARY CPAP nightly    . NON FORMULARY Oxygen at 2L/min while out, prn other times.    Marland Kitchen NOVOLOG FLEXPEN 100 UNIT/ML FlexPen Inject 0-15 Units into the skin 3 (three)  times daily. 0-15 units per sliding scale up to three times daily  5  . omeprazole (PRILOSEC) 40 MG capsule Take 1 capsule (40 mg total) by mouth 2 (two) times daily. 180 capsule 3  . potassium chloride SA (K-DUR,KLOR-CON) 20 MEQ tablet Take 2 tablets (40 mEq total) by mouth 2 (two) times daily. 120 tablet 2  . PRADAXA 150 MG CAPS capsule TAKE 1 CAPSULE(150 MG) BY MOUTH TWICE DAILY 60 capsule 0  . rOPINIRole (REQUIP) 2 MG tablet Take 2 mg by mouth at bedtime.    . temazepam (RESTORIL) 15 MG capsule Take 15 mg by mouth at bedtime as needed for sleep.    Marland Kitchen tiotropium (SPIRIVA) 18 MCG inhalation capsule Place 18 mcg into inhaler and inhale daily.    Marland Kitchen torsemide (DEMADEX) 20 MG tablet Take 40 mg by mouth 2 (two) times daily. Dose decrease 05/18/14     No facility-administered medications prior to visit.      Allergies:   Penicillins; Lyrica [pregabalin]; Rocephin [ceftriaxone sodium in dextrose]; Azithromycin; Bactrim [sulfamethoxazole-trimethoprim]; Catapres [clonidine hcl]; Clonidine derivatives; and Sulfa antibiotics   Social History   Socioeconomic History  . Marital status: Single    Spouse name: Not on file  . Number of children: 4  . Years of education: GED  . Highest education level: Not on file  Occupational History  . Not on file  Social Needs  . Financial resource strain: Not on file  . Food insecurity:    Worry: Not on file    Inability: Not on file  . Transportation needs:    Medical: Not on file    Non-medical: Not on file  Tobacco Use  . Smoking status: Former Smoker     Types: Cigarettes    Start date: 08/15/1963    Last attempt to quit: 04/01/1975    Years since quitting: 42.6  . Smokeless tobacco: Never Used  Substance and Sexual Activity  . Alcohol use: No    Alcohol/week: 0.0 oz  . Drug use: No  . Sexual activity: Not on file  Lifestyle  . Physical activity:    Days per week: Not on file    Minutes per session: Not on file  . Stress: Not on file  Relationships  . Social connections:    Talks on phone: Not on file    Gets together: Not on file    Attends religious service: Not on file    Active member of club or organization: Not on file    Attends meetings of clubs or organizations: Not on file    Relationship status: Not on file  Other Topics Concern  . Not on file  Social History Narrative   Patient is single with 4 children   Patient has a GED   Patient is right handed   Patient drinks 2 cups daily     Family History:  The patient's ***family history includes CAD in her unknown relative; Heart disease in her unknown relative.   Review of Systems:   Please see the history of present illness.     General:  No chills, fever, night sweats or weight changes.  Cardiovascular:  No chest pain, dyspnea on exertion, edema, orthopnea, palpitations, paroxysmal nocturnal dyspnea. Dermatological: No rash, lesions/masses Respiratory: No cough, dyspnea Urologic: No hematuria, dysuria Abdominal:   No nausea, vomiting, diarrhea, bright red blood per rectum, melena, or hematemesis Neurologic:  No visual changes, wkns, changes in mental status. All other systems reviewed and are otherwise negative except  as noted above.   Physical Exam:    VS:  There were no vitals taken for this visit.   General: Well developed, well nourished,female appearing in no acute distress. Head: Normocephalic, atraumatic, sclera non-icteric, no xanthomas, nares are without discharge.  Neck: No carotid bruits. JVD not elevated.  Lungs: Respirations regular and  unlabored, without wheezes or rales.  Heart: ***Regular rate and rhythm. No S3 or S4.  No murmur, no rubs, or gallops appreciated. Abdomen: Soft, non-tender, non-distended with normoactive bowel sounds. No hepatomegaly. No rebound/guarding. No obvious abdominal masses. Msk:  Strength and tone appear normal for age. No joint deformities or effusions. Extremities: No clubbing or cyanosis. No edema.  Distal pedal pulses are 2+ bilaterally. Neuro: Alert and oriented X 3. Moves all extremities spontaneously. No focal deficits noted. Psych:  Responds to questions appropriately with a normal affect. Skin: No rashes or lesions noted  Wt Readings from Last 3 Encounters:  05/12/16 275 lb (124.7 kg)  09/26/15 279 lb (126.6 kg)  04/10/15 280 lb 3.3 oz (127.1 kg)        Studies/Labs Reviewed:   EKG:  EKG is*** ordered today.  The ekg ordered today demonstrates ***  Recent Labs: No results found for requested labs within last 8760 hours.   Lipid Panel    Component Value Date/Time   CHOL 160 03/30/2015 1040   TRIG 54 03/30/2015 1040   HDL 46 03/30/2015 1040   CHOLHDL 3.5 03/30/2015 1040   VLDL 11 03/30/2015 1040   LDLCALC 103 (H) 03/30/2015 1040    Additional studies/ records that were reviewed today include:   Echocardiogram: 03/2015 Study Conclusions  - Left ventricle: Septal and apical akinesis. The cavity size was   moderately dilated. Wall thickness was increased in a pattern of   moderate LVH. The estimated ejection fraction was 45%. - Mitral valve: There was moderate regurgitation. - Left atrium: The atrium was mildly dilated. - Atrial septum: No defect or patent foramen ovale was identified. - Pulmonary arteries: PA peak pressure: 92 mm Hg (S).   Assessment:    No diagnosis found.   Plan:   In order of problems listed above:  1. ***    Medication Adjustments/Labs and Tests Ordered: Current medicines are reviewed at length with the patient today.  Concerns  regarding medicines are outlined above.  Medication changes, Labs and Tests ordered today are listed in the Patient Instructions below. There are no Patient Instructions on file for this visit.   Signed, Erma Heritage, PA-C  10/26/2017 8:56 PM    [place] [address] [place], [place] [address] Phone: [phone]

## 2017-10-27 ENCOUNTER — Ambulatory Visit: Payer: Medicare Other | Admitting: Student

## 2017-11-23 NOTE — Progress Notes (Signed)
Cardiology Office Note    Date:  11/24/2017   ID:  Caitlyn Hardy, DOB 1945/06/01, MRN [id number]  PCP:  [name], MD  Cardiologist: Kate Sable, MD    Chief Complaint  Patient presents with  . Follow-up    Overdue Annual Visit    History of Present Illness:    Caitlyn Hardy is a 72 y.o. female with past medical history of CAD (s/p CABG in 2001, s/p PCI of LCx in 2011, patent grafts by cath in 02/2012, chronic combined systolic and diastolic CHF, PAF, COPD (on 2-3L [place] at baseline), HTN, and HLD who presents to the office today for overdue annual follow-up.   She was last examined by Dr. Bronson Ing in 05/2016 and denied any recent chest discomfort. She had recently been treated in the ED for acute bronchitis and reported her respiratory status was improving at that time. She was continued on her current medication regimen including Amiodarone 200 mg daily, Atorvastatin 10 mg daily, Pradaxa 150 mg twice daily, Imdur 30 mg daily, Lisinopril 20 mg daily, and Torsemide 40 mg twice daily. She has not been on beta-blocker therapy given baseline bradycardia.   In talking with the patient today, she reports that she has been followed by Dr. Carrolyn Leigh in [place], [place] over the past year due to changes in her insurance coverage. She has since had a change in this and wishes to reestablish care with [facility]. In the interim, she has undergone several medication changes including being switched from Pradaxa to Eliquis for anticoagulation, Lisinopril being transitioned to Entresto 97-103 mg twice daily, Torsemide being reduced to 40mg  every other day, and Carvedilol 3.125 mg twice daily being added back to her medication regimen. She reports having a repeat echocardiogram in 08/2017 and was informed that she would likely require ICD placement. She is unable to recall the specific numbers of her EF.  She has baseline dyspnea on exertion in the setting of COPD and uses 2 to 3 L  nasal cannula throughout the day and is compliant with CPAP at night. She denies any recent changes in her respiratory status. No recent orthopnea or PND. She does notice lower extremity edema on the days that she does not take Torsemide. Denies any recent chest pain or palpitations.  Remains on Eliquis for anticoagulation and denies any evidence of active bleeding.    Past Medical History:  Diagnosis Date  . Anemia   . Anginal pain ([place])   . Arthritis   . Asthma   . CAD (coronary artery disease)    a. CABG 2001. b. PCI 2011. c. NSTEMI 12/13-03/2012 in prolonged hosp stay, felt demand isch with stable cath.  . Cancer [facility])    hysterectomy 1979 r/t/ cancer  . Cardiomyopathy, ischemic   . Chronic kidney disease   . Chronic systolic CHF (congestive heart failure) ([place])    a. EF 40% 2011, down to 25% 03/2012 felt mixed isch/nonischemic (tachy-mediated).  . Complication of anesthesia   . COPD (chronic obstructive pulmonary disease) ([place])   . CVA (cerebral infarction)    Followed by Dr. Leonie Man  . Depression   . Diabetes mellitus ([place])   . Dizziness   . Ejection fraction    EF 40% in the past   //    EF 25% January, 2014   //   EF 30%, echo, Aug 11, 2012, mid/apical anterior akinesis and anteroseptal akinesis and apical lateral akinesis and akinesis of the true apex.  . H/O hiatal  hernia   . Headache(784.0)   . Hx of amiodarone therapy    Atrial fibrillation  . HX: anticoagulation    Atrial fibrillation  . Hypertension   . Morbid obesity ([place])   . Myocardial infarction [facility])    [phone] . 2011 2014  . On home oxygen therapy   . Persistent atrial fibrillation ([place])    a. Diagnosed 02/2012 - long hosp stay, difficult to control rates, LAA thrombus noted, needed emergent DCCV for VT/afib/hypotension 04/14/2012.  Marland Kitchen Pneumonia    2014  . Pulmonary hypertension (HCC)    PA pressure 65 mmHg, echo, Aug 11, 2012  . Restless leg   . Shortness of breath dyspnea   . Sleep apnea     CPAP qhs  . Stroke [facility])    a. Stroke 02/2012 with recurrent CVA in-hospital 10/1015 felt embolic.  Marland Kitchen Sweating    May, 2014  . Thrombocytopenia ([place])   . Thrombus of left atrial appendage    a. Possible LAA thrombus on TEE 04/01/12.    Past Surgical History:  Procedure Laterality Date  . ;    . ABDOMINAL HYSTERECTOMY    . CARDIAC CATHETERIZATION  2013  . CARDIOVERSION N/A 06/02/2014   Procedure: CARDIOVERSION;  Surgeon: Herminio Commons, MD;  Location: [facility];  Service: Endoscopy;  Laterality: N/A;  . CORONARY ARTERY BYPASS GRAFT  2001   x 4 vessels  . ESOPHAGOGASTRODUODENOSCOPY N/A 04/10/2015   Procedure: ESOPHAGOGASTRODUODENOSCOPY (EGD);  Surgeon: Laurence Spates, MD;  Location: [facility] ENDOSCOPY;  Service: Endoscopy;  Laterality: N/A;  . knee replacemnt  2000   total right  . LEFT AND RIGHT HEART CATHETERIZATION WITH CORONARY/GRAFT ANGIOGRAM N/A 03/30/2012   Procedure: LEFT AND RIGHT HEART CATHETERIZATION WITH Beatrix Fetters;  Surgeon: Sherren Mocha, MD;  Location[facility] C[facility] LAB;  Service: Cardiovascular;  Laterality: N/A;  . rhinosplasty     x 2  . shoulder sx  2000   right bone spurs  . TEE WITHOUT CARDIOVERSION  04/01/2012   Procedure: TRANSESOPHAGEAL ECHOCARDIOGRAM (TEE);  Surgeon: Thayer Headings, MD;  Location: [facility] ENDOSCOPY;  Service: Cardiovascular;  Laterality: N/A;  . TONSILLECTOMY      Current Medications: Outpatient Medications Prior to Visit  Medication Sig Dispense Refill  . albuterol (PROVENTIL HFA;VENTOLIN HFA) 108 (90 BASE) MCG/ACT inhaler Inhale 1-2 puffs into the lungs every 6 (six) hours as needed for wheezing or shortness of breath.    Marland Kitchen amiodarone (PACERONE) 200 MG tablet TAKE 1 TABLET BY MOUTH DAILY 60 tablet 0  . AMITIZA 24 MCG capsule Take 1 capsule by mouth daily.  11  . atorvastatin (LIPITOR) 40 MG tablet Take 1 tablet every day 30 tablet 2  . azelastine (ASTELIN) 0.1 % nasal spray Place 1 spray into both nostrils daily.  12  . busPIRone (BUSPAR) 5  MG tablet Take 5 mg by mouth 2 (two) times daily.    Marland Kitchen ELIQUIS 5 MG TABS tablet Take 5 mg by mouth 2 (two) times daily.     Marland Kitchen ENTRESTO 97-103 MG Take 1 tablet by mouth 2 (two) times daily.     . ferrous gluconate (FERGON) 325 MG tablet Take 1 tablet by mouth daily.  11  . fluticasone (FLONASE) 50 MCG/ACT nasal spray Place 2 sprays into the nose daily.    . Fluticasone-Salmeterol (ADVAIR) 250-50 MCG/DOSE AEPB Inhale 1 puff into the lungs every 12 (twelve) hours.    . gabapentin (NEURONTIN) 100 MG capsule Take 100 mg by mouth 3 (three) times daily.     Marland Kitchen  HYDROcodone-acetaminophen (NORCO) 10-325 MG per tablet Take 1 tablet by mouth every 6 (six) hours as needed for pain.    Marland Kitchen ipratropium-albuterol (DUONEB) 0.5-2.5 (3) MG/3ML SOLN Take 3 mLs by nebulization every 6 (six) hours as needed (wheezing).     . isosorbide mononitrate (IMDUR) 30 MG 24 hr tablet TAKE 1 TABLET BY MOUTH DAILY 90 tablet 1  . isosorbide mononitrate (IMDUR) 30 MG 24 hr tablet TAKE 1 TABLET BY MOUTH ONCE A DAY(NEED TO MAKE APPT WITH DOCTOR) 90 tablet 0  . isosorbide mononitrate (IMDUR) 30 MG 24 hr tablet TAKE 1 TABLET BY MOUTH EVERY DAY 30 tablet 0  . lidocaine (LIDODERM) 5 % PLACE 1 PATCH ONTO THE SKIN EVERY DAY. REMOVE AND DISCARD PATCH WITHIN 12 HOURS OR AS DIRECTED BY MD 30 patch 0  . meclizine (ANTIVERT) 25 MG tablet Take 1 tablet by mouth daily.    . montelukast (SINGULAIR) 10 MG tablet Take 1 tablet (10 mg total) by mouth at bedtime. 30 tablet 0  . NITROSTAT 0.4 MG SL tablet PLACE 1 TABLET UNDER TONGUE EVERY 5 MINUTES FOR 3 DOSES AS NEEDED FOR CHEST PAIN 100 tablet 0  . NON FORMULARY CPAP nightly    . NON FORMULARY Oxygen at 2L/min while out, prn other times.    Marland Kitchen NOVOLOG FLEXPEN 100 UNIT/ML FlexPen Inject 0-15 Units into the skin 3 (three) times daily. 0-15 units per sliding scale up to three times daily  5  . omeprazole (PRILOSEC) 40 MG capsule Take 1 capsule (40 mg total) by mouth 2 (two) times daily. 180 capsule 3  .  potassium chloride SA (K-DUR,KLOR-CON) 20 MEQ tablet Take 2 tablets (40 mEq total) by mouth 2 (two) times daily. 120 tablet 2  . rOPINIRole (REQUIP) 2 MG tablet Take 2 mg by mouth at bedtime.    . temazepam (RESTORIL) 15 MG capsule Take 15 mg by mouth at bedtime as needed for sleep.    Marland Kitchen tiotropium (SPIRIVA) 18 MCG inhalation capsule Place 18 mcg into inhaler and inhale daily.    Marland Kitchen torsemide (DEMADEX) 20 MG tablet Take 40 mg by mouth 2 (two) times daily. Dose decrease 05/18/14    . carvedilol (COREG) 3.125 MG tablet Take 3.125 mg by mouth 2 (two) times daily with a meal.     . DULoxetine (CYMBALTA) 30 MG capsule Take 60 mg by mouth daily.     . furosemide (LASIX) 20 MG tablet Take 20 mg by mouth daily.     Marland Kitchen lisinopril (PRINIVIL,ZESTRIL) 20 MG tablet TAKE 1 TABLET BY MOUTH EVERY DAY 90 tablet 0  . PRADAXA 150 MG CAPS capsule TAKE 1 CAPSULE(150 MG) BY MOUTH TWICE DAILY 60 capsule 0  . DULoxetine (CYMBALTA) 60 MG capsule      No facility-administered medications prior to visit.      Allergies:   Penicillins; Lyrica [pregabalin]; Rocephin [ceftriaxone sodium in dextrose]; Azithromycin; Bactrim [sulfamethoxazole-trimethoprim]; Catapres [clonidine hcl]; Clonidine derivatives; and Sulfa antibiotics   Social History   Socioeconomic History  . Marital status: Single    Spouse name: Not on file  . Number of children: 4  . Years of education: GED  . Highest education level: Not on file  Occupational History  . Not on file  Social Needs  . Financial resource strain: Not on file  . Food insecurity:    Worry: Not on file    Inability: Not on file  . Transportation needs:    Medical: Not on file    Non-medical: Not  on file  Tobacco Use  . Smoking status: Former Smoker    Types: Cigarettes    Start date: 08/15/1963    Last attempt to quit: 04/01/1975    Years since quitting: 42.6  . Smokeless tobacco: Never Used  Substance and Sexual Activity  . Alcohol use: No    Alcohol/week: 0.0 standard  drinks  . Drug use: No  . Sexual activity: Not on file  Lifestyle  . Physical activity:    Days per week: Not on file    Minutes per session: Not on file  . Stress: Not on file  Relationships  . Social connections:    Talks on phone: Not on file    Gets together: Not on file    Attends religious service: Not on file    Active member of club or organization: Not on file    Attends meetings of clubs or organizations: Not on file    Relationship status: Not on file  Other Topics Concern  . Not on file  Social History Narrative   Patient is single with 4 children   Patient has a GED   Patient is right handed   Patient drinks 2 cups daily     Family History:  The patient's family history includes CAD in her unknown relative; Heart disease in her unknown relative.   Review of Systems:   Please see the history of present illness.     General:  No chills, fever, night sweats or weight changes.  Cardiovascular:  No chest pain, orthopnea, palpitations, paroxysmal nocturnal dyspnea. Positive for chronic dyspnea on exertion and edema.  Dermatological: No rash, lesions/masses Respiratory: No cough, dyspnea Urologic: No hematuria, dysuria Abdominal:   No nausea, vomiting, diarrhea, bright red blood per rectum, melena, or hematemesis Neurologic:  No visual changes, wkns, changes in mental status. All other systems reviewed and are otherwise negative except as noted above.   Physical Exam:    VS:  BP 132/72 (BP Location: Left Arm)   Pulse 94   Ht 5' 6.5" (1.689 m)   Wt 282 lb (127.9 kg)   SpO2 94%   BMI 44.83 kg/m    General: Well developed, well nourished obese Caucasian female appearing in no acute distress. Head: Normocephalic, atraumatic, sclera non-icteric, no xanthomas, nares are without discharge.  Neck: No carotid bruits. JVD not elevated.  Lungs: Respirations regular and unlabored, without wheezes or rales. On 2L [place].  Heart: Irregularly irregular. No S3 or S4.  No  murmur, no rubs, or gallops appreciated. Abdomen: Soft, non-tender, non-distended with normoactive bowel sounds. No hepatomegaly. No rebound/guarding. No obvious abdominal masses. Msk:  Strength and tone appear normal for age. No joint deformities or effusions. Extremities: No clubbing or cyanosis. Trace lower extremity edema bilaterally.  Distal pedal pulses are 2+ bilaterally. Neuro: Alert and oriented X 3. Moves all extremities spontaneously. No focal deficits noted. Psych:  Responds to questions appropriately with a normal affect. Skin: No rashes or lesions noted  Wt Readings from Last 3 Encounters:  11/24/17 282 lb (127.9 kg)  05/12/16 275 lb (124.7 kg)  09/26/15 279 lb (126.6 kg)     Studies/Labs Reviewed:   EKG:  EKG is ordered today.  The ekg ordered today demonstrates rate controlled atrial fibrillation, heart rate 94, with nonspecific ST abnormalities along the lateral leads.  Recent Labs: No results found for requested labs within last 8760 hours.   Lipid Panel    Component Value Date/Time   CHOL 160 03/30/2015 1040  TRIG 54 03/30/2015 1040   HDL 46 03/30/2015 1040   CHOLHDL 3.5 03/30/2015 1040   VLDL 11 03/30/2015 1040   LDLCALC 103 (H) 03/30/2015 1040    Additional studies/ records that were reviewed today include:   Echocardiogram: 03/2015 Study Conclusions  - Left ventricle: Septal and apical akinesis. The cavity size was   moderately dilated. Wall thickness was increased in a pattern of   moderate LVH. The estimated ejection fraction was 45%. - Mitral valve: There was moderate regurgitation. - Left atrium: The atrium was mildly dilated. - Atrial septum: No defect or patent foramen ovale was identified. - Pulmonary arteries: PA peak pressure: 92 mm Hg (S).  Assessment:    1. Chronic combined systolic and diastolic heart failure ([place])   2. Coronary artery disease involving native coronary artery of native heart without angina pectoris   3. Paroxysmal  atrial fibrillation (HCC)   4. Essential hypertension   5. Hyperlipidemia LDL goal <70   6. Chronic obstructive pulmonary disease, unspecified COPD type ([place])      Plan:   In order of problems listed above:  1. Chronic Combined Systolic and Diastolic CHF - EF at 72% by echocardiogram in 2016 and she reports a repeat echo was performed in 08/2017 and was informed her EF was further reduced but is unable to recall specific information about her study. Will request records today.  - she appears euvolemic by examination today and has not required any recent hospitalizations for CHF exacerbations. - she is currently on Coreg 3.125mg  BID, Entresto 97-103mg  BID, and Torsemide 40mg  every other day. Will further titrate Coreg to 6.25mg  BID. Will plan for a repeat echocardiogram in 2 months to reassess EF. If EF < 35%, would consider EP referral for consideration of ICD placement.   2. CAD - s/p CABG in 2001, s/p PCI of LCx in 2011 with patent grafts by cath in 02/2012.  - she has baseline dyspnea on exertion in the setting of COPD but denies any recent changes in this. No recent chest pain. - continue BB, statin, and Imdur. Not on ASA given the need for anticoagulation.   3. Paroxysmal Atrial Fibrillation - she denies any recent palpitations. HR is in the 90's during today's visit. Will titrate Coreg from 3.125mg  BID to 6.25mg  BID. Remains on Amiodarone 200mg  daily.  - she denies any evidence of active bleeding. Remains on Eliquis 5mg  BID for anticoagulation.   4. HTN - BP is at 132/72 during today's visit.  - continue Entresto and Imdur at current dosing with titration of Coreg to 6.25mg  BID as outlined above. Verified with the patient that she is no longer on Lisinopril given the concurrent use of Entresto.   5. HLD - followed by PCP. Goal LDL is < 70 with known CAD. Currently on Atorvastatin 80mg  daily. Will request copies of her most recent lab work.   6. COPD - she remains on 2-3L [place] at  baseline and uses CPAP at night. Followed by Dr. Koleen Nimrod in [place], [place].   Medication Adjustments/Labs and Tests Ordered: Current medicines are reviewed at length with the patient today.  Concerns regarding medicines are outlined above.  Medication changes, Labs and Tests ordered today are listed in the Patient Instructions below. Patient Instructions  Medication Instructions:  Your physician has recommended you make the following change in your medication:  Take Coreg 6.25 mg Two times daily    Labwork: NONE   Testing/Procedures: Your physician has requested that you have an  echocardiogram. Echocardiography is a painless test that uses sound waves to create images of your heart. It provides your doctor with information about the size and shape of your heart and how well your heart's chambers and valves are working. This procedure takes approximately one hour. There are no restrictions for this procedure.  Follow-Up: Your physician recommends that you schedule a follow-up appointment after echo is complete.   Any Other Special Instructions Will Be Listed Below (If Applicable).  If you need a refill on your cardiac medications before your next appointment, please call your pharmacy.  Thank you for choosing [facility]!    Signed, Erma Heritage, PA-C  11/24/2017 8:12 PM    [facility] [address] [place], [place] [address] Phone: [phone]

## 2017-11-24 ENCOUNTER — Ambulatory Visit (INDEPENDENT_AMBULATORY_CARE_PROVIDER_SITE_OTHER): Payer: 59 | Admitting: Student

## 2017-11-24 ENCOUNTER — Encounter: Payer: Self-pay | Admitting: Student

## 2017-11-24 VITALS — BP 132/72 | HR 94 | Ht 66.5 in | Wt 282.0 lb

## 2017-11-24 DIAGNOSIS — J449 Chronic obstructive pulmonary disease, unspecified: Secondary | ICD-10-CM

## 2017-11-24 DIAGNOSIS — I5042 Chronic combined systolic (congestive) and diastolic (congestive) heart failure: Secondary | ICD-10-CM

## 2017-11-24 DIAGNOSIS — I2510 Atherosclerotic heart disease of native coronary artery without angina pectoris: Secondary | ICD-10-CM

## 2017-11-24 DIAGNOSIS — I480 Paroxysmal atrial fibrillation: Secondary | ICD-10-CM | POA: Diagnosis not present

## 2017-11-24 DIAGNOSIS — E785 Hyperlipidemia, unspecified: Secondary | ICD-10-CM

## 2017-11-24 DIAGNOSIS — I10 Essential (primary) hypertension: Secondary | ICD-10-CM

## 2017-11-24 MED ORDER — CARVEDILOL 6.25 MG PO TABS: 6.2500 mg | ORAL_TABLET | Freq: Two times a day (BID) | ORAL | 3 refills | Status: DC

## 2017-11-24 NOTE — Patient Instructions (Addendum)
Medication Instructions:  Your physician has recommended you make the following change in your medication:  Take Coreg 6.25 mg Two times daily    Labwork: NONE   Testing/Procedures: Your physician has requested that you have an echocardiogram. Echocardiography is a painless test that uses sound waves to create images of your heart. It provides your doctor with information about the size and shape of your heart and how well your heart's chambers and valves are working. This procedure takes approximately one hour. There are no restrictions for this procedure.     Follow-Up: Your physician recommends that you schedule a follow-up appointment after echo is complete.    Any Other Special Instructions Will Be Listed Below (If Applicable).     If you need a refill on your cardiac medications before your next appointment, please call your pharmacy.  Thank you for choosing [facility]!

## 2018-01-22 ENCOUNTER — Other Ambulatory Visit: Payer: Self-pay | Admitting: Cardiovascular Disease

## 2018-01-22 MED ORDER — ELIQUIS 5 MG PO TABS: 5.0000 mg | ORAL_TABLET | Freq: Two times a day (BID) | ORAL | 1 refills | Status: AC

## 2018-01-22 MED ORDER — CARVEDILOL 6.25 MG PO TABS: 6.2500 mg | ORAL_TABLET | Freq: Two times a day (BID) | ORAL | 1 refills | Status: DC

## 2018-01-22 MED ORDER — ENTRESTO 97-103 MG PO TABS: 1.0000 | ORAL_TABLET | Freq: Two times a day (BID) | ORAL | 1 refills | Status: DC

## 2018-01-22 NOTE — Telephone Encounter (Signed)
Medication sent to pharmacy  

## 2018-01-22 NOTE — Telephone Encounter (Signed)
° °  1. Which medications need to be refilled? (please list name of each medication and dose if known)  Coreg, Eliquis, Entresto    2. Which pharmacy/location (including street and city if local pharmacy) is medication to be sent to?   [facility]  3. Do they need a 30 day or 90 day supply?  Patient needs today

## 2018-01-25 ENCOUNTER — Ambulatory Visit (HOSPITAL_COMMUNITY)
Admission: RE | Admit: 2018-01-25 | Discharge: 2018-01-25 | Disposition: A | Discharge status: Home / Self Care | Payer: 59 | Source: Ambulatory Visit | Attending: Student | Admitting: Student

## 2018-01-25 DIAGNOSIS — E119 Type 2 diabetes mellitus without complications: Secondary | ICD-10-CM | POA: Diagnosis not present

## 2018-01-25 DIAGNOSIS — I2510 Atherosclerotic heart disease of native coronary artery without angina pectoris: Secondary | ICD-10-CM | POA: Diagnosis not present

## 2018-01-25 DIAGNOSIS — I110 Hypertensive heart disease with heart failure: Secondary | ICD-10-CM | POA: Diagnosis not present

## 2018-01-25 DIAGNOSIS — I255 Ischemic cardiomyopathy: Secondary | ICD-10-CM | POA: Insufficient documentation

## 2018-01-25 DIAGNOSIS — J449 Chronic obstructive pulmonary disease, unspecified: Secondary | ICD-10-CM | POA: Insufficient documentation

## 2018-01-25 DIAGNOSIS — I4891 Unspecified atrial fibrillation: Secondary | ICD-10-CM | POA: Insufficient documentation

## 2018-01-25 DIAGNOSIS — I5042 Chronic combined systolic (congestive) and diastolic (congestive) heart failure: Secondary | ICD-10-CM | POA: Diagnosis not present

## 2018-01-25 DIAGNOSIS — I340 Nonrheumatic mitral (valve) insufficiency: Secondary | ICD-10-CM | POA: Insufficient documentation

## 2018-01-25 MED ORDER — PERFLUTREN LIPID MICROSPHERE
1.0000 mL | INTRAVENOUS | Status: AC | PRN
  Administered 2018-01-25: 3 mL via INTRAVENOUS
  Filled 2018-01-25: qty 10

## 2018-01-25 NOTE — Progress Notes (Signed)
*  PRELIMINARY RESULTS* Echocardiogram 2D Echocardiogram WITH DEFINITY has been performed.  Aunesty, Tyson 01/25/2018, 5:04 PM

## 2018-02-01 ENCOUNTER — Encounter: Payer: Self-pay | Admitting: *Deleted

## 2018-02-14 ENCOUNTER — Telehealth: Payer: Self-pay | Admitting: Physician Assistant

## 2018-02-14 NOTE — Telephone Encounter (Signed)
Patient's son paged after his answering service.  Apparently he went back into A. fib for the past 2 weeks and is currently staying at [facility] for overnight observation.  According to her son, who is a EMT in [place], she is actually quite asymptomatic while in atrial fibrillation.  He also mentions she has been very compliant with her Eliquis.  If this is the case, especially if she is symptomatic, may need to consider DC cardioversion without TEE to control her symptoms.  Hilbert Corrigan PA Pager: [phone]

## 2018-02-22 ENCOUNTER — Telehealth: Payer: Self-pay | Admitting: Cardiovascular Disease

## 2018-02-22 NOTE — Telephone Encounter (Signed)
Patient's son is asking to speak with nurse in regards to concerns with patient being in hospital and [place]. / tg

## 2018-02-22 NOTE — Telephone Encounter (Signed)
Returned pt call. Son states that his mother is in the hospital in [place], [place]. She is having runs of a-fib with rvr, heart rates in 120's constantly. He is concerned and would like for His mother to be transferred to [facility]. I advised him to discuss this with the cardiologist there. He voiced understanding.

## 2018-02-23 ENCOUNTER — Other Ambulatory Visit: Payer: Self-pay

## 2018-02-23 ENCOUNTER — Inpatient Hospital Stay (HOSPITAL_COMMUNITY)
Admission: AD | Admit: 2018-02-23 | Discharge: 2018-03-05 | DRG: 308 | Disposition: A | Discharge status: Skilled Nursing Facility | Payer: 59 | Source: Other Acute Inpatient Hospital | Attending: Internal Medicine | Admitting: Internal Medicine

## 2018-02-23 ENCOUNTER — Inpatient Hospital Stay (HOSPITAL_COMMUNITY): Payer: 59

## 2018-02-23 DIAGNOSIS — I5022 Chronic systolic (congestive) heart failure: Secondary | ICD-10-CM | POA: Diagnosis present

## 2018-02-23 DIAGNOSIS — J9611 Chronic respiratory failure with hypoxia: Secondary | ICD-10-CM | POA: Diagnosis present

## 2018-02-23 DIAGNOSIS — Z79899 Other long term (current) drug therapy: Secondary | ICD-10-CM

## 2018-02-23 DIAGNOSIS — I480 Paroxysmal atrial fibrillation: Secondary | ICD-10-CM | POA: Diagnosis present

## 2018-02-23 DIAGNOSIS — F0390 Unspecified dementia without behavioral disturbance: Secondary | ICD-10-CM | POA: Diagnosis present

## 2018-02-23 DIAGNOSIS — Z6841 Body Mass Index (BMI) 40.0 and over, adult: Secondary | ICD-10-CM | POA: Diagnosis not present

## 2018-02-23 DIAGNOSIS — R0902 Hypoxemia: Secondary | ICD-10-CM | POA: Diagnosis not present

## 2018-02-23 DIAGNOSIS — Z794 Long term (current) use of insulin: Secondary | ICD-10-CM

## 2018-02-23 DIAGNOSIS — E1122 Type 2 diabetes mellitus with diabetic chronic kidney disease: Secondary | ICD-10-CM | POA: Diagnosis present

## 2018-02-23 DIAGNOSIS — Z781 Physical restraint status: Secondary | ICD-10-CM

## 2018-02-23 DIAGNOSIS — F05 Delirium due to known physiological condition: Secondary | ICD-10-CM | POA: Diagnosis present

## 2018-02-23 DIAGNOSIS — G2581 Restless legs syndrome: Secondary | ICD-10-CM | POA: Diagnosis present

## 2018-02-23 DIAGNOSIS — I130 Hypertensive heart and chronic kidney disease with heart failure and stage 1 through stage 4 chronic kidney disease, or unspecified chronic kidney disease: Secondary | ICD-10-CM | POA: Diagnosis present

## 2018-02-23 DIAGNOSIS — F329 Major depressive disorder, single episode, unspecified: Secondary | ICD-10-CM | POA: Diagnosis present

## 2018-02-23 DIAGNOSIS — I5033 Acute on chronic diastolic (congestive) heart failure: Secondary | ICD-10-CM | POA: Diagnosis not present

## 2018-02-23 DIAGNOSIS — I2510 Atherosclerotic heart disease of native coronary artery without angina pectoris: Secondary | ICD-10-CM | POA: Diagnosis present

## 2018-02-23 DIAGNOSIS — R410 Disorientation, unspecified: Secondary | ICD-10-CM | POA: Diagnosis not present

## 2018-02-23 DIAGNOSIS — J449 Chronic obstructive pulmonary disease, unspecified: Secondary | ICD-10-CM | POA: Diagnosis present

## 2018-02-23 DIAGNOSIS — M795 Residual foreign body in soft tissue: Secondary | ICD-10-CM

## 2018-02-23 DIAGNOSIS — I252 Old myocardial infarction: Secondary | ICD-10-CM

## 2018-02-23 DIAGNOSIS — G4730 Sleep apnea, unspecified: Secondary | ICD-10-CM | POA: Diagnosis present

## 2018-02-23 DIAGNOSIS — I4819 Other persistent atrial fibrillation: Principal | ICD-10-CM | POA: Diagnosis present

## 2018-02-23 DIAGNOSIS — R627 Adult failure to thrive: Secondary | ICD-10-CM | POA: Diagnosis present

## 2018-02-23 DIAGNOSIS — I5030 Unspecified diastolic (congestive) heart failure: Secondary | ICD-10-CM

## 2018-02-23 DIAGNOSIS — I4891 Unspecified atrial fibrillation: Secondary | ICD-10-CM | POA: Diagnosis not present

## 2018-02-23 DIAGNOSIS — Z90710 Acquired absence of both cervix and uterus: Secondary | ICD-10-CM

## 2018-02-23 DIAGNOSIS — Z87891 Personal history of nicotine dependence: Secondary | ICD-10-CM

## 2018-02-23 DIAGNOSIS — Z955 Presence of coronary angioplasty implant and graft: Secondary | ICD-10-CM

## 2018-02-23 DIAGNOSIS — Z951 Presence of aortocoronary bypass graft: Secondary | ICD-10-CM

## 2018-02-23 DIAGNOSIS — G4733 Obstructive sleep apnea (adult) (pediatric): Secondary | ICD-10-CM | POA: Diagnosis not present

## 2018-02-23 DIAGNOSIS — Z8673 Personal history of transient ischemic attack (TIA), and cerebral infarction without residual deficits: Secondary | ICD-10-CM | POA: Diagnosis not present

## 2018-02-23 DIAGNOSIS — I255 Ischemic cardiomyopathy: Secondary | ICD-10-CM | POA: Diagnosis not present

## 2018-02-23 DIAGNOSIS — D696 Thrombocytopenia, unspecified: Secondary | ICD-10-CM | POA: Diagnosis present

## 2018-02-23 DIAGNOSIS — N390 Urinary tract infection, site not specified: Secondary | ICD-10-CM | POA: Diagnosis present

## 2018-02-23 DIAGNOSIS — I951 Orthostatic hypotension: Secondary | ICD-10-CM | POA: Diagnosis present

## 2018-02-23 DIAGNOSIS — Z9981 Dependence on supplemental oxygen: Secondary | ICD-10-CM

## 2018-02-23 DIAGNOSIS — N183 Chronic kidney disease, stage 3 (moderate): Secondary | ICD-10-CM | POA: Diagnosis present

## 2018-02-23 DIAGNOSIS — Z7951 Long term (current) use of inhaled steroids: Secondary | ICD-10-CM

## 2018-02-23 DIAGNOSIS — I272 Pulmonary hypertension, unspecified: Secondary | ICD-10-CM | POA: Diagnosis present

## 2018-02-23 DIAGNOSIS — Z7901 Long term (current) use of anticoagulants: Secondary | ICD-10-CM

## 2018-02-23 DIAGNOSIS — E039 Hypothyroidism, unspecified: Secondary | ICD-10-CM | POA: Diagnosis present

## 2018-02-23 DIAGNOSIS — I482 Chronic atrial fibrillation, unspecified: Secondary | ICD-10-CM | POA: Diagnosis not present

## 2018-02-23 DIAGNOSIS — Z880 Allergy status to penicillin: Secondary | ICD-10-CM

## 2018-02-23 DIAGNOSIS — G9341 Metabolic encephalopathy: Secondary | ICD-10-CM | POA: Diagnosis present

## 2018-02-23 DIAGNOSIS — E876 Hypokalemia: Secondary | ICD-10-CM | POA: Diagnosis present

## 2018-02-23 LAB — COMPREHENSIVE METABOLIC PANEL
ALBUMIN: 3.7 g/dL (ref 3.5–5.0)
ALT: 12 U/L (ref 0–44)
ANION GAP: 9 (ref 5–15)
AST: 19 U/L (ref 15–41)
Alkaline Phosphatase: 40 U/L (ref 38–126)
BUN: 24 mg/dL — ABNORMAL HIGH (ref 8–23)
CO2: 33 mmol/L — ABNORMAL HIGH (ref 22–32)
Calcium: 9 mg/dL (ref 8.9–10.3)
Chloride: 99 mmol/L (ref 98–111)
Creatinine, Ser: 1.61 mg/dL — ABNORMAL HIGH (ref 0.44–1.00)
GFR calc Af Amer: 37 mL/min — ABNORMAL LOW (ref >60)
GFR calc non Af Amer: 32 mL/min — ABNORMAL LOW (ref >60)
GLUCOSE: 95 mg/dL (ref 70–99)
Potassium: 3.7 mmol/L (ref 3.5–5.1)
SODIUM: 141 mmol/L (ref 135–145)
TOTAL PROTEIN: 6.3 g/dL — AB (ref 6.5–8.1)
Total Bilirubin: 0.8 mg/dL (ref 0.3–1.2)

## 2018-02-23 LAB — CBC WITH DIFFERENTIAL/PLATELET
ABS IMMATURE GRANULOCYTES: 0.01 10*3/uL (ref 0.00–0.07)
Basophils Absolute: 0 10*3/uL (ref 0.0–0.1)
Basophils Relative: 1 %
EOS ABS: 0.1 10*3/uL (ref 0.0–0.5)
Eosinophils Relative: 3 %
HEMATOCRIT: 35.6 % — AB (ref 36.0–46.0)
Hemoglobin: 10.2 g/dL — ABNORMAL LOW (ref 12.0–15.0)
IMMATURE GRANULOCYTES: 0 %
LYMPHS ABS: 1.5 10*3/uL (ref 0.7–4.0)
Lymphocytes Relative: 32 %
MCH: 26.4 pg (ref 26.0–34.0)
MCHC: 28.7 g/dL — ABNORMAL LOW (ref 30.0–36.0)
MCV: 92.2 fL (ref 80.0–100.0)
MONO ABS: 0.3 10*3/uL (ref 0.1–1.0)
MONOS PCT: 6 %
NEUTROS PCT: 58 %
Neutro Abs: 2.6 10*3/uL (ref 1.7–7.7)
Platelets: 126 10*3/uL — ABNORMAL LOW (ref 150–400)
RBC: 3.86 MIL/uL — ABNORMAL LOW (ref 3.87–5.11)
RDW: 15.9 % — AB (ref 11.5–15.5)
WBC: 4.6 10*3/uL (ref 4.0–10.5)
nRBC: 0 % (ref 0.0–0.2)

## 2018-02-23 LAB — GLUCOSE, CAPILLARY: Glucose-Capillary: 102 mg/dL — ABNORMAL HIGH (ref 70–99)

## 2018-02-23 MED ORDER — HEPARIN (PORCINE) 25000 UT/250ML-% IV SOLN
1100.0000 [IU]/h | INTRAVENOUS | Status: DC
  Filled 2018-02-23: qty 250

## 2018-02-23 MED ORDER — ONDANSETRON HCL 4 MG/2ML IJ SOLN
4.0000 mg | Freq: Four times a day (QID) | INTRAMUSCULAR | Status: DC | PRN
  Administered 2018-02-25: 4 mg via INTRAVENOUS
  Filled 2018-02-23: qty 2

## 2018-02-23 MED ORDER — ORAL CARE MOUTH RINSE
15.0000 mL | Freq: Two times a day (BID) | OROMUCOSAL | Status: DC
  Administered 2018-02-24 – 2018-03-05 (×15): 15 mL via OROMUCOSAL

## 2018-02-23 MED ORDER — ACETAMINOPHEN 325 MG PO TABS
650.0000 mg | ORAL_TABLET | ORAL | Status: DC | PRN
  Administered 2018-02-24 – 2018-03-04 (×8): 650 mg via ORAL
  Filled 2018-02-23 (×8): qty 2

## 2018-02-23 NOTE — H&P (Signed)
History and Physical   Caitlyn [place] ELF:[id number] DOB: 1945-10-26 DOA: 02/23/2018  Referring MD/NP/PA: From [name]  PCP: [name], MD   Outpatient Specialists: None  Patient coming from: [facility] in [place] Complaint: Orthostatic hypotension and A. fib  HPI: Caitlyn [place] is a 72 y.o. female with medical history significant of coronary artery disease, atrial fibrillation, history of coronary artery bypass grafting, systolic dysfunction CHF with EF of 35 to 40%, COPD, history of CVA, obstructive sleep apnea on CPAP, morbid obesity, chronic kidney disease stage III who was admitted at the hospital in [place] initially with altered mental status as well as A. fib with RVR.  Patient was later found to have UTI attributed to the cause of her altered mental status.  While in the hospital patient continued to have several symptoms including orthostatic hypotension.  She had a typical chest pain.  Her confusion was also thought to be hospital related delirium.  Per family they noted occasional hypoxemia.  Patient was initially diuresed and then had to be given some fluid.  She became orthostatic at the time and they got worried since yesterday and requested transfer to [facility].  Patient arrives here today slightly confused but oriented to herself and family.  Able to communicate.  No evidence of orthostasis on arrival.  Her blood pressure was in the 585I systolic.  She is also in sinus rhythm on arrival on telemetry.  She is notably however confused as indicated.  Son is with the patient and gave most of the history.  Repeat labs here showed urinalysis with large leukocyte Estrace WBC 6-10 and rare bacteria.  EKG showing atrial fibrillation but controlled rate.  Chest x-ray showing cardiomegaly with vascular congestion.  Chemistry is normal except for creatinine 1.61 her hemoglobin is 10.2 and platelets 126.  Review of Systems: As per HPI otherwise 10 point  review of systems negative.    Past Medical History:  Diagnosis Date  . Anemia   . Anginal pain ([place])   . Arthritis   . Asthma   . CAD (coronary artery disease)    a. CABG 2001. b. PCI 2011. c. NSTEMI 12/13-03/2012 in prolonged hosp stay, felt demand isch with stable cath.  . Cancer [facility])    hysterectomy 1979 r/t/ cancer  . Cardiomyopathy, ischemic   . Chronic kidney disease   . Chronic systolic CHF (congestive heart failure) ([place])    a. EF 40% 2011, down to 25% 03/2012 felt mixed isch/nonischemic (tachy-mediated).  . Complication of anesthesia   . COPD (chronic obstructive pulmonary disease) ([place])   . CVA (cerebral infarction)    Followed by Dr. Leonie Man  . Depression   . Diabetes mellitus ([place])   . Dizziness   . Ejection fraction    EF 40% in the past   //    EF 25% January, 2014   //   EF 30%, echo, Aug 11, 2012, mid/apical anterior akinesis and anteroseptal akinesis and apical lateral akinesis and akinesis of the true apex.  . H/O hiatal hernia   . Headache(784.0)   . Hx of amiodarone therapy    Atrial fibrillation  . HX: anticoagulation    Atrial fibrillation  . Hypertension   . Morbid obesity ([place])   . Myocardial infarction [facility])    [phone] . 2011 2014  . On home oxygen therapy   . Persistent atrial fibrillation ([place])    a. Diagnosed 02/2012 - long hosp stay, difficult to control rates, LAA  thrombus noted, needed emergent DCCV for VT/afib/hypotension 04/14/2012.  Marland Kitchen Pneumonia    2014  . Pulmonary hypertension (HCC)    PA pressure 65 mmHg, echo, Aug 11, 2012  . Restless leg   . Shortness of breath dyspnea   . Sleep apnea    CPAP qhs  . Stroke [facility])    a. Stroke 02/2012 with recurrent CVA in-hospital 05/9765 felt embolic.  Marland Kitchen Sweating    May, 2014  . Thrombocytopenia ([place])   . Thrombus of left atrial appendage    a. Possible LAA thrombus on TEE 04/01/12.    Past Surgical History:  Procedure Laterality Date  . ;    . ABDOMINAL HYSTERECTOMY    . CARDIAC  CATHETERIZATION  2013  . CARDIOVERSION N/A 06/02/2014   Procedure: CARDIOVERSION;  Surgeon: Herminio Commons, MD;  Location: [facility] ORS;  Service: Endoscopy;  Laterality: N/A;  . CORONARY ARTERY BYPASS GRAFT  2001   x 4 vessels  . ESOPHAGOGASTRODUODENOSCOPY N/A 04/10/2015   Procedure: ESOPHAGOGASTRODUODENOSCOPY (EGD);  Surgeon: Laurence Spates, MD;  Location: [facility] ENDOSCOPY;  Service: Endoscopy;  Laterality: N/A;  . knee replacemnt  2000   total right  . LEFT AND RIGHT HEART CATHETERIZATION WITH CORONARY/GRAFT ANGIOGRAM N/A 03/30/2012   Procedure: LEFT AND RIGHT HEART CATHETERIZATION WITH Beatrix Fetters;  Surgeon: Sherren Mocha, MD;  Location: [facility] CATH LAB;  Service: Cardiovascular;  Laterality: N/A;  . rhinosplasty     x 2  . shoulder sx  2000   right bone spurs  . TEE WITHOUT CARDIOVERSION  04/01/2012   Procedure: TRANSESOPHAGEAL ECHOCARDIOGRAM (TEE);  Surgeon: Thayer Headings, MD;  Location: [facility] ENDOSCOPY;  Service: Cardiovascular;  Laterality: N/A;  . TONSILLECTOMY       reports that she quit smoking about 42 years ago. Her smoking use included cigarettes. She started smoking about 54 years ago. She has never used smokeless tobacco. She reports that she does not drink alcohol or use drugs.  Allergies  Allergen Reactions  . Penicillins Anaphylaxis  . Lyrica [Pregabalin] Swelling  . Rocephin [Ceftriaxone Sodium In Dextrose]     Rash/hives/itching  . Azithromycin Hives and Rash  . Bactrim [Sulfamethoxazole-Trimethoprim] Hives and Rash  . Catapres [Clonidine Hcl] Itching and Rash  . Clonidine Derivatives Itching and Rash  . Sulfa Antibiotics Hives and Rash    Family History  Problem Relation Age of Onset  . CAD Unknown   . Heart disease Unknown      Prior to Admission medications   Medication Sig Start Date End Date Taking? Authorizing Provider  albuterol (PROVENTIL HFA;VENTOLIN HFA) 108 (90 BASE) MCG/ACT inhaler Inhale 1-2 puffs into the lungs every 6 (six) hours as needed  for wheezing or shortness of breath.    [provider]  amiodarone (PACERONE) 200 MG tablet TAKE 1 TABLET BY MOUTH DAILY 11/04/16   Herminio Commons, MD  AMITIZA 24 MCG capsule Take 1 capsule by mouth daily. 03/26/16   [provider]  atorvastatin (LIPITOR) 40 MG tablet Take 1 tablet every day 09/15/17   Herminio Commons, MD  azelastine (ASTELIN) 0.1 % nasal spray Place 1 spray into both nostrils daily. 03/08/16   [provider]  busPIRone (BUSPAR) 5 MG tablet Take 5 mg by mouth 2 (two) times daily.    [provider]  carvedilol (COREG) 6.25 MG tablet Take 1 tablet (6.25 mg total) by mouth 2 (two) times daily. 01/22/18   Arnoldo Lenis, MD  ELIQUIS 5 MG TABS tablet Take 1  tablet (5 mg total) by mouth 2 (two) times daily. 01/22/18   Arnoldo Lenis, MD  ENTRESTO 97-103 MG Take 1 tablet by mouth 2 (two) times daily. 01/22/18   Arnoldo Lenis, MD  ferrous gluconate (FERGON) 325 MG tablet Take 1 tablet by mouth daily. 03/28/16   [provider]  fluticasone (FLONASE) 50 MCG/ACT nasal spray Place 2 sprays into the nose daily.    [provider]  Fluticasone-Salmeterol (ADVAIR) 250-50 MCG/DOSE AEPB Inhale 1 puff into the lungs every 12 (twelve) hours.    [provider]  gabapentin (NEURONTIN) 100 MG capsule Take 100 mg by mouth 3 (three) times daily.  11/16/17   [provider]  HYDROcodone-acetaminophen (NORCO) 10-325 MG per tablet Take 1 tablet by mouth every 6 (six) hours as needed for pain.    [provider]  ipratropium-albuterol (DUONEB) 0.5-2.5 (3) MG/3ML SOLN Take 3 mLs by nebulization every 6 (six) hours as needed (wheezing).     [provider]  isosorbide mononitrate (IMDUR) 30 MG 24 hr tablet TAKE 1 TABLET BY MOUTH DAILY 08/01/16   Herminio Commons, MD  isosorbide mononitrate (IMDUR) 30 MG 24 hr tablet TAKE 1 TABLET BY MOUTH ONCE A DAY(NEED TO MAKE APPT WITH DOCTOR) 12/29/16    Herminio Commons, MD  isosorbide mononitrate (IMDUR) 30 MG 24 hr tablet TAKE 1 TABLET BY MOUTH EVERY DAY 01/02/17   Herminio Commons, MD  lidocaine (LIDODERM) 5 % PLACE 1 PATCH ONTO THE SKIN EVERY DAY. REMOVE AND DISCARD PATCH WITHIN 12 HOURS OR AS DIRECTED BY MD 06/18/14   Garvin Fila, MD  meclizine (ANTIVERT) 25 MG tablet Take 1 tablet by mouth daily. 05/09/16   [provider]  montelukast (SINGULAIR) 10 MG tablet Take 1 tablet (10 mg total) by mouth at bedtime. 04/19/12   Dunn, Dayna N, PA-C  NITROSTAT 0.4 MG SL tablet PLACE 1 TABLET UNDER TONGUE EVERY 5 MINUTES FOR 3 DOSES AS NEEDED FOR CHEST PAIN 02/16/14   Carlena Bjornstad, MD  NON FORMULARY CPAP nightly    [provider]  NON FORMULARY Oxygen at 2L/min while out, prn other times.    [provider]  NOVOLOG FLEXPEN 100 UNIT/ML FlexPen Inject 0-15 Units into the skin 3 (three) times daily. 0-15 units per sliding scale up to three times daily 03/12/15   [provider]  omeprazole (PRILOSEC) 40 MG capsule Take 1 capsule (40 mg total) by mouth 2 (two) times daily. 06/17/13   Carlena Bjornstad, MD  potassium chloride SA (K-DUR,KLOR-CON) 20 MEQ tablet Take 2 tablets (40 mEq total) by mouth 2 (two) times daily. 04/19/12   Dunn, Nedra Hai, PA-C  rOPINIRole (REQUIP) 2 MG tablet Take 2 mg by mouth at bedtime.    [provider]  temazepam (RESTORIL) 15 MG capsule Take 15 mg by mouth at bedtime as needed for sleep.    [provider]  tiotropium (SPIRIVA) 18 MCG inhalation capsule Place 18 mcg into inhaler and inhale daily.    [provider]  torsemide (DEMADEX) 20 MG tablet Take 40 mg by mouth 2 (two) times daily. Dose decrease 05/18/14    [provider]    Physical Exam: Vitals:   02/23/18 1923  BP: 121/79  Pulse: 91  Temp: 98.7 F (37.1 C)  TempSrc: Oral  SpO2: 99%  Weight: 129.4 kg  Height: 5' 6.5" (1.689 m)      Constitutional: NAD, calm, comfortable,  confused Vitals:  02/23/18 1923  BP: 121/79  Pulse: 91  Temp: 98.7 F (37.1 C)  TempSrc: Oral  SpO2: 99%  Weight: 129.4 kg  Height: 5' 6.5" (1.689 m)   Eyes: PERRL, lids and conjunctivae normal ENMT: Mucous membranes are moist. Posterior pharynx clear of any exudate or lesions.Normal dentition.  Neck: normal, supple, no masses, no thyromegaly Respiratory: clear to auscultation bilaterally, no wheezing, no crackles. Normal respiratory effort. No accessory muscle use.  Cardiovascular: Irregularly irregular rate and rhythm, no murmurs / rubs / gallops.1+ extremity edema. 2+ pedal pulses. No carotid bruits.  Abdomen: no tenderness, no masses palpated. No hepatosplenomegaly. Bowel sounds positive.  Musculoskeletal: no clubbing / cyanosis. No joint deformity upper and lower extremities. Good ROM, no contractures. Normal muscle tone.  Skin: no rashes, lesions, ulcers. No induration Neurologic: CN 2-12 grossly intact. Sensation intact, DTR normal. Strength 5/5 in all 4.  Psychiatric: Confused, No judgement   Labs on Admission: I have personally reviewed following labs and imaging studies  CBC: No results for input(s): WBC, NEUTROABS, HGB, HCT, MCV, PLT in the last 168 hours. Basic Metabolic Panel: No results for input(s): NA, K, CL, CO2, GLUCOSE, BUN, CREATININE, CALCIUM, MG, PHOS in the last 168 hours. GFR: CrCl cannot be calculated (Patient's most recent lab result is older than the maximum 21 days allowed.). Liver Function Tests: No results for input(s): AST, ALT, ALKPHOS, BILITOT, PROT, ALBUMIN in the last 168 hours. No results for input(s): LIPASE, AMYLASE in the last 168 hours. No results for input(s): AMMONIA in the last 168 hours. Coagulation Profile: No results for input(s): INR, PROTIME in the last 168 hours. Cardiac Enzymes: No results for input(s): CKTOTAL, CKMB, CKMBINDEX, TROPONINI in the last 168 hours. BNP (last 3 results) No results for input(s): PROBNP in the last  8760 hours. HbA1C: No results for input(s): HGBA1C in the last 72 hours. CBG: No results for input(s): GLUCAP in the last 168 hours. Lipid Profile: No results for input(s): CHOL, HDL, LDLCALC, TRIG, CHOLHDL, LDLDIRECT in the last 72 hours. Thyroid Function Tests: No results for input(s): TSH, T4TOTAL, FREET4, T3FREE, THYROIDAB in the last 72 hours. Anemia Panel: No results for input(s): VITAMINB12, FOLATE, FERRITIN, TIBC, IRON, RETICCTPCT in the last 72 hours. Urine analysis:    Component Value Date/Time   COLORURINE YELLOW 03/26/2012 0100   APPEARANCEUR CLOUDY (A) 03/26/2012 0100   LABSPEC 1.024 03/26/2012 0100   PHURINE 5.5 03/26/2012 0100   GLUCOSEU NEGATIVE 03/26/2012 0100   HGBUR LARGE (A) 03/26/2012 0100   BILIRUBINUR NEGATIVE 03/26/2012 0100   KETONESUR NEGATIVE 03/26/2012 0100   PROTEINUR 100 (A) 03/26/2012 0100   UROBILINOGEN 1.0 03/26/2012 0100   NITRITE NEGATIVE 03/26/2012 0100   LEUKOCYTESUR MODERATE (A) 03/26/2012 0100   Sepsis Labs: @LABRCNTIP (procalcitonin:4,lacticidven:4) )No results found for this or any previous visit (from the past 240 hour(s)).   Radiological Exams on Admission: No results found.  EKG: Independently reviewed.  EKG showed atrial fibrillation with controlled rate.  Assessment/Plan Principal Problem:   Orthostatic hypotension Active Problems:   Morbid obesity (HCC)   Thrombocytopenia (HCC)   Paroxysmal atrial fibrillation (HCC)   Sleep apnea   A-fib (HCC)   UTI (urinary tract infection)   Metabolic encephalopathy     #1 orthostatic hypotension: This was originally reason for patient's transfer.  We will check orthostatics here.  She appears to be hydrated adequately for now.  Will have gentle hydration and monitor closely.  PT and OT evaluation.  #2 altered mental status: Not clear if this  is due to hospital delirium.  Family believe she is not at her baseline.  UTI still likely.  I will continue with IV antibiotics and  monitor.  #3 diabetes: Blood sugar is controlled.  Will initiate sliding scale insulin.  #4 hypertension: Continue blood pressure medications once updated.  #5 chronic kidney disease stage III: Follow BUN/creatinine closely.  #6 UTI: Urine cultures will be obtained.  Empirically start on Rocephin.  Patient however is penicillin allergic if not tolerated we will try aztreonam.  #7 atrial fibrillation: Rate is now controlled.  Continue home regimen.  #8 morbid obesity: Dietary counseling.  #9 systolic dysfunction CHF: EF 35 to 40%. appears to be slightly compensated.  Continue home regimen but avoid overdiuresis.  DVT prophylaxis: Heparin Code Status: Full code Family Communication: Son at bedside Disposition Plan: To be determined Consults called: None Admission status: Inpatient  Severity of Illness: The appropriate patient status for this patient is INPATIENT. Inpatient status is judged to be reasonable and necessary in order to provide the required intensity of service to ensure the patient's safety. The patient's presenting symptoms, physical exam findings, and initial radiographic and laboratory data in the context of their chronic comorbidities is felt to place them at high risk for further clinical deterioration. Furthermore, it is not anticipated that the patient will be medically stable for discharge from the hospital within 2 midnights of admission. The following factors support the patient status of inpatient.   " The patient's presenting symptoms include altered mental status and orthostasis. " The worrisome physical exam findings include confusion and disorientation. " The initial radiographic and laboratory data are worrisome because of no obvious findings on x-ray except for pulmonary congestion. " The chronic co-morbidities include coronary artery disease with CHF.   * I certify that at the point of admission it is my clinical judgment that the patient will require  inpatient hospital care spanning beyond 2 midnights from the point of admission due to high intensity of service, high risk for further deterioration and high frequency of surveillance required.Barbette Merino MD [facility] Pager [phone]  If 7PM-7AM, please contact night-coverage [email or web address] Password [facility]  02/23/2018, 8:43 PM

## 2018-02-23 NOTE — Progress Notes (Signed)
ANTICOAGULATION CONSULT NOTE - Initial Consult  Pharmacy Consult for Heparin Indication: atrial fibrillation  Allergies  Allergen Reactions  . Penicillins Anaphylaxis  . Lyrica [Pregabalin] Swelling  . Rocephin [Ceftriaxone Sodium In Dextrose]     Rash/hives/itching  . Azithromycin Hives and Rash  . Bactrim [Sulfamethoxazole-Trimethoprim] Hives and Rash  . Catapres [Clonidine Hcl] Itching and Rash  . Clonidine Derivatives Itching and Rash  . Sulfa Antibiotics Hives and Rash    Patient Measurements: Height: 5' 6.5" (168.9 cm) Weight: 285 lb 4.4 oz (129.4 kg) IBW/kg (Calculated) : 60.45 Heparin Dosing Weight: 93kg  Vital Signs: Temp: 98.7 F (37.1 C) (11/26 1923) Temp Source: Oral (11/26 1923) BP: 121/79 (11/26 1923) Pulse Rate: 91 (11/26 1923)  Labs: No results for input(s): HGB, HCT, PLT, APTT, LABPROT, INR, HEPARINUNFRC, HEPRLOWMOCWT, CREATININE, CKTOTAL, CKMB, TROPONINI in the last 72 hours.  CrCl cannot be calculated (Patient's most recent lab result is older than the maximum 21 days allowed.).   Medical History: Past Medical History:  Diagnosis Date  . Anemia   . Anginal pain ([place])   . Arthritis   . Asthma   . CAD (coronary artery disease)    a. CABG 2001. b. PCI 2011. c. NSTEMI 12/13-03/2012 in prolonged hosp stay, felt demand isch with stable cath.  . Cancer [facility])    hysterectomy 1979 r/t/ cancer  . Cardiomyopathy, ischemic   . Chronic kidney disease   . Chronic systolic CHF (congestive heart failure) ([place])    a. EF 40% 2011, down to 25% 03/2012 felt mixed isch/nonischemic (tachy-mediated).  . Complication of anesthesia   . COPD (chronic obstructive pulmonary disease) ([place])   . CVA (cerebral infarction)    Followed by Dr. Leonie Man  . Depression   . Diabetes mellitus ([place])   . Dizziness   . Ejection fraction    EF 40% in the past   //    EF 25% January, 2014   //   EF 30%, echo, Aug 11, 2012, mid/apical anterior akinesis and anteroseptal akinesis and apical  lateral akinesis and akinesis of the true apex.  . H/O hiatal hernia   . Headache(784.0)   . Hx of amiodarone therapy    Atrial fibrillation  . HX: anticoagulation    Atrial fibrillation  . Hypertension   . Morbid obesity ([place])   . Myocardial infarction [facility])    [phone] . 2011 2014  . On home oxygen therapy   . Persistent atrial fibrillation ([place])    a. Diagnosed 02/2012 - long hosp stay, difficult to control rates, LAA thrombus noted, needed emergent DCCV for VT/afib/hypotension 04/14/2012.  Marland Kitchen Pneumonia    2014  . Pulmonary hypertension (HCC)    PA pressure 65 mmHg, echo, Aug 11, 2012  . Restless leg   . Shortness of breath dyspnea   . Sleep apnea    CPAP qhs  . Stroke [facility])    a. Stroke 02/2012 with recurrent CVA in-hospital 10/2798 felt embolic.  Marland Kitchen Sweating    May, 2014  . Thrombocytopenia ([place])   . Thrombus of left atrial appendage    a. Possible LAA thrombus on TEE 04/01/12.     Assessment: 72yof transferred to [facility] from [facility] for altered MS.  Treated for UTI with macrobid > levofloxacon, wbc wnl, afebrile new UA pending.  Afib with RVR on amiodarone and apixaban. On admit MD holding apixaban and starting heparin drip.  Apixaban will falsely elevate Heparin level so will use aptt to dose heparin for now.  Last h/h stable from OSH. No bleeding noted.  Goal of Therapy:  Heparin level 0.3-0.7 units/ml aPTT 66-102 sec seconds Monitor platelets by anticoagulation protocol: Yes   Plan:  No bolus Heparin drip 1100 uts/hr Daily HL, CBC, aptt  Bonnita Nasuti Pharm.D. CPP, BCPS Clinical Pharmacist [phone] 02/23/2018 9:33 PM

## 2018-02-23 NOTE — Progress Notes (Addendum)
Upon attempting to start pt's heparin, LFA IV had blown. 2 IV team RNs attempted PIV placement and were unable to do so. Lab also had difficulty getting blood draws for labs. Pt and her son state that pt has had PICCs in the past. Paged Garba MD and awaiting new orders.   0020: paged back and awaiting new orders  0046: Schorr paged, PICC order placed. Holding off on heparin gtt/eliquis as HR is currently controlled in the 80s-90s. Advised to page back if rates climb up. Will continue to closely monitor pt.   0053: Garba called, ordered Lovenox for VTE to replace heparin. Awaiting PICC placement as pt currently has no IV access   0300: PICC placed in R arm, placement confirmed by xray. Given first dose of Lovenox, will continue to monitor.

## 2018-02-23 NOTE — Progress Notes (Signed)
2nd I.V. RN to attempt unsuccessfully to gain PIV. This attempt made with ultrasound. Patients states PICC placements in the past.

## 2018-02-24 ENCOUNTER — Inpatient Hospital Stay: Payer: Self-pay

## 2018-02-24 ENCOUNTER — Inpatient Hospital Stay (HOSPITAL_COMMUNITY): Payer: 59

## 2018-02-24 ENCOUNTER — Encounter (HOSPITAL_COMMUNITY): Payer: Self-pay

## 2018-02-24 DIAGNOSIS — I951 Orthostatic hypotension: Secondary | ICD-10-CM

## 2018-02-24 LAB — URINALYSIS, ROUTINE W REFLEX MICROSCOPIC
Bilirubin Urine: NEGATIVE
GLUCOSE, UA: NEGATIVE mg/dL
KETONES UR: NEGATIVE mg/dL
NITRITE: NEGATIVE
PH: 5 (ref 5.0–8.0)
Protein, ur: NEGATIVE mg/dL
Specific Gravity, Urine: 1.006 (ref 1.005–1.030)

## 2018-02-24 LAB — CBC
HCT: 31.7 % — ABNORMAL LOW (ref 36.0–46.0)
HEMOGLOBIN: 9.2 g/dL — AB (ref 12.0–15.0)
MCH: 27 pg (ref 26.0–34.0)
MCHC: 29 g/dL — ABNORMAL LOW (ref 30.0–36.0)
MCV: 93 fL (ref 80.0–100.0)
NRBC: 0 % (ref 0.0–0.2)
PLATELETS: 106 10*3/uL — AB (ref 150–400)
RBC: 3.41 MIL/uL — AB (ref 3.87–5.11)
RDW: 15.9 % — ABNORMAL HIGH (ref 11.5–15.5)
WBC: 3.8 10*3/uL — ABNORMAL LOW (ref 4.0–10.5)

## 2018-02-24 LAB — BASIC METABOLIC PANEL
Anion gap: 8 (ref 5–15)
BUN: 22 mg/dL (ref 8–23)
CHLORIDE: 98 mmol/L (ref 98–111)
CO2: 35 mmol/L — ABNORMAL HIGH (ref 22–32)
CREATININE: 1.58 mg/dL — AB (ref 0.44–1.00)
Calcium: 8.7 mg/dL — ABNORMAL LOW (ref 8.9–10.3)
GFR calc Af Amer: 37 mL/min — ABNORMAL LOW (ref >60)
GFR calc non Af Amer: 32 mL/min — ABNORMAL LOW (ref >60)
Glucose, Bld: 108 mg/dL — ABNORMAL HIGH (ref 70–99)
Potassium: 3.4 mmol/L — ABNORMAL LOW (ref 3.5–5.1)
SODIUM: 141 mmol/L (ref 135–145)

## 2018-02-24 LAB — GLUCOSE, CAPILLARY
GLUCOSE-CAPILLARY: 99 mg/dL (ref 70–99)
Glucose-Capillary: 97 mg/dL (ref 70–99)
Glucose-Capillary: 101 mg/dL — ABNORMAL HIGH (ref 70–99)
Glucose-Capillary: 86 mg/dL (ref 70–99)

## 2018-02-24 LAB — LIPID PANEL
CHOL/HDL RATIO: 3.4 ratio
Cholesterol: 99 mg/dL (ref 0–200)
HDL: 29 mg/dL — AB (ref >40)
LDL Cholesterol: 49 mg/dL (ref 0–99)
Triglycerides: 104 mg/dL (ref <150)
VLDL: 21 mg/dL (ref 0–40)

## 2018-02-24 LAB — TSH: TSH: 5.989 u[IU]/mL — ABNORMAL HIGH (ref 0.350–4.500)

## 2018-02-24 LAB — MRSA PCR SCREENING: MRSA by PCR: POSITIVE — AB

## 2018-02-24 MED ORDER — TEMAZEPAM 7.5 MG PO CAPS
7.5000 mg | ORAL_CAPSULE | Freq: Every evening | ORAL | Status: DC | PRN
  Administered 2018-02-24 – 2018-03-03 (×2): 7.5 mg via ORAL
  Filled 2018-02-24 (×4): qty 1

## 2018-02-24 MED ORDER — INSULIN ASPART 100 UNIT/ML ~~LOC~~ SOLN: 0.0000 [IU] | Freq: Every day | SUBCUTANEOUS | Status: DC

## 2018-02-24 MED ORDER — IPRATROPIUM BROMIDE 0.02 % IN SOLN
0.5000 mg | Freq: Four times a day (QID) | RESPIRATORY_TRACT | Status: DC | PRN
  Administered 2018-02-25: 0.5 mg via RESPIRATORY_TRACT
  Filled 2018-02-24: qty 2.5

## 2018-02-24 MED ORDER — CHLORHEXIDINE GLUCONATE CLOTH 2 % EX PADS
6.0000 | MEDICATED_PAD | Freq: Every morning | CUTANEOUS | Status: AC
  Administered 2018-02-24 – 2018-02-28 (×5): 6 via TOPICAL

## 2018-02-24 MED ORDER — TEMAZEPAM 7.5 MG PO CAPS
15.0000 mg | ORAL_CAPSULE | Freq: Every evening | ORAL | Status: DC | PRN
  Administered 2018-02-24: 15 mg via ORAL
  Filled 2018-02-24: qty 2

## 2018-02-24 MED ORDER — MUPIROCIN 2 % EX OINT
1.0000 "application " | TOPICAL_OINTMENT | Freq: Two times a day (BID) | CUTANEOUS | Status: AC
  Administered 2018-02-24 – 2018-02-28 (×10): 1 via NASAL
  Filled 2018-02-24 (×2): qty 22

## 2018-02-24 MED ORDER — APIXABAN 5 MG PO TABS: 5.0000 mg | ORAL_TABLET | Freq: Two times a day (BID) | ORAL | Status: DC

## 2018-02-24 MED ORDER — CARVEDILOL 3.125 MG PO TABS
3.1250 mg | ORAL_TABLET | Freq: Two times a day (BID) | ORAL | Status: DC
  Administered 2018-02-24 – 2018-02-25 (×2): 3.125 mg via ORAL
  Filled 2018-02-24 (×2): qty 1

## 2018-02-24 MED ORDER — SODIUM CHLORIDE 0.9% FLUSH: 10.0000 mL | INTRAVENOUS | Status: DC | PRN

## 2018-02-24 MED ORDER — AMIODARONE HCL 200 MG PO TABS
200.0000 mg | ORAL_TABLET | Freq: Every day | ORAL | Status: DC
  Administered 2018-02-24 – 2018-02-25 (×2): 200 mg via ORAL
  Filled 2018-02-24 (×2): qty 1

## 2018-02-24 MED ORDER — APIXABAN 5 MG PO TABS
5.0000 mg | ORAL_TABLET | Freq: Two times a day (BID) | ORAL | Status: DC
  Administered 2018-02-24 – 2018-03-05 (×18): 5 mg via ORAL
  Filled 2018-02-24 (×18): qty 1

## 2018-02-24 MED ORDER — SODIUM CHLORIDE 0.9 % IV SOLN
1.0000 g | Freq: Three times a day (TID) | INTRAVENOUS | Status: DC
  Administered 2018-02-24 (×2): 1 g via INTRAVENOUS
  Filled 2018-02-24 (×3): qty 1

## 2018-02-24 MED ORDER — INSULIN ASPART 100 UNIT/ML ~~LOC~~ SOLN
0.0000 [IU] | Freq: Three times a day (TID) | SUBCUTANEOUS | Status: DC
  Administered 2018-02-25: 1 [IU] via SUBCUTANEOUS
  Administered 2018-03-01: 2 [IU] via SUBCUTANEOUS
  Administered 2018-03-05: 1 [IU] via SUBCUTANEOUS

## 2018-02-24 MED ORDER — ENOXAPARIN SODIUM 150 MG/ML ~~LOC~~ SOLN
130.0000 mg | Freq: Two times a day (BID) | SUBCUTANEOUS | Status: DC
  Administered 2018-02-24: 130 mg via SUBCUTANEOUS
  Filled 2018-02-24 (×2): qty 0.87

## 2018-02-24 MED ORDER — ENOXAPARIN SODIUM 150 MG/ML ~~LOC~~ SOLN
130.0000 mg | Freq: Two times a day (BID) | SUBCUTANEOUS | Status: DC
  Filled 2018-02-24: qty 0.87

## 2018-02-24 MED ORDER — POTASSIUM CHLORIDE CRYS ER 20 MEQ PO TBCR
40.0000 meq | EXTENDED_RELEASE_TABLET | Freq: Once | ORAL | Status: AC
  Administered 2018-02-24: 40 meq via ORAL
  Filled 2018-02-24: qty 2

## 2018-02-24 NOTE — Progress Notes (Signed)
ANTICOAGULATION CONSULT NOTE - Initial Consult  Pharmacy Consult for lovenox Indication: atrial fibrillation  Allergies  Allergen Reactions  . Penicillins Anaphylaxis  . Lyrica [Pregabalin] Swelling  . Rocephin [Ceftriaxone Sodium In Dextrose]     Rash/hives/itching  . Azithromycin Hives and Rash  . Bactrim [Sulfamethoxazole-Trimethoprim] Hives and Rash  . Catapres [Clonidine Hcl] Itching and Rash  . Clonidine Derivatives Itching and Rash  . Sulfa Antibiotics Hives and Rash    Patient Measurements: Height: 5' 6.5" (168.9 cm) Weight: 285 lb 4.4 oz (129.4 kg) IBW/kg (Calculated) : 60.45 Heparin Dosing Weight: 93kg  Vital Signs: Temp: 98.7 F (37.1 C) (11/26 1923) Temp Source: Oral (11/26 1923) BP: 121/79 (11/26 1923) Pulse Rate: 91 (11/26 1923)  Labs: Recent Labs    02/23/18 2228  HGB 10.2*  HCT 35.6*  PLT 126*  CREATININE 1.61*    Estimated Creatinine Clearance: 43.9 mL/min (A) (by C-G formula based on SCr of 1.61 mg/dL (H)).      Assessment: 72yof transferred [facility] from [facility] for altered MS.  Treated for UTI with macrobid > levofloxacon, wbc wnl, afebrile new UA pending.  Afib with RVR on amiodarone and apixaban. On admit MD holding apixaban and starting heparin drip. Unable to obtain IV access so changed to sq lovenox Goal of Therapy:  Monitor platelets by anticoagulation protocol: Yes   Plan:  Lovenox 130 mg sq q12 hours CBC q3 days  Thanks for allowing pharmacy to be a part of this patient's care.  Excell Seltzer, PharmD Clinical Pharmacist  02/24/2018 3:21 AM

## 2018-02-24 NOTE — Progress Notes (Signed)
Pharmacy Antibiotic Note  Caitlyn [place] is a 72 y.o. female admitted on 02/23/2018 with AMS/UTI.  Pharmacy has been consulted for Aztreonam dosing.  Plan: Aztreonam 1 g IV q8h  Height: 5' 6.5" (168.9 cm) Weight: 285 lb 4.4 oz (129.4 kg) IBW/kg (Calculated) : 60.45  Temp (24hrs), Avg:98.7 F (37.1 C), Min:98.7 F (37.1 C), Max:98.7 F (37.1 C)  Recent Labs  Lab 02/23/18 2228  WBC 4.6  CREATININE 1.61*    Estimated Creatinine Clearance: 43.9 mL/min (A) (by C-G formula based on SCr of 1.61 mg/dL (H)).    Allergies  Allergen Reactions  . Penicillins Anaphylaxis  . Lyrica [Pregabalin] Swelling  . Rocephin [Ceftriaxone Sodium In Dextrose]     Rash/hives/itching  . Azithromycin Hives and Rash  . Bactrim [Sulfamethoxazole-Trimethoprim] Hives and Rash  . Catapres [Clonidine Hcl] Itching and Rash  . Clonidine Derivatives Itching and Rash  . Sulfa Antibiotics Hives and Rash    Caryl Pina 02/24/2018 12:57 AM

## 2018-02-24 NOTE — Progress Notes (Signed)
Patients home CPAP is set up in the room by family or RN. Cords are in good condition. RN will call if patient needs additional assistance.

## 2018-02-24 NOTE — Consult Note (Addendum)
Cardiology Consultation:   Patient ID: Caitlyn Hardy; [id number]; 1945-11-12   Admit date: 02/23/2018 Date of Consult: 02/24/2018  Primary Care Provider: [name], MD Primary Cardiologist: Caitlyn Sable, MD Primary Electrophysiologist:     Patient Profile:   Caitlyn Hardy is a 72 y.o. female with a CAD s/p CABG in 2001 with subsequent PCI of LCx in 9562, chronic systolic CHF (EF 13-08% on last echo 12/2017), paroxysmal atrial fibrillation, DM type 2, COPD on home O2 2-3L, OSA on CPAP, CVA, CKD stage 3, and morbid obesity, who is being seen today for the evaluation of atrial fibrillation with RVR at the request of Dr. Cyndia Hardy.  History of Present Illness:   Caitlyn Hardy initially presented to [facility] in [place] on 02/14/18 with AMS felt to be 2/2 UTI. During her admission, she was evaluated by cardiology in consultation for acute on chronic systolic CHF, chest pain, and atrial fibrillation. She was diuresed with IV lasix 40mg  BID with improvement in SOB and transitioned to torsemide 40mg  daily. She had negative troponins at that time and EKG was reportedly non-ischemic. Her carvedilol was discontinued and she was transitioned to metoprolol succinate 25mg  BID for better rate control of her afib.   She was last seen outpatient by cardiology, Caitlyn Pho, PA-C 10/2017. She had a repeat echocardiogram 12/2017 which showed EF 25-30% (previously 30-35% in 08/2017) . She was recommended to continue coreg, entresto, and torsemide at that time and further consideration regarding an ischemic evaluation would occur pending review of outside records (possible recent stress test?). Also mentioned she would likely need EP referral for ICD consideration.   Two sons at bedside assist with history taking as patient is still experiencing some confusion. Sons requested her transfer from [place] to [facility] given lack of improvement in their mothers physical/mental status. They report that  she has been experiencing worsening DOE for the past 2 months. She has reported occasional chest pain during that timeframe as well. Caitlyn Hardy has noticed she has been intermittently non-compliant with her medications due to declining mental status. No formal dementia diagnosis has been made.   Hospital course: VSS with borderline orthostatic vitals with stable SBP but drop in DBP. Cr is at baseline, Hgb 10.2>9.2, PLT 126>106, TSH elevated to 5.9, LDL 49. EKG with atrial fibrillation with rate 93, non-ischemic. IV antibiotics for UTI discontinued today.   Past Medical History:  Diagnosis Date  . Anemia   . Anginal pain ([place])   . Arthritis   . Asthma   . CAD (coronary artery disease)    a. CABG 2001. b. PCI 2011. c. NSTEMI 12/13-03/2012 in prolonged hosp stay, felt demand isch with stable cath.  . Cancer [facility])    hysterectomy 1979 r/t/ cancer  . Cardiomyopathy, ischemic   . Chronic kidney disease   . Chronic systolic CHF (congestive heart failure) ([place])    a. EF 40% 2011, down to 25% 03/2012 felt mixed isch/nonischemic (tachy-mediated).  . Complication of anesthesia   . COPD (chronic obstructive pulmonary disease) ([place])   . CVA (cerebral infarction)    Followed by Dr. Leonie Man  . Depression   . Diabetes mellitus ([place])   . Dizziness   . Ejection fraction    EF 40% in the past   //    EF 25% January, 2014   //   EF 30%, echo, Aug 11, 2012, mid/apical anterior akinesis and anteroseptal akinesis and apical lateral akinesis and akinesis of the true apex.  . H/O hiatal  hernia   . Headache(784.0)   . Hx of amiodarone therapy    Atrial fibrillation  . HX: anticoagulation    Atrial fibrillation  . Hypertension   . Morbid obesity ([place])   . Myocardial infarction [facility])    [phone] . 2011 2014  . On home oxygen therapy   . Persistent atrial fibrillation    a. Diagnosed 02/2012 - long hosp stay, difficult to control rates, LAA thrombus noted, needed emergent DCCV for VT/afib/hypotension  04/14/2012.  Marland Kitchen Pneumonia    2014  . Pulmonary hypertension ([facility])    PA pressure 65 mmHg, echo, Aug 11, 2012  . Restless leg   . Shortness of breath dyspnea   . Sleep apnea    CPAP qhs  . Stroke [facility])    a. Stroke 02/2012 with recurrent CVA in-hospital 0/4888 felt embolic.  Marland Kitchen Sweating    May, 2014  . Thrombocytopenia ([facility])   . Thrombus of left atrial appendage    a. Possible LAA thrombus on TEE 04/01/12.    Past Surgical History:  Procedure Laterality Date  . ;    . ABDOMINAL HYSTERECTOMY    . CARDIAC CATHETERIZATION  2013  . CARDIOVERSION N/A 06/02/2014   Procedure: CARDIOVERSION;  Surgeon: Herminio Commons, MD;  Location: [facility] ORS;  Service: Endoscopy;  Laterality: N/A;  . CORONARY ARTERY BYPASS GRAFT  2001   x 4 vessels  . ESOPHAGOGASTRODUODENOSCOPY N/A 04/10/2015   Procedure: ESOPHAGOGASTRODUODENOSCOPY (EGD);  Surgeon: Laurence Spates, MD;  Location: [facility] ENDOSCOPY;  Service: Endoscopy;  Laterality: N/A;  . knee replacemnt  2000   total right  . LEFT AND RIGHT HEART CATHETERIZATION WITH CORONARY/GRAFT ANGIOGRAM N/A 03/30/2012   Procedure: LEFT AND RIGHT HEART CATHETERIZATION WITH Beatrix Fetters;  Surgeon: Sherren Mocha, MD;  Location: [facility] CATH LAB;  Service: Cardiovascular;  Laterality: N/A;  . rhinosplasty     x 2  . shoulder sx  2000   right bone spurs  . TEE WITHOUT CARDIOVERSION  04/01/2012   Procedure: TRANSESOPHAGEAL ECHOCARDIOGRAM (TEE);  Surgeon: Thayer Headings, MD;  Location: [place];  Service: Cardiovascular;  Laterality: N/A;  . TONSILLECTOMY       Home Medications:  Prior to Admission medications   Medication Sig Start Date End Date Taking? Authorizing Provider  amiodarone (PACERONE) 200 MG tablet TAKE 1 TABLET BY MOUTH DAILY Patient taking differently: Take 200 mg by mouth daily.  11/04/16  Yes Herminio Commons, MD  atorvastatin (LIPITOR) 40 MG tablet Take 1 tablet every day Patient taking differently: Take 40 mg by mouth daily.  09/15/17  Yes  Herminio Commons, MD  busPIRone (BUSPAR) 5 MG tablet Take 5 mg by mouth 2 (two) times daily.   Yes [provider]  carvedilol (COREG) 6.25 MG tablet Take 1 tablet (6.25 mg total) by mouth 2 (two) times daily. 01/22/18  Yes Branch, Alphonse Guild, MD  cyclobenzaprine (FLEXERIL) 10 MG tablet Take 10 mg by mouth 2 (two) times daily.   Yes [provider]  DULoxetine (CYMBALTA) 30 MG capsule Take 90 mg by mouth at bedtime.   Yes [provider]  DULoxetine (CYMBALTA) 60 MG capsule Take 60 mg by mouth daily.   Yes [provider]  ELIQUIS 5 MG TABS tablet Take 1 tablet (5 mg total) by mouth 2 (two) times daily. 01/22/18  Yes Branch, Alphonse Guild, MD  ENTRESTO 97-103 MG Take 1 tablet by mouth 2 (two) times daily. 01/22/18  Yes Branch, Alphonse Guild, MD  ergocalciferol (VITAMIN  D2) 1.25 MG (50000 UT) capsule Take 50,000 Units by mouth once a week.   Yes [provider]  gabapentin (NEURONTIN) 100 MG capsule Take 100 mg by mouth 3 (three) times daily.  11/16/17  Yes [provider]  isosorbide dinitrate (ISORDIL) 20 MG tablet Take 20 mg by mouth daily.   Yes [provider]  levothyroxine (SYNTHROID, LEVOTHROID) 25 MCG tablet Take 25 mcg by mouth every morning. 02/02/18  Yes [provider]  meclizine (ANTIVERT) 25 MG tablet Take 25 mg by mouth 2 (two) times daily.  05/09/16  Yes [provider]  metoprolol succinate (TOPROL-XL) 25 MG 24 hr tablet Take 25 mg by mouth 2 (two) times daily.   Yes [provider]  OLANZapine (ZYPREXA) 2.5 MG tablet Take 2.5 mg by mouth at bedtime.   Yes [provider]  rOPINIRole (REQUIP) 1 MG tablet Take 1-2 mg by mouth at bedtime.    Yes [provider]  spironolactone (ALDACTONE) 25 MG tablet Take 25 mg by mouth daily.   Yes [provider]  temazepam (RESTORIL) 22.5 MG capsule Take 15 mg by mouth at bedtime as needed for sleep.    Yes [provider]    torsemide (DEMADEX) 10 MG tablet Take 10 mg by mouth every other day.    Yes [provider]  isosorbide mononitrate (IMDUR) 30 MG 24 hr tablet TAKE 1 TABLET BY MOUTH DAILY Patient not taking: No sig reported 08/01/16   Herminio Commons, MD  isosorbide mononitrate (IMDUR) 30 MG 24 hr tablet TAKE 1 TABLET BY MOUTH ONCE A DAY(NEED TO MAKE APPT WITH DOCTOR) Patient not taking: No sig reported 12/29/16   Herminio Commons, MD  isosorbide mononitrate (IMDUR) 30 MG 24 hr tablet TAKE 1 TABLET BY MOUTH EVERY DAY Patient not taking: No sig reported 01/02/17   Herminio Commons, MD  lidocaine (LIDODERM) 5 % PLACE 1 PATCH ONTO THE SKIN EVERY DAY. REMOVE AND DISCARD PATCH WITHIN 12 HOURS OR AS DIRECTED BY MD Patient not taking: Reported on 02/24/2018 06/18/14   Garvin Fila, MD  montelukast (SINGULAIR) 10 MG tablet Take 1 tablet (10 mg total) by mouth at bedtime. Patient not taking: Reported on 02/24/2018 04/19/12   Dunn, Lisbeth Renshaw N, PA-C  NITROSTAT 0.4 MG SL tablet PLACE 1 TABLET UNDER TONGUE EVERY 5 MINUTES FOR 3 DOSES AS NEEDED FOR CHEST PAIN Patient not taking: No sig reported 02/16/14   Carlena Bjornstad, MD  NON FORMULARY CPAP nightly    [provider]  NON FORMULARY Oxygen at 2L/min while out, prn other times.    [provider]  omeprazole (PRILOSEC) 40 MG capsule Take 1 capsule (40 mg total) by mouth 2 (two) times daily. Patient not taking: Reported on 02/24/2018 06/17/13   Carlena Bjornstad, MD  potassium chloride SA (K-DUR,KLOR-CON) 20 MEQ tablet Take 2 tablets (40 mEq total) by mouth 2 (two) times daily. Patient not taking: Reported on 02/24/2018 04/19/12   Charlie Pitter, PA-C    Inpatient Medications: Scheduled Meds: . apixaban  5 mg Oral BID  . Chlorhexidine Gluconate Cloth  6 each Topical q morning - 10a  . insulin aspart  0-5 Units Subcutaneous QHS  . insulin aspart  0-9 Units Subcutaneous TID WC  . mouth rinse  15 mL Mouth Rinse BID  . mupirocin ointment   1 application Nasal BID   Continuous Infusions:  PRN Meds: acetaminophen, ondansetron (ZOFRAN) IV, sodium chloride flush, temazepam  Allergies:  Allergies  Allergen Reactions  . Penicillins Anaphylaxis  . Lyrica [Pregabalin] Swelling  . Rocephin [Ceftriaxone Sodium In Dextrose]     Rash/hives/itching  . Azithromycin Hives and Rash  . Bactrim [Sulfamethoxazole-Trimethoprim] Hives and Rash  . Catapres [Clonidine Hcl] Itching and Rash  . Clonidine Derivatives Itching and Rash  . Sulfa Antibiotics Hives and Rash    Social History:   Social History   Socioeconomic History  . Marital status: Single    Spouse name: Not on file  . Number of children: 4  . Years of education: GED  . Highest education level: Not on file  Occupational History  . Not on file  Social Needs  . Financial resource strain: Not on file  . Food insecurity:    Worry: Not on file    Inability: Not on file  . Transportation needs:    Medical: Not on file    Non-medical: Not on file  Tobacco Use  . Smoking status: Former Smoker    Types: Cigarettes    Start date: 08/15/1963    Last attempt to quit: 04/01/1975    Years since quitting: 42.9  . Smokeless tobacco: Never Used  Substance and Sexual Activity  . Alcohol use: No    Alcohol/week: 0.0 standard drinks  . Drug use: No  . Sexual activity: Not on file  Lifestyle  . Physical activity:    Days per week: Not on file    Minutes per session: Not on file  . Stress: Not on file  Relationships  . Social connections:    Talks on phone: Not on file    Gets together: Not on file    Attends religious service: Not on file    Active member of club or organization: Not on file    Attends meetings of clubs or organizations: Not on file    Relationship status: Not on file  . Intimate partner violence:    Fear of current or ex partner: Not on file    Emotionally abused: Not on file    Physically abused: Not on file    Forced sexual activity: Not on file   Other Topics Concern  . Not on file  Social History Narrative   Patient is single with 4 children   Patient has a GED   Patient is right handed   Patient drinks 2 cups daily    Family History:    Family History  Problem Relation Age of Onset  . CAD Unknown   . Heart disease Unknown      ROS:  Please see the history of present illness.   All other ROS reviewed and negative.     Physical Exam/Data:   Vitals:   02/23/18 1923 02/24/18 0335 02/24/18 1325  BP: 121/79 94/77 109/77  Pulse: 91  96  Resp: 15 (!) 25 (!) 26  Temp: 98.7 F (37.1 C) 97.8 F (36.6 C) 98.6 F (37 C)  TempSrc: Oral Axillary Oral  SpO2: 99% 98% 96%  Weight: 129.4 kg    Height: 5' 6.5" (1.689 m)      Intake/Output Summary (Last 24 hours) at 02/24/2018 1422 Last data filed at 02/24/2018 1326 Gross per 24 hour  Intake 100 ml  Output 1200 ml  Net -1100 ml   Filed Weights   02/23/18 1923  Weight: 129.4 kg   Body mass index is 45.35 kg/m.  General:  Morbidly obese lady laying in bed in no acute distress HEENT: sclera anicteric  Neck: no  JVD Vascular: No carotid bruits; distal pulses 2+ bilaterally Cardiac:  normal S1, S2; IRIR; no murmurs, rubs, or gallops appreciated Lungs: scattered wheezing and rhonchi at lung bases  Abd: NABS, soft, nontender, no hepatomegaly Ext: no edema Musculoskeletal:  No deformities, BUE and BLE strength normal and equal Skin: warm and dry  Neuro:  CNs 2-12 intact, no focal abnormalities noted Psych:  Normal affect   EKG:  The EKG was personally reviewed and demonstrates:  Atrial fibrillation - non-ischemic Telemetry:  Telemetry was personally reviewed and demonstrates:  Atrial fibrillation with rates in the 80s-100s  Relevant CV Studies: Echocardiogram 01/25/18: Study Conclusions  - Left ventricle: The cavity size was mildly to moderately dilated.   Systolic function was severely reduced. The estimated ejection   fraction was in the range of 25% to 30%.  The study is not   technically sufficient to allow evaluation of LV diastolic   function. - Regional wall motion abnormality: Akinesis of the mid-apical   anterior, mid anteroseptal, apical inferior, and apical   myocardium. - Aortic valve: Mildly to moderately calcified annulus. Trileaflet;   moderately thickened leaflets. Valve area (VTI): 1.88 cm^2. Valve   area (Vmax): 1.92 cm^2. - Mitral valve: Mildly calcified annulus. Mildly thickened leaflets   . There was mild regurgitation. - Left atrium: The atrium was moderately dilated. - Right ventricle: Systolic function was mildly reduced. - Right atrium: The atrium was mildly dilated. - Atrial septum: No defect or patent foramen ovale was identified. - Pulmonary arteries: Systolic pressure was severely increased. PA   peak pressure: 71 mm Hg (S). - Technically difficult study. Echocontrast was used to enhance   visualization. - Sluggish flow in the LV apex with mild swirling densities without   clear organized thrombus.  Right/Left heart catheterization 2013: Coronary angiography: Coronary dominance: right  Left mainstem: Patent with 30% distal left main stenosis  Left anterior descending (LAD): Totally occluded.  Left circumflex (LCx): Widely patent. The proximal and mid vessel is stented with no significant in-stent restenosis. There is a large obtuse marginal branch without significant stenosis.  Right coronary artery (RCA): Diffusely diseased in the mid vessel leading into a total occlusion in the distal vessel.  SVG to diagonal: The vein graft is widely patent with mild irregularity. The diagonal ranch itself is very small with no significant stenosis.  SVG to right PDA: Widely patent throughout. No significant stenosis. The PDA branch is patent and this retrograde fills other subbranches of the right coronary artery.  LIMA to LAD: Widely patent with no significant stenosis. The mid and distal LAD have mild diffuse  disease without significant stenosis. This graft retrograde fills a large diagonal branch without significant stenosis.  Left ventriculography: deferred  Final Conclusions:   1. severe native three-vessel coronary artery disease 2. Continued patency of the saphenous vein graft to PDA, saphenous vein graft to diagonal, and LIMA to LAD 3. Continued patency of the stented segment in the left circumflex 4. Severe congestive heart failure with markedly elevated right heart pressures and low cardiac output  Recommendations:  Discussed with Dr. Aundra Dubin. The patient will be managed by the heart failure service. I'm going to start her on IV amiodarone for rate control. She will be started on unfractionated heparin and warfarin for anticoagulation. Tentatively plan on TEE cardioversion January 2. Will start IV furosemide for diuresis. Will need to watch QTc with this patient on Amiodarone and Levaquin. Would consider stopping digoxin over next few days to limit potential medication  toxicity.  Laboratory Data:  Chemistry Recent Labs  Lab 02/23/18 2228 02/24/18 0324  NA 141 141  K 3.7 3.4*  CL 99 98  CO2 33* 35*  GLUCOSE 95 108*  BUN 24* 22  CREATININE 1.61* 1.58*  CALCIUM 9.0 8.7*  GFRNONAA 32* 32*  GFRAA 37* 37*  ANIONGAP 9 8    Recent Labs  Lab 02/23/18 2228  PROT 6.3*  ALBUMIN 3.7  AST 19  ALT 12  ALKPHOS 40  BILITOT 0.8   Hematology Recent Labs  Lab 02/23/18 2228 02/24/18 0324  WBC 4.6 3.8*  RBC 3.86* 3.41*  HGB 10.2* 9.2*  HCT 35.6* 31.7*  MCV 92.2 93.0  MCH 26.4 27.0  MCHC 28.7* 29.0*  RDW 15.9* 15.9*  PLT 126* 106*   Cardiac EnzymesNo results for input(s): TROPONINI in the last 168 hours. No results for input(s): TROPIPOC in the last 168 hours.  BNPNo results for input(s): BNP, PROBNP in the last 168 hours.  DDimer No results for input(s): DDIMER in the last 168 hours.  Radiology/Studies:  Dg Chest Port 1 View  Result Date: 02/24/2018 CLINICAL DATA:   PICC line placement EXAM: PORTABLE CHEST 1 VIEW COMPARISON:  02/23/2018 FINDINGS: Right-sided PICC line tip projects over the lower SVC. There are median sternotomy wires and sequelae of CABG. Unchanged cardiomegaly. Slightly increased central pulmonary vascular congestion and multifocal atelectasis of the right lung. IMPRESSION: Right-sided PICC line tip projects over the lower SVC. Electronically Signed   By: Ulyses Jarred M.D.   On: 02/24/2018 03:13   Dg Chest Port 1 View  Result Date: 02/23/2018 CLINICAL DATA:  Hypoxia EXAM: PORTABLE CHEST 1 VIEW COMPARISON:  04/03/2015 FINDINGS: Post sternotomy changes. Cardiomegaly with central vascular congestion. No pleural effusion. No pneumothorax. Tubing or lead over the right upper chest, if this is a vascular catheter takes a slightly unusual curved course distally. IMPRESSION: 1. Cardiomegaly with vascular congestion. 2. Catheter tubing or external lead over the right chest for which clinical correlation is recommended Electronically Signed   By: Donavan Foil M.D.   On: 02/23/2018 23:56   [place] Ekg Site Rite  Result Date: 02/24/2018 If Site Rite image not attached, placement could not be confirmed due to current cardiac rhythm.   Assessment and Plan:   1. Chronic combined CHF: last echo 01/25/18 with EF 25-30% (previously 30-35% 08/2017 at OSH). She was diuresed with IV lasix prior to transfer and recommended to resume torsemide 40mg  daily.  - Will restart low dose coreg - uptitrate as BP tolerates - Hold off on entresto, spironolactone, and torsemide for the time being  2. Paroxysmal atrial fibrillation: hospital course complicated by atrial fibrillation with RVR felt to be contributing to #1. She was transitioned from carvedilol to metoprolol succinate 25mg  BID. Her amiodarone was continued for rhythm control and apixaban continue for stroke ppx. She is currently in atrial fibrillation on telemetry with rates in the 90s/100s, although none of her  medications have been continued aside from apixaban.  - Will focus on rate/rhythm control by restarting low dose coreg and amiodarone  3. CAD s/p CABG with subsequent PCI to LCx in 2011: per cardiology consult at OSH she had complaints of chest pain. Troponins were negative and EKG reportedly non-ischemic. No ischemic evaluation occurred prior to transfer.  - No further ischemic testing at this time.   4. UTI: felt to be contributing to AMS at the time of initial presentation 02/14/18. Found to have Ecoli >100000 CFU at OSH, resistant to bactrim,  recommended to continue levaquin x3 days at time of transfer.  - Continue management per primary team  5. AMS: ongoing since presentation to OSH 02/14/18. Possible some underlying dementia with hospital associated delirium. Family requesting neurology evaluation for dementia - Continue management per primary team  6. COPD: on home O2 at baseline. Has some wheezing and rhonchi on exam today. Do not see any nebulizers or inhalers listed in her home medications.  - Will order prn ipratropium at this time and defer further management to primary team.    For questions or updates, please contact [place] Please consult [email or web address] for contact info under Cardiology/STEMI.   Signed, Abigail Butts, PA-C  02/24/2018 2:22 PM [phone]  History and all data above reviewed.  Patient examined.  I agree with the findings as above.   The patient is admitted with failure to thrive after hospitalization for UTI.  She has a history of cardiomyopathy and arrhythmia as above.  We are asked to consult for medication management.  She is here with persistent weakness and has had rapid heart rates with her PAF (seems to be persistent.)  She denies any acute symptoms.  She has chronic O2 dependent lung disease.  The patient exam reveals [id number]  ,  Lungs: Decreased breath sounds with wheezing  ,  Abd: Obese, Ext mild edema  .  All available labs, radiology  testing, previous records reviewed. Agree with documented assessment and plan.   Acute on chronic CHF:  I don't think that she has significant volume overload.  Her BP is not allowing aggressive med titration.  I would like to switch back to low dose Coreg tonight and titrate as we are able.  I will restart amiodarone which she has taken for rhythm and rate control.  I do not think that she needs aggressive diuresis.  Med titration will be slow.  Heart rate control will be imperfect.  The main issues will continue to be weakness, debility and her chronic disease.  I do not suspect that we will need any in patient invasive or non invasive work up.  There is a small chance she would need cardioversion if she has poor rate control.  It appears that her DOAC has been uninterrupted.    Jeneen Rinks Fitz Matsuo  6:01 PM  02/24/2018

## 2018-02-24 NOTE — Progress Notes (Signed)
PROGRESS NOTE  Caitlyn Hardy LOV:[id number] DOB: [birth year] DOA: 02/23/2018 PCP: [name], MD   LOS: 1 day   Brief Narrative / Interim history: 72 year old female with history of dementia, CAD status post CABG, A. Fib, systolic CHF (35 to 88%), COPD on 3-4L at home, history of CAD, history of OSA on CPAP, morbid obesity and CKD type III who was transferred from [place] with AMS, orthostatic hypotension and A. fib with RVR. On arrival, no signs of orthostasis per admitting provider.  Urinalysis concerning for UTI.  EKG with A. fib without RVR.  Chest x-ray with cardiomegaly and vascular congestion.  Creatinine 1.61.  Hemoglobin 10.2.  Platelet 1.6.   Subjective: No complaints this morning.  Reports feeling somewhat better overall.  Denies chest pain, dyspnea or urinary symptoms.  Reports problem with short-term memories.  Son at bedside cannot give much history.  He says he [place].  Another son has healthcare power of attorney.  Assessment & Plan: Principal Problem:   Orthostatic hypotension Active Problems:   Morbid obesity (HCC)   Thrombocytopenia (HCC)   Paroxysmal atrial fibrillation (HCC)   Sleep apnea   A-fib (HCC)   UTI (urinary tract infection)   Metabolic encephalopathy  Altered mental status: History of dementia problem with short-term memories.  She is alert and awake but oriented only to self and year.  Could be due to delirium.  UA concerning for UTI but patient without UTI symptoms.  -Continue delirium precaution -Reduce nightly Restoril to 7.5 mg -Delirium precautions.  Orthostatic hypotension: Vitals not impressive. -Check orthostatic vitals. -PT/OT consult  Abnormal urinalysis: Patient denies UTI.  Not sure if this is the cause of her mental status. -Discontinue antibiotic and watch.  ICM/systolic CHF/PHTN: Stable.  EF 25 to 30%, multiple regional wall motion abnormalities elevated PAPP of 71 mmHg on 01/17/2018.  Home cardiac meds  held out of concern for hypotension. -will consult card  Atrial fibrillation/history of left atrial impedance atrial thrombus -Continue home Eliquis  History of CAD s/p CABG in 2001 multiple left heart catheterization.  No anginal symptoms. -We will reintroduce cardiac meds as able  -Audiology consulted.  COPD  DM2 -Continue monitoring, mealtime insulin, sliding scale insulin   OSA -Nightly CPAP  Scheduled Meds: . apixaban  5 mg Oral BID  . Chlorhexidine Gluconate Cloth  6 each Topical q morning - 10a  . insulin aspart  0-5 Units Subcutaneous QHS  . insulin aspart  0-9 Units Subcutaneous TID WC  . mouth rinse  15 mL Mouth Rinse BID  . mupirocin ointment  1 application Nasal BID   Continuous Infusions: . aztreonam 1 g (02/24/18 0901)   PRN Meds:.acetaminophen, ondansetron (ZOFRAN) IV, sodium chloride flush, temazepam  DVT prophylaxis: On Eliquis Code Status: Full code Family Communication: Son at bedside. Disposition Plan: Remains inpatient pending clinical improvement.  Consultants:  -Cardiology  Procedures:   None  Antimicrobials:  Aztreonam 11/26 to 11/27  Objective: Vitals:   02/23/18 1923 02/24/18 0335 02/24/18 1325  BP: 121/79 94/77 109/77  Pulse: 91  96  Resp: 15 (!) 25 (!) 26  Temp: 98.7 F (37.1 C) 97.8 F (36.6 C) 98.6 F (37 C)  TempSrc: Oral Axillary Oral  SpO2: 99% 98% 96%  Weight: 129.4 kg    Height: 5' 6.5" (1.689 m)      Intake/Output Summary (Last 24 hours) at 02/24/2018 1349 Last data filed at 02/24/2018 1326 Gross per 24 hour  Intake 100 ml  Output 1200 ml  Net -1100 ml   Filed Weights   02/23/18 1923  Weight: 129.4 kg    Examination:  GENERAL: appears well, no ditress HEENT: PERRLA, MMM NECK: Supple. LUNGS:  No IWOB, good air movement, upper airway sounds transmitted throughout. HEART:  RRR with no M/R/G ABD:  Morbidly obese, soft, NT with active BS EXT: 2+ pitting edema bilaterally. NEURO:  awake, alert and  oriented only to self and year.. No gross deficit PSYCH: calm. Normal affect  Data Reviewed: I have independently reviewed following labs and imaging studies   CBC: Recent Labs  Lab 02/23/18 2228 02/24/18 0324  WBC 4.6 3.8*  NEUTROABS 2.6  --   HGB 10.2* 9.2*  HCT 35.6* 31.7*  MCV 92.2 93.0  PLT 126* 102*   Basic Metabolic Panel: Recent Labs  Lab 02/23/18 2228 02/24/18 0324  NA 141 141  K 3.7 3.4*  CL 99 98  CO2 33* 35*  GLUCOSE 95 108*  BUN 24* 22  CREATININE 1.61* 1.58*  CALCIUM 9.0 8.7*   GFR: Estimated Creatinine Clearance: 44.8 mL/min (A) (by C-G formula based on SCr of 1.58 mg/dL (H)). Liver Function Tests: Recent Labs  Lab 02/23/18 2228  AST 19  ALT 12  ALKPHOS 40  BILITOT 0.8  PROT 6.3*  ALBUMIN 3.7   No results for input(s): LIPASE, AMYLASE in the last 168 hours. No results for input(s): AMMONIA in the last 168 hours. Coagulation Profile: No results for input(s): INR, PROTIME in the last 168 hours. Cardiac Enzymes: No results for input(s): CKTOTAL, CKMB, CKMBINDEX, TROPONINI in the last 168 hours. BNP (last 3 results) No results for input(s): PROBNP in the last 8760 hours. HbA1C: No results for input(s): HGBA1C in the last 72 hours. CBG: Recent Labs  Lab 02/23/18 2053 02/24/18 0608 02/24/18 1126  GLUCAP 102* 97 101*   Lipid Profile: Recent Labs    02/24/18 0325  CHOL 99  HDL 29*  LDLCALC 49  TRIG 104  CHOLHDL 3.4   Thyroid Function Tests: Recent Labs    02/24/18 0324  TSH 5.989*   Anemia Panel: No results for input(s): VITAMINB12, FOLATE, FERRITIN, TIBC, IRON, RETICCTPCT in the last 72 hours. Urine analysis:    Component Value Date/Time   COLORURINE YELLOW 02/24/2018 0003   APPEARANCEUR CLEAR 02/24/2018 0003   LABSPEC 1.006 02/24/2018 0003   PHURINE 5.0 02/24/2018 0003   GLUCOSEU NEGATIVE 02/24/2018 0003   HGBUR SMALL (A) 02/24/2018 0003   BILIRUBINUR NEGATIVE 02/24/2018 0003   KETONESUR NEGATIVE 02/24/2018 0003    PROTEINUR NEGATIVE 02/24/2018 0003   UROBILINOGEN 1.0 03/26/2012 0100   NITRITE NEGATIVE 02/24/2018 0003   LEUKOCYTESUR LARGE (A) 02/24/2018 0003   Sepsis Labs: Invalid input(s): PROCALCITONIN, LACTICIDVEN  Recent Results (from the past 240 hour(s))  MRSA PCR Screening     Status: Abnormal   Collection Time: 02/23/18  9:16 PM  Result Value Ref Range Status   MRSA by PCR POSITIVE (A) NEGATIVE Final    Comment:        The GeneXpert MRSA Assay (FDA approved for NASAL specimens only), is one component of a comprehensive MRSA colonization surveillance program. It is not intended to diagnose MRSA infection nor to guide or monitor treatment for MRSA infections. RESULT CALLED TO, READ BACK BY AND VERIFIED WITHGearldine Shown RN [id number] 02/24/18 A BROWNING Performed at [facility], [place] [address] [place], [place] [address]       Radiology Studies: Dg Chest Port 1 View  Result Date: 02/24/2018 CLINICAL  DATA:  PICC line placement EXAM: PORTABLE CHEST 1 VIEW COMPARISON:  02/23/2018 FINDINGS: Right-sided PICC line tip projects over the lower SVC. There are median sternotomy wires and sequelae of CABG. Unchanged cardiomegaly. Slightly increased central pulmonary vascular congestion and multifocal atelectasis of the right lung. IMPRESSION: Right-sided PICC line tip projects over the lower SVC. Electronically Signed   By: Ulyses Jarred M.D.   On: 02/24/2018 03:13   Dg Chest Port 1 View  Result Date: 02/23/2018 CLINICAL DATA:  Hypoxia EXAM: PORTABLE CHEST 1 VIEW COMPARISON:  04/03/2015 FINDINGS: Post sternotomy changes. Cardiomegaly with central vascular congestion. No pleural effusion. No pneumothorax. Tubing or lead over the right upper chest, if this is a vascular catheter takes a slightly unusual curved course distally. IMPRESSION: 1. Cardiomegaly with vascular congestion. 2. Catheter tubing or external lead over the right chest for which clinical correlation is recommended Electronically  Signed   By: Donavan Foil M.D.   On: 02/23/2018 23:56   [place] Ekg Site Rite  Result Date: 02/24/2018 If Site Rite image not attached, placement could not be confirmed due to current cardiac rhythm.     Yazlynn Birkeland [facility] Pager [phone]  If 7PM-7AM, please contact night-coverage [email or web address] Password [email or web address] 02/24/2018, 1:49 PM

## 2018-02-24 NOTE — Progress Notes (Signed)
Peripherally Inserted Central Catheter/Midline Placement  The IV Nurse has discussed with the patient and/or persons authorized to consent for the patient, the purpose of this procedure and the potential benefits and risks involved with this procedure.  The benefits include less needle sticks, lab draws from the catheter, and the patient may be discharged home with the catheter. Risks include, but not limited to, infection, bleeding, blood clot (thrombus formation), and puncture of an artery; nerve damage and irregular heartbeat and possibility to perform a PICC exchange if needed/ordered by physician.  Alternatives to this procedure were also discussed.  Bard Power PICC patient education guide, fact sheet on infection prevention and patient information card has been provided to patient /or left at bedside.    PICC/Midline Placement Documentation  PICC Double Lumen 02/24/18 PICC Right Brachial 41 cm 0 cm (Active)  Indication for Insertion or Continuance of Line Poor Vasculature-patient has had multiple peripheral attempts or PIVs lasting less than 24 hours 02/24/2018  2:40 AM  Exposed Catheter (cm) 0 cm 02/24/2018  2:40 AM  Site Assessment Clean;Dry;Intact 02/24/2018  2:40 AM  Lumen #1 Status Blood return noted;Flushed;Saline locked 02/24/2018  2:40 AM  Lumen #2 Status Blood return noted;Flushed;Saline locked 02/24/2018  2:40 AM  Dressing Type Transparent;Securing device 02/24/2018  2:40 AM  Dressing Status Clean;Dry;Intact;Antimicrobial disc in place 02/24/2018  2:40 AM  Line Adjustment (NICU/IV Team Only) No 02/24/2018  2:40 AM  Dressing Intervention New dressing 02/24/2018  2:40 AM  Dressing Change Due 03/03/18 02/24/2018  2:40 AM       Aldona Lento L 02/24/2018, 2:53 AM

## 2018-02-24 NOTE — Progress Notes (Addendum)
Pt arrived from [facility] via [place]. CHG bath given, VSS, tele applied and verified x2. Pt denies CP or SOB. Pt is pleasantly confused, but oriented to self and place. Pt's son states that this confusion started a few weeks ago.    Garba MD up to see pt. Pharmacy contacted to rec meds, unable to do so until AM. Pt and her son updated on plan of care, oriented to room and call light. Will continue to monitor.

## 2018-02-25 ENCOUNTER — Encounter (HOSPITAL_COMMUNITY): Payer: Self-pay | Admitting: Pharmacy Technician

## 2018-02-25 DIAGNOSIS — I4891 Unspecified atrial fibrillation: Secondary | ICD-10-CM

## 2018-02-25 DIAGNOSIS — R0902 Hypoxemia: Secondary | ICD-10-CM

## 2018-02-25 DIAGNOSIS — I5022 Chronic systolic (congestive) heart failure: Secondary | ICD-10-CM

## 2018-02-25 DIAGNOSIS — I255 Ischemic cardiomyopathy: Secondary | ICD-10-CM

## 2018-02-25 DIAGNOSIS — G4733 Obstructive sleep apnea (adult) (pediatric): Secondary | ICD-10-CM

## 2018-02-25 LAB — GLUCOSE, CAPILLARY
GLUCOSE-CAPILLARY: 116 mg/dL — AB (ref 70–99)
GLUCOSE-CAPILLARY: 85 mg/dL (ref 70–99)
Glucose-Capillary: 136 mg/dL — ABNORMAL HIGH (ref 70–99)
Glucose-Capillary: 120 mg/dL — ABNORMAL HIGH (ref 70–99)

## 2018-02-25 MED ORDER — GABAPENTIN 100 MG PO CAPS
100.0000 mg | ORAL_CAPSULE | Freq: Three times a day (TID) | ORAL | Status: DC
  Administered 2018-02-25 – 2018-03-05 (×24): 100 mg via ORAL
  Filled 2018-02-25 (×24): qty 1

## 2018-02-25 MED ORDER — BUDESONIDE 0.25 MG/2ML IN SUSP
0.2500 mg | Freq: Two times a day (BID) | RESPIRATORY_TRACT | Status: DC
  Administered 2018-02-25 – 2018-03-05 (×13): 0.25 mg via RESPIRATORY_TRACT
  Filled 2018-02-25 (×17): qty 2

## 2018-02-25 MED ORDER — AMIODARONE HCL 200 MG PO TABS
200.0000 mg | ORAL_TABLET | Freq: Two times a day (BID) | ORAL | Status: DC
  Administered 2018-02-25 – 2018-03-05 (×16): 200 mg via ORAL
  Filled 2018-02-25 (×16): qty 1

## 2018-02-25 MED ORDER — DULOXETINE HCL 60 MG PO CPEP
60.0000 mg | ORAL_CAPSULE | Freq: Every day | ORAL | Status: DC
  Administered 2018-02-25 – 2018-03-04 (×8): 60 mg via ORAL
  Filled 2018-02-25 (×8): qty 1

## 2018-02-25 MED ORDER — ARFORMOTEROL TARTRATE 15 MCG/2ML IN NEBU
15.0000 ug | INHALATION_SOLUTION | Freq: Two times a day (BID) | RESPIRATORY_TRACT | Status: DC
  Administered 2018-02-25 – 2018-03-05 (×13): 15 ug via RESPIRATORY_TRACT
  Filled 2018-02-25 (×17): qty 2

## 2018-02-25 MED ORDER — CARVEDILOL 6.25 MG PO TABS
6.2500 mg | ORAL_TABLET | Freq: Two times a day (BID) | ORAL | Status: DC
  Administered 2018-02-25 – 2018-03-01 (×9): 6.25 mg via ORAL
  Filled 2018-02-25 (×9): qty 1

## 2018-02-25 MED ORDER — ROPINIROLE HCL 1 MG PO TABS
1.0000 mg | ORAL_TABLET | Freq: Every day | ORAL | Status: DC
  Administered 2018-02-25 – 2018-03-04 (×8): 1 mg via ORAL
  Filled 2018-02-25 (×8): qty 1

## 2018-02-25 MED ORDER — DULOXETINE HCL 60 MG PO CPEP: 90.0000 mg | ORAL_CAPSULE | Freq: Every day | ORAL | Status: DC

## 2018-02-25 MED ORDER — METOPROLOL TARTRATE 5 MG/5ML IV SOLN
INTRAVENOUS | Status: AC
  Filled 2018-02-25: qty 5

## 2018-02-25 MED ORDER — LEVOTHYROXINE SODIUM 25 MCG PO TABS
25.0000 ug | ORAL_TABLET | Freq: Every day | ORAL | Status: DC
  Administered 2018-02-25 – 2018-03-05 (×8): 25 ug via ORAL
  Filled 2018-02-25 (×10): qty 1

## 2018-02-25 MED ORDER — ALBUTEROL SULFATE (2.5 MG/3ML) 0.083% IN NEBU: 2.5000 mg | INHALATION_SOLUTION | Freq: Four times a day (QID) | RESPIRATORY_TRACT | Status: DC | PRN

## 2018-02-25 MED ORDER — LORAZEPAM 2 MG/ML IJ SOLN
1.0000 mg | Freq: Once | INTRAMUSCULAR | Status: AC
  Administered 2018-02-26: 1 mg via INTRAVENOUS
  Filled 2018-02-25: qty 1

## 2018-02-25 MED ORDER — LEVALBUTEROL HCL 0.63 MG/3ML IN NEBU: 0.6300 mg | INHALATION_SOLUTION | Freq: Four times a day (QID) | RESPIRATORY_TRACT | Status: DC | PRN

## 2018-02-25 MED ORDER — OLANZAPINE 2.5 MG PO TABS: 2.5000 mg | ORAL_TABLET | Freq: Every day | ORAL | Status: DC

## 2018-02-25 MED ORDER — METOPROLOL TARTRATE 5 MG/5ML IV SOLN
5.0000 mg | Freq: Once | INTRAVENOUS | Status: AC
  Administered 2018-02-25: 5 mg via INTRAVENOUS

## 2018-02-25 MED ORDER — FUROSEMIDE 10 MG/ML IJ SOLN
40.0000 mg | Freq: Once | INTRAMUSCULAR | Status: AC
  Administered 2018-02-25: 40 mg via INTRAVENOUS
  Filled 2018-02-25: qty 4

## 2018-02-25 MED ORDER — DULOXETINE HCL 60 MG PO CPEP: 60.0000 mg | ORAL_CAPSULE | Freq: Every day | ORAL | Status: DC

## 2018-02-25 NOTE — Progress Notes (Signed)
Pt has been confused, non compliant with care, self destructive behavior, pulled line and EKG, oxygen NCL and CPAP out. Initiated safety precaution with mittens, tele sitter monitoring and low bed alarm. Will continue to monitor.  Kennyth Lose, RN

## 2018-02-25 NOTE — Evaluation (Signed)
Physical Therapy Evaluation Patient Details Name: Caitlyn Hardy MRN: [id number] DOB: [birth year] Today's Date: 02/25/2018   History of Present Illness  72 y.o. female admitted on 02/23/18 for orthostatic hypotension, A-fib, and likely UTI.  Pt with significant PMH of thrombocytopenia, stroke, pulmonary HTN, PAF, MI, morbid obesity, HTN, DM, COPD, chronic systolic CHF, CAD, anemia, R shoulder surgery, R TKA, and CABG.    Clinical Impression  Pt is confused, restless, despite being basically oriented (she could not tell me why she was here at the hospital). She requires min to mod assist for short distance gait with RW around the end of the bed.  She would benefit from post acute rehab at discharge to get stronger and hopefully get more stable cognitively before returning home alone.   PT to follow acutely for deficits listed below.      Follow Up Recommendations SNF;Supervision/Assistance - 24 hour    Equipment Recommendations  None recommended by PT    Recommendations for Other Services   NA    Precautions / Restrictions Precautions Precautions: Fall;Other (comment) Precaution Comments: monitor HR and BP      Mobility  Bed Mobility Overal bed mobility: Modified Independent             General bed mobility comments: HOB elevated needed to use rail to help pull trunk up to sitting.   Transfers Overall transfer level: Needs assistance   Transfers: Sit to/from Stand Sit to Stand: Min assist         General transfer comment: Min assist to help pt power up over weak legs.   Ambulation/Gait Ambulation/Gait assistance: Min assist;Mod assist Gait Distance (Feet): 10 Feet Assistive device: Rolling walker (2 wheeled) Gait Pattern/deviations: Step-through pattern;Antalgic     General Gait Details: Pt favoring left leg during gait, letting go of walker at times, needing up to mod assist as we circled the end of the bed to the other side for safety and balance (especially when  she would let go of the RW).          Balance Overall balance assessment: Needs assistance Sitting-balance support: Feet supported;No upper extremity supported Sitting balance-Caitlyn Hardy Scale: Good     Standing balance support: Bilateral upper extremity supported Standing balance-Caitlyn Hardy Scale: Poor Standing balance comment: needs external support and external assist in standing.                              Pertinent Vitals/Pain Pain Assessment: No/denies pain    Home Living Family/patient expects to be discharged to:: Private residence Living Arrangements: Alone Available Help at Discharge: Family;Available PRN/intermittently Type of Home: House Home Access: Stairs to enter     Home Layout: One level Home Equipment: Environmental consultant - 2 wheels;Other (comment)(home O2 3L)      Prior Function Level of Independence: Independent with assistive device(s)         Comments: per pt walks with walker and uses home O2.      Hand Dominance   Dominant Hand: Right    Extremity/Trunk Assessment   Upper Extremity Assessment Upper Extremity Assessment: Defer to OT evaluation    Lower Extremity Assessment Lower Extremity Assessment: Generalized weakness(L knee with bad OA per pt report, it gives away)    Cervical / Trunk Assessment Cervical / Trunk Assessment: Normal  Communication   Communication: No difficulties  Cognition Arousal/Alertness: Awake/alert Behavior During Therapy: Restless Overall Cognitive Status: Impaired/Different from baseline Area of Impairment: Orientation;Attention;Following commands;Memory;Safety/judgement;Awareness;Problem  solving                 Orientation Level: Disoriented to;Situation Current Attention Level: Sustained Memory: Decreased short-term memory Following Commands: Follows one step commands consistently Safety/Judgement: Decreased awareness of safety;Decreased awareness of deficits Awareness: Intellectual Problem Solving:  Requires verbal cues General Comments: Pt is a bit tangental and confabulatory despite being basically oriented.  She is very restless in the bed, picking at her lines with no awareness that she should not do this despite repeated cues.  She also frequently lets go of the RW during gait to reach for things or straigten things and is close to falling over.       General Comments General comments (skin integrity, edema, etc.): Took orthostatic vitals and her BP rose slightly as she stood.  She could not stand long enough to get a 3 mins standing BP.  HR 110s, O2 stable on 3 L O2 [place]        Assessment/Plan    PT Assessment Patient needs continued PT services  PT Problem List Decreased strength;Decreased activity tolerance;Decreased balance;Decreased mobility;Decreased cognition;Decreased knowledge of use of DME;Decreased safety awareness;Decreased knowledge of precautions;Cardiopulmonary status limiting activity;Obesity       PT Treatment Interventions DME instruction;Gait training;Stair training;Functional mobility training;Therapeutic activities;Therapeutic exercise;Balance training;Patient/family education;Cognitive remediation    PT Goals (Current goals can be found in the Care Plan section)  Acute Rehab PT Goals Patient Stated Goal: to go home PT Goal Formulation: With patient Time For Goal Achievement: 03/11/18 Potential to Achieve Goals: Good    Frequency Min 2X/week   Barriers to discharge Decreased caregiver support         AM-PAC PT "6 Clicks" Mobility  Outcome Measure Help needed turning from your back to your side while in a flat bed without using bedrails?: None Help needed moving from lying on your back to sitting on the side of a flat bed without using bedrails?: None Help needed moving to and from a bed to a chair (including a wheelchair)?: A Little Help needed standing up from a chair using your arms (e.g., wheelchair or bedside chair)?: A Little Help needed to  walk in hospital room?: A Lot Help needed climbing 3-5 steps with a railing? : A Lot 6 Click Score: 18    End of Session Equipment Utilized During Treatment: Gait belt;Oxygen(3 L O2 [place]) Activity Tolerance: Patient tolerated treatment well Patient left: in bed;with call bell/phone within reach;with bed alarm set;with nursing/sitter in room(tele sitter in room) Nurse Communication: Mobility status(to RN tech) PT Visit Diagnosis: Muscle weakness (generalized) (M62.81);Difficulty in walking, not elsewhere classified (R26.2)    Time: 8546-2703 PT Time Calculation (min) (ACUTE ONLY): 24 min   Charges:             Wells Guiles B. Ginna Schuur, PT, DPT  Acute Rehabilitation [phone] pager #[phone] office   PT Evaluation $PT Eval Moderate Complexity: 1 Mod PT Treatments $Therapeutic Activity: 8-22 mins         02/25/2018, 8:19 AM

## 2018-02-25 NOTE — Progress Notes (Signed)
Progress Note  Patient Name: Caitlyn Hardy Date of Encounter: 02/25/2018  Primary Cardiologist: Dr. Kate Sable  Subjective   No chest pain or breathlessness at rest.  No palpitations.  Inpatient Medications    Scheduled Meds: . amiodarone  200 mg Oral Daily  . apixaban  5 mg Oral BID  . carvedilol  3.125 mg Oral BID WC  . Chlorhexidine Gluconate Cloth  6 each Topical q morning - 10a  . insulin aspart  0-5 Units Subcutaneous QHS  . insulin aspart  0-9 Units Subcutaneous TID WC  . mouth rinse  15 mL Mouth Rinse BID  . mupirocin ointment  1 application Nasal BID    PRN Meds: acetaminophen, ipratropium, ondansetron (ZOFRAN) IV, sodium chloride flush, temazepam   Vital Signs    Vitals:   02/25/18 0539 02/25/18 0600 02/25/18 0621 02/25/18 0632  BP:  (!) 117/95    Pulse: (!) 124 (!) 121 (!) 119 (!) 122  Resp: (!) 32 (!) 37 (!) 32 (!) 30  Temp:      TempSrc:      SpO2: (!) 88% 94% (!) 88% 94%  Weight:      Height:        Intake/Output Summary (Last 24 hours) at 02/25/2018 0835 Last data filed at 02/25/2018 0200 Gross per 24 hour  Intake 250 ml  Output 860 ml  Net -610 ml   Filed Weights   02/23/18 1923 02/25/18 0238  Weight: 129.4 kg 128.7 kg    Telemetry    Atrial fibrillation with RVR.  Personally reviewed.  Physical Exam   GEN:  Obese woman, no acute distress.   Neck: No JVD. Cardiac:  Irregularly irregular, no gallop.  Respiratory: Nonlabored.  Diminished breath sounds. GI:  Obese, nontender, bowel sounds present. MS: No edema; No deformity. Neuro:  Nonfocal. Psych: Alert and oriented x 2. Calm.  Labs    Chemistry Recent Labs  Lab 02/23/18 2228 02/24/18 0324  NA 141 141  K 3.7 3.4*  CL 99 98  CO2 33* 35*  GLUCOSE 95 108*  BUN 24* 22  CREATININE 1.61* 1.58*  CALCIUM 9.0 8.7*  PROT 6.3*  --   ALBUMIN 3.7  --   AST 19  --   ALT 12  --   ALKPHOS 40  --   BILITOT 0.8  --   GFRNONAA 32* 32*  GFRAA 37* 37*  ANIONGAP 9 8      Hematology Recent Labs  Lab 02/23/18 2228 02/24/18 0324  WBC 4.6 3.8*  RBC 3.86* 3.41*  HGB 10.2* 9.2*  HCT 35.6* 31.7*  MCV 92.2 93.0  MCH 26.4 27.0  MCHC 28.7* 29.0*  RDW 15.9* 15.9*  PLT 126* 106*    Radiology    Dg Chest Port 1 View  Result Date: 02/24/2018 CLINICAL DATA:  PICC line placement EXAM: PORTABLE CHEST 1 VIEW COMPARISON:  02/23/2018 FINDINGS: Right-sided PICC line tip projects over the lower SVC. There are median sternotomy wires and sequelae of CABG. Unchanged cardiomegaly. Slightly increased central pulmonary vascular congestion and multifocal atelectasis of the right lung. IMPRESSION: Right-sided PICC line tip projects over the lower SVC. Electronically Signed   By: Ulyses Jarred M.D.   On: 02/24/2018 03:13   Dg Chest Port 1 View  Result Date: 02/23/2018 CLINICAL DATA:  Hypoxia EXAM: PORTABLE CHEST 1 VIEW COMPARISON:  04/03/2015 FINDINGS: Post sternotomy changes. Cardiomegaly with central vascular congestion. No pleural effusion. No pneumothorax. Tubing or lead over the right upper chest, if this  is a vascular catheter takes a slightly unusual curved course distally. IMPRESSION: 1. Cardiomegaly with vascular congestion. 2. Catheter tubing or external lead over the right chest for which clinical correlation is recommended Electronically Signed   By: Donavan Foil M.D.   On: 02/23/2018 23:56   [place] Ekg Site Rite  Result Date: 02/24/2018 If Site Rite image not attached, placement could not be confirmed due to current cardiac rhythm.   Cardiac Studies   Echocardiogram 01/25/2018: Study Conclusions  - Left ventricle: The cavity size was mildly to moderately dilated.   Systolic function was severely reduced. The estimated ejection   fraction was in the range of 25% to 30%. The study is not   technically sufficient to allow evaluation of LV diastolic   function. - Regional wall motion abnormality: Akinesis of the mid-apical   anterior, mid anteroseptal,  apical inferior, and apical   myocardium. - Aortic valve: Mildly to moderately calcified annulus. Trileaflet;   moderately thickened leaflets. Valve area (VTI): 1.88 cm^2. Valve   area (Vmax): 1.92 cm^2. - Mitral valve: Mildly calcified annulus. Mildly thickened leaflets   . There was mild regurgitation. - Left atrium: The atrium was moderately dilated. - Right ventricle: Systolic function was mildly reduced. - Right atrium: The atrium was mildly dilated. - Atrial septum: No defect or patent foramen ovale was identified. - Pulmonary arteries: Systolic pressure was severely increased. PA   peak pressure: 71 mm Hg (S). - Technically difficult study. Echocontrast was used to enhance   visualization. - Sluggish flow in the LV apex with mild swirling densities without   clear organized thrombus.  Patient Profile     72 y.o. female with CAD s/p CABG in 2001 with subsequent PCI of LCx in 7902, chronic systolic CHF (EF 40-97% on last echo 12/2017), persistent atrial fibrillation, DM type 2, COPD on home O2 2-3L, OSA on CPAP, CVA, CKD stage 3, and morbid obesity, who is being seen for the evaluation of atrial fibrillation with RVR.  Assessment & Plan    1.  Persistent atrial fibrillation with RVR. Currently on Amiodarone which was recently restarted, low dose Coreg which was changed from Lopressor, and Eliquis for stroke prophylaxis. Heart rate is not well controlled as yet.  2. Ischemic cardiomyopathy with LVEF 25-30% by recent echocardiogram.  3.  Chronic combined heart failure.  4.  CAD status post CABG and subsequent PCI to the circumflex in 2011.  No active chest pain at this time.  5.  COPD with chronic hypoxic respiratory failure.  Increase Coreg to 6.25 mg twice daily, also increase amiodarone to 200 mg twice daily for now.  Depending on blood pressure response, may be somewhat limited in further up titration of Coreg, if this is the case would change to Toprol-XL.  Continue Eliquis  for stroke prophylaxis.  Hold off on resumption of ARB or Entresto to allow blood pressure room for up titration of heart rate control medications.  Signed, Rozann Lesches, MD  02/25/2018, 8:35 AM

## 2018-02-25 NOTE — Progress Notes (Signed)
PROGRESS NOTE        PATIENT DETAILS Name: Caitlyn Hardy Age: 72 y.o. Sex: female Date of Birth: [birth year] Admit Date: 02/23/2018 Admitting Physician Elwyn Reach, MD KGM:[id number], Lupita Dawn, MD  Brief Narrative: Patient is a 72 y.o. female with history of chronic systolic heart failure, COPD on 3-4 L of oxygen at home, CAD status post CABG, PAF, probable dementia at baseline-transferred from [facility] for evaluation of altered mental status, orthostatic hypotension and A. fib with RVR.  See below for further details  Subjective: Pleasantly confused-but able to follow all my commands-answers most of my questions appropriately.  No family at bedside.  Assessment/Plan: Altered mental status-likely delirium: Per prior notes-patient has a history of dementia with short-term memory issues-continue supportive care-minimize narcotics/benzodiazepines, keep room well lit during the daytime.  Currently pleasantly confused and easily redirectable.  Asymptomatic bacteriuria: No indication for IV antibiotics-follow.  Orthostatic hypotension: Resolved.  A. fib with RVR: Heart rate still in the low 100s range-Coreg has been increased to 6.25 mg twice daily, amiodarone has been increased to 200 mg twice daily.  Cardiology following.  On Eliquis.  Continue telemetry monitoring.  Chronic systolic heart failure: No evidence of volume overload at this point-continue oral diuretics.  COPD with chronic hypoxic respiratory failure: Stable without any exacerbation-continue bronchodilators.  Hypothyroidism: Continue Synthroid  Hypertension: Controlled-continue metoprolol  DM-2: CBG stable with SSI.  Depression: Continue Cymbalta/Zyprexa and Requip.  OSA: CPAP nightly  DVT Prophylaxis: Full dose anticoagulation with Eliquis  Code Status: Full code   Family Communication: None at bedside  Disposition Plan: Remain inpatient-SNF on discharge per physical  therapy.  Antimicrobial agents: Anti-infectives (From admission, onward)   Start     Dose/Rate Route Frequency Ordered Stop   02/24/18 0200  aztreonam (AZACTAM) 1 g in sodium chloride 0.9 % 100 mL IVPB  Status:  Discontinued     1 g 200 mL/hr over 30 Minutes Intravenous Every 8 hours 02/24/18 0100 02/24/18 1418      Procedures: None  CONSULTS:  cardiology  Time spent: 25 minutes-Greater than 50% of this time was spent in counseling, explanation of diagnosis, planning of further management, and coordination of care.  MEDICATIONS: Scheduled Meds: . amiodarone  200 mg Oral BID  . apixaban  5 mg Oral BID  . carvedilol  6.25 mg Oral BID WC  . Chlorhexidine Gluconate Cloth  6 each Topical q morning - 10a  . insulin aspart  0-5 Units Subcutaneous QHS  . insulin aspart  0-9 Units Subcutaneous TID WC  . mouth rinse  15 mL Mouth Rinse BID  . mupirocin ointment  1 application Nasal BID   Continuous Infusions: PRN Meds:.acetaminophen, ipratropium, ondansetron (ZOFRAN) IV, sodium chloride flush, temazepam   PHYSICAL EXAM: Vital signs: Vitals:   02/25/18 0539 02/25/18 0600 02/25/18 0621 02/25/18 0632  BP:  (!) 117/95    Pulse: (!) 124 (!) 121 (!) 119 (!) 122  Resp: (!) 32 (!) 37 (!) 32 (!) 30  Temp:      TempSrc:      SpO2: (!) 88% 94% (!) 88% 94%  Weight:      Height:       Filed Weights   02/23/18 1923 02/25/18 0238  Weight: 129.4 kg 128.7 kg   Body mass index is 45.11 kg/m.   General appearance :Awake, alert, not in any distress.  Speech Clear.  Eyes:, pupils equally reactive to light and accomodation,no scleral icterus. HEENT: Atraumatic and Normocephalic Neck: supple Resp:Good air entry bilaterally, no added sounds  CVS: S1 S2 regular, no murmurs.  GI: Bowel sounds present, Non tender and not distended with no gaurding, rigidity or rebound.No organomegaly Extremities: B/L Lower Ext shows no edema, both legs are warm to touch Neurology:  speech clear,Non focal,  sensation is grossly intact. Musculoskeletal:No digital cyanosis Skin:No Rash, warm and dry Wounds:N/A  I have personally reviewed following labs and imaging studies  LABORATORY DATA: CBC: Recent Labs  Lab 02/23/18 2228 02/24/18 0324  WBC 4.6 3.8*  NEUTROABS 2.6  --   HGB 10.2* 9.2*  HCT 35.6* 31.7*  MCV 92.2 93.0  PLT 126* 106*    Basic Metabolic Panel: Recent Labs  Lab 02/23/18 2228 02/24/18 0324  NA 141 141  K 3.7 3.4*  CL 99 98  CO2 33* 35*  GLUCOSE 95 108*  BUN 24* 22  CREATININE 1.61* 1.58*  CALCIUM 9.0 8.7*    GFR: Estimated Creatinine Clearance: 44.6 mL/min (A) (by C-G formula based on SCr of 1.58 mg/dL (H)).  Liver Function Tests: Recent Labs  Lab 02/23/18 2228  AST 19  ALT 12  ALKPHOS 40  BILITOT 0.8  PROT 6.3*  ALBUMIN 3.7   No results for input(s): LIPASE, AMYLASE in the last 168 hours. No results for input(s): AMMONIA in the last 168 hours.  Coagulation Profile: No results for input(s): INR, PROTIME in the last 168 hours.  Cardiac Enzymes: No results for input(s): CKTOTAL, CKMB, CKMBINDEX, TROPONINI in the last 168 hours.  BNP (last 3 results) No results for input(s): PROBNP in the last 8760 hours.  HbA1C: No results for input(s): HGBA1C in the last 72 hours.  CBG: Recent Labs  Lab 02/24/18 0608 02/24/18 1126 02/24/18 1645 02/24/18 2039 02/25/18 0615  GLUCAP 97 101* 86 99 136*    Lipid Profile: Recent Labs    02/24/18 0325  CHOL 99  HDL 29*  LDLCALC 49  TRIG 104  CHOLHDL 3.4    Thyroid Function Tests: Recent Labs    02/24/18 0324  TSH 5.989*    Anemia Panel: No results for input(s): VITAMINB12, FOLATE, FERRITIN, TIBC, IRON, RETICCTPCT in the last 72 hours.  Urine analysis:    Component Value Date/Time   COLORURINE YELLOW 02/24/2018 0003   APPEARANCEUR CLEAR 02/24/2018 0003   LABSPEC 1.006 02/24/2018 0003   PHURINE 5.0 02/24/2018 0003   GLUCOSEU NEGATIVE 02/24/2018 0003   HGBUR SMALL (A) 02/24/2018  0003   BILIRUBINUR NEGATIVE 02/24/2018 0003   KETONESUR NEGATIVE 02/24/2018 0003   PROTEINUR NEGATIVE 02/24/2018 0003   UROBILINOGEN 1.0 03/26/2012 0100   NITRITE NEGATIVE 02/24/2018 0003   LEUKOCYTESUR LARGE (A) 02/24/2018 0003    Sepsis Labs: Lactic Acid, Venous    Component Value Date/Time   LATICACIDVEN 1.9 04/03/2012 1839    MICROBIOLOGY: Recent Results (from the past 240 hour(s))  MRSA PCR Screening     Status: Abnormal   Collection Time: 02/23/18  9:16 PM  Result Value Ref Range Status   MRSA by PCR POSITIVE (A) NEGATIVE Final    Comment:        The GeneXpert MRSA Assay (FDA approved for NASAL specimens only), is one component of a comprehensive MRSA colonization surveillance program. It is not intended to diagnose MRSA infection nor to guide or monitor treatment for MRSA infections. RESULT CALLED TO, READ BACK BY AND VERIFIED WITHGearldine Shown RN [phone] 02/24/18  A BROWNING Performed at [facility], [place] [address], [place], [place] [address]     RADIOLOGY STUDIES/RESULTS: Dg Chest Port 1 View  Result Date: 02/24/2018 CLINICAL DATA:  PICC line placement EXAM: PORTABLE CHEST 1 VIEW COMPARISON:  02/23/2018 FINDINGS: Right-sided PICC line tip projects over the lower SVC. There are median sternotomy wires and sequelae of CABG. Unchanged cardiomegaly. Slightly increased central pulmonary vascular congestion and multifocal atelectasis of the right lung. IMPRESSION: Right-sided PICC line tip projects over the lower SVC. Electronically Signed   By: Ulyses Jarred M.D.   On: 02/24/2018 03:13   Dg Chest Port 1 View  Result Date: 02/23/2018 CLINICAL DATA:  Hypoxia EXAM: PORTABLE CHEST 1 VIEW COMPARISON:  04/03/2015 FINDINGS: Post sternotomy changes. Cardiomegaly with central vascular congestion. No pleural effusion. No pneumothorax. Tubing or lead over the right upper chest, if this is a vascular catheter takes a slightly unusual curved course distally. IMPRESSION: 1.  Cardiomegaly with vascular congestion. 2. Catheter tubing or external lead over the right chest for which clinical correlation is recommended Electronically Signed   By: Donavan Foil M.D.   On: 02/23/2018 23:56   [place] Ekg Site Rite  Result Date: 02/24/2018 If Site Rite image not attached, placement could not be confirmed due to current cardiac rhythm.    LOS: 2 days   Oren Binet, MD  [facility]  If 7PM-7AM, please contact night-coverage  Please page via [email or web address]-Password [email or web address]-click on MD name and type text message  02/25/2018, 9:53 AM

## 2018-02-25 NOTE — Progress Notes (Signed)
Placed pt on home CPAP with home mask and home settings, 5 L bled in and educated NT about O2 with and without it on and to call if needed assistance. RT to cont to monitor.

## 2018-02-25 NOTE — Progress Notes (Signed)
Pt had Atrial fib with RVR on mornitor. HR 125-155, BP stable around 147/79 mmHg. Notified Dr. Hilbert Bible and Dr, Lucinda Dell, order received for Metoprolol 5 mg IV stat once given. Pt responded well, HR 93-110 ,back to under controlled after one dose of Metoprolol. Will continue to monitor.  Kennyth Lose, RN

## 2018-02-26 LAB — GLUCOSE, CAPILLARY
Glucose-Capillary: 115 mg/dL — ABNORMAL HIGH (ref 70–99)
Glucose-Capillary: 92 mg/dL (ref 70–99)
Glucose-Capillary: 86 mg/dL (ref 70–99)
Glucose-Capillary: 110 mg/dL — ABNORMAL HIGH (ref 70–99)

## 2018-02-26 LAB — BASIC METABOLIC PANEL
ANION GAP: 5 (ref 5–15)
BUN: 24 mg/dL — AB (ref 8–23)
CALCIUM: 8.9 mg/dL (ref 8.9–10.3)
CHLORIDE: 101 mmol/L (ref 98–111)
CO2: 33 mmol/L — AB (ref 22–32)
CREATININE: 1.46 mg/dL — AB (ref 0.44–1.00)
GFR calc Af Amer: 41 mL/min — ABNORMAL LOW (ref >60)
GFR calc non Af Amer: 36 mL/min — ABNORMAL LOW (ref >60)
Glucose, Bld: 114 mg/dL — ABNORMAL HIGH (ref 70–99)
Potassium: 3.9 mmol/L (ref 3.5–5.1)
Sodium: 139 mmol/L (ref 135–145)

## 2018-02-26 LAB — CBC
HCT: 31.4 % — ABNORMAL LOW (ref 36.0–46.0)
HEMOGLOBIN: 9 g/dL — AB (ref 12.0–15.0)
MCH: 27 pg (ref 26.0–34.0)
MCHC: 28.7 g/dL — ABNORMAL LOW (ref 30.0–36.0)
MCV: 94.3 fL (ref 80.0–100.0)
NRBC: 0 % (ref 0.0–0.2)
PLATELETS: 109 10*3/uL — AB (ref 150–400)
RBC: 3.33 MIL/uL — AB (ref 3.87–5.11)
RDW: 16.3 % — ABNORMAL HIGH (ref 11.5–15.5)
WBC: 4.4 10*3/uL (ref 4.0–10.5)

## 2018-02-26 MED ORDER — TORSEMIDE 20 MG PO TABS
20.0000 mg | ORAL_TABLET | Freq: Every day | ORAL | Status: DC
  Administered 2018-02-26 – 2018-03-05 (×8): 20 mg via ORAL
  Filled 2018-02-26 (×8): qty 1

## 2018-02-26 MED ORDER — DIGOXIN 125 MCG PO TABS
0.0625 mg | ORAL_TABLET | Freq: Every day | ORAL | Status: DC
  Administered 2018-02-26 – 2018-03-05 (×8): 0.0625 mg via ORAL
  Filled 2018-02-26 (×8): qty 1

## 2018-02-26 MED ORDER — METOPROLOL TARTRATE 5 MG/5ML IV SOLN
5.0000 mg | Freq: Once | INTRAVENOUS | Status: AC
  Administered 2018-02-26: 5 mg via INTRAVENOUS
  Filled 2018-02-26: qty 5

## 2018-02-26 NOTE — Evaluation (Addendum)
Occupational Therapy Evaluation Patient Details Name: Caitlyn Hardy MRN: [id number] DOB: [birth year] Today's Date: 02/26/2018    History of Present Illness 72 y.o. female admitted on 02/23/18 for orthostatic hypotension, A-fib, and likely UTI.  Pt with significant PMH of thrombocytopenia, stroke, pulmonary HTN, PAF, MI, morbid obesity, HTN, DM, COPD, chronic systolic CHF, CAD, anemia, R shoulder surgery, R TKA, and CABG.     Clinical Impression   This 72 yo female admitted with above presents to acute OT with PLOF of being independent with basic ADLs and now is setup/S-Mod A for basic ADLs. She will benefit from acute OT with follow up OT at SNF to work back towards PLOF.  Supine BP 98/56  Sitting BP 104/80 (reported slight dizziness) Standing BP did not register Standing BP 3 minutes 85/68 (pt not reporting dizzines)    Follow Up Recommendations  SNF;Supervision/Assistance - 24 hour    Equipment Recommendations  Other (comment)(TBD at next venue)       Precautions / Restrictions Precautions Precautions: Fall Precaution Comments: monitor HR and BP Restrictions Weight Bearing Restrictions: No      Mobility Bed Mobility Overal bed mobility: Needs Assistance Bed Mobility: Supine to Sit       Sit to supine: HOB elevated;Supervision(use of rail)      Transfers Overall transfer level: Needs assistance Equipment used: Rolling walker (2 wheeled) Transfers: Sit to/from Stand Sit to Stand: Min assist;+2 physical assistance              Balance Overall balance assessment: Needs assistance Sitting-balance support: No upper extremity supported;Feet supported Sitting balance-Leahy Scale: Good     Standing balance support: Bilateral upper extremity supported Standing balance-Leahy Scale: Poor Standing balance comment: needs external support and external assist in standing.                            ADL either performed or assessed with clinical judgement    ADL Overall ADL's : Needs assistance/impaired Eating/Feeding: Independent;Sitting   Grooming: Set up;Sitting;Supervision/safety   Upper Body Bathing: Supervision/ safety;Set up;Sitting   Lower Body Bathing: Moderate assistance Lower Body Bathing Details (indicate cue type and reason): min A +2 sit stand safety Upper Body Dressing : Set up;Supervision/safety;Sitting   Lower Body Dressing: Moderate assistance Lower Body Dressing Details (indicate cue type and reason): min A +2 sit stand safety Toilet Transfer: Minimal assistance;+2 for safety/equipment;RW;Stand-pivot;BSC   Toileting- Clothing Manipulation and Hygiene: Maximal assistance Toileting - Clothing Manipulation Details (indicate cue type and reason): min A +2 sit stand safety             Vision Patient Visual Report: No change from baseline              Pertinent Vitals/Pain Pain Assessment: Faces Faces Pain Scale: Hurts a little bit Pain Location: right shoulder blade and  low back Pain Descriptors / Indicators: Sore Pain Intervention(s): Repositioned;Monitored during session     Hand Dominance Right   Extremity/Trunk Assessment Upper Extremity Assessment Upper Extremity Assessment: Overall WFL for tasks assessed           Communication Communication Communication: No difficulties   ognition Arousal/Alertness: Awake/alert Behavior During Therapy: WFL for tasks assessed/performed Overall Cognitive Status: Impaired/Different from baseline Area of Impairment: Following commands;Safety/judgement;Problem solving                       Following Commands: Follows one step commands consistently Safety/Judgement: Decreased awareness of safety;Decreased  awareness of deficits   Problem Solving: Requires verbal cues;Requires tactile cues General Comments: Pt is a bit tangental and confabulatory despite being basically oriented. Not restless in bed today, sone was present.  Using walker safely  today.              Home Living Family/patient expects to be discharged to:: Skilled nursing facility Living Arrangements: Alone Available Help at Discharge: Family;Available PRN/intermittently Type of Home: House Home Access: Stairs to enter     Home Layout: One level     Bathroom Shower/Tub: Teacher, early years/pre: Handicapped height Bathroom Accessibility: Yes   Home Equipment: Environmental consultant - 2 wheels;Other (comment)(O2 3 liters)          Prior Functioning/Environment Level of Independence: Independent with assistive device(s)        Comments: per pt walks with walker and uses home O2 and does her own basic ADLs and some IADLs        OT Problem List: Impaired balance (sitting and/or standing);Obesity;Decreased safety awareness      OT Treatment/Interventions: Self-care/ADL training;Balance training;DME and/or AE instruction;Patient/family education    OT Goals(Current goals can be found in the care plan section) Acute Rehab OT Goals Patient Stated Goal: to go home OT Goal Formulation: With patient Time For Goal Achievement: 03/12/18 Potential to Achieve Goals: Good  OT Frequency: Min 2X/week   Barriers to D/C: Decreased caregiver support             AM-PAC OT "6 Clicks" Daily Activity     Outcome Measure Help from another person eating meals?: None Help from another person taking care of personal grooming?: A Little Help from another person toileting, which includes using toliet, bedpan, or urinal?: A Lot Help from another person bathing (including washing, rinsing, drying)?: A Lot Help from another person to put on and taking off regular upper body clothing?: A Little Help from another person to put on and taking off regular lower body clothing?: A Lot 6 Click Score: 16   End of Session Equipment Utilized During Treatment: Gait belt;Rolling walker Nurse Communication: (pt with alarm on, had bowel movement, RN OK'd for pt to have mitts off  (they were not on when I entered--they were laying in her bed))  Activity Tolerance: Patient tolerated treatment well Patient left: in chair;with call bell/phone within reach;with chair alarm set;with family/visitor present  OT Visit Diagnosis: Unsteadiness on feet (R26.81);Other abnormalities of gait and mobility (R26.89);Muscle weakness (generalized) (M62.81)                Time: 7782-4235 OT Time Calculation (min): 47 min Charges:  OT General Charges $OT Visit: 1 Visit OT Evaluation $OT Eval Moderate Complexity: 1 Mod OT Treatments $Self Care/Home Management : 23-37 mins  [address] Circle, OTR/L Acute [employer] Pager [phone] Office [phone]     Almon Register 02/26/2018, 11:04 AM

## 2018-02-26 NOTE — Progress Notes (Signed)
Progress Note  Patient Name: Caitlyn Hardy Date of Encounter: 02/26/2018  Primary Cardiologist:   Kate Sable, MD   Subjective   She has had continued delirium.  She has had desaturations and difficulty orienting to CPAP.  She is awake and without acute complaints now but she is somewhat confused.   Inpatient Medications    Scheduled Meds: . amiodarone  200 mg Oral BID  . apixaban  5 mg Oral BID  . arformoterol  15 mcg Nebulization BID  . budesonide (PULMICORT) nebulizer solution  0.25 mg Nebulization BID  . carvedilol  6.25 mg Oral BID WC  . Chlorhexidine Gluconate Cloth  6 each Topical q morning - 10a  . DULoxetine  60 mg Oral QHS  . gabapentin  100 mg Oral TID  . insulin aspart  0-5 Units Subcutaneous QHS  . insulin aspart  0-9 Units Subcutaneous TID WC  . levothyroxine  25 mcg Oral Q0600  . mouth rinse  15 mL Mouth Rinse BID  . mupirocin ointment  1 application Nasal BID  . rOPINIRole  1 mg Oral QHS  . torsemide  20 mg Oral Daily   Continuous Infusions:  PRN Meds: acetaminophen, ipratropium, levalbuterol, ondansetron (ZOFRAN) IV, sodium chloride flush, temazepam   Vital Signs    Vitals:   02/25/18 2233 02/26/18 0330 02/26/18 0733 02/26/18 0839  BP:  (!) 117/91    Pulse:  (!) 124  (!) 142  Resp: 20 (!) 33  (!) 22  Temp:  98.2 F (36.8 C)    TempSrc:  Axillary    SpO2:  99%  97%  Weight:   124.6 kg   Height:        Intake/Output Summary (Last 24 hours) at 02/26/2018 1047 Last data filed at 02/25/2018 2200 Gross per 24 hour  Intake 830 ml  Output -  Net 830 ml   Filed Weights   02/23/18 1923 02/25/18 0238 02/26/18 0733  Weight: 129.4 kg 128.7 kg 124.6 kg    Telemetry    Atrial fib with rapid rate intermittent - Personally Reviewed  ECG    NA - Personally Reviewed  Physical Exam   GEN: No acute distress.   Neck: No  JVD Cardiac: Irregular RR, no murmurs, rubs, or gallops.  Respiratory: Clear to auscultation bilaterally. GI: Soft,  nontender, non-distended  MS: No edema; No deformity. Neuro:  Nonfocal  Psych: Normal affect   Labs    Chemistry Recent Labs  Lab 02/23/18 2228 02/24/18 0324 02/26/18 0342  NA 141 141 139  K 3.7 3.4* 3.9  CL 99 98 101  CO2 33* 35* 33*  GLUCOSE 95 108* 114*  BUN 24* 22 24*  CREATININE 1.61* 1.58* 1.46*  CALCIUM 9.0 8.7* 8.9  PROT 6.3*  --   --   ALBUMIN 3.7  --   --   AST 19  --   --   ALT 12  --   --   ALKPHOS 40  --   --   BILITOT 0.8  --   --   GFRNONAA 32* 32* 36*  GFRAA 37* 37* 41*  ANIONGAP 9 8 5      Hematology Recent Labs  Lab 02/23/18 2228 02/24/18 0324 02/26/18 0342  WBC 4.6 3.8* 4.4  RBC 3.86* 3.41* 3.33*  HGB 10.2* 9.2* 9.0*  HCT 35.6* 31.7* 31.4*  MCV 92.2 93.0 94.3  MCH 26.4 27.0 27.0  MCHC 28.7* 29.0* 28.7*  RDW 15.9* 15.9* 16.3*  PLT 126* 106* 109*  Cardiac EnzymesNo results for input(s): TROPONINI in the last 168 hours. No results for input(s): TROPIPOC in the last 168 hours.   BNPNo results for input(s): BNP, PROBNP in the last 168 hours.   DDimer No results for input(s): DDIMER in the last 168 hours.   Radiology    No results found.  Cardiac Studies   Echocardiogram 01/25/2018: Study Conclusions  - Left ventricle: The cavity size was mildly to moderately dilated. Systolic function was severely reduced. The estimated ejection fraction was in the range of 25% to 30%. The study is not technically sufficient to allow evaluation of LV diastolic function. - Regional wall motion abnormality: Akinesis of the mid-apical anterior, mid anteroseptal, apical inferior, and apical myocardium. - Aortic valve: Mildly to moderately calcified annulus. Trileaflet; moderately thickened leaflets. Valve area (VTI): 1.88 cm^2. Valve area (Vmax): 1.92 cm^2. - Mitral valve: Mildly calcified annulus. Mildly thickened leaflets . There was mild regurgitation. - Left atrium: The atrium was moderately dilated. - Right ventricle:  Systolic function was mildly reduced. - Right atrium: The atrium was mildly dilated. - Atrial septum: No defect or patent foramen ovale was identified. - Pulmonary arteries: Systolic pressure was severely increased. PA peak pressure: 71 mm Hg (S). - Technically difficult study. Echocontrast was used to enhance visualization. - Sluggish flow in the LV apex with mild swirling densities without clear organized thrombus.   Patient Profile     72 y.o. female with CAD s/p CABG in 2001 with subsequent PCI of LCx in 4481, chronic systolic CHF (EF 85-63% on last echo 12/2017), persistent atrial fibrillation, DM type 2, COPD on home O2 2-3L, OSA on CPAP, CVA, CKD stage 3,andmorbid obesity,who is being seen for the evaluation ofatrial fibrillation with RVR.  Assessment & Plan    ORTHOSTATIC HYPOTENSION:      She has low BP but no overt orthostasis.  No change in therapy.  This will make further med titration difficult.    ATRIAL FIB WITH RVR: Coreg and amiodarone were increased yesterday.   Continue current therapy.  I will add a low dose digoxin for now.  I will follow with levels if I continue this given the drug drug interactions with amio and decreased GFR.   CHRONIC SYSTOLIC HF:   She seems to be euvolemic.    HTN:  BP is low as above.   For questions or updates, please contact [place] Please consult [email or web address] for contact info under Cardiology/STEMI.   Signed, Minus Breeding, MD  02/26/2018, 10:47 AM

## 2018-02-26 NOTE — NC FL2 (Signed)
[place] LEVEL OF CARE SCREENING TOOL     IDENTIFICATION  Patient Name: Caitlyn Hardy Birthdate: 1945-11-22 Sex: female Admission Date (Current Location): 02/23/2018  [facility] and [place] Number:  Herbalist and Address:  [facility] [place], [place] [address], [place],  [address]      Provider Number: [id number]  Attending Physician Name and Address:  Jonetta Osgood, MD  Relative Name and Phone Number:  Norleen Xie; son; [phone]    Current Level of Care: Hospital Recommended Level of Care: [place] Prior Approval Number:    Date Approved/Denied:   PASRR Number:    Discharge Plan: SNF    Current Diagnoses: Patient Active Problem List   Diagnosis Date Noted  . A-fib ([place]) 02/23/2018  . UTI (urinary tract infection) 02/23/2018  . Metabolic encephalopathy 53/61/4431  . Orthostatic hypotension 02/23/2018  . Acute on chronic respiratory failure ([place]) 03/30/2015  . CHF exacerbation ([place]) 03/30/2015  . CKD (chronic kidney disease), stage III ([place]) 03/30/2015  . Left leg pain 09/13/2014  . Chronic anticoagulation 07/05/2014  . Indigestion 06/17/2013  . Bradycardia 05/31/2013  . H/O amiodarone therapy 02/14/2013  . Dizziness   . Sweating   . Arthritis   . Stroke ([place])   . Thrombus of left atrial appendage   . Morbid obesity ([place])   . On home oxygen therapy   . VT (ventricular tachycardia) ([place])   . Thrombocytopenia ([place])   . Restless leg   . CAD (coronary artery disease)   . Hypertension   . Paroxysmal atrial fibrillation (HCC)   . Depression   . COPD exacerbation ([place])   . Sleep apnea   . Diabetes mellitus ([place])   . Chronic kidney disease   . H/O hiatal hernia   . Headache(784.0)   . Ejection fraction   . Pulmonary hypertension ([place])   . Cardiomyopathy, ischemic   . Acute on chronic systolic heart failure ([place]) 06/02/2012  . Microcytic anemia 03/26/2012    Orientation RESPIRATION  BLADDER Height & Weight     Self, Situation, Place  O2(3-4L nasal canula; has home cpap) Continent Weight: 274 lb 11.1 oz (124.6 kg) Height:  5' 6.5" (168.9 cm)  BEHAVIORAL SYMPTOMS/MOOD NEUROLOGICAL BOWEL NUTRITION STATUS      Continent Diet(see discharge summary)  AMBULATORY STATUS COMMUNICATION OF NEEDS Skin   Limited Assist((min to mod assist)) Verbally Normal                       Personal Care Assistance Level of Assistance  Bathing, Feeding, Dressing Bathing Assistance: Limited assistance Feeding assistance: Independent Dressing Assistance: Limited assistance     Functional Limitations Info  Sight, Hearing, Speech Sight Info: Adequate Hearing Info: Adequate Speech Info: Adequate    SPECIAL CARE FACTORS FREQUENCY  PT (By licensed PT), OT (By licensed OT)     PT Frequency: 5x week OT Frequency: 5x week            Contractures Contractures Info: Not present    Additional Factors Info  Code Status, Allergies, Psychotropic, Insulin Sliding Scale, Isolation Precautions Code Status Info: Full Code Allergies Info: PENICILLINS, LYRICA PREGABALIN, ROCEPHIN CEFTRIAXONE SODIUM IN DEXTROSE, AZITHROMYCIN, BACTRIM SULFAMETHOXAZOLE-TRIMETHOPRIM, CATAPRES CLONIDINE HCL, CLONIDINE DERIVATIVES, SULFA ANTIBIOTICS  Psychotropic Info: DULoxetine (CYMBALTA) DR capsule 60 mg daily PO Insulin Sliding Scale Info: insulin aspart (novoLOG) injection 0-5 Units daily at bedtime; insulin aspart (novoLOG) injection 0-9 Units  3x daily with meals Isolation Precautions Info:  Contact Precautions MRSA     Current Medications (02/26/2018):  This is the current hospital active medication list Current Facility-Administered Medications  Medication Dose Route Frequency Provider Last Rate Last Dose  . acetaminophen (TYLENOL) tablet 650 mg  650 mg Oral Q4H PRN Elwyn Reach, MD   650 mg at 02/26/18 0951  . amiodarone (PACERONE) tablet 200 mg  200 mg Oral BID Satira Sark, MD   200 mg at  02/26/18 0947  . apixaban (ELIQUIS) tablet 5 mg  5 mg Oral BID Wendee Beavers T, MD   5 mg at 02/26/18 0947  . arformoterol (BROVANA) nebulizer solution 15 mcg  15 mcg Nebulization BID Jonetta Osgood, MD   15 mcg at 02/26/18 0839  . budesonide (PULMICORT) nebulizer solution 0.25 mg  0.25 mg Nebulization BID Jonetta Osgood, MD   0.25 mg at 02/26/18 0839  . carvedilol (COREG) tablet 6.25 mg  6.25 mg Oral BID WC Satira Sark, MD   6.25 mg at 02/26/18 0947  . Chlorhexidine Gluconate Cloth 2 % PADS 6 each  6 each Topical q morning - 10a Elwyn Reach, MD   6 each at 02/26/18 0947  . digoxin (LANOXIN) tablet 0.0625 mg  0.0625 mg Oral Daily Hochrein, Jeneen Rinks, MD      . DULoxetine (CYMBALTA) DR capsule 60 mg  60 mg Oral QHS Jonetta Osgood, MD   60 mg at 02/25/18 2133  . gabapentin (NEURONTIN) capsule 100 mg  100 mg Oral TID Jonetta Osgood, MD   100 mg at 02/26/18 0947  . insulin aspart (novoLOG) injection 0-5 Units  0-5 Units Subcutaneous QHS Garba, Mohammad L, MD      . insulin aspart (novoLOG) injection 0-9 Units  0-9 Units Subcutaneous TID WC Elwyn Reach, MD   1 Units at 02/25/18 [phone]  . ipratropium (ATROVENT) nebulizer solution 0.5 mg  0.5 mg Nebulization Q6H PRN Tommye Standard, Krista M., PA-C   0.5 mg at 02/25/18 1408  . levalbuterol (XOPENEX) nebulizer solution 0.63 mg  0.63 mg Nebulization Q6H PRN Ghimire, Henreitta Leber, MD      . levothyroxine (SYNTHROID, LEVOTHROID) tablet 25 mcg  25 mcg Oral Q0600 Jonetta Osgood, MD   25 mcg at 02/25/18 1221  . MEDLINE mouth rinse  15 mL Mouth Rinse BID Gala Romney L, MD   15 mL at 02/25/18 2140  . mupirocin ointment (BACTROBAN) 2 % 1 application  1 application Nasal BID Elwyn Reach, MD   1 application at 61/44/31 0948  . ondansetron (ZOFRAN) injection 4 mg  4 mg Intravenous Q6H PRN Elwyn Reach, MD   4 mg at 02/25/18 1512  . rOPINIRole (REQUIP) tablet 1 mg  1 mg Oral QHS Jonetta Osgood, MD   1 mg at 02/25/18 2133  . sodium  chloride flush (NS) 0.9 % injection 10-40 mL  10-40 mL Intracatheter PRN Jonelle Sidle, Mohammad L, MD      . temazepam (RESTORIL) capsule 7.5 mg  7.5 mg Oral QHS PRN Wendee Beavers T, MD   7.5 mg at 02/24/18 2301  . torsemide (DEMADEX) tablet 20 mg  20 mg Oral Daily Jonetta Osgood, MD   20 mg at 02/26/18 5400     Discharge Medications: Please see discharge summary for a list of discharge medications.  Relevant Imaging Results:  Relevant Lab Results:   Additional Information [id number] [place], [place]

## 2018-02-26 NOTE — Progress Notes (Signed)
PROGRESS NOTE        PATIENT DETAILS Name: Caitlyn Hardy Age: 72 y.o. Sex: female Date of Birth: Jul 06, 1945 Admit Date: 02/23/2018 Admitting Physician Elwyn Reach, MD TWS:[id number], Lupita Dawn, MD  Brief Narrative: Patient is a 72 y.o. female with history of chronic systolic heart failure, COPD on 3-4 L of oxygen at home, CAD status post CABG, PAF, probable dementia at baseline-transferred from [facility] for evaluation of altered mental status, orthostatic hypotension and A. fib with RVR.  See below for further details  Subjective: Wanted to sleep while I was in the room-but was answering all my questions appropriately.  He knows that she is at [facility] all 4 extremities when I asked her to.  Denies any shortness of breath or chest pain.  Assessment/Plan: Altered mental status-likely delirium: Per prior notes-patient has history of dementia and short-term memory issues-confusion seems to be waxing and waning-this morning she seemed okay thank you very minimally confused-easily redirectable.  Continue delirium precautions.  Asymptomatic bacteriuria: Asymptomatic-no indication for IV antibiotics.  Follow.    Orthostatic hypotension: Resolved.  A. fib with RVR: Heart rate still in the low 100s range-Per nursing staff-as high as 140s overnight.  Cardiology following-remains on Coreg and amiodarone-we will defer further adjustment to cardiology.  Continue telemetry monitoring.    Chronic systolic heart failure (EF 25-30%): No evidence of volume overload-continue Demadex.    COPD with chronic hypoxic respiratory failure: No evidence of exacerbation-continue bronchodilators.    Hypothyroidism: Continue Synthroid.  Hypertension: Controlled-continue metoprolol and oral diuretics.  DM-2: CBGs stable with SSI.  Depression: Continue Balter, Neurontin and Requip.  OSA: CPAP nightly  DVT Prophylaxis: Full dose anticoagulation with Eliquis  Code  Status: Full code   Family Communication: None at bedside-spoke with patient's son over the phone yesterday  Disposition Plan: Remain inpatient-SNF on discharge per physical therapy-probably needs a few more days for optimization before consideration of discharge  Antimicrobial agents: Anti-infectives (From admission, onward)   Start     Dose/Rate Route Frequency Ordered Stop   02/24/18 0200  aztreonam (AZACTAM) 1 g in sodium chloride 0.9 % 100 mL IVPB  Status:  Discontinued     1 g 200 mL/hr over 30 Minutes Intravenous Every 8 hours 02/24/18 0100 02/24/18 1418      Procedures: None  CONSULTS:  cardiology  Time spent: 25 minutes-Greater than 50% of this time was spent in counseling, explanation of diagnosis, planning of further management, and coordination of care.  MEDICATIONS: Scheduled Meds: . amiodarone  200 mg Oral BID  . apixaban  5 mg Oral BID  . arformoterol  15 mcg Nebulization BID  . budesonide (PULMICORT) nebulizer solution  0.25 mg Nebulization BID  . carvedilol  6.25 mg Oral BID WC  . Chlorhexidine Gluconate Cloth  6 each Topical q morning - 10a  . DULoxetine  60 mg Oral QHS  . gabapentin  100 mg Oral TID  . insulin aspart  0-5 Units Subcutaneous QHS  . insulin aspart  0-9 Units Subcutaneous TID WC  . levothyroxine  25 mcg Oral Q0600  . mouth rinse  15 mL Mouth Rinse BID  . mupirocin ointment  1 application Nasal BID  . rOPINIRole  1 mg Oral QHS  . torsemide  20 mg Oral Daily   Continuous Infusions: PRN Meds:.acetaminophen, ipratropium, levalbuterol, ondansetron (ZOFRAN) IV, sodium chloride  flush, temazepam   PHYSICAL EXAM: Vital signs: Vitals:   02/25/18 2233 02/26/18 0330 02/26/18 0733 02/26/18 0839  BP:  (!) 117/91    Pulse:  (!) 124  (!) 142  Resp: 20 (!) 33  (!) 22  Temp:  98.2 F (36.8 C)    TempSrc:  Axillary    SpO2:  99%  97%  Weight:   124.6 kg   Height:       Filed Weights   02/23/18 1923 02/25/18 0238 02/26/18 0733  Weight:  129.4 kg 128.7 kg 124.6 kg   Body mass index is 43.67 kg/m.   General appearance:Awake, alert, not in any distress.  Minimally confused but is easily redirectable and follows all commands. Eyes:no scleral icterus. HEENT: Atraumatic and Normocephalic Neck: supple, no JVD. Resp:Good air entry bilaterally, no added sounds anteriorly CVS: S1 S2 regular, no murmurs.  GI: Bowel sounds present, Non tender and not distended with no gaurding, rigidity or rebound. Extremities: B/L Lower Ext shows trace edema Neurology:  Non focal-but with generalized weakness. Musculoskeletal:No digital cyanosis Skin:No Rash, warm and dry Wounds:N/A  I have personally reviewed following labs and imaging studies  LABORATORY DATA: CBC: Recent Labs  Lab 02/23/18 2228 02/24/18 0324 02/26/18 0342  WBC 4.6 3.8* 4.4  NEUTROABS 2.6  --   --   HGB 10.2* 9.2* 9.0*  HCT 35.6* 31.7* 31.4*  MCV 92.2 93.0 94.3  PLT 126* 106* 109*    Basic Metabolic Panel: Recent Labs  Lab 02/23/18 2228 02/24/18 0324 02/26/18 0342  NA 141 141 139  K 3.7 3.4* 3.9  CL 99 98 101  CO2 33* 35* 33*  GLUCOSE 95 108* 114*  BUN 24* 22 24*  CREATININE 1.61* 1.58* 1.46*  CALCIUM 9.0 8.7* 8.9    GFR: Estimated Creatinine Clearance: 47.3 mL/min (A) (by C-G formula based on SCr of 1.46 mg/dL (H)).  Liver Function Tests: Recent Labs  Lab 02/23/18 2228  AST 19  ALT 12  ALKPHOS 40  BILITOT 0.8  PROT 6.3*  ALBUMIN 3.7   No results for input(s): LIPASE, AMYLASE in the last 168 hours. No results for input(s): AMMONIA in the last 168 hours.  Coagulation Profile: No results for input(s): INR, PROTIME in the last 168 hours.  Cardiac Enzymes: No results for input(s): CKTOTAL, CKMB, CKMBINDEX, TROPONINI in the last 168 hours.  BNP (last 3 results) No results for input(s): PROBNP in the last 8760 hours.  HbA1C: No results for input(s): HGBA1C in the last 72 hours.  CBG: Recent Labs  Lab 02/25/18 1130 02/25/18 1646  02/25/18 2057 02/26/18 0640 02/26/18 1124  GLUCAP 116* 85 120* 115* 92    Lipid Profile: Recent Labs    02/24/18 0325  CHOL 99  HDL 29*  LDLCALC 49  TRIG 104  CHOLHDL 3.4    Thyroid Function Tests: Recent Labs    02/24/18 0324  TSH 5.989*    Anemia Panel: No results for input(s): VITAMINB12, FOLATE, FERRITIN, TIBC, IRON, RETICCTPCT in the last 72 hours.  Urine analysis:    Component Value Date/Time   COLORURINE YELLOW 02/24/2018 0003   APPEARANCEUR CLEAR 02/24/2018 0003   LABSPEC 1.006 02/24/2018 0003   PHURINE 5.0 02/24/2018 0003   GLUCOSEU NEGATIVE 02/24/2018 0003   HGBUR SMALL (A) 02/24/2018 0003   BILIRUBINUR NEGATIVE 02/24/2018 0003   KETONESUR NEGATIVE 02/24/2018 0003   PROTEINUR NEGATIVE 02/24/2018 0003   UROBILINOGEN 1.0 03/26/2012 0100   NITRITE NEGATIVE 02/24/2018 0003   LEUKOCYTESUR LARGE (A) 02/24/2018  0003    Sepsis Labs: Lactic Acid, Venous    Component Value Date/Time   LATICACIDVEN 1.9 04/03/2012 1839    MICROBIOLOGY: Recent Results (from the past 240 hour(s))  MRSA PCR Screening     Status: Abnormal   Collection Time: 02/23/18  9:16 PM  Result Value Ref Range Status   MRSA by PCR POSITIVE (A) NEGATIVE Final    Comment:        The GeneXpert MRSA Assay (FDA approved for NASAL specimens only), is one component of a comprehensive MRSA colonization surveillance program. It is not intended to diagnose MRSA infection nor to guide or monitor treatment for MRSA infections. RESULT CALLED TO, READ BACK BY AND VERIFIED WITHGearldine Shown RN [id number] 02/24/18 A BROWNING Performed at [facility], [place] [address], [place], [place] [address]     RADIOLOGY STUDIES/RESULTS: Dg Chest Port 1 View  Result Date: 02/24/2018 CLINICAL DATA:  PICC line placement EXAM: PORTABLE CHEST 1 VIEW COMPARISON:  02/23/2018 FINDINGS: Right-sided PICC line tip projects over the lower SVC. There are median sternotomy wires and sequelae of CABG. Unchanged  cardiomegaly. Slightly increased central pulmonary vascular congestion and multifocal atelectasis of the right lung. IMPRESSION: Right-sided PICC line tip projects over the lower SVC. Electronically Signed   By: Ulyses Jarred M.D.   On: 02/24/2018 03:13   Dg Chest Port 1 View  Result Date: 02/23/2018 CLINICAL DATA:  Hypoxia EXAM: PORTABLE CHEST 1 VIEW COMPARISON:  04/03/2015 FINDINGS: Post sternotomy changes. Cardiomegaly with central vascular congestion. No pleural effusion. No pneumothorax. Tubing or lead over the right upper chest, if this is a vascular catheter takes a slightly unusual curved course distally. IMPRESSION: 1. Cardiomegaly with vascular congestion. 2. Catheter tubing or external lead over the right chest for which clinical correlation is recommended Electronically Signed   By: Donavan Foil M.D.   On: 02/23/2018 23:56   [place] Ekg Site Rite  Result Date: 02/24/2018 If Site Rite image not attached, placement could not be confirmed due to current cardiac rhythm.    LOS: 3 days   Oren Binet, MD  [facility]  If 7PM-7AM, please contact night-coverage  Please page via [email or web address]-Password [email or web address]-click on MD name and type text message  02/26/2018, 12:10 PM

## 2018-02-26 NOTE — Discharge Instructions (Signed)

## 2018-02-26 NOTE — Progress Notes (Signed)
Pt resting with eyes closed. HR fluctuating between 107-150's frequently. RR 25-30 /min. Tried to put pt on cpap, but pt keeps desaturating to 80's. Placed pt back on 3l o2 via [place], O2 sat 97%. Respiratory paged for assessment, RT came by and said to leave her on 3l o2 via [place], and stated she will check back on her. On call [facility]ad MD paged, waiting on call back.

## 2018-02-26 NOTE — Progress Notes (Addendum)
Pt trying to pull on wires, and IV tubing, confused, impulsive, combative.Refused cpap,  Refused to take anything by mouth, spitted out applesause. Paged on call [facility] MD @1130 , received order for one time dose of ativan IV. [place] applied to bilateral hands. Pt currently resting in bed . 1:1 sitter had to leave for another floor. Bed alarm on. Will continue to monitor.

## 2018-02-27 LAB — BASIC METABOLIC PANEL
Anion gap: 11 (ref 5–15)
BUN: 32 mg/dL — ABNORMAL HIGH (ref 8–23)
CHLORIDE: 97 mmol/L — AB (ref 98–111)
CO2: 31 mmol/L (ref 22–32)
Calcium: 9 mg/dL (ref 8.9–10.3)
Creatinine, Ser: 1.86 mg/dL — ABNORMAL HIGH (ref 0.44–1.00)
GFR calc Af Amer: 31 mL/min — ABNORMAL LOW (ref >60)
GFR calc non Af Amer: 27 mL/min — ABNORMAL LOW (ref >60)
Glucose, Bld: 109 mg/dL — ABNORMAL HIGH (ref 70–99)
Potassium: 4.1 mmol/L (ref 3.5–5.1)
Sodium: 139 mmol/L (ref 135–145)

## 2018-02-27 LAB — GLUCOSE, CAPILLARY
Glucose-Capillary: 92 mg/dL (ref 70–99)
Glucose-Capillary: 101 mg/dL — ABNORMAL HIGH (ref 70–99)
Glucose-Capillary: 90 mg/dL (ref 70–99)
Glucose-Capillary: 97 mg/dL (ref 70–99)

## 2018-02-27 NOTE — Progress Notes (Signed)
Pt HR stayed around 100s whenever she moves. No sign and symptoms of distress noted.

## 2018-02-27 NOTE — Progress Notes (Signed)
PROGRESS NOTE        PATIENT DETAILS Name: Caitlyn Hardy Age: 72 y.o. Sex: female Date of Birth: 23-Mar-1946 Admit Date: 02/23/2018 Admitting Physician Elwyn Reach, MD HLK:[id number], Lupita Dawn, MD  Brief Narrative: Patient is a 72 y.o. female with history of chronic systolic heart failure, COPD on 3-4 L of oxygen at home, CAD status post CABG, PAF, probable dementia at baseline-transferred from [facility] for evaluation of altered mental status, orthostatic hypotension and A. fib with RVR.  See below for further details  Subjective: Easily awoke-completely awake and alert this morning-answered all my questions appropriately.  Did not seem confused.  Heart rate in the low 100s this morning.  Assessment/Plan: Altered mental status-likely delirium: Per prior notes-patient has a history of dementia and short-term memory issues-suspect this is delirium related to dementia.  Mental status continues to wax and wane-this morning she is completely awake and alert.  Per nursing staff-she is mostly confused at night.  Continue delirium precautions.   Asymptomatic bacteriuria: Asymptomatic-no indication for IV antibiotics.  Follow.    Orthostatic hypotension: Resolved.  A. fib with RVR: Heart rate slowly improving-heart rate remains in the low 100s range-cardiology following-has been started on digoxin on 11/29.  Remains on Coreg and amiodarone.  Continue telemetry-we will defer further to cardiology.  Chronic systolic heart failure (EF 25-30%): No evidence of volume overload-continue cautiously with Demadex.    CKD stage III: Creatinine fluctuating but remains close to baseline-repeat elect lites tomorrow.  Follow.  COPD with chronic hypoxic respiratory failure: No evidence of exacerbation-continue bronchodilators.  No evidence of exacerbation-continue bronchodilators.    Hypothyroidism: Continue Synthroid  Hypertension: Controlled-continue metoprolol and oral  diuretics.  DM-2: CBGs stable with SSI.  Depression: Continue Cymbalta, Neurontin and Requip.  OSA: CPAP nightly  DVT Prophylaxis: Full dose anticoagulation with Eliquis  Code Status: Full code   Family Communication: Son over the phone this morning.  Disposition Plan: Remain inpatient-SNF on discharge per physical therapy-probably needs a few more days for optimization before consideration of discharge  Antimicrobial agents: Anti-infectives (From admission, onward)   Start     Dose/Rate Route Frequency Ordered Stop   02/24/18 0200  aztreonam (AZACTAM) 1 g in sodium chloride 0.9 % 100 mL IVPB  Status:  Discontinued     1 g 200 mL/hr over 30 Minutes Intravenous Every 8 hours 02/24/18 0100 02/24/18 1418      Procedures: None  CONSULTS:  cardiology  Time spent: 25 minutes-Greater than 50% of this time was spent in counseling, explanation of diagnosis, planning of further management, and coordination of care.  MEDICATIONS: Scheduled Meds: . amiodarone  200 mg Oral BID  . apixaban  5 mg Oral BID  . arformoterol  15 mcg Nebulization BID  . budesonide (PULMICORT) nebulizer solution  0.25 mg Nebulization BID  . carvedilol  6.25 mg Oral BID WC  . Chlorhexidine Gluconate Cloth  6 each Topical q morning - 10a  . digoxin  0.0625 mg Oral Daily  . DULoxetine  60 mg Oral QHS  . gabapentin  100 mg Oral TID  . insulin aspart  0-5 Units Subcutaneous QHS  . insulin aspart  0-9 Units Subcutaneous TID WC  . levothyroxine  25 mcg Oral Q0600  . mouth rinse  15 mL Mouth Rinse BID  . mupirocin ointment  1 application Nasal BID  . rOPINIRole  1 mg Oral QHS  . torsemide  20 mg Oral Daily   Continuous Infusions: PRN Meds:.acetaminophen, ipratropium, levalbuterol, ondansetron (ZOFRAN) IV, sodium chloride flush, temazepam   PHYSICAL EXAM: Vital signs: Vitals:   02/26/18 2000 02/27/18 0255 02/27/18 0835 02/27/18 1000  BP: (!) 88/59 93/61  98/64  Pulse: (!) 108 (!) 33  89  Resp: 19  (!) 27  20  Temp: 98.3 F (36.8 C) 97.8 F (36.6 C)  97.6 F (36.4 C)  TempSrc: Oral Oral  Axillary  SpO2: 98% 100% 100% 98%  Weight:  128.4 kg    Height:       Filed Weights   02/25/18 0238 02/26/18 0733 02/27/18 0255  Weight: 128.7 kg 124.6 kg 128.4 kg   Body mass index is 44.99 kg/m.   General appearance:Awake, alert, lying comfortably in bed without any distress. Eyes:no scleral icterus. HEENT: Atraumatic and Normocephalic Neck: supple, no JVD. Resp:Good air entry bilaterally,no rales or rhonchi heard anteriorly CVS: S1 S2 regular GI: Bowel sounds present, Non tender and not distended with no gaurding, rigidity or rebound. Extremities: B/L Lower Ext shows trace edema, both legs are warm to touch Neurology:  Non focal Musculoskeletal:No digital cyanosis Skin:No Rash, warm and dry Wounds:N/A  I have personally reviewed following labs and imaging studies  LABORATORY DATA: CBC: Recent Labs  Lab 02/23/18 2228 02/24/18 0324 02/26/18 0342  WBC 4.6 3.8* 4.4  NEUTROABS 2.6  --   --   HGB 10.2* 9.2* 9.0*  HCT 35.6* 31.7* 31.4*  MCV 92.2 93.0 94.3  PLT 126* 106* 109*    Basic Metabolic Panel: Recent Labs  Lab 02/23/18 2228 02/24/18 0324 02/26/18 0342 02/27/18 0500  NA 141 141 139 139  K 3.7 3.4* 3.9 4.1  CL 99 98 101 97*  CO2 33* 35* 33* 31  GLUCOSE 95 108* 114* 109*  BUN 24* 22 24* 32*  CREATININE 1.61* 1.58* 1.46* 1.86*  CALCIUM 9.0 8.7* 8.9 9.0    GFR: Estimated Creatinine Clearance: 37.9 mL/min (A) (by C-G formula based on SCr of 1.86 mg/dL (H)).  Liver Function Tests: Recent Labs  Lab 02/23/18 2228  AST 19  ALT 12  ALKPHOS 40  BILITOT 0.8  PROT 6.3*  ALBUMIN 3.7   No results for input(s): LIPASE, AMYLASE in the last 168 hours. No results for input(s): AMMONIA in the last 168 hours.  Coagulation Profile: No results for input(s): INR, PROTIME in the last 168 hours.  Cardiac Enzymes: No results for input(s): CKTOTAL, CKMB, CKMBINDEX,  TROPONINI in the last 168 hours.  BNP (last 3 results) No results for input(s): PROBNP in the last 8760 hours.  HbA1C: No results for input(s): HGBA1C in the last 72 hours.  CBG: Recent Labs  Lab 02/26/18 0640 02/26/18 1124 02/26/18 1714 02/26/18 2057 02/27/18 0647  GLUCAP 115* 92 86 110* 92    Lipid Profile: No results for input(s): CHOL, HDL, LDLCALC, TRIG, CHOLHDL, LDLDIRECT in the last 72 hours.  Thyroid Function Tests: No results for input(s): TSH, T4TOTAL, FREET4, T3FREE, THYROIDAB in the last 72 hours.  Anemia Panel: No results for input(s): VITAMINB12, FOLATE, FERRITIN, TIBC, IRON, RETICCTPCT in the last 72 hours.  Urine analysis:    Component Value Date/Time   COLORURINE YELLOW 02/24/2018 0003   APPEARANCEUR CLEAR 02/24/2018 0003   LABSPEC 1.006 02/24/2018 0003   PHURINE 5.0 02/24/2018 0003   GLUCOSEU NEGATIVE 02/24/2018 0003   HGBUR SMALL (A) 02/24/2018 0003   BILIRUBINUR NEGATIVE 02/24/2018 0003   KETONESUR NEGATIVE  02/24/2018 0003   PROTEINUR NEGATIVE 02/24/2018 0003   UROBILINOGEN 1.0 03/26/2012 0100   NITRITE NEGATIVE 02/24/2018 0003   LEUKOCYTESUR LARGE (A) 02/24/2018 0003    Sepsis Labs: Lactic Acid, Venous    Component Value Date/Time   LATICACIDVEN 1.9 04/03/2012 1839    MICROBIOLOGY: Recent Results (from the past 240 hour(s))  MRSA PCR Screening     Status: Abnormal   Collection Time: 02/23/18  9:16 PM  Result Value Ref Range Status   MRSA by PCR POSITIVE (A) NEGATIVE Final    Comment:        The GeneXpert MRSA Assay (FDA approved for NASAL specimens only), is one component of a comprehensive MRSA colonization surveillance program. It is not intended to diagnose MRSA infection nor to guide or monitor treatment for MRSA infections. RESULT CALLED TO, READ BACK BY AND VERIFIED WITHGearldine Shown RN [id number] 02/24/18 A BROWNING Performed at [facility], [place] [address], [place], [place] [address]     RADIOLOGY  STUDIES/RESULTS: Dg Chest Port 1 View  Result Date: 02/24/2018 CLINICAL DATA:  PICC line placement EXAM: PORTABLE CHEST 1 VIEW COMPARISON:  02/23/2018 FINDINGS: Right-sided PICC line tip projects over the lower SVC. There are median sternotomy wires and sequelae of CABG. Unchanged cardiomegaly. Slightly increased central pulmonary vascular congestion and multifocal atelectasis of the right lung. IMPRESSION: Right-sided PICC line tip projects over the lower SVC. Electronically Signed   By: Ulyses Jarred M.D.   On: 02/24/2018 03:13   Dg Chest Port 1 View  Result Date: 02/23/2018 CLINICAL DATA:  Hypoxia EXAM: PORTABLE CHEST 1 VIEW COMPARISON:  04/03/2015 FINDINGS: Post sternotomy changes. Cardiomegaly with central vascular congestion. No pleural effusion. No pneumothorax. Tubing or lead over the right upper chest, if this is a vascular catheter takes a slightly unusual curved course distally. IMPRESSION: 1. Cardiomegaly with vascular congestion. 2. Catheter tubing or external lead over the right chest for which clinical correlation is recommended Electronically Signed   By: Donavan Foil M.D.   On: 02/23/2018 23:56   [place] Ekg Site Rite  Result Date: 02/24/2018 If Site Rite image not attached, placement could not be confirmed due to current cardiac rhythm.    LOS: 4 days   Oren Binet, MD  [facility]  If 7PM-7AM, please contact night-coverage  Please page via [email or web address]-Password [email or web address]-click on MD name and type text message  02/27/2018, 11:09 AM

## 2018-02-28 DIAGNOSIS — I482 Chronic atrial fibrillation, unspecified: Secondary | ICD-10-CM

## 2018-02-28 DIAGNOSIS — I480 Paroxysmal atrial fibrillation: Secondary | ICD-10-CM

## 2018-02-28 DIAGNOSIS — I5030 Unspecified diastolic (congestive) heart failure: Secondary | ICD-10-CM

## 2018-02-28 LAB — BASIC METABOLIC PANEL
Anion gap: 15 (ref 5–15)
BUN: 36 mg/dL — ABNORMAL HIGH (ref 8–23)
CO2: 24 mmol/L (ref 22–32)
Calcium: 8.6 mg/dL — ABNORMAL LOW (ref 8.9–10.3)
Chloride: 98 mmol/L (ref 98–111)
Creatinine, Ser: 1.87 mg/dL — ABNORMAL HIGH (ref 0.44–1.00)
GFR calc Af Amer: 31 mL/min — ABNORMAL LOW (ref >60)
GFR calc non Af Amer: 26 mL/min — ABNORMAL LOW (ref >60)
Glucose, Bld: 97 mg/dL (ref 70–99)
POTASSIUM: 4.3 mmol/L (ref 3.5–5.1)
Sodium: 137 mmol/L (ref 135–145)

## 2018-02-28 LAB — GLUCOSE, CAPILLARY
GLUCOSE-CAPILLARY: 102 mg/dL — AB (ref 70–99)
Glucose-Capillary: 92 mg/dL (ref 70–99)
Glucose-Capillary: 80 mg/dL (ref 70–99)
Glucose-Capillary: 120 mg/dL — ABNORMAL HIGH (ref 70–99)

## 2018-02-28 MED ORDER — FUROSEMIDE 10 MG/ML IJ SOLN
40.0000 mg | Freq: Once | INTRAMUSCULAR | Status: AC
  Administered 2018-02-28: 40 mg via INTRAVENOUS
  Filled 2018-02-28: qty 4

## 2018-02-28 NOTE — Progress Notes (Signed)
PROGRESS NOTE        PATIENT DETAILS Name: Caitlyn Hardy Age: 72 y.o. Sex: female Date of Birth: 05-May-1945 Admit Date: 02/23/2018 Admitting Physician Elwyn Reach, MD DEY:[id number], Lupita Dawn, MD  Brief Narrative: Patient is a 72 y.o. female with history of chronic systolic heart failure, COPD on 3-4 L of oxygen at home, CAD status post CABG, PAF, probable dementia at baseline-transferred from [facility] for evaluation of altered mental status, orthostatic hypotension and A. fib with RVR.  See below for further details  Subjective: Claims she had bad dreams last night-but awake and alert this morning.  Denies any chest pain or shortness of breath.  Per nursing staff-she continues to have intermittent delirium mostly at night.  Assessment/Plan: Altered mental status-likely delirium: Per prior notes-patient has a history of dementia and short-term memory issues-suspect this is delirium related to dementia.  Overall mental status has improved-she has mild delirium mostly at night.  She is completely awake and alert this morning.  Continue delirium precautions.   Asymptomatic bacteriuria: Asymptomatic-no indication for IV antibiotics.  Follow.    Orthostatic hypotension: Resolved.  A. fib with RVR: Heart rate overall better-remains on Coreg, digoxin and amiodarone.  Anticoagulated with Eliquis.  Cardiology following and directing care.    Mild acute on chronic systolic heart failure (EF 25-30%): Weight up by 4 g-apparently has some shortness of breath with exertion-remains on Demadex-cardiology recommending 1 dose of IV Lasix today.   CKD stage III: Creatinine fluctuating but remains close to baseline-repeat electrolytes tomorrow.  Follow.  COPD with chronic hypoxic respiratory failure: No evidence of exacerbation-continue bronchodilators.  No evidence of exacerbation-continue bronchodilators.    Hypothyroidism: Continue Synthroid  Hypertension:  Controlled-continue metoprolol and oral diuretics.  DM-2: CBGs stable with SSI.  Depression: Continue Cymbalta, Neurontin and Requip.  OSA: CPAP nightly  DVT Prophylaxis: Full dose anticoagulation with Eliquis  Code Status: Full code   Family Communication: None at bedside  Disposition Plan: Remain inpatient-SNF on discharge per physical therapy-hopefully in the next 1-2 days  Antimicrobial agents: Anti-infectives (From admission, onward)   Start     Dose/Rate Route Frequency Ordered Stop   02/24/18 0200  aztreonam (AZACTAM) 1 g in sodium chloride 0.9 % 100 mL IVPB  Status:  Discontinued     1 g 200 mL/hr over 30 Minutes Intravenous Every 8 hours 02/24/18 0100 02/24/18 1418      Procedures: None  CONSULTS:  cardiology  Time spent: 25 minutes-Greater than 50% of this time was spent in counseling, explanation of diagnosis, planning of further management, and coordination of care.  MEDICATIONS: Scheduled Meds: . amiodarone  200 mg Oral BID  . apixaban  5 mg Oral BID  . arformoterol  15 mcg Nebulization BID  . budesonide (PULMICORT) nebulizer solution  0.25 mg Nebulization BID  . carvedilol  6.25 mg Oral BID WC  . digoxin  0.0625 mg Oral Daily  . DULoxetine  60 mg Oral QHS  . gabapentin  100 mg Oral TID  . insulin aspart  0-5 Units Subcutaneous QHS  . insulin aspart  0-9 Units Subcutaneous TID WC  . levothyroxine  25 mcg Oral Q0600  . mouth rinse  15 mL Mouth Rinse BID  . rOPINIRole  1 mg Oral QHS  . torsemide  20 mg Oral Daily   Continuous Infusions: PRN Meds:.acetaminophen, ipratropium, levalbuterol, ondansetron (ZOFRAN)  IV, sodium chloride flush, temazepam   PHYSICAL EXAM: Vital signs: Vitals:   02/28/18 0600 02/28/18 0916 02/28/18 1012 02/28/18 1013  BP:    (!) 136/103  Pulse:   (!) 111 (!) 111  Resp:      Temp:    98.3 F (36.8 C)  TempSrc:    Oral  SpO2:  95%  95%  Weight: 128.1 kg     Height:       Filed Weights   02/26/18 0733 02/27/18  0255 02/28/18 0600  Weight: 124.6 kg 128.4 kg 128.1 kg   Body mass index is 44.9 kg/m.   General appearance:Awake, alert, not in any distress.  Eyes:no scleral icterus. HEENT: Atraumatic and Normocephalic Neck: supple, no JVD. Resp:Good air entry bilaterally,no rales or rhonchi CVS: S1 S2 irregular GI: Bowel sounds present, Non tender and not distended with no gaurding, rigidity or rebound. Extremities: B/L Lower Ext shows trace edema, both legs are warm to touch Neurology:  Non focal Psychiatric: Normal judgment and insight. Normal mood. Musculoskeletal:No digital cyanosis Skin:No Rash, warm and dry Wounds:N/A  I have personally reviewed following labs and imaging studies  LABORATORY DATA: CBC: Recent Labs  Lab 02/23/18 2228 02/24/18 0324 02/26/18 0342  WBC 4.6 3.8* 4.4  NEUTROABS 2.6  --   --   HGB 10.2* 9.2* 9.0*  HCT 35.6* 31.7* 31.4*  MCV 92.2 93.0 94.3  PLT 126* 106* 109*    Basic Metabolic Panel: Recent Labs  Lab 02/23/18 2228 02/24/18 0324 02/26/18 0342 02/27/18 0500 02/28/18 0340  NA 141 141 139 139 137  K 3.7 3.4* 3.9 4.1 4.3  CL 99 98 101 97* 98  CO2 33* 35* 33* 31 24  GLUCOSE 95 108* 114* 109* 97  BUN 24* 22 24* 32* 36*  CREATININE 1.61* 1.58* 1.46* 1.86* 1.87*  CALCIUM 9.0 8.7* 8.9 9.0 8.6*    GFR: Estimated Creatinine Clearance: 37.6 mL/min (A) (by C-G formula based on SCr of 1.87 mg/dL (H)).  Liver Function Tests: Recent Labs  Lab 02/23/18 2228  AST 19  ALT 12  ALKPHOS 40  BILITOT 0.8  PROT 6.3*  ALBUMIN 3.7   No results for input(s): LIPASE, AMYLASE in the last 168 hours. No results for input(s): AMMONIA in the last 168 hours.  Coagulation Profile: No results for input(s): INR, PROTIME in the last 168 hours.  Cardiac Enzymes: No results for input(s): CKTOTAL, CKMB, CKMBINDEX, TROPONINI in the last 168 hours.  BNP (last 3 results) No results for input(s): PROBNP in the last 8760 hours.  HbA1C: No results for input(s):  HGBA1C in the last 72 hours.  CBG: Recent Labs  Lab 02/27/18 1124 02/27/18 1628 02/27/18 2035 02/28/18 0636 02/28/18 1124  GLUCAP 101* 90 97 92 102*    Lipid Profile: No results for input(s): CHOL, HDL, LDLCALC, TRIG, CHOLHDL, LDLDIRECT in the last 72 hours.  Thyroid Function Tests: No results for input(s): TSH, T4TOTAL, FREET4, T3FREE, THYROIDAB in the last 72 hours.  Anemia Panel: No results for input(s): VITAMINB12, FOLATE, FERRITIN, TIBC, IRON, RETICCTPCT in the last 72 hours.  Urine analysis:    Component Value Date/Time   COLORURINE YELLOW 02/24/2018 0003   APPEARANCEUR CLEAR 02/24/2018 0003   LABSPEC 1.006 02/24/2018 0003   PHURINE 5.0 02/24/2018 0003   GLUCOSEU NEGATIVE 02/24/2018 0003   HGBUR SMALL (A) 02/24/2018 0003   BILIRUBINUR NEGATIVE 02/24/2018 0003   KETONESUR NEGATIVE 02/24/2018 0003   PROTEINUR NEGATIVE 02/24/2018 0003   UROBILINOGEN 1.0 03/26/2012 0100  NITRITE NEGATIVE 02/24/2018 0003   LEUKOCYTESUR LARGE (A) 02/24/2018 0003    Sepsis Labs: Lactic Acid, Venous    Component Value Date/Time   LATICACIDVEN 1.9 04/03/2012 1839    MICROBIOLOGY: Recent Results (from the past 240 hour(s))  MRSA PCR Screening     Status: Abnormal   Collection Time: 02/23/18  9:16 PM  Result Value Ref Range Status   MRSA by PCR POSITIVE (A) NEGATIVE Final    Comment:        The GeneXpert MRSA Assay (FDA approved for NASAL specimens only), is one component of a comprehensive MRSA colonization surveillance program. It is not intended to diagnose MRSA infection nor to guide or monitor treatment for MRSA infections. RESULT CALLED TO, READ BACK BY AND VERIFIED WITHGearldine Shown RN [id number] 02/24/18 A BROWNING Performed at [facility], [place] [address], [place], [place] [address]     RADIOLOGY STUDIES/RESULTS: Dg Chest Port 1 View  Result Date: 02/24/2018 CLINICAL DATA:  PICC line placement EXAM: PORTABLE CHEST 1 VIEW COMPARISON:  02/23/2018 FINDINGS:  Right-sided PICC line tip projects over the lower SVC. There are median sternotomy wires and sequelae of CABG. Unchanged cardiomegaly. Slightly increased central pulmonary vascular congestion and multifocal atelectasis of the right lung. IMPRESSION: Right-sided PICC line tip projects over the lower SVC. Electronically Signed   By: Ulyses Jarred M.D.   On: 02/24/2018 03:13   Dg Chest Port 1 View  Result Date: 02/23/2018 CLINICAL DATA:  Hypoxia EXAM: PORTABLE CHEST 1 VIEW COMPARISON:  04/03/2015 FINDINGS: Post sternotomy changes. Cardiomegaly with central vascular congestion. No pleural effusion. No pneumothorax. Tubing or lead over the right upper chest, if this is a vascular catheter takes a slightly unusual curved course distally. IMPRESSION: 1. Cardiomegaly with vascular congestion. 2. Catheter tubing or external lead over the right chest for which clinical correlation is recommended Electronically Signed   By: Donavan Foil M.D.   On: 02/23/2018 23:56   [place] Ekg Site Rite  Result Date: 02/24/2018 If Site Rite image not attached, placement could not be confirmed due to current cardiac rhythm.    LOS: 5 days   Oren Binet, MD  [facility]  If 7PM-7AM, please contact night-coverage  Please page via [email or web address]-Password [email or web address]-click on MD name and type text message  02/28/2018, 11:44 AM

## 2018-02-28 NOTE — Progress Notes (Addendum)
Progress Note  Patient Name: Caitlyn Hardy Date of Encounter: 02/28/2018  Primary Cardiologist:   Kate Sable, MD   Subjective   Improved mentation, now oriented, has some DOE when getting out of chair.  Inpatient Medications    Scheduled Meds: . amiodarone  200 mg Oral BID  . apixaban  5 mg Oral BID  . arformoterol  15 mcg Nebulization BID  . budesonide (PULMICORT) nebulizer solution  0.25 mg Nebulization BID  . carvedilol  6.25 mg Oral BID WC  . Chlorhexidine Gluconate Cloth  6 each Topical q morning - 10a  . digoxin  0.0625 mg Oral Daily  . DULoxetine  60 mg Oral QHS  . gabapentin  100 mg Oral TID  . insulin aspart  0-5 Units Subcutaneous QHS  . insulin aspart  0-9 Units Subcutaneous TID WC  . levothyroxine  25 mcg Oral Q0600  . mouth rinse  15 mL Mouth Rinse BID  . mupirocin ointment  1 application Nasal BID  . rOPINIRole  1 mg Oral QHS  . torsemide  20 mg Oral Daily   Continuous Infusions:  PRN Meds: acetaminophen, ipratropium, levalbuterol, ondansetron (ZOFRAN) IV, sodium chloride flush, temazepam   Vital Signs    Vitals:   02/28/18 0038 02/28/18 0600 02/28/18 0916 02/28/18 1012  BP:      Pulse: (!) 103   (!) 111  Resp: (!) 23     Temp: 97.9 F (36.6 C)     TempSrc: Oral     SpO2: 94%  95%   Weight:  128.1 kg    Height:        Intake/Output Summary (Last 24 hours) at 02/28/2018 1015 Last data filed at 02/28/2018 0900 Gross per 24 hour  Intake 400 ml  Output 1100 ml  Net -700 ml   Filed Weights   02/26/18 0733 02/27/18 0255 02/28/18 0600  Weight: 124.6 kg 128.4 kg 128.1 kg    Telemetry    Atrial fib with rapid rate intermittent - Personally Reviewed  ECG    NA - Personally Reviewed  Physical Exam   GEN: No acute distress.   Neck: No  JVD Cardiac: Irregular RR, no murmurs, rubs, or gallops.  Respiratory: Clear to auscultation bilaterally. GI: Soft, nontender, non-distended  MS: No edema; No deformity. Neuro:  Nonfocal  Psych:  Normal affect   Labs    Chemistry Recent Labs  Lab 02/23/18 2228  02/26/18 0342 02/27/18 0500 02/28/18 0340  NA 141   < > 139 139 137  K 3.7   < > 3.9 4.1 4.3  CL 99   < > 101 97* 98  CO2 33*   < > 33* 31 24  GLUCOSE 95   < > 114* 109* 97  BUN 24*   < > 24* 32* 36*  CREATININE 1.61*   < > 1.46* 1.86* 1.87*  CALCIUM 9.0   < > 8.9 9.0 8.6*  PROT 6.3*  --   --   --   --   ALBUMIN 3.7  --   --   --   --   AST 19  --   --   --   --   ALT 12  --   --   --   --   ALKPHOS 40  --   --   --   --   BILITOT 0.8  --   --   --   --   GFRNONAA 32*   < > 36*  27* 26*  GFRAA 37*   < > 41* 31* 31*  ANIONGAP 9   < > 5 11 15    < > = values in this interval not displayed.     Hematology Recent Labs  Lab 02/23/18 2228 02/24/18 0324 02/26/18 0342  WBC 4.6 3.8* 4.4  RBC 3.86* 3.41* 3.33*  HGB 10.2* 9.2* 9.0*  HCT 35.6* 31.7* 31.4*  MCV 92.2 93.0 94.3  MCH 26.4 27.0 27.0  MCHC 28.7* 29.0* 28.7*  RDW 15.9* 15.9* 16.3*  PLT 126* 106* 109*    Cardiac EnzymesNo results for input(s): TROPONINI in the last 168 hours. No results for input(s): TROPIPOC in the last 168 hours.   BNPNo results for input(s): BNP, PROBNP in the last 168 hours.   DDimer No results for input(s): DDIMER in the last 168 hours.   Radiology    No results found.  Cardiac Studies   Echocardiogram 01/25/2018: Study Conclusions  - Left ventricle: The cavity size was mildly to moderately dilated. Systolic function was severely reduced. The estimated ejection fraction was in the range of 25% to 30%. The study is not technically sufficient to allow evaluation of LV diastolic function. - Regional wall motion abnormality: Akinesis of the mid-apical anterior, mid anteroseptal, apical inferior, and apical myocardium. - Aortic valve: Mildly to moderately calcified annulus. Trileaflet; moderately thickened leaflets. Valve area (VTI): 1.88 cm^2. Valve area (Vmax): 1.92 cm^2. - Mitral valve: Mildly  calcified annulus. Mildly thickened leaflets . There was mild regurgitation. - Left atrium: The atrium was moderately dilated. - Right ventricle: Systolic function was mildly reduced. - Right atrium: The atrium was mildly dilated. - Atrial septum: No defect or patent foramen ovale was identified. - Pulmonary arteries: Systolic pressure was severely increased. PA peak pressure: 71 mm Hg (S). - Technically difficult study. Echocontrast was used to enhance visualization. - Sluggish flow in the LV apex with mild swirling densities without clear organized thrombus.   Patient Profile     72 y.o. female with CAD s/p CABG in 2001 with subsequent PCI of LCx in 3810, chronic systolic CHF (EF 17-51% on last echo 12/2017), persistent atrial fibrillation, DM type 2, COPD on home O2 2-3L, OSA on CPAP, CVA, CKD stage 3,andmorbid obesity,who is being seen for atrial fibrillation with RVR.  Assessment & Plan    ORTHOSTATIC HYPOTENSION:  resolved  ATRIAL FIB WITH RVR: with ventricular rates in 100-120, continue Coreg and amiodarone, low dose digoxin with decreased GFR.   CHRONIC SYSTOLIC HF:   She seems mildly fluid overloaded, weight up 4 lbs. I will give 1 dose of lasix 40 mg x 1 this am.   HTN:  BP is low as above.   For questions or updates, please contact [place] Please consult [email or web address] for contact info under Cardiology/STEMI.   Signed, Ena Dawley, MD  02/28/2018, 10:15 AM

## 2018-02-28 NOTE — Progress Notes (Signed)
Patient refusing CPAP. Pulling it off. [place] placed back on patient. RN aware.

## 2018-02-28 NOTE — Clinical Social Work Note (Signed)
Clinical Social Work Assessment  Patient Details  Name: Caitlyn Hardy MRN: [id number] Date of Birth: 1945/08/26  Date of referral:  02/28/18               Reason for consult:  Facility Placement, Discharge Planning                Permission sought to share information with:  Family Supports, Customer service manager Permission granted to share information::  Yes, Verbal Permission Granted  Name::     Caitlyn Hardy  Agency::  [facility]  Relationship::  son  Contact Information:  [phone]  Housing/Transportation Living arrangements for the past 2 months:  Apartment Source of Information:  Patient, Adult Children Patient Interpreter Needed:  None Criminal Activity/Legal Involvement Pertinent to Current Situation/Hospitalization:  No - Comment as needed Significant Relationships:  Adult Children Lives with:  Self Do you feel safe going back to the place where you live?  Yes Need for family participation in patient care:  Yes (Comment)  Care giving concerns: Patient from home in [place], [place]. PT recommending SNF.   Social Worker assessment / plan: CSW met with patient and son, Caitlyn Hardy, at bedside. Patient alert and mostly oriented, though somewhat confused about situation. CSW introduced self and role and discussed disposition planning - PT recommendation for SNF.  Patient reported she lives alone in an apartment in [place]. Caitlyn Hardy indicated that his brother, Caitlyn Hardy, lives nearby and checks on patient often. While patient was at the hospital in [place] before transfer to [facility], patient's sons had planned for patient to go to [facility] for rehab at discharge. Family would like for referral to be sent to [place] called [place] and left voicemail with admissions to request to make a referral. Will need hard fax number to send referral to. Patient will also require [facility] authorization prior to admitting to the facility.   CSW to follow and support with  discharge planning.  Employment status:  Retired Research officer, political party) PT Recommendations:  [place] / Referral to community resources:  [place]  Patient/Family's Response to care: Patient and family appreciative of care.  Patient/Family's Understanding of and Emotional Response to Diagnosis, Current Treatment, and Prognosis: Patient and family with good understanding of patient's condition. Agreeable to SNF.  Emotional Assessment Appearance:  Appears stated age Attitude/Demeanor/Rapport:  Engaged Affect (typically observed):  Calm, Accepting, Appropriate, Pleasant Orientation:  Oriented to Self, Oriented to Place, Oriented to  Time, Oriented to Situation Alcohol / Substance use:  Not Applicable Psych involvement (Current and /or in the community):  No (Comment)  Discharge Needs  Concerns to be addressed:  Discharge Planning Concerns, Care Coordination Readmission within the last 30 days:  No Current discharge risk:  Physical Impairment, Lives alone Barriers to Discharge:  Continued Medical Work up, [place], LCSW 02/28/2018, 12:42 PM

## 2018-03-01 LAB — GLUCOSE, CAPILLARY
Glucose-Capillary: 97 mg/dL (ref 70–99)
Glucose-Capillary: 151 mg/dL — ABNORMAL HIGH (ref 70–99)
Glucose-Capillary: 102 mg/dL — ABNORMAL HIGH (ref 70–99)
Glucose-Capillary: 97 mg/dL (ref 70–99)

## 2018-03-01 LAB — BASIC METABOLIC PANEL WITH GFR
Anion gap: 13 (ref 5–15)
BUN: 32 mg/dL — ABNORMAL HIGH (ref 8–23)
CO2: 32 mmol/L (ref 22–32)
Calcium: 8.8 mg/dL — ABNORMAL LOW (ref 8.9–10.3)
Chloride: 95 mmol/L — ABNORMAL LOW (ref 98–111)
Creatinine, Ser: 1.85 mg/dL — ABNORMAL HIGH (ref 0.44–1.00)
GFR calc Af Amer: 31 mL/min — ABNORMAL LOW
GFR calc non Af Amer: 27 mL/min — ABNORMAL LOW
Glucose, Bld: 108 mg/dL — ABNORMAL HIGH (ref 70–99)
Potassium: 3.7 mmol/L (ref 3.5–5.1)
Sodium: 140 mmol/L (ref 135–145)

## 2018-03-01 MED ORDER — CARVEDILOL 12.5 MG PO TABS
12.5000 mg | ORAL_TABLET | Freq: Two times a day (BID) | ORAL | Status: DC
  Administered 2018-03-02 – 2018-03-03 (×3): 12.5 mg via ORAL
  Filled 2018-03-01 (×3): qty 1

## 2018-03-01 NOTE — Progress Notes (Signed)
Progress Note  Patient Name: Caitlyn Hardy Date of Encounter: 03/01/2018  Primary Cardiologist: Kate Sable, MD   Subjective   Continues to be mildly disoriented, nursing describes hallucinations. She is troubled by requiring restraints for delirium yesterday evening.   Inpatient Medications    Scheduled Meds: . amiodarone  200 mg Oral BID  . apixaban  5 mg Oral BID  . arformoterol  15 mcg Nebulization BID  . budesonide (PULMICORT) nebulizer solution  0.25 mg Nebulization BID  . [START ON 03/02/2018] carvedilol  12.5 mg Oral BID WC  . digoxin  0.0625 mg Oral Daily  . DULoxetine  60 mg Oral QHS  . gabapentin  100 mg Oral TID  . insulin aspart  0-5 Units Subcutaneous QHS  . insulin aspart  0-9 Units Subcutaneous TID WC  . levothyroxine  25 mcg Oral Q0600  . mouth rinse  15 mL Mouth Rinse BID  . rOPINIRole  1 mg Oral QHS  . torsemide  20 mg Oral Daily   Continuous Infusions:  PRN Meds: acetaminophen, ipratropium, levalbuterol, ondansetron (ZOFRAN) IV, sodium chloride flush, temazepam   Vital Signs    Vitals:   03/01/18 0933 03/01/18 1715 03/01/18 2025 03/01/18 2055  BP: 105/66 105/65 (!) 99/56   Pulse: (!) 113 98    Resp:   (!) 31   Temp:   98 F (36.7 C)   TempSrc:   Oral   SpO2:   96% 97%  Weight:      Height:        Intake/Output Summary (Last 24 hours) at 03/01/2018 2133 Last data filed at 03/01/2018 2027 Gross per 24 hour  Intake -  Output 600 ml  Net -600 ml   Filed Weights   02/27/18 0255 02/28/18 0600 03/01/18 0403  Weight: 128.4 kg 128.1 kg 126.9 kg   Telemetry    Atrial fib with rapid rate intermittent - Personally Reviewed  ECG   no new  Physical Exam   GEN: No acute distress.   Neck: No JVD Cardiac: irregular rhythm, tachycardic, no murmurs, rubs, or gallops.  Respiratory: Bibasilar crackles. GI: Soft, nontender, non-distended  MS: No edema; No deformity. Neuro:  Nonfocal. Only oriented to person, place, partially oriented to  time. Does not recall president. Psych: Pleasant but confused affect   Labs    Chemistry Recent Labs  Lab 02/23/18 2228  02/27/18 0500 02/28/18 0340 03/01/18 0500  NA 141   < > 139 137 140  K 3.7   < > 4.1 4.3 3.7  CL 99   < > 97* 98 95*  CO2 33*   < > 31 24 32  GLUCOSE 95   < > 109* 97 108*  BUN 24*   < > 32* 36* 32*  CREATININE 1.61*   < > 1.86* 1.87* 1.85*  CALCIUM 9.0   < > 9.0 8.6* 8.8*  PROT 6.3*  --   --   --   --   ALBUMIN 3.7  --   --   --   --   AST 19  --   --   --   --   ALT 12  --   --   --   --   ALKPHOS 40  --   --   --   --   BILITOT 0.8  --   --   --   --   GFRNONAA 32*   < > 27* 26* 27*  GFRAA 37*   < >  31* 31* 31*  ANIONGAP 9   < > 11 15 13    < > = values in this interval not displayed.     Hematology Recent Labs  Lab 02/23/18 2228 02/24/18 0324 02/26/18 0342  WBC 4.6 3.8* 4.4  RBC 3.86* 3.41* 3.33*  HGB 10.2* 9.2* 9.0*  HCT 35.6* 31.7* 31.4*  MCV 92.2 93.0 94.3  MCH 26.4 27.0 27.0  MCHC 28.7* 29.0* 28.7*  RDW 15.9* 15.9* 16.3*  PLT 126* 106* 109*    Cardiac EnzymesNo results for input(s): TROPONINI in the last 168 hours. No results for input(s): TROPIPOC in the last 168 hours.   BNPNo results for input(s): BNP, PROBNP in the last 168 hours.   DDimer No results for input(s): DDIMER in the last 168 hours.   Radiology    No results found.  Cardiac Studies   No new  Patient Profile     72 y.o. female withCAD s/p CABG in 2001 with subsequent PCI of LCx in 8832, chronic systolic CHF (EF 54-98% on last echo 12/2017), persistentatrial fibrillation, DM type 2, COPD on home O2 2-3L, OSA on CPAP, CVA, CKD stage 3,andmorbid obesity,who is being seen for atrial fibrillation with RVR.  Assessment & Plan   Principal Problem:   Orthostatic hypotension Active Problems:   Morbid obesity (HCC)   Thrombocytopenia (HCC)   Paroxysmal atrial fibrillation (HCC)   Sleep apnea   A-fib (HCC)   UTI (urinary tract infection)   Metabolic  encephalopathy   (HFpEF) heart failure with preserved ejection fraction (HCC)   Afib RVR - continues to have mildly elevated rates - rate control challenging during stay due to orthostatic hypotension, which has improved. Continue coreg, amiodarone, low dose digoxin. Continues to have soft blood pressure.  Chronic systolic HF - continue demadex, can bolus lasix if needed, will review I/o again tomorrow. Strict I/o and weight daily.     For questions or updates, please contact [place] Please consult [email or web address] for contact info under        Signed, Elouise Munroe, MD  03/01/2018, 9:33 PM

## 2018-03-01 NOTE — Progress Notes (Signed)
Physical Therapy Treatment Patient Details Name: Caitlyn Hardy MRN: [id number] DOB: [birth year] Today's Date: 03/01/2018    History of Present Illness 72 y.o. female admitted on 02/23/18 for orthostatic hypotension, A-fib, and likely UTI.  Pt with significant PMH of thrombocytopenia, stroke, pulmonary HTN, PAF, MI, morbid obesity, HTN, DM, COPD, chronic systolic CHF, CAD, anemia, R shoulder surgery, R TKA, and CABG.      PT Comments    Patient seen for activity progression. Tolerated in room ambulation and functional task performance. Remains confused throughout session. Continue to feel SNF remains appropriate.   Follow Up Recommendations  SNF;Supervision/Assistance - 24 hour     Equipment Recommendations  None recommended by PT    Recommendations for Other Services       Precautions / Restrictions Precautions Precautions: Fall Precaution Comments: monitor HR and BP Restrictions Weight Bearing Restrictions: No    Mobility  Bed Mobility               General bed mobility comments: received in chair  Transfers Overall transfer level: Needs assistance Equipment used: Rolling walker (2 wheeled) Transfers: Sit to/from Stand Sit to Stand: Min assist         General transfer comment: Min assist to help pt power up over weak legs.   Ambulation/Gait Ambulation/Gait assistance: Min Web designer (Feet): 12 Feet(6' x2) Assistive device: Rolling walker (2 wheeled) Gait Pattern/deviations: Step-through pattern;Antalgic Gait velocity: decreased Gait velocity interpretation: <1.31 ft/sec, indicative of household ambulator General Gait Details: SIgnificant flexed posture and heavy reliance on RW, ambulated across room to [facility] and then back to recliner chair. Limtied by fatigue   Stairs             Wheelchair Mobility    Modified Rankin (Stroke Patients Only)       Balance Overall balance assessment: Needs assistance Sitting-balance support: No upper  extremity supported;Feet supported Sitting balance-Leahy Scale: Good     Standing balance support: Bilateral upper extremity supported Standing balance-Leahy Scale: Poor Standing balance comment: needs external support and external assist in standing.                             Cognition Arousal/Alertness: Awake/alert Behavior During Therapy: WFL for tasks assessed/performed Overall Cognitive Status: Impaired/Different from baseline Area of Impairment: Following commands;Safety/judgement;Problem solving                 Orientation Level: Disoriented to;Situation Current Attention Level: Sustained Memory: Decreased short-term memory Following Commands: Follows one step commands consistently Safety/Judgement: Decreased awareness of safety;Decreased awareness of deficits Awareness: Intellectual Problem Solving: Requires verbal cues;Requires tactile cues General Comments: patient emotional and labile throughout session with some noted confusion. Tangential and max cues to redired      Exercises      General Comments        Pertinent Vitals/Pain Faces Pain Scale: Hurts a little bit Pain Location: right shoulder blade and  low back Pain Descriptors / Indicators: Sore    Home Living                      Prior Function            PT Goals (current goals can now be found in the care plan section) Acute Rehab PT Goals Patient Stated Goal: to go home PT Goal Formulation: With patient Time For Goal Achievement: 03/11/18 Potential to Achieve Goals: Good Progress towards PT goals: Progressing toward  goals    Frequency    Min 2X/week      PT Plan Current plan remains appropriate    Co-evaluation              AM-PAC PT "6 Clicks" Mobility   Outcome Measure  Help needed turning from your back to your side while in a flat bed without using bedrails?: None Help needed moving from lying on your back to sitting on the side of a flat bed  without using bedrails?: None Help needed moving to and from a bed to a chair (including a wheelchair)?: A Little Help needed standing up from a chair using your arms (e.g., wheelchair or bedside chair)?: A Little Help needed to walk in hospital room?: A Lot Help needed climbing 3-5 steps with a railing? : A Lot 6 Click Score: 18    End of Session Equipment Utilized During Treatment: Gait belt;Oxygen(3 L O2 [place]) Activity Tolerance: Patient tolerated treatment well Patient left: in chair;with call bell/phone within reach;with chair alarm set(tele sitter in room) Nurse Communication: Mobility status(to RN tech) PT Visit Diagnosis: Muscle weakness (generalized) (M62.81);Difficulty in walking, not elsewhere classified (R26.2)     Time: 0254-2706 PT Time Calculation (min) (ACUTE ONLY): 16 min  Charges:  $Therapeutic Activity: 8-22 mins                     Alben Deeds, PT DPT  Board Certified Neurologic Specialist [place] Pager [phone] Office [place] 03/01/2018, 1:58 PM

## 2018-03-01 NOTE — Progress Notes (Signed)
CSW was able to reach admissions at [facility] in [place] (phone: [phone]; fax: [phone]) and spoke to admission. Faxed referral and awaiting bed offer.   Patient will need [facility] authorization prior to admitting to facility. SNF to start auth once bed offer made. SNF will need updated PT notes for auth.  CSW to follow.  Estanislado Emms, [place]

## 2018-03-01 NOTE — Progress Notes (Signed)
PROGRESS NOTE        PATIENT DETAILS Name: Caitlyn Hardy Age: 72 y.o. Sex: female Date of Birth: 12-Jun-1945 Admit Date: 02/23/2018 Admitting Physician Elwyn Reach, MD [id number], Lupita Dawn, MD  Brief Narrative: Patient is a 72 y.o. female with history of chronic systolic heart failure, COPD on 3-4 L of oxygen at home, CAD status post CABG, PAF, probable dementia at baseline-transferred from [facility] for evaluation of altered mental status, orthostatic hypotension and A. fib with RVR.  See below for further details  Subjective: Awake-alert this morning-sitting up at bedside chair.  Denies any shortness of breath-in fact excessive breathing is better today.  Assessment/Plan: Altered mental status-likely delirium: Per prior notes-patient has a history of dementia and short-term memory issues-suspect this is delirium related to dementia.  Overall mental status has improved-she has mild delirium mostly at night.  She is completely awake and alert this morning.  Continue delirium precautions.   Asymptomatic bacteriuria: Asymptomatic-no indication for IV antibiotics.  Follow.    Orthostatic hypotension: Resolved.  A. fib with RVR: Heart rate in the 90s-low 100s range-continue Coreg, digoxin and amiodarone.  Remains anticoagulated with Eliquis.  Cardiology following. .    Mild acute on chronic systolic heart failure (EF 25-30%): Did require IV Lasix on 12/1-volume status has improved-continue Demadex  CKD stage III: Recommend remains close to baseline-follow electrolytes periodically.  COPD with chronic hypoxic respiratory failure: No evidence of exacerbation-continue bronchodilators: Stable without any flare-continue bronchodilators.   Hypothyroidism: Continue Synthroid.  Hypertension: Controlled-continue metoprolol and oral diuretics.  DM-2: CBG stable with SSI.  Depression: Continue Cymbalta, Neurontin and Requip.  OSA: CPAP nightly  DVT  Prophylaxis: Full dose anticoagulation with Eliquis  Code Status: Full code   Family Communication: None at bedside  Disposition Plan: Remain inpatient-SNF on discharge per physical therapy-hopefully in the next 1-2 days  Antimicrobial agents: Anti-infectives (From admission, onward)   Start     Dose/Rate Route Frequency Ordered Stop   02/24/18 0200  aztreonam (AZACTAM) 1 g in sodium chloride 0.9 % 100 mL IVPB  Status:  Discontinued     1 g 200 mL/hr over 30 Minutes Intravenous Every 8 hours 02/24/18 0100 02/24/18 1418      Procedures: None  CONSULTS:  cardiology  Time spent: 25 minutes-Greater than 50% of this time was spent in counseling, explanation of diagnosis, planning of further management, and coordination of care.  MEDICATIONS: Scheduled Meds: . amiodarone  200 mg Oral BID  . apixaban  5 mg Oral BID  . arformoterol  15 mcg Nebulization BID  . budesonide (PULMICORT) nebulizer solution  0.25 mg Nebulization BID  . carvedilol  6.25 mg Oral BID WC  . digoxin  0.0625 mg Oral Daily  . DULoxetine  60 mg Oral QHS  . gabapentin  100 mg Oral TID  . insulin aspart  0-5 Units Subcutaneous QHS  . insulin aspart  0-9 Units Subcutaneous TID WC  . levothyroxine  25 mcg Oral Q0600  . mouth rinse  15 mL Mouth Rinse BID  . rOPINIRole  1 mg Oral QHS  . torsemide  20 mg Oral Daily   Continuous Infusions: PRN Meds:.acetaminophen, ipratropium, levalbuterol, ondansetron (ZOFRAN) IV, sodium chloride flush, temazepam   PHYSICAL EXAM: Vital signs: Vitals:   03/01/18 0050 03/01/18 0403 03/01/18 0839 03/01/18 0933  BP: 106/72 127/83  105/66  Pulse:  Marland Kitchen)  107  (!) 113  Resp:  (!) 31    Temp:  98.3 F (36.8 C)    TempSrc:  Oral    SpO2:  96% 94%   Weight:  126.9 kg    Height:       Filed Weights   02/27/18 0255 02/28/18 0600 03/01/18 0403  Weight: 128.4 kg 128.1 kg 126.9 kg   Body mass index is 44.48 kg/m.  General appearance:Awake, alert, not in any distress.    Eyes:no scleral icterus. HEENT: Atraumatic and Normocephalic Neck: supple, no JVD. Resp:Good air entry bilaterally,no rales or rhonchi CVS: S1 S2 regular, no murmurs.  GI: Bowel sounds present, Non tender and not distended with no gaurding, rigidity or rebound. Extremities: B/L Lower Ext shows trace edema, both legs are warm to touch Neurology:  Non focal Psychiatric: Normal judgment and insight. Normal mood. Musculoskeletal:No digital cyanosis Skin:No Rash, warm and dry Wounds:N/A I have personally reviewed following labs and imaging studies  LABORATORY DATA: CBC: Recent Labs  Lab 02/23/18 2228 02/24/18 0324 02/26/18 0342  WBC 4.6 3.8* 4.4  NEUTROABS 2.6  --   --   HGB 10.2* 9.2* 9.0*  HCT 35.6* 31.7* 31.4*  MCV 92.2 93.0 94.3  PLT 126* 106* 109*    Basic Metabolic Panel: Recent Labs  Lab 02/24/18 0324 02/26/18 0342 02/27/18 0500 02/28/18 0340 03/01/18 0500  NA 141 139 139 137 140  K 3.4* 3.9 4.1 4.3 3.7  CL 98 101 97* 98 95*  CO2 35* 33* 31 24 32  GLUCOSE 108* 114* 109* 97 108*  BUN 22 24* 32* 36* 32*  CREATININE 1.58* 1.46* 1.86* 1.87* 1.85*  CALCIUM 8.7* 8.9 9.0 8.6* 8.8*    GFR: Estimated Creatinine Clearance: 37.8 mL/min (A) (by C-G formula based on SCr of 1.85 mg/dL (H)).  Liver Function Tests: Recent Labs  Lab 02/23/18 2228  AST 19  ALT 12  ALKPHOS 40  BILITOT 0.8  PROT 6.3*  ALBUMIN 3.7   No results for input(s): LIPASE, AMYLASE in the last 168 hours. No results for input(s): AMMONIA in the last 168 hours.  Coagulation Profile: No results for input(s): INR, PROTIME in the last 168 hours.  Cardiac Enzymes: No results for input(s): CKTOTAL, CKMB, CKMBINDEX, TROPONINI in the last 168 hours.  BNP (last 3 results) No results for input(s): PROBNP in the last 8760 hours.  HbA1C: No results for input(s): HGBA1C in the last 72 hours.  CBG: Recent Labs  Lab 02/28/18 1124 02/28/18 1623 02/28/18 2124 03/01/18 0644 03/01/18 1102   GLUCAP 102* 80 120* 97 151*    Lipid Profile: No results for input(s): CHOL, HDL, LDLCALC, TRIG, CHOLHDL, LDLDIRECT in the last 72 hours.  Thyroid Function Tests: No results for input(s): TSH, T4TOTAL, FREET4, T3FREE, THYROIDAB in the last 72 hours.  Anemia Panel: No results for input(s): VITAMINB12, FOLATE, FERRITIN, TIBC, IRON, RETICCTPCT in the last 72 hours.  Urine analysis:    Component Value Date/Time   COLORURINE YELLOW 02/24/2018 0003   APPEARANCEUR CLEAR 02/24/2018 0003   LABSPEC 1.006 02/24/2018 0003   PHURINE 5.0 02/24/2018 0003   GLUCOSEU NEGATIVE 02/24/2018 0003   HGBUR SMALL (A) 02/24/2018 0003   BILIRUBINUR NEGATIVE 02/24/2018 0003   KETONESUR NEGATIVE 02/24/2018 0003   PROTEINUR NEGATIVE 02/24/2018 0003   UROBILINOGEN 1.0 03/26/2012 0100   NITRITE NEGATIVE 02/24/2018 0003   LEUKOCYTESUR LARGE (A) 02/24/2018 0003    Sepsis Labs: Lactic Acid, Venous    Component Value Date/Time   LATICACIDVEN 1.9 04/03/2012  1839    MICROBIOLOGY: Recent Results (from the past 240 hour(s))  MRSA PCR Screening     Status: Abnormal   Collection Time: 02/23/18  9:16 PM  Result Value Ref Range Status   MRSA by PCR POSITIVE (A) NEGATIVE Final    Comment:        The GeneXpert MRSA Assay (FDA approved for NASAL specimens only), is one component of a comprehensive MRSA colonization surveillance program. It is not intended to diagnose MRSA infection nor to guide or monitor treatment for MRSA infections. RESULT CALLED TO, READ BACK BY AND VERIFIED WITHGearldine Shown RN 2353 02/24/18 A BROWNING Performed at [facility], [place] [address], [place], [place] [address]     RADIOLOGY STUDIES/RESULTS: Dg Chest Port 1 View  Result Date: 02/24/2018 CLINICAL DATA:  PICC line placement EXAM: PORTABLE CHEST 1 VIEW COMPARISON:  02/23/2018 FINDINGS: Right-sided PICC line tip projects over the lower SVC. There are median sternotomy wires and sequelae of CABG. Unchanged  cardiomegaly. Slightly increased central pulmonary vascular congestion and multifocal atelectasis of the right lung. IMPRESSION: Right-sided PICC line tip projects over the lower SVC. Electronically Signed   By: Ulyses Jarred M.D.   On: 02/24/2018 03:13   Dg Chest Port 1 View  Result Date: 02/23/2018 CLINICAL DATA:  Hypoxia EXAM: PORTABLE CHEST 1 VIEW COMPARISON:  04/03/2015 FINDINGS: Post sternotomy changes. Cardiomegaly with central vascular congestion. No pleural effusion. No pneumothorax. Tubing or lead over the right upper chest, if this is a vascular catheter takes a slightly unusual curved course distally. IMPRESSION: 1. Cardiomegaly with vascular congestion. 2. Catheter tubing or external lead over the right chest for which clinical correlation is recommended Electronically Signed   By: Donavan Foil M.D.   On: 02/23/2018 23:56   [place] Ekg Site Rite  Result Date: 02/24/2018 If Site Rite image not attached, placement could not be confirmed due to current cardiac rhythm.    LOS: 6 days   Oren Binet, MD  [facility]  If 7PM-7AM, please contact night-coverage  Please page via [email or web address]-Password [email or web address]-click on MD name and type text message  03/01/2018, 1:17 PM

## 2018-03-01 NOTE — Progress Notes (Signed)
Occupational Therapy Treatment Patient Details Name: Caitlyn Hardy MRN: [id number] DOB: 05/04/1945 Today's Date: 03/01/2018    History of present illness 72 y.o. female admitted on 02/23/18 for orthostatic hypotension, A-fib, and likely UTI.  Pt with significant PMH of thrombocytopenia, stroke, pulmonary HTN, PAF, MI, morbid obesity, HTN, DM, COPD, chronic systolic CHF, CAD, anemia, R shoulder surgery, R TKA, and CABG.     OT comments  Pt progressing toward OT goals- demonstrating improvement in ADL transfers and functional activity tolerance. Pt still demonstrating cognitive deficits that impact her self/safety awareness, requiring 24 hr supervision for safety. Continue to recommend SNF at d/c.    Follow Up Recommendations  SNF;Supervision/Assistance - 24 hour    Equipment Recommendations  None recommended by OT    Recommendations for Other Services PT consult;Speech consult    Precautions / Restrictions Precautions Precautions: Fall Precaution Comments: monitor HR and BP Restrictions Weight Bearing Restrictions: No       Mobility Bed Mobility               General bed mobility comments: received in chair  Transfers Overall transfer level: Needs assistance Equipment used: Rolling walker (2 wheeled) Transfers: Sit to/from [facility] Sit to Stand: Min assist Stand pivot transfers: Min assist       General transfer comment: Min A to power up from recliner, min A to min guard for standing level     Balance Overall balance assessment: Needs assistance Sitting-balance support: No upper extremity supported;Feet supported Sitting balance-Leahy Scale: Good     Standing balance support: Bilateral upper extremity supported Standing balance-Leahy Scale: Fair Standing balance comment: min A occasionally needed in standing                           ADL either performed or assessed with clinical judgement   ADL Overall ADL's : Needs  assistance/impaired     Grooming: Set up;Sitting;Supervision/safety                               Functional mobility during ADLs: Min guard;Rolling walker General ADL Comments: Min guard for safety d/t pt impulsiveness               Cognition Arousal/Alertness: Awake/alert Behavior During Therapy: Anxious;Restless Overall Cognitive Status: Impaired/Different from baseline Area of Impairment: Orientation;Safety/judgement;Awareness;Problem solving;Attention                 Orientation Level: Disoriented to;Situation Current Attention Level: Sustained Memory: Decreased short-term memory Following Commands: Follows one step commands consistently Safety/Judgement: Decreased awareness of safety;Decreased awareness of deficits Awareness: Intellectual Problem Solving: Requires verbal cues;Requires tactile cues General Comments: patient emotional and labile throughout session with some noted confusion. Tangential and max cues to redired                   Pertinent Vitals/ Pain       Pain Assessment: No/denies pain Faces Pain Scale: Hurts a little bit Pain Location: right shoulder blade and  low back Pain Descriptors / Indicators: Sore  Home Living   Prior Functioning/Environment    Frequency  Min 2X/week        Progress Toward Goals  OT Goals(current goals can now be found in the care plan section)  Progress towards OT goals: Progressing toward goals  Acute Rehab OT Goals Patient Stated Goal: to go home OT Goal Formulation: With patient Time For  Goal Achievement: 03/12/18 Potential to Achieve Goals: [place] Discharge plan remains appropriate       AM-PAC OT "6 Clicks" Daily Activity     Outcome Measure   Help from another person eating meals?: None Help from another person taking care of personal grooming?: A Little Help from another person toileting, which includes using toliet, bedpan, or urinal?: A Little Help from another person  bathing (including washing, rinsing, drying)?: A Little Help from another person to put on and taking off regular upper body clothing?: A Little Help from another person to put on and taking off regular lower body clothing?: A Little 6 Click Score: 19    End of Session Equipment Utilized During Treatment: Rolling walker  OT Visit Diagnosis: Unsteadiness on feet (R26.81);Other abnormalities of gait and mobility (R26.89);Muscle weakness (generalized) (M62.81)   Activity Tolerance Patient tolerated treatment well   Patient Left in chair;with call bell/phone within reach;with chair alarm set;with family/visitor present   Nurse Communication Mobility status        Time: 1419-1435 OT Time Calculation (min): 16 min  Charges: OT General Charges $OT Visit: 1 Visit OT Treatments $Self Care/Home Management : 8-22 mins   Curtis Sites OTR/L 03/01/2018, 2:43 PM

## 2018-03-02 ENCOUNTER — Inpatient Hospital Stay (HOSPITAL_COMMUNITY): Payer: 59

## 2018-03-02 LAB — BASIC METABOLIC PANEL
ANION GAP: 11 (ref 5–15)
BUN: 30 mg/dL — ABNORMAL HIGH (ref 8–23)
CALCIUM: 8.9 mg/dL (ref 8.9–10.3)
CO2: 33 mmol/L — ABNORMAL HIGH (ref 22–32)
CREATININE: 1.65 mg/dL — AB (ref 0.44–1.00)
Chloride: 96 mmol/L — ABNORMAL LOW (ref 98–111)
GFR calc Af Amer: 36 mL/min — ABNORMAL LOW (ref >60)
GFR calc non Af Amer: 31 mL/min — ABNORMAL LOW (ref >60)
Glucose, Bld: 119 mg/dL — ABNORMAL HIGH (ref 70–99)
Potassium: 3.8 mmol/L (ref 3.5–5.1)
Sodium: 140 mmol/L (ref 135–145)

## 2018-03-02 LAB — URINALYSIS, ROUTINE W REFLEX MICROSCOPIC
Bacteria, UA: NONE SEEN
Bilirubin Urine: NEGATIVE
Glucose, UA: NEGATIVE mg/dL
Hgb urine dipstick: NEGATIVE
Ketones, ur: NEGATIVE mg/dL
NITRITE: NEGATIVE
Protein, ur: NEGATIVE mg/dL
SPECIFIC GRAVITY, URINE: 1.011 (ref 1.005–1.030)
pH: 6 (ref 5.0–8.0)

## 2018-03-02 LAB — CBC
HCT: 35.3 % — ABNORMAL LOW (ref 36.0–46.0)
HEMOGLOBIN: 10.1 g/dL — AB (ref 12.0–15.0)
MCH: 26.6 pg (ref 26.0–34.0)
MCHC: 28.6 g/dL — ABNORMAL LOW (ref 30.0–36.0)
MCV: 92.9 fL (ref 80.0–100.0)
Platelets: 204 10*3/uL (ref 150–400)
RBC: 3.8 MIL/uL — ABNORMAL LOW (ref 3.87–5.11)
RDW: 16.5 % — ABNORMAL HIGH (ref 11.5–15.5)
WBC: 4.5 10*3/uL (ref 4.0–10.5)
nRBC: 0 % (ref 0.0–0.2)

## 2018-03-02 LAB — GLUCOSE, CAPILLARY
Glucose-Capillary: 109 mg/dL — ABNORMAL HIGH (ref 70–99)
Glucose-Capillary: 114 mg/dL — ABNORMAL HIGH (ref 70–99)
Glucose-Capillary: 91 mg/dL (ref 70–99)
Glucose-Capillary: 90 mg/dL (ref 70–99)

## 2018-03-02 LAB — VITAMIN B12: VITAMIN B 12: 266 pg/mL (ref 180–914)

## 2018-03-02 MED ORDER — GADOBUTROL 1 MMOL/ML IV SOLN
10.0000 mL | Freq: Once | INTRAVENOUS | Status: AC | PRN
  Administered 2018-03-02: 10 mL via INTRAVENOUS

## 2018-03-02 MED ORDER — CYANOCOBALAMIN 1000 MCG/ML IJ SOLN
1000.0000 ug | Freq: Every day | INTRAMUSCULAR | Status: DC
  Administered 2018-03-02 – 2018-03-05 (×4): 1000 ug via SUBCUTANEOUS
  Filled 2018-03-02 (×5): qty 1

## 2018-03-02 NOTE — Progress Notes (Signed)
Patient refused CPAP at this time.

## 2018-03-02 NOTE — Progress Notes (Addendum)
PROGRESS NOTE        PATIENT DETAILS Name: Caitlyn Hardy Age: 72 y.o. Sex: female Date of Birth: Oct 10, 1945 Admit Date: 02/23/2018 Admitting Physician Elwyn Reach, MD DEY:CXKGYJ, Lupita Dawn, MD  Brief Narrative: Patient is a 72 y.o. female with history of chronic systolic heart failure, COPD on 3-4 L of oxygen at home, CAD status post CABG, PAF, probable dementia at baseline-transferred from [facility] for evaluation of altered mental status, orthostatic hypotension and A. fib with RVR.  See below for further details  Subjective: Awake and alert this morning-Per nursing staff (and family over the phone yesterday) patient was much more confused and hallucinating at times.  Assessment/Plan: Altered mental status-likely delirium: Per prior notes-patient has a history of dementia and short-term memory issues-suspect this is delirium related to dementia.  Although mental status has overall improved-she continues to have a waxing and waning course mostly delirium at nighttime -on 12/2-she apparently started hallucinating-long discussion with family who requested further work-up as they felt this was beyond on her usual delirium.  UA was negative, CBC/chemistry panel within normal limits no indication of infection.  MRI brain on 12/2 right post frontal brain abnormality-Per radiology-this is indeterminate for a low/intermediate grade primary brain tumor versus a unusual manifestation of an old CVA-hence a repeat MRI with postcontrast images has been ordered (spoke with Dr. Rivka Spring to perform MRI with contrast even with current GFR).  Check vitamin B12, RPR and EEG to complete work-up.  A. fib with RVR: Heart rate currently do low 100s range this morning-however yesterday evening-as high as 130s.  Blood pressure soft-but have decreased Coreg to 12.5 mg twice daily.  Continue digoxin and albuterol.  Have discussed with cardiology dyspneic-they will evaluate and  recommend further options for rate control.  Asymptomatic bacteriuria: Asymptomatic-no indication for IV antibiotics.  Follow.    Orthostatic hypotension: Resolved.  Mild acute on chronic systolic heart failure (EF 25-30%): Volume status is stable-has required IV Lasix intermittently-continue Demadex.  CKD stage III: Creatinine remains close to usual baseline-follow electro lites periodically.  COPD with chronic hypoxic respiratory failure on home O2: No evidence of exacerbation-continue bronchodilators.    Hypothyroidism: Continue Synthroid  Hypertension: Controlled-continue Coreg and diuretics.  DM-2: CBG stable with SSI.  Depression: Continue Cymbalta, Neurontin and Requip.  OSA: CPAP nightly  DVT Prophylaxis: Full dose anticoagulation with Eliquis  Code Status: Full code   Family Communication: None at bedside-had spoken with patient's son over the phone.  Disposition Plan: Remain inpatient-SNF on discharge work-up is complete  Antimicrobial agents: Anti-infectives (From admission, onward)   Start     Dose/Rate Route Frequency Ordered Stop   02/24/18 0200  aztreonam (AZACTAM) 1 g in sodium chloride 0.9 % 100 mL IVPB  Status:  Discontinued     1 g 200 mL/hr over 30 Minutes Intravenous Every 8 hours 02/24/18 0100 02/24/18 1418      Procedures: None  CONSULTS:  cardiology  Time spent: 25 minutes-Greater than 50% of this time was spent in counseling, explanation of diagnosis, planning of further management, and coordination of care.  MEDICATIONS: Scheduled Meds: . amiodarone  200 mg Oral BID  . apixaban  5 mg Oral BID  . arformoterol  15 mcg Nebulization BID  . budesonide (PULMICORT) nebulizer solution  0.25 mg Nebulization BID  . carvedilol  12.5 mg Oral BID WC  .  digoxin  0.0625 mg Oral Daily  . DULoxetine  60 mg Oral QHS  . gabapentin  100 mg Oral TID  . insulin aspart  0-5 Units Subcutaneous QHS  . insulin aspart  0-9 Units Subcutaneous TID WC  .  levothyroxine  25 mcg Oral Q0600  . mouth rinse  15 mL Mouth Rinse BID  . rOPINIRole  1 mg Oral QHS  . torsemide  20 mg Oral Daily   Continuous Infusions: PRN Meds:.acetaminophen, ipratropium, levalbuterol, ondansetron (ZOFRAN) IV, sodium chloride flush, temazepam   PHYSICAL EXAM: Vital signs: Vitals:   03/02/18 0700 03/02/18 0816 03/02/18 0900 03/02/18 1033  BP:  (!) 113/91    Pulse:    (!) 111  Resp: (!) 28 (!) 21 (!) 31   Temp:  97.9 F (36.6 C)    TempSrc:  Oral    SpO2:      Weight:      Height:       Filed Weights   02/28/18 0600 03/01/18 0403 03/02/18 0405  Weight: 128.1 kg 126.9 kg 129.3 kg   Body mass index is 45.32 kg/m.   General appearance:Awake, alert, not in any distress.  Chronically sick appearing. Eyes:no scleral icterus. HEENT: Atraumatic and Normocephalic Neck: supple, no JVD. Resp:Good air entry bilaterally,no rales or rhonchi CVS: S1 S2 irregular, no murmurs.  GI: Bowel sounds present, Non tender and not distended with no gaurding, rigidity or rebound. Extremities: B/L Lower Ext shows trace edema, both legs are warm to touch Neurology:  Non focal Psychiatric: Normal judgment and insight. Normal mood. Musculoskeletal:No digital cyanosis Skin:No Rash, warm and dry Wounds:N/A  I have personally reviewed following labs and imaging studies  LABORATORY DATA: CBC: Recent Labs  Lab 02/23/18 2228 02/24/18 0324 02/26/18 0342 03/02/18 0400  WBC 4.6 3.8* 4.4 4.5  NEUTROABS 2.6  --   --   --   HGB 10.2* 9.2* 9.0* 10.1*  HCT 35.6* 31.7* 31.4* 35.3*  MCV 92.2 93.0 94.3 92.9  PLT 126* 106* 109* 099    Basic Metabolic Panel: Recent Labs  Lab 02/26/18 0342 02/27/18 0500 02/28/18 0340 03/01/18 0500 03/02/18 0400  NA 139 139 137 140 140  K 3.9 4.1 4.3 3.7 3.8  CL 101 97* 98 95* 96*  CO2 33* 31 24 32 33*  GLUCOSE 114* 109* 97 108* 119*  BUN 24* 32* 36* 32* 30*  CREATININE 1.46* 1.86* 1.87* 1.85* 1.65*  CALCIUM 8.9 9.0 8.6* 8.8* 8.9     GFR: Estimated Creatinine Clearance: 42.8 mL/min (A) (by C-G formula based on SCr of 1.65 mg/dL (H)).  Liver Function Tests: Recent Labs  Lab 02/23/18 2228  AST 19  ALT 12  ALKPHOS 40  BILITOT 0.8  PROT 6.3*  ALBUMIN 3.7   No results for input(s): LIPASE, AMYLASE in the last 168 hours. No results for input(s): AMMONIA in the last 168 hours.  Coagulation Profile: No results for input(s): INR, PROTIME in the last 168 hours.  Cardiac Enzymes: No results for input(s): CKTOTAL, CKMB, CKMBINDEX, TROPONINI in the last 168 hours.  BNP (last 3 results) No results for input(s): PROBNP in the last 8760 hours.  HbA1C: No results for input(s): HGBA1C in the last 72 hours.  CBG: Recent Labs  Lab 03/01/18 0644 03/01/18 1102 03/01/18 1612 03/01/18 2140 03/02/18 0604  GLUCAP 97 151* 102* 97 109*    Lipid Profile: No results for input(s): CHOL, HDL, LDLCALC, TRIG, CHOLHDL, LDLDIRECT in the last 72 hours.  Thyroid Function Tests: No results  for input(s): TSH, T4TOTAL, FREET4, T3FREE, THYROIDAB in the last 72 hours.  Anemia Panel: No results for input(s): VITAMINB12, FOLATE, FERRITIN, TIBC, IRON, RETICCTPCT in the last 72 hours.  Urine analysis:    Component Value Date/Time   COLORURINE YELLOW 02/24/2018 0003   APPEARANCEUR CLEAR 02/24/2018 0003   LABSPEC 1.006 02/24/2018 0003   PHURINE 5.0 02/24/2018 0003   GLUCOSEU NEGATIVE 02/24/2018 0003   HGBUR SMALL (A) 02/24/2018 0003   BILIRUBINUR NEGATIVE 02/24/2018 0003   KETONESUR NEGATIVE 02/24/2018 0003   PROTEINUR NEGATIVE 02/24/2018 0003   UROBILINOGEN 1.0 03/26/2012 0100   NITRITE NEGATIVE 02/24/2018 0003   LEUKOCYTESUR LARGE (A) 02/24/2018 0003    Sepsis Labs: Lactic Acid, Venous    Component Value Date/Time   LATICACIDVEN 1.9 04/03/2012 1839    MICROBIOLOGY: Recent Results (from the past 240 hour(s))  MRSA PCR Screening     Status: Abnormal   Collection Time: 02/23/18  9:16 PM  Result Value Ref Range  Status   MRSA by PCR POSITIVE (A) NEGATIVE Final    Comment:        The GeneXpert MRSA Assay (FDA approved for NASAL specimens only), is one component of a comprehensive MRSA colonization surveillance program. It is not intended to diagnose MRSA infection nor to guide or monitor treatment for MRSA infections. RESULT CALLED TO, READ BACK BY AND VERIFIED WITHGearldine Shown RN [id number] 02/24/18 A BROWNING Performed at [facility], [place] [address], [place], [place] [address]     RADIOLOGY STUDIES/RESULTS: Dg Skull 1-3 Views  Result Date: 03/02/2018 CLINICAL DATA:  MRI screening. EXAM: SKULL - 1-3 VIEW COMPARISON:  CT 04/14/2012. FINDINGS: No metallic foreign bodies are noted. Small lucencies are noted throughout the skull. Process such as myeloma or metastatic disease cannot be excluded. IMPRESSION: 1. No metallic marker foreign bodies noted. Patient cleared for MRI. 2. Small lucencies noted throughout the skull. A process such as myeloma or metastatic disease cannot be excluded. Electronically Signed   By: Marcello Moores  Register   On: 03/02/2018 09:39   Mr Brain Wo Contrast  Result Date: 03/02/2018 CLINICAL DATA:  Dementia. Worsened mental status recently. History of heart failure. EXAM: MRI HEAD WITHOUT CONTRAST TECHNIQUE: Multiplanar, multiecho pulse sequences of the brain and surrounding structures were obtained without intravenous contrast. COMPARISON:  Head CT 04/14/2012.  Brain MRI 04/05/2012. FINDINGS: Brain: Diffusion imaging does not show any acute or subacute infarction. The brainstem is normal. There are numerous old small vessel cerebellar infarctions which were acute in 2014. Left hemisphere shows what appears to be an old cortical infarction in the lateral surface of the left temporal lobe, similar to the study of 2014. In the right posterior frontal brain, there is a brain abnormality containing calcification and or blood products, with surrounding vasogenic edema pattern versus is a  chronic gliosis pattern. The region in total measures up to 5 cm in diameter. The more central component measures about 2.5 cm in diameter. No significant mass effect or shift. The question is of this represents a low to intermediate grade primary brain tumor or an unusual old infarction. I would recommend post-contrast imaging to help is characterize this further. Vascular: Major vessels at the base of the brain show flow. Skull and upper cervical spine: Negative Sinuses/Orbits: Mucosal inflammatory changes of the left division of the sphenoid sinus. Orbits negative. Other: None IMPRESSION: No acute brain infarction. Old small vessel cerebellar infarctions. Old infarction in the lateral left temporal lobe. Right posterior frontal brain abnormality which  is indeterminate for low to intermediate grade primary brain tumor versus an unusual manifestation of old infarction. I would recommend post-contrast imaging as a next step to help [place] characterize this further. These results will be called to the ordering clinician or representative by the Radiologist Assistant, and communication documented in the PACS or zVision Dashboard. Electronically Signed   By: Nelson Chimes M.D.   On: 03/02/2018 10:56   Dg Chest Port 1 View  Result Date: 02/24/2018 CLINICAL DATA:  PICC line placement EXAM: PORTABLE CHEST 1 VIEW COMPARISON:  02/23/2018 FINDINGS: Right-sided PICC line tip projects over the lower SVC. There are median sternotomy wires and sequelae of CABG. Unchanged cardiomegaly. Slightly increased central pulmonary vascular congestion and multifocal atelectasis of the right lung. IMPRESSION: Right-sided PICC line tip projects over the lower SVC. Electronically Signed   By: Ulyses Jarred M.D.   On: 02/24/2018 03:13   Dg Chest Port 1 View  Result Date: 02/23/2018 CLINICAL DATA:  Hypoxia EXAM: PORTABLE CHEST 1 VIEW COMPARISON:  04/03/2015 FINDINGS: Post sternotomy changes. Cardiomegaly with central vascular congestion.  No pleural effusion. No pneumothorax. Tubing or lead over the right upper chest, if this is a vascular catheter takes a slightly unusual curved course distally. IMPRESSION: 1. Cardiomegaly with vascular congestion. 2. Catheter tubing or external lead over the right chest for which clinical correlation is recommended Electronically Signed   By: Donavan Foil M.D.   On: 02/23/2018 23:56   [place] Ekg Site Rite  Result Date: 02/24/2018 If Site Rite image not attached, placement could not be confirmed due to current cardiac rhythm.    LOS: 7 days   Oren Binet, MD  [facility]  If 7PM-7AM, please contact night-coverage  Please page via [email or web address]-Password [email or web address]-click on MD name and type text message  03/02/2018, 11:18 AM

## 2018-03-02 NOTE — Progress Notes (Signed)
Dr. Sloan Leiter paged about MRI results

## 2018-03-02 NOTE — Progress Notes (Signed)
ELECTROENCEPHALOGRAM REPORT Date of Study: 03/02/18  MRN: [id number]   Clinical History:  72 y.o. female with history of chronic systolic heart failure, COPD on 3-4 L of oxygen at home, CAD status post CABG, PAF, probable dementia at baseline-transferred from [facility] for evaluation of altered mental status, orthostatic hypotension and A. fib with RVR MEDS: No AEDs   Technical Summary:  A multichannel digital EEG recording measured by the international 10-20 system with electrodes applied with paste and impedances below 5000 ohms performed in our laboratory with EKG monitoring in an awake and asleep patient. Hyperventilation and Photic stimulation were not performed. The digital EEG was referentially recorded, reformatted, and digitally filtered in a variety of bipolar and referential montages for optimal display.  Description:  The patient is awake and asleep during the recording. During maximal wakefulness, there is a symmetric, medium voltage 10 Hz posterior dominant rhythm that attenuates with eye opening. The record is symmetric. During drowsiness and sleep, there is an increase in theta slowing of the background. Vertex waves and symmetric sleep spindles were seen. At times EEG shows focal R frontal slowing.  There were no electrographic seizures seen.  EKG lead was unremarkable  Impression: This awake and asleep EEG is abnormal due to Focal R frontal slowing but no electrographic seizures noted.

## 2018-03-02 NOTE — Progress Notes (Signed)
EEG completed, results pending. 

## 2018-03-02 NOTE — Progress Notes (Signed)
Progress Note  Patient Name: Caitlyn Hardy Date of Encounter: 03/02/2018  Primary Cardiologist: Kate Sable, MD   Subjective   Still intermittently confused, answering questions from her son with inappropriate answers.  Inpatient Medications    Scheduled Meds: . amiodarone  200 mg Oral BID  . apixaban  5 mg Oral BID  . arformoterol  15 mcg Nebulization BID  . budesonide (PULMICORT) nebulizer solution  0.25 mg Nebulization BID  . carvedilol  12.5 mg Oral BID WC  . digoxin  0.0625 mg Oral Daily  . DULoxetine  60 mg Oral QHS  . gabapentin  100 mg Oral TID  . insulin aspart  0-5 Units Subcutaneous QHS  . insulin aspart  0-9 Units Subcutaneous TID WC  . levothyroxine  25 mcg Oral Q0600  . mouth rinse  15 mL Mouth Rinse BID  . rOPINIRole  1 mg Oral QHS  . torsemide  20 mg Oral Daily   Continuous Infusions:  PRN Meds: acetaminophen, ipratropium, levalbuterol, ondansetron (ZOFRAN) IV, sodium chloride flush, temazepam   Vital Signs    Vitals:   03/02/18 0900 03/02/18 1033 03/02/18 1100 03/02/18 1300  BP:      Pulse:  (!) 111    Resp: (!) 31  (!) 32 (!) 25  Temp:      TempSrc:      SpO2:      Weight:      Height:        Intake/Output Summary (Last 24 hours) at 03/02/2018 1353 Last data filed at 03/02/2018 1300 Gross per 24 hour  Intake 480 ml  Output 1050 ml  Net -570 ml   Filed Weights   02/28/18 0600 03/01/18 0403 03/02/18 0405  Weight: 128.1 kg 126.9 kg 129.3 kg    Telemetry    afib with rates between 75-120 - Personally Reviewed  ECG    No new  Physical Exam   GEN: No acute distress.   Neck: No JVD Cardiac: irregular rhythm, normal rate, no murmurs, rubs, or gallops.  Respiratory: Clear to auscultation bilaterally. GI: Soft, nontender, non-distended  MS: No edema; No deformity. Neuro:  Nonfocal, oriented to person, place. Not oriented to time.  Psych: Normal affect, confused. Does not answer questions appropriately.  Labs     Chemistry Recent Labs  Lab 02/23/18 2228  02/28/18 0340 03/01/18 0500 03/02/18 0400  NA 141   < > 137 140 140  K 3.7   < > 4.3 3.7 3.8  CL 99   < > 98 95* 96*  CO2 33*   < > 24 32 33*  GLUCOSE 95   < > 97 108* 119*  BUN 24*   < > 36* 32* 30*  CREATININE 1.61*   < > 1.87* 1.85* 1.65*  CALCIUM 9.0   < > 8.6* 8.8* 8.9  PROT 6.3*  --   --   --   --   ALBUMIN 3.7  --   --   --   --   AST 19  --   --   --   --   ALT 12  --   --   --   --   ALKPHOS 40  --   --   --   --   BILITOT 0.8  --   --   --   --   GFRNONAA 32*   < > 26* 27* 31*  GFRAA 37*   < > 31* 31* 36*  ANIONGAP 9   < >  15 13 11    < > = values in this interval not displayed.     Hematology Recent Labs  Lab 02/24/18 0324 02/26/18 0342 03/02/18 0400  WBC 3.8* 4.4 4.5  RBC 3.41* 3.33* 3.80*  HGB 9.2* 9.0* 10.1*  HCT 31.7* 31.4* 35.3*  MCV 93.0 94.3 92.9  MCH 27.0 27.0 26.6  MCHC 29.0* 28.7* 28.6*  RDW 15.9* 16.3* 16.5*  PLT 106* 109* 204    Cardiac EnzymesNo results for input(s): TROPONINI in the last 168 hours. No results for input(s): TROPIPOC in the last 168 hours.   BNPNo results for input(s): BNP, PROBNP in the last 168 hours.   DDimer No results for input(s): DDIMER in the last 168 hours.   Radiology    Dg Skull 1-3 Views  Result Date: 03/02/2018 CLINICAL DATA:  MRI screening. EXAM: SKULL - 1-3 VIEW COMPARISON:  CT 04/14/2012. FINDINGS: No metallic foreign bodies are noted. Small lucencies are noted throughout the skull. Process such as myeloma or metastatic disease cannot be excluded. IMPRESSION: 1. No metallic marker foreign bodies noted. Patient cleared for MRI. 2. Small lucencies noted throughout the skull. A process such as myeloma or metastatic disease cannot be excluded. Electronically Signed   By: Marcello Moores  Register   On: 03/02/2018 09:39   Mr Brain Wo Contrast  Result Date: 03/02/2018 CLINICAL DATA:  Dementia. Worsened mental status recently. History of heart failure. EXAM: MRI HEAD WITHOUT  CONTRAST TECHNIQUE: Multiplanar, multiecho pulse sequences of the brain and surrounding structures were obtained without intravenous contrast. COMPARISON:  Head CT 04/14/2012.  Brain MRI 04/05/2012. FINDINGS: Brain: Diffusion imaging does not show any acute or subacute infarction. The brainstem is normal. There are numerous old small vessel cerebellar infarctions which were acute in 2014. Left hemisphere shows what appears to be an old cortical infarction in the lateral surface of the left temporal lobe, similar to the study of 2014. In the right posterior frontal brain, there is a brain abnormality containing calcification and or blood products, with surrounding vasogenic edema pattern versus is a chronic gliosis pattern. The region in total measures up to 5 cm in diameter. The more central component measures about 2.5 cm in diameter. No significant mass effect or shift. The question is of this represents a low to intermediate grade primary brain tumor or an unusual old infarction. I would recommend post-contrast imaging to help is characterize this further. Vascular: Major vessels at the base of the brain show flow. Skull and upper cervical spine: Negative Sinuses/Orbits: Mucosal inflammatory changes of the left division of the sphenoid sinus. Orbits negative. Other: None IMPRESSION: No acute brain infarction. Old small vessel cerebellar infarctions. Old infarction in the lateral left temporal lobe. Right posterior frontal brain abnormality which is indeterminate for low to intermediate grade primary brain tumor versus an unusual manifestation of old infarction. I would recommend post-contrast imaging as a next step to help [place] characterize this further. These results will be called to the ordering clinician or representative by the Radiologist Assistant, and communication documented in the PACS or zVision Dashboard. Electronically Signed   By: Nelson Chimes M.D.   On: 03/02/2018 10:56    Cardiac Studies   No  new  Patient Profile     72 y.o.femalewithCAD s/p CABG in 2001 with subsequent PCI of LCx in 5053, chronic systolic CHF (EF 97-67% on last echo 12/2017), persistentatrial fibrillation, DM type 2, COPD on home O2 2-3L, OSA on CPAP, CVA, CKD stage 3,andmorbid obesity,who is being seen for  atrial fibrillation with RVR.  Assessment & Plan   Principal Problem:   Orthostatic hypotension Active Problems:   Morbid obesity (HCC)   Thrombocytopenia (HCC)   Paroxysmal atrial fibrillation (HCC)   Sleep apnea   A-fib (HCC)   UTI (urinary tract infection)   Metabolic encephalopathy   (HFpEF) heart failure with preserved ejection fraction ([place])    Challenging situation with low blood pressure and faster rates. Fortunately she seems asymptomatic currently.   Questions have been raised about cardioversion. With Moderate LA enlargement, previous cardioversion, and current medical condition, I do not think cardioversion will be a definitive solution. It is unclear if she has had medication noncompliance with anticoagulation, and TEE guided cardioversion would not be recommended given current mental status and confusion.   She is currently reasonably well rate controlled and asymptomatic, however if she has an elevated rates and hemodynamic compromise, consider bolus dose of amiodarone 150 mg IV.   I will involve my colleagues in electrophysiology to ensure we have the best option for rate control going forward.   For questions or updates, please contact [place] Please consult [email or web address] for contact info under        Signed, Elouise Munroe, MD  03/02/2018, 1:53 PM

## 2018-03-03 DIAGNOSIS — R410 Disorientation, unspecified: Secondary | ICD-10-CM

## 2018-03-03 DIAGNOSIS — I4819 Other persistent atrial fibrillation: Principal | ICD-10-CM

## 2018-03-03 LAB — GLUCOSE, CAPILLARY
Glucose-Capillary: 100 mg/dL — ABNORMAL HIGH (ref 70–99)
Glucose-Capillary: 107 mg/dL — ABNORMAL HIGH (ref 70–99)
Glucose-Capillary: 98 mg/dL (ref 70–99)
Glucose-Capillary: 91 mg/dL (ref 70–99)

## 2018-03-03 LAB — BASIC METABOLIC PANEL
ANION GAP: 11 (ref 5–15)
BUN: 23 mg/dL (ref 8–23)
CALCIUM: 8.6 mg/dL — AB (ref 8.9–10.3)
CO2: 36 mmol/L — ABNORMAL HIGH (ref 22–32)
Chloride: 96 mmol/L — ABNORMAL LOW (ref 98–111)
Creatinine, Ser: 1.48 mg/dL — ABNORMAL HIGH (ref 0.44–1.00)
GFR calc Af Amer: 41 mL/min — ABNORMAL LOW (ref >60)
GFR calc non Af Amer: 35 mL/min — ABNORMAL LOW (ref >60)
Glucose, Bld: 107 mg/dL — ABNORMAL HIGH (ref 70–99)
Potassium: 3.6 mmol/L (ref 3.5–5.1)
SODIUM: 143 mmol/L (ref 135–145)

## 2018-03-03 MED ORDER — METOPROLOL SUCCINATE ER 25 MG PO TB24
25.0000 mg | ORAL_TABLET | Freq: Two times a day (BID) | ORAL | Status: DC
  Administered 2018-03-03 – 2018-03-04 (×3): 25 mg via ORAL
  Filled 2018-03-03 (×4): qty 1

## 2018-03-03 MED ORDER — CIPROFLOXACIN HCL 500 MG PO TABS
250.0000 mg | ORAL_TABLET | Freq: Two times a day (BID) | ORAL | Status: DC
  Administered 2018-03-03 – 2018-03-05 (×5): 250 mg via ORAL
  Filled 2018-03-03 (×4): qty 1

## 2018-03-03 MED ORDER — CIPROFLOXACIN HCL 500 MG PO TABS
500.0000 mg | ORAL_TABLET | Freq: Two times a day (BID) | ORAL | Status: DC
  Filled 2018-03-03: qty 1

## 2018-03-03 MED ORDER — POTASSIUM CHLORIDE CRYS ER 20 MEQ PO TBCR
40.0000 meq | EXTENDED_RELEASE_TABLET | Freq: Once | ORAL | Status: AC
  Administered 2018-03-03: 40 meq via ORAL
  Filled 2018-03-03: qty 2

## 2018-03-03 MED ORDER — QUETIAPINE FUMARATE 25 MG PO TABS
25.0000 mg | ORAL_TABLET | Freq: Every day | ORAL | Status: DC
  Administered 2018-03-03: 25 mg via ORAL
  Filled 2018-03-03: qty 1

## 2018-03-03 NOTE — Progress Notes (Signed)
PROGRESS NOTE        PATIENT DETAILS Name: Caitlyn Hardy Age: 72 y.o. Sex: female Date of Birth: 04-05-1945 Admit Date: 02/23/2018 Admitting Physician Elwyn Reach, MD JHE:RDEYCX, Lupita Dawn, MD  Brief Narrative: Patient is a 73 y.o. female with history of chronic systolic heart failure, COPD on 3-4 L of oxygen at home, CAD status post CABG, PAF, probable dementia at baseline-transferred from [facility] for evaluation of altered mental status, orthostatic hypotension and A. fib with RVR.  See below for further details  Subjective: Patient remains confused, significant delirium.  Assessment/Plan:  Altered mental status-likely delirium:  -Patient with some baseline mild dementia, but apparently she has been been having significant hospital delirium, as well she has mild UTI contributing to it, her MRI brain with without contrast, significant for old CVA, B12 on the lower side as well, which we are repleting .EEG showing some encephalopathic, but no evidence of seizures . -We will start on low-dose Seroquel at night for delirium.  A. fib with RVR: Heart rate currently do low 100s range this morning-however yesterday evening-as high as 130s.  Blood pressure soft-but have decreased Coreg to 12.5 mg twice daily.  Continue digoxin and albuterol.  Electrophysiology to evaluate for further recommendation.  Orthostatic hypotension: Resolved.  Mild acute on chronic systolic heart failure (EF 25-30%): Volume status is stable-has required IV Lasix intermittently-continue Demadex.  CKD stage III: Creatinine remains close to usual baseline-follow electro lites periodically.  COPD with chronic hypoxic respiratory failure on home O2: No evidence of exacerbation-continue bronchodilators.    Hypothyroidism: Continue Synthroid  Hypertension: Controlled-continue Coreg and diuretics.  DM-2: CBG stable with SSI.  Depression: Continue Cymbalta, Neurontin and Requip.  OSA:  CPAP nightly  DVT Prophylaxis: Full dose anticoagulation with Eliquis  Code Status: Full code   Family Communication: Discussed with son at bedside  Disposition Plan: Remain inpatient-SNF on discharge work-up is complete  Antimicrobial agents: Anti-infectives (From admission, onward)   Start     Dose/Rate Route Frequency Ordered Stop   03/03/18 1100  ciprofloxacin (CIPRO) tablet 250 mg     250 mg Oral 2 times daily 03/03/18 1052 03/06/18 0759   03/03/18 0800  ciprofloxacin (CIPRO) tablet 500 mg  Status:  Discontinued     500 mg Oral 2 times daily 03/03/18 0647 03/03/18 1052   02/24/18 0200  aztreonam (AZACTAM) 1 g in sodium chloride 0.9 % 100 mL IVPB  Status:  Discontinued     1 g 200 mL/hr over 30 Minutes Intravenous Every 8 hours 02/24/18 0100 02/24/18 1418      Procedures: None  CONSULTS:  cardiology  Time spent: 25 minutes-Greater than 50% of this time was spent in counseling, explanation of diagnosis, planning of further management, and coordination of care.  MEDICATIONS: Scheduled Meds: . amiodarone  200 mg Oral BID  . apixaban  5 mg Oral BID  . arformoterol  15 mcg Nebulization BID  . budesonide (PULMICORT) nebulizer solution  0.25 mg Nebulization BID  . carvedilol  12.5 mg Oral BID WC  . ciprofloxacin  250 mg Oral BID  . cyanocobalamin  1,000 mcg Subcutaneous Daily  . digoxin  0.0625 mg Oral Daily  . DULoxetine  60 mg Oral QHS  . gabapentin  100 mg Oral TID  . insulin aspart  0-5 Units Subcutaneous QHS  . insulin aspart  0-9 Units  Subcutaneous TID WC  . levothyroxine  25 mcg Oral Q0600  . mouth rinse  15 mL Mouth Rinse BID  . potassium chloride  40 mEq Oral Once  . QUEtiapine  25 mg Oral QHS  . rOPINIRole  1 mg Oral QHS  . torsemide  20 mg Oral Daily   Continuous Infusions: PRN Meds:.acetaminophen, ipratropium, levalbuterol, ondansetron (ZOFRAN) IV, sodium chloride flush, temazepam   PHYSICAL EXAM: Vital signs: Vitals:   03/02/18 2034 03/03/18  0608 03/03/18 1137 03/03/18 1230  BP:  116/85  (!) 125/57  Pulse:  95 (!) 110 94  Resp:  (!) 24  17  Temp:  97.6 F (36.4 C)  98.5 F (36.9 C)  TempSrc:  Axillary  Oral  SpO2: 96% 97%  100%  Weight:  126.1 kg    Height:       Filed Weights   03/01/18 0403 03/02/18 0405 03/03/18 0608  Weight: 126.9 kg 129.3 kg 126.1 kg   Body mass index is 44.2 kg/m.   Awake Alert, doing in recliner, confused even though she is alert x2  symmetrical Chest wall movement, Good air movement bilaterally, CTAB Irregular irregular, tachycardic,No Gallops,Rubs or new Murmurs, No Parasternal Heave +ve B.Sounds, Abd Soft, No tenderness, No rebound - guarding or rigidity. No Cyanosis, Clubbing, has trace pedal edema, No new Rash or bruise     I have personally reviewed following labs and imaging studies  LABORATORY DATA: CBC: Recent Labs  Lab 02/26/18 0342 03/02/18 0400  WBC 4.4 4.5  HGB 9.0* 10.1*  HCT 31.4* 35.3*  MCV 94.3 92.9  PLT 109* 098    Basic Metabolic Panel: Recent Labs  Lab 02/27/18 0500 02/28/18 0340 03/01/18 0500 03/02/18 0400 03/03/18 0500  NA 139 137 140 140 143  K 4.1 4.3 3.7 3.8 3.6  CL 97* 98 95* 96* 96*  CO2 31 24 32 33* 36*  GLUCOSE 109* 97 108* 119* 107*  BUN 32* 36* 32* 30* 23  CREATININE 1.86* 1.87* 1.85* 1.65* 1.48*  CALCIUM 9.0 8.6* 8.8* 8.9 8.6*    GFR: Estimated Creatinine Clearance: 47 mL/min (A) (by C-G formula based on SCr of 1.48 mg/dL (H)).  Liver Function Tests: No results for input(s): AST, ALT, ALKPHOS, BILITOT, PROT, ALBUMIN in the last 168 hours. No results for input(s): LIPASE, AMYLASE in the last 168 hours. No results for input(s): AMMONIA in the last 168 hours.  Coagulation Profile: No results for input(s): INR, PROTIME in the last 168 hours.  Cardiac Enzymes: No results for input(s): CKTOTAL, CKMB, CKMBINDEX, TROPONINI in the last 168 hours.  BNP (last 3 results) No results for input(s): PROBNP in the last 8760  hours.  HbA1C: No results for input(s): HGBA1C in the last 72 hours.  CBG: Recent Labs  Lab 03/02/18 1126 03/02/18 1647 03/02/18 2148 03/03/18 0613 03/03/18 1115  GLUCAP 114* 91 90 100* 107*    Lipid Profile: No results for input(s): CHOL, HDL, LDLCALC, TRIG, CHOLHDL, LDLDIRECT in the last 72 hours.  Thyroid Function Tests: No results for input(s): TSH, T4TOTAL, FREET4, T3FREE, THYROIDAB in the last 72 hours.  Anemia Panel: Recent Labs    03/02/18 1223  VITAMINB12 266    Urine analysis:    Component Value Date/Time   COLORURINE YELLOW 03/02/2018 [place] 03/02/2018 1308   LABSPEC 1.011 03/02/2018 1308   PHURINE 6.0 03/02/2018 1308   GLUCOSEU NEGATIVE 03/02/2018 1308   HGBUR NEGATIVE 03/02/2018 1308   BILIRUBINUR NEGATIVE 03/02/2018 1308   KETONESUR  NEGATIVE 03/02/2018 1308   PROTEINUR NEGATIVE 03/02/2018 1308   UROBILINOGEN 1.0 03/26/2012 0100   NITRITE NEGATIVE 03/02/2018 1308   LEUKOCYTESUR SMALL (A) 03/02/2018 1308    Sepsis Labs: Lactic Acid, Venous    Component Value Date/Time   LATICACIDVEN 1.9 04/03/2012 1839    MICROBIOLOGY: Recent Results (from the past 240 hour(s))  MRSA PCR Screening     Status: Abnormal   Collection Time: 02/23/18  9:16 PM  Result Value Ref Range Status   MRSA by PCR POSITIVE (A) NEGATIVE Final    Comment:        The GeneXpert MRSA Assay (FDA approved for NASAL specimens only), is one component of a comprehensive MRSA colonization surveillance program. It is not intended to diagnose MRSA infection nor to guide or monitor treatment for MRSA infections. RESULT CALLED TO, READ BACK BY AND VERIFIED WITHGearldine Shown RN [id number] 02/24/18 A BROWNING Performed at [facility], [place] [address], [place], [place] [address]     RADIOLOGY STUDIES/RESULTS: Dg Skull 1-3 Views  Result Date: 03/02/2018 CLINICAL DATA:  MRI screening. EXAM: SKULL - 1-3 VIEW COMPARISON:  CT 04/14/2012. FINDINGS: No metallic  foreign bodies are noted. Small lucencies are noted throughout the skull. Process such as myeloma or metastatic disease cannot be excluded. IMPRESSION: 1. No metallic marker foreign bodies noted. Patient cleared for MRI. 2. Small lucencies noted throughout the skull. A process such as myeloma or metastatic disease cannot be excluded. Electronically Signed   By: Marcello Moores  Register   On: 03/02/2018 09:39   Mr Brain Wo Contrast  Result Date: 03/02/2018 CLINICAL DATA:  Dementia. Worsened mental status recently. History of heart failure. EXAM: MRI HEAD WITHOUT CONTRAST TECHNIQUE: Multiplanar, multiecho pulse sequences of the brain and surrounding structures were obtained without intravenous contrast. COMPARISON:  Head CT 04/14/2012.  Brain MRI 04/05/2012. FINDINGS: Brain: Diffusion imaging does not show any acute or subacute infarction. The brainstem is normal. There are numerous old small vessel cerebellar infarctions which were acute in 2014. Left hemisphere shows what appears to be an old cortical infarction in the lateral surface of the left temporal lobe, similar to the study of 2014. In the right posterior frontal brain, there is a brain abnormality containing calcification and or blood products, with surrounding vasogenic edema pattern versus is a chronic gliosis pattern. The region in total measures up to 5 cm in diameter. The more central component measures about 2.5 cm in diameter. No significant mass effect or shift. The question is of this represents a low to intermediate grade primary brain tumor or an unusual old infarction. I would recommend post-contrast imaging to help is characterize this further. Vascular: Major vessels at the base of the brain show flow. Skull and upper cervical spine: Negative Sinuses/Orbits: Mucosal inflammatory changes of the left division of the sphenoid sinus. Orbits negative. Other: None IMPRESSION: No acute brain infarction. Old small vessel cerebellar infarctions. Old  infarction in the lateral left temporal lobe. Right posterior frontal brain abnormality which is indeterminate for low to intermediate grade primary brain tumor versus an unusual manifestation of old infarction. I would recommend post-contrast imaging as a next step to help [place] characterize this further. These results will be called to the ordering clinician or representative by the Radiologist Assistant, and communication documented in the PACS or zVision Dashboard. Electronically Signed   By: Nelson Chimes M.D.   On: 03/02/2018 10:56   Mr Brain W Contrast  Result Date: 03/02/2018 CLINICAL DATA:  Initial evaluation  for EXAM: MRI HEAD WITH CONTRAST TECHNIQUE: Multiplanar, multiecho pulse sequences of the brain and surrounding structures were obtained with intravenous contrast. CONTRAST:  10 cc of Gadavist. COMPARISON:  Prior noncontrast brain MRI from earlier the same day. FINDINGS: Postcontrast imaging demonstrates no associated enhancement about the previously identified posterior right frontal signal changes. Given this, finding favored to reflect sequelae of chronic ischemia. No other abnormal enhancement elsewhere within the brain. Remainder of the examination is unchanged. IMPRESSION: 1. 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 A short interval follow-up study in 3 months is suggested to document stability. 2. No other abnormal enhancement elsewhere within the brain. Electronically Signed   By: Jeannine Boga M.D.   On: 03/02/2018 20:27   Dg Chest Port 1 View  Result Date: 02/24/2018 CLINICAL DATA:  PICC line placement EXAM: PORTABLE CHEST 1 VIEW COMPARISON:  02/23/2018 FINDINGS: Right-sided PICC line tip projects over the lower SVC. There are median sternotomy wires and sequelae of CABG. Unchanged cardiomegaly. Slightly increased central pulmonary vascular congestion and multifocal atelectasis of the right  lung. IMPRESSION: Right-sided PICC line tip projects over the lower SVC. Electronically Signed   By: Ulyses Jarred M.D.   On: 02/24/2018 03:13   Dg Chest Port 1 View  Result Date: 02/23/2018 CLINICAL DATA:  Hypoxia EXAM: PORTABLE CHEST 1 VIEW COMPARISON:  04/03/2015 FINDINGS: Post sternotomy changes. Cardiomegaly with central vascular congestion. No pleural effusion. No pneumothorax. Tubing or lead over the right upper chest, if this is a vascular catheter takes a slightly unusual curved course distally. IMPRESSION: 1. Cardiomegaly with vascular congestion. 2. Catheter tubing or external lead over the right chest for which clinical correlation is recommended Electronically Signed   By: Donavan Foil M.D.   On: 02/23/2018 23:56   [place] Ekg Site Rite  Result Date: 02/24/2018 If Site Rite image not attached, placement could not be confirmed due to current cardiac rhythm.    LOS: 8 days   Phillips Climes, MD  [facility]  If 7PM-7AM, please contact night-coverage  Please page via [email or web address]-click on MD name and type text message  03/03/2018, 1:30 PM

## 2018-03-03 NOTE — Consult Note (Addendum)
ELECTROPHYSIOLOGY CONSULT NOTE    Patient ID: Caitlyn Hardy MRN: [id number], DOB/AGE: 1945/06/24 72 y.o.  Admit date: 02/23/2018 Date of Consult: 03/03/2018  Primary Physician: [name], MD Primary Cardiologist: Bronson Ing Electrophysiologist: Allred remotely (2014)  Patient Profile: Caitlyn Hardy is a 72 y.o. female with a history of CAD s/p CABG, chronic systolic heart failure, persistent atrial fibrillation, dementia, ICM who is being seen today for the evaluation of AF at the request of Dr Margaretann Loveless.  HPI:  Caitlyn Hardy is a 72 y.o. female with the above past medical history. Of note, the patient refused to speak with me this morning or let me examine her.  Therefore, information obtained from chart review.   She was admitted to [facility] 02/14/18 with altered mental status felt to be 2/2 UTI. She was seen by cardiology that admission for acute on chronic heart failure and AF.  She was diuresed with IV lasix and transitioned to Torsemide.  Her Coreg was also transitioned to Metoprolol. She was transferred to [facility] per her family's request for ongoing shortness of breath.  There has been concern for medication compliance.  On telemetry while here, she has had AF with RVR despite amiodarone and BB.  EP has been asked to evaluate for treatment options.  She currently is off telemetry 2/2 confusion and combativeness.  She was first seen by Dr Rayann Heman in 2014 and had baseline confusion at that time.    She recalls issues of staggering, but she is not sure why she is here\  Aware of hx of afib  Echo 12/2017 demonstrated EF 25-30%, PA pressure 71, LA 58.; echoes back to 2013 all show EF in 25-40%   HR over that period do not support idea of tachy mediated myopathy     ROS unable to be obtained as patient refuses to speak with me this morning and she has poor recall with me (SK)   Thromboembolic risk factors ( age -51, HTN-1, TIA/CVA-2, DM-1, Vasc disease -1, CHF-1,  Gender-1) for a CHADSVASc Score of >=9   Past Medical History:  Diagnosis Date  . Anemia   . Arthritis   . Asthma   . CAD (coronary artery disease)    a. CABG 2001. b. PCI 2011. c. NSTEMI 12/13-03/2012 in prolonged hosp stay, felt demand isch with stable cath.  . Cancer [facility])    hysterectomy 1979 r/t/ cancer  . Chronic kidney disease   . Chronic systolic CHF (congestive heart failure) ([place])    a. EF 40% 2011, down to 25% 03/2012 felt mixed isch/nonischemic (tachy-mediated).  . COPD (chronic obstructive pulmonary disease) ([place])   . CVA (cerebral infarction)    Followed by Dr. Leonie Man  . Depression   . Diabetes mellitus ([place])   . Ejection fraction    EF 40% in the past   //    EF 25% January, 2014   //   EF 30%, echo, Aug 11, 2012, mid/apical anterior akinesis and anteroseptal akinesis and apical lateral akinesis and akinesis of the true apex.  . H/O hiatal hernia   . Headache(784.0)   . Hx of amiodarone therapy    Atrial fibrillation  . HX: anticoagulation    Atrial fibrillation  . Hypertension   . Morbid obesity ([place])   . Myocardial infarction [facility])    [phone] . 2011 2014  . On home oxygen therapy   . Persistent atrial fibrillation    a. Diagnosed 02/2012 - long hosp stay, difficult to control rates, LAA  thrombus noted, needed emergent DCCV for VT/afib/hypotension 04/14/2012.  . Pulmonary hypertension ([place])    PA pressure 65 mmHg, echo, Aug 11, 2012  . Restless leg   . Sleep apnea    CPAP qhs  . Stroke [facility])    a. Stroke 02/2012 with recurrent CVA in-hospital 09/8293 felt embolic.  . Thrombocytopenia ([place])   . Thrombus of left atrial appendage    a. Possible LAA thrombus on TEE 04/01/12.     Surgical History:  Past Surgical History:  Procedure Laterality Date  . ;    . ABDOMINAL HYSTERECTOMY    . CARDIAC CATHETERIZATION  2013  . CARDIOVERSION N/A 06/02/2014   Procedure: CARDIOVERSION;  Surgeon: Herminio Commons, MD;  Location: [facility];  Service: Endoscopy;   Laterality: N/A;  . CORONARY ARTERY BYPASS GRAFT  2001   x 4 vessels  . ESOPHAGOGASTRODUODENOSCOPY N/A 04/10/2015   Procedure: ESOPHAGOGASTRODUODENOSCOPY (EGD);  Surgeon: Laurence Spates, MD;  Location[facility] ENDOSCOPY;  Service: Endoscopy;  Laterality: N/A;  . knee replacemnt  2000   total right  . LEFT AND RIGHT HEART CATHETERIZATION WITH CORONARY/GRAFT ANGIOGRAM N/A 03/30/2012   Procedure: LEFT AND RIGHT HEART CATHETERIZATION WITH Beatrix Fetters;  Surgeon: Sherren Mocha, MD;  Location: [facility] CATH LAB;  Service: Cardiovascular;  Laterality: N/A;  . rhinosplasty     x 2  . shoulder sx  2000   right bone spurs  . TEE WITHOUT CARDIOVERSION  04/01/2012   Procedure: TRANSESOPHAGEAL ECHOCARDIOGRAM (TEE);  Surgeon: Thayer Headings, MD;  Location: [place];  Service: Cardiovascular;  Laterality: N/A;  . TONSILLECTOMY       Medications Prior to Admission  Medication Sig Dispense Refill Last Dose  . amiodarone (PACERONE) 200 MG tablet TAKE 1 TABLET BY MOUTH DAILY (Patient taking differently: Take 200 mg by mouth daily. ) 60 tablet 0 unk  . atorvastatin (LIPITOR) 40 MG tablet Take 1 tablet every day (Patient taking differently: Take 40 mg by mouth daily. ) 30 tablet 2 unk  . busPIRone (BUSPAR) 5 MG tablet Take 5 mg by mouth 2 (two) times daily as needed (anxiety).    unk  . carvedilol (COREG) 6.25 MG tablet Take 1 tablet (6.25 mg total) by mouth 2 (two) times daily. 180 tablet 1 unk  . cyclobenzaprine (FLEXERIL) 10 MG tablet Take 10 mg by mouth 2 (two) times daily.   unk  . DULoxetine (CYMBALTA) 60 MG capsule Take 60 mg by mouth daily.   unk  . ELIQUIS 5 MG TABS tablet Take 1 tablet (5 mg total) by mouth 2 (two) times daily. 180 tablet 1 unk  . ENTRESTO 97-103 MG Take 1 tablet by mouth 2 (two) times daily. 180 tablet 1 unk  . ergocalciferol (VITAMIN D2) 1.25 MG (50000 UT) capsule Take 50,000 Units by mouth once a week.   unk  . gabapentin (NEURONTIN) 100 MG capsule Take 100 mg by mouth 3  (three) times daily.    unk  . insulin aspart (NOVOLOG) 100 UNIT/ML injection Inject into the skin 3 (three) times daily before meals.   unk  . isosorbide mononitrate (ISMO,MONOKET) 20 MG tablet Take 20 mg by mouth daily.   unk  . levothyroxine (SYNTHROID, LEVOTHROID) 25 MCG tablet Take 25 mcg by mouth every morning.  3 unk  . meclizine (ANTIVERT) 25 MG tablet Take 25 mg by mouth 2 (two) times daily.    unk  . NON FORMULARY CPAP nightly   unk  . NON FORMULARY Oxygen at 3L/min while  out, prn other times.   unk  . omeprazole (PRILOSEC) 20 MG capsule Take 20 mg by mouth 2 (two) times daily before a meal.   unk  . rOPINIRole (REQUIP) 1 MG tablet Take 1-2 mg by mouth at bedtime.    unk  . temazepam (RESTORIL) 22.5 MG capsule Take 22.5 mg by mouth at bedtime as needed for sleep.    unk  . torsemide (DEMADEX) 10 MG tablet Take 10 mg by mouth daily.    unk  . isosorbide dinitrate (ISORDIL) 20 MG tablet Take 20 mg by mouth daily.   Not Taking at Unknown time  . metoprolol succinate (TOPROL-XL) 25 MG 24 hr tablet Take 25 mg by mouth 2 (two) times daily.   Not Taking at Unknown time  . spironolactone (ALDACTONE) 25 MG tablet Take 25 mg by mouth daily.   Not Taking at Unknown time    Inpatient Medications:  . amiodarone  200 mg Oral BID  . apixaban  5 mg Oral BID  . arformoterol  15 mcg Nebulization BID  . budesonide (PULMICORT) nebulizer solution  0.25 mg Nebulization BID  . carvedilol  12.5 mg Oral BID WC  . ciprofloxacin  500 mg Oral BID  . cyanocobalamin  1,000 mcg Subcutaneous Daily  . digoxin  0.0625 mg Oral Daily  . DULoxetine  60 mg Oral QHS  . gabapentin  100 mg Oral TID  . insulin aspart  0-5 Units Subcutaneous QHS  . insulin aspart  0-9 Units Subcutaneous TID WC  . levothyroxine  25 mcg Oral Q0600  . mouth rinse  15 mL Mouth Rinse BID  . rOPINIRole  1 mg Oral QHS  . torsemide  20 mg Oral Daily    Allergies:  Allergies  Allergen Reactions  . Penicillins Anaphylaxis  . Lyrica  [Pregabalin] Swelling  . Rocephin [Ceftriaxone Sodium In Dextrose]     Rash/hives/itching  . Azithromycin Hives and Rash  . Bactrim [Sulfamethoxazole-Trimethoprim] Hives and Rash  . Catapres [Clonidine Hcl] Itching and Rash  . Clonidine Derivatives Itching and Rash  . Sulfa Antibiotics Hives and Rash    Social History   Socioeconomic History  . Marital status: Single    Spouse name: Not on file  . Number of children: 4  . Years of education: GED  . Highest education level: Not on file  Occupational History  . Not on file  Social Needs  . Financial resource strain: Not on file  . Food insecurity:    Worry: Not on file    Inability: Not on file  . Transportation needs:    Medical: Not on file    Non-medical: Not on file  Tobacco Use  . Smoking status: Former Smoker    Types: Cigarettes    Start date: 08/15/1963    Last attempt to quit: 04/01/1975    Years since quitting: 42.9  . Smokeless tobacco: Never Used  Substance and Sexual Activity  . Alcohol use: No    Alcohol/week: 0.0 standard drinks  . Drug use: No  . Sexual activity: Not on file  Lifestyle  . Physical activity:    Days per week: Not on file    Minutes per session: Not on file  . Stress: Not on file  Relationships  . Social connections:    Talks on phone: Not on file    Gets together: Not on file    Attends religious service: Not on file    Active member of club or organization: Not  on file    Attends meetings of clubs or organizations: Not on file    Relationship status: Not on file  . Intimate partner violence:    Fear of current or ex partner: Not on file    Emotionally abused: Not on file    Physically abused: Not on file    Forced sexual activity: Not on file  Other Topics Concern  . Not on file  Social History Narrative   Patient is single with 4 children   Patient has a GED   Patient is right handed   Patient drinks 2 cups daily     Family History  Problem Relation Age of Onset  . CAD  Unknown   . Heart disease Unknown      Review of Systems: All other systems reviewed and are otherwise negative except as noted above.  Physical Exam: Vitals:   03/02/18 1708 03/02/18 2012 03/02/18 2034 03/03/18 0608  BP: 106/85 (!) 94/50  116/85  Pulse: 68 80  95  Resp: (!) 22 20  (!) 24  Temp: 97.8 F (36.6 C) 97.8 F (36.6 C)  97.6 F (36.4 C)  TempSrc: Oral Oral  Axillary  SpO2: 100%  96% 97%  Weight:    126.1 kg  Height:        GEN- The patient is elderly and obese   Exam unable to be obtained as patient refuses to speak with me this morning Well developed and nourished in no acute distress HENT normal Neck supple   Carotids brisk and full without bruits Clear Irregularly irregular rate and rhythm with controlled ventricular response, no murmurs or gallops Abd-soft with active BS without hepatomegaly No Clubbing cyanosis 1+ edema Skin-warm and dry A & Oriented  Grossly normal sensory and motor function .pes Labs:   Lab Results  Component Value Date   WBC 4.5 03/02/2018   HGB 10.1 (L) 03/02/2018   HCT 35.3 (L) 03/02/2018   MCV 92.9 03/02/2018   PLT 204 03/02/2018    Recent Labs  Lab 03/03/18 0500  NA 143  K 3.6  CL 96*  CO2 36*  BUN 23  CREATININE 1.48*  CALCIUM 8.6*  GLUCOSE 107*      Radiology/Studies: Dg Skull 1-3 Views  Result Date: 03/02/2018 CLINICAL DATA:  MRI screening. EXAM: SKULL - 1-3 VIEW COMPARISON:  CT 04/14/2012. FINDINGS: No metallic foreign bodies are noted. Small lucencies are noted throughout the skull. Process such as myeloma or metastatic disease cannot be excluded. IMPRESSION: 1. No metallic marker foreign bodies noted. Patient cleared for MRI. 2. Small lucencies noted throughout the skull. A process such as myeloma or metastatic disease cannot be excluded. Electronically Signed   By: Marcello Moores  Register   On: 03/02/2018 09:39   Mr Brain Wo Contrast  Result Date: 03/02/2018 CLINICAL DATA:  Dementia. Worsened mental status  recently. History of heart failure. EXAM: MRI HEAD WITHOUT CONTRAST TECHNIQUE: Multiplanar, multiecho pulse sequences of the brain and surrounding structures were obtained without intravenous contrast. COMPARISON:  Head CT 04/14/2012.  Brain MRI 04/05/2012. FINDINGS: Brain: Diffusion imaging does not show any acute or subacute infarction. The brainstem is normal. There are numerous old small vessel cerebellar infarctions which were acute in 2014. Left hemisphere shows what appears to be an old cortical infarction in the lateral surface of the left temporal lobe, similar to the study of 2014. In the right posterior frontal brain, there is a brain abnormality containing calcification and or blood products, with surrounding vasogenic edema  pattern versus is a chronic gliosis pattern. The region in total measures up to 5 cm in diameter. The more central component measures about 2.5 cm in diameter. No significant mass effect or shift. The question is of this represents a low to intermediate grade primary brain tumor or an unusual old infarction. I would recommend post-contrast imaging to help is characterize this further. Vascular: Major vessels at the base of the brain show flow. Skull and upper cervical spine: Negative Sinuses/Orbits: Mucosal inflammatory changes of the left division of the sphenoid sinus. Orbits negative. Other: None IMPRESSION: No acute brain infarction. Old small vessel cerebellar infarctions. Old infarction in the lateral left temporal lobe. Right posterior frontal brain abnormality which is indeterminate for low to intermediate grade primary brain tumor versus an unusual manifestation of old infarction. I would recommend post-contrast imaging as a next step to help [place] characterize this further. These results will be called to the ordering clinician or representative by the Radiologist Assistant, and communication documented in the PACS or zVision Dashboard. Electronically Signed   By: Nelson Chimes  M.D.   On: 03/02/2018 10:56   Mr Brain W Contrast  Result Date: 03/02/2018 CLINICAL DATA:  Initial evaluation for EXAM: MRI HEAD WITH CONTRAST TECHNIQUE: Multiplanar, multiecho pulse sequences of the brain and surrounding structures were obtained with intravenous contrast. CONTRAST:  10 cc of Gadavist. COMPARISON:  Prior noncontrast brain MRI from earlier the same day. FINDINGS: Postcontrast imaging demonstrates no associated enhancement about the previously identified posterior right frontal signal changes. Given this, finding favored to reflect sequelae of chronic ischemia. No other abnormal enhancement elsewhere within the brain. Remainder of the examination is unchanged. IMPRESSION: 1. 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 A short interval follow-up study in 3 months is suggested to document stability. 2. No other abnormal enhancement elsewhere within the brain. Electronically Signed   By: Jeannine Boga M.D.   On: 03/02/2018 20:27   Dg Chest Port 1 View  Result Date: 02/24/2018 CLINICAL DATA:  PICC line placement EXAM: PORTABLE CHEST 1 VIEW COMPARISON:  02/23/2018 FINDINGS: Right-sided PICC line tip projects over the lower SVC. There are median sternotomy wires and sequelae of CABG. Unchanged cardiomegaly. Slightly increased central pulmonary vascular congestion and multifocal atelectasis of the right lung. IMPRESSION: Right-sided PICC line tip projects over the lower SVC. Electronically Signed   By: Ulyses Jarred M.D.   On: 02/24/2018 03:13   Dg Chest Port 1 View  Result Date: 02/23/2018 CLINICAL DATA:  Hypoxia EXAM: PORTABLE CHEST 1 VIEW COMPARISON:  04/03/2015 FINDINGS: Post sternotomy changes. Cardiomegaly with central vascular congestion. No pleural effusion. No pneumothorax. Tubing or lead over the right upper chest, if this is a vascular catheter takes a slightly unusual curved course  distally. IMPRESSION: 1. Cardiomegaly with vascular congestion. 2. Catheter tubing or external lead over the right chest for which clinical correlation is recommended Electronically Signed   By: Donavan Foil M.D.   On: 02/23/2018 23:56   [place] Ekg Site Rite  Result Date: 02/24/2018 If Site Rite image not attached, placement could not be confirmed due to current cardiac rhythm.   [id number] fibrillation, rate 93 (personally reviewed)  TELEMETRY: AF, V rates 90-120's (personally reviewed)  Assessment/Plan: 1.  Persistent atrial fibrillation with RVR The patient has persistent AF with RVR - per notes, she has been asymptomatic Rates are relatively well controlled on recent telemetry Difficult to make disposition without her being  willing to speak with me this morning.  Would continue amiodarone and coreg With baseline confusion, I think her procedural risks are very high and at this point, she would not be a candidate for AVN ablation/pacemaker Continue Eliquis for CHADS2VASC of 7 With LA enlargement and elevated PA pressures, I think our chances of maintaining SR long term are low  2.  ICM/chronic systolic heart failure With co-morbidities and baseline confusion, she would not be an ICD candidate  Dr Caryl Comes to see later today. Hopefully her family will be present or she will be willing to speak with [place] at that time.     For questions or updates, please contact [place] Please consult [email or web address] for contact info under Cardiology/STEMI.  Signed, Chanetta Marshall, NP 03/03/2018 9:14 AM  AFib persistent  ( last sinus of ECG 2018)   Ischemic Cardiomyopathy  S/p CABG  L Atriopathy  CHF chronic systolic   Dementia/Delirium   Hypotension without orthostasis  Morbidly obese  Renal Insufficiency gd 3   Patient heart rates are much better controlled than in the setting of the acute illness.  Amiodarone is a rate controlling agent is a class IIb indication, and I think in her  case is a reasonable thing to continue.  Her hypotension limits rate controlling drugs.  European guidelines have use diltiazem adjunctively with beta-blockers in patients not withstanding left ventricular dysfunction and this could be tried.  More easily tried would be the discontinuation of carvedilol and the introduction of metoprolol succinate as the former has, with its alpha blocking effects, more blood pressure lowering impact for given heart rate effect.  Mental status makes the consideration of invasive procedures less appealing.  Have reviewed with her and her son.  We will be available as needed.

## 2018-03-03 NOTE — Progress Notes (Signed)
Pt noted with increasing anxiety, agitation & hallucinations.  Pt ripping bed controls out of the wall, taking bedding off, removing telemetry monitor.   She has stated the following: electrodes were butterflies, the colored wires from telemetry monitor were decorations for our dresses, "Cristie Hem is a rapist, she is one of the high-ups", "there is a man behind her" and "he is her friend" but she "can't tell anyone, he is a Transport planner".  She also believes that someone is trying to kill her son and the man in the orange shirt across the hall is "peaking under the door, he is freak", "I'm gonna take a baseball bat to him if he pushes it" Pt also noted with tearful periods.  When asked why she said "I know what's coming, "I can't tell you" MD notified and requested psych consult.  Son notified.   Anson Crofts RN

## 2018-03-03 NOTE — Progress Notes (Signed)
Pt has alteration of conscious, combative behavior and refused to take medicines tonight. High fall risk, bed alarm and low bed with floor mat applied. Tele sitter is continually on. Continue to monitor.  Kennyth Lose, RN

## 2018-03-03 NOTE — Progress Notes (Signed)
Patient is in an altered mental state.  RT tried to give meds patient refused states something is in there that will harm her.  Rt will continue to monitor.

## 2018-03-03 NOTE — Progress Notes (Signed)
Physical Therapy Treatment Patient Details Name: Caitlyn Hardy MRN: [id number] DOB: [birth year] Today's Date: 03/03/2018    History of Present Illness 72 y.o. female admitted on 02/23/18 for orthostatic hypotension, A-fib, and likely UTI.  Pt with significant PMH of thrombocytopenia, stroke, pulmonary HTN, PAF, MI, morbid obesity, HTN, DM, COPD, chronic systolic CHF, CAD, anemia, R shoulder surgery, R TKA, and CABG.      PT Comments    Patient with regression towards physical therapy goals this session secondary to increased confusion, agitation, and restlessness. Pt received in chair; according to RN continues to require min assist for transfers for safety. Pt stating she was "in my shop and gathering supplies," while putting oxygen tubing, telemetry cords, and gait belt into a garbage bag. Treatment limited, focusing on chair level exercises and dynamic seated balance. HR 90s-128 bpm. Unable to redirect attention or promote mobility/ambulation despite multimodal cues and encouragement. Pt becoming increasingly agitated, asking PT to leave room immediately. D/c plan remains appropriate.   Follow Up Recommendations  SNF;Supervision/Assistance - 24 hour     Equipment Recommendations  None recommended by PT    Recommendations for Other Services       Precautions / Restrictions Precautions Precautions: Fall Precaution Comments: monitor HR and BP Restrictions Weight Bearing Restrictions: No    Mobility  Bed Mobility               General bed mobility comments: received in chair  Transfers        unable            Ambulation/Gait        unable         Stairs             Wheelchair Mobility    Modified Rankin (Stroke Patients Only)       Balance Overall balance assessment: Needs assistance Sitting-balance support: No upper extremity supported;Feet supported Sitting balance-Leahy Scale: Good                                      Cognition Arousal/Alertness: Awake/alert Behavior During Therapy: Restless;Agitated Overall Cognitive Status: Impaired/Different from baseline Area of Impairment: Following commands;Safety/judgement;Problem solving                 Orientation Level: Disoriented to;Situation;Place Current Attention Level: Sustained Memory: Decreased short-term memory Following Commands: Follows one step commands inconsistently Safety/Judgement: Decreased awareness of safety;Decreased awareness of deficits Awareness: Intellectual Problem Solving: Requires verbal cues;Requires tactile cues General Comments: Pt stating she was "in the shop and gathering her supplies," meanwhile packing telemetry, blood pressure cuffs, and oxygen tubing into a garbage bag. Pt becoming easily agitated and unable to redirect to current tasks despite multimodal cueing      Exercises General Exercises - Lower Extremity Ankle Circles/Pumps: 5 reps;Both;Seated Other Exercises Other Exercises: Seated dynamic balance including functional reaching, brushing hair with no back support    General Comments        Pertinent Vitals/Pain Pain Assessment: Faces Faces Pain Scale: No hurt    Home Living                      Prior Function            PT Goals (current goals can now be found in the care plan section) Acute Rehab PT Goals Patient Stated Goal: to go home PT Goal Formulation: With patient  Time For Goal Achievement: 03/11/18 Potential to Achieve Goals: Good Progress towards PT goals: Not progressing toward goals - comment(more confused/agitated this session)    Frequency    Min 2X/week      PT Plan Current plan remains appropriate    Co-evaluation              AM-PAC PT "6 Clicks" Mobility   Outcome Measure  Help needed turning from your back to your side while in a flat bed without using bedrails?: None Help needed moving from lying on your back to sitting on the side of a flat  bed without using bedrails?: None Help needed moving to and from a bed to a chair (including a wheelchair)?: A Little Help needed standing up from a chair using your arms (e.g., wheelchair or bedside chair)?: A Little Help needed to walk in hospital room?: A Lot Help needed climbing 3-5 steps with a railing? : A Lot 6 Click Score: 18    End of Session   Activity Tolerance: Treatment limited secondary to agitation Patient left: in chair;with call bell/phone within reach;with chair alarm set(tele sitter in room) Nurse Communication: Mobility status PT Visit Diagnosis: Muscle weakness (generalized) (M62.81);Difficulty in walking, not elsewhere classified (R26.2)     Time: 2426-8341 PT Time Calculation (min) (ACUTE ONLY): 20 min  Charges:  $Therapeutic Activity: 8-22 mins                     Ellamae Sia, PT, DPT Acute Rehabilitation Services Pager [phone] Office [phone]    Willy Eddy 03/03/2018, 9:40 AM

## 2018-03-04 DIAGNOSIS — I5033 Acute on chronic diastolic (congestive) heart failure: Secondary | ICD-10-CM

## 2018-03-04 LAB — BASIC METABOLIC PANEL
ANION GAP: 10 (ref 5–15)
BUN: 23 mg/dL (ref 8–23)
CO2: 35 mmol/L — ABNORMAL HIGH (ref 22–32)
Calcium: 8.9 mg/dL (ref 8.9–10.3)
Chloride: 96 mmol/L — ABNORMAL LOW (ref 98–111)
Creatinine, Ser: 1.53 mg/dL — ABNORMAL HIGH (ref 0.44–1.00)
GFR calc Af Amer: 39 mL/min — ABNORMAL LOW (ref >60)
GFR calc non Af Amer: 34 mL/min — ABNORMAL LOW (ref >60)
Glucose, Bld: 110 mg/dL — ABNORMAL HIGH (ref 70–99)
Potassium: 3.9 mmol/L (ref 3.5–5.1)
Sodium: 141 mmol/L (ref 135–145)

## 2018-03-04 LAB — MAGNESIUM: MAGNESIUM: 2.1 mg/dL (ref 1.7–2.4)

## 2018-03-04 LAB — GLUCOSE, CAPILLARY
Glucose-Capillary: 111 mg/dL — ABNORMAL HIGH (ref 70–99)
Glucose-Capillary: 108 mg/dL — ABNORMAL HIGH (ref 70–99)
Glucose-Capillary: 95 mg/dL (ref 70–99)
Glucose-Capillary: 80 mg/dL (ref 70–99)

## 2018-03-04 MED ORDER — QUETIAPINE FUMARATE 25 MG PO TABS
50.0000 mg | ORAL_TABLET | Freq: Every day | ORAL | Status: DC
  Administered 2018-03-04: 50 mg via ORAL
  Filled 2018-03-04: qty 2

## 2018-03-04 MED ORDER — POTASSIUM CHLORIDE CRYS ER 20 MEQ PO TBCR
20.0000 meq | EXTENDED_RELEASE_TABLET | Freq: Once | ORAL | Status: AC
  Administered 2018-03-04: 20 meq via ORAL
  Filled 2018-03-04: qty 1

## 2018-03-04 NOTE — Progress Notes (Signed)
Progress Note  Patient Name: Caitlyn Hardy Date of Encounter: 03/04/2018  Primary Cardiologist: Kate Sable, MD   Subjective   HR has improved, she is mildly confused on my visit. Sitting up in chair.  Inpatient Medications    Scheduled Meds: . amiodarone  200 mg Oral BID  . apixaban  5 mg Oral BID  . arformoterol  15 mcg Nebulization BID  . budesonide (PULMICORT) nebulizer solution  0.25 mg Nebulization BID  . ciprofloxacin  250 mg Oral BID  . cyanocobalamin  1,000 mcg Subcutaneous Daily  . digoxin  0.0625 mg Oral Daily  . DULoxetine  60 mg Oral QHS  . gabapentin  100 mg Oral TID  . insulin aspart  0-5 Units Subcutaneous QHS  . insulin aspart  0-9 Units Subcutaneous TID WC  . levothyroxine  25 mcg Oral Q0600  . mouth rinse  15 mL Mouth Rinse BID  . metoprolol succinate  25 mg Oral BID  . potassium chloride  20 mEq Oral Once  . QUEtiapine  50 mg Oral Q2000  . rOPINIRole  1 mg Oral QHS  . torsemide  20 mg Oral Daily   Continuous Infusions:  PRN Meds: acetaminophen, ipratropium, levalbuterol, ondansetron (ZOFRAN) IV, sodium chloride flush, temazepam   Vital Signs    Vitals:   03/03/18 2001 03/04/18 0523 03/04/18 0715 03/04/18 1239  BP: 126/78 120/77  120/66  Pulse: (!) 109 (!) 103  85  Resp: (!) 22 14  18   Temp: (!) 97.5 F (36.4 C) (!) 97.5 F (36.4 C)  98.3 F (36.8 C)  TempSrc: Oral Oral  Oral  SpO2: 98% 100% 97% 95%  Weight:      Height:        Intake/Output Summary (Last 24 hours) at 03/04/2018 1424 Last data filed at 03/04/2018 0649 Gross per 24 hour  Intake 250 ml  Output 1000 ml  Net -750 ml   Filed Weights   03/01/18 0403 03/02/18 0405 03/03/18 3790  Weight: 126.9 kg 129.3 kg 126.1 kg    Telemetry    afib rates 80-100- Personally Reviewed  ECG    No new  Physical Exam   GEN: No acute distress.   Neck: No JVD Cardiac: irregular rhythm, normal rate, no murmurs, rubs, or gallops.  Respiratory: Clear to auscultation  bilaterally. GI: Soft, nontender, non-distended  MS: bilateral edema; No deformity. Neuro:  Nonfocal, confused but pleasant. Psych: Normal affect   Labs    Chemistry Recent Labs  Lab 03/02/18 0400 03/03/18 0500 03/04/18 0617  NA 140 143 141  K 3.8 3.6 3.9  CL 96* 96* 96*  CO2 33* 36* 35*  GLUCOSE 119* 107* 110*  BUN 30* 23 23  CREATININE 1.65* 1.48* 1.53*  CALCIUM 8.9 8.6* 8.9  GFRNONAA 31* 35* 34*  GFRAA 36* 41* 39*  ANIONGAP 11 11 10      Hematology Recent Labs  Lab 02/26/18 0342 03/02/18 0400  WBC 4.4 4.5  RBC 3.33* 3.80*  HGB 9.0* 10.1*  HCT 31.4* 35.3*  MCV 94.3 92.9  MCH 27.0 26.6  MCHC 28.7* 28.6*  RDW 16.3* 16.5*  PLT 109* 204    Cardiac EnzymesNo results for input(s): TROPONINI in the last 168 hours. No results for input(s): TROPIPOC in the last 168 hours.   BNPNo results for input(s): BNP, PROBNP in the last 168 hours.   DDimer No results for input(s): DDIMER in the last 168 hours.   Radiology    Mr Brain W Contrast  Result  Date: 03/02/2018 CLINICAL DATA:  Initial evaluation for EXAM: MRI HEAD WITH CONTRAST TECHNIQUE: Multiplanar, multiecho pulse sequences of the brain and surrounding structures were obtained with intravenous contrast. CONTRAST:  10 cc of Gadavist. COMPARISON:  Prior noncontrast brain MRI from earlier the same day. FINDINGS: Postcontrast imaging demonstrates no associated enhancement about the previously identified posterior right frontal signal changes. Given this, finding favored to reflect sequelae of chronic ischemia. No other abnormal enhancement elsewhere within the brain. Remainder of the examination is unchanged. IMPRESSION: 1. 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 A short interval follow-up study in 3 months is suggested to document stability. 2. No other abnormal enhancement elsewhere within the brain. Electronically Signed   By:  Jeannine Boga M.D.   On: 03/02/2018 20:27    Cardiac Studies    Patient Profile    Assessment & Plan   Principal Problem:   Orthostatic hypotension Active Problems:   Morbid obesity (HCC)   Thrombocytopenia (HCC)   Paroxysmal atrial fibrillation (HCC)   Sleep apnea   A-fib (HCC)   UTI (urinary tract infection)   Metabolic encephalopathy   (HFpEF) heart failure with preserved ejection fraction (HCC)   Her rate is better controlled on metoprolol succinate. No clear indication to uptitrate dose today.  Continue medications as listed below.  She will be dismissed to SNF tomorrow, no further inpatient cardiovascular workup or management needed.  [facility] will sign off.   Medication Recommendations: Amiodarone 200 mg twice daily, digoxin 0.0625 mg daily, apixaban 5 mg twice daily, metoprolol succinate 25 mg daily, torsemide 20 mg daily Other recommendations (labs, testing, etc): Will need digoxin levels checked at home. Follow up as an outpatient: Follow-up will be arranged with Dr. Bronson Ing or care team in 2 to 4 weeks.  For questions or updates, please contact [place] Please consult [email or web address] for contact info under      Signed, Elouise Munroe, MD  03/04/2018, 2:24 PM

## 2018-03-04 NOTE — Progress Notes (Addendum)
CSW left voice message to return call  for  admissions director Nira Conn with [facility] at [phone], to inform of anticipated discharge of patient  tomorrow and to confirm bed placement.   CSW called [facility] and confirm authorization is good up to 14 days.   CSW called patient's son, Ronalee Belts to advise of possible discharge tomorrow after lunch. Patient's son Ronalee Belts states he is employed by an ambulance service and will utilize this service to transport the patient to the facility. CSW explain waiting to hear back from [place] to confirm availability.   Thurmond Butts, [place] Social Worker [phone]

## 2018-03-04 NOTE — Progress Notes (Signed)
Patient AMS.  CPAP not placed on patient at this time.

## 2018-03-04 NOTE — Progress Notes (Signed)
PROGRESS NOTE        PATIENT DETAILS Name: Caitlyn Hardy Age: 72 y.o. Sex: female Date of Birth: 1945-04-20 Admit Date: 02/23/2018 Admitting Physician Elwyn Reach, MD [id number], Lupita Dawn, MD  Brief Narrative:  Patient is a 72 y.o. female with history of chronic systolic heart failure, COPD on 3-4 L of oxygen at home, CAD status post CABG, PAF, probable dementia at baseline-transferred from [facility] for evaluation of altered mental status, orthostatic hypotension and A. fib with RVR.  See below for further details  Subjective: She is with some confusion delirium overnight, but remains appropriate, quite uncomfortable this morning   Assessment/Plan:  Altered mental status-likely delirium:  -Patient with some baseline mild dementia, but apparently she has been been having significant hospital delirium, as well she has mild UTI contributing to it, her MRI brain with without contrast, significant for old CVA, B12 on the lower side as well, which we are repleting .EEG showing some encephalopathic, but no evidence of seizures . -Had some delirium overnight, but this morning she is calm, comfortable -We will increase her evening dose of Seroquel  A. fib with RVR -Heart rate was difficult to control, input appreciated, continue with amiodarone, Coreg has been switched to Toprol-XL, and actually heart rate much improved on Toprol-XL this morning, averaging in the 90s. -Keep magnesium more than 2, and potassium more than 4  Orthostatic hypotension: - Resolved.  Mild acute on chronic systolic heart failure (EF 25-30%):  - Volume status is stable-has required IV Lasix intermittently-continue Demadex.  CKD stage III: Creatinine remains close to usual baseline-follow electro lites periodically.  COPD with chronic hypoxic respiratory failure on home O2:  - No evidence of exacerbation-continue bronchodilators.    Hypothyroidism: Continue  Synthroid  Hypertension: Controlled-continue metoprolol and diuretics.  DM-2: CBG stable with SSI.  Depression: Continue Cymbalta, Neurontin and Requip.  OSA: CPAP nightly  DVT Prophylaxis: Full dose anticoagulation with Eliquis  Code Status: Full code   Family Communication: None at bedside  Disposition Plan: SNF discharge in a.m. if heart rate remains controlled  Antimicrobial agents: Anti-infectives (From admission, onward)   Start     Dose/Rate Route Frequency Ordered Stop   03/03/18 1100  ciprofloxacin (CIPRO) tablet 250 mg     250 mg Oral 2 times daily 03/03/18 1052 03/06/18 0759   03/03/18 0800  ciprofloxacin (CIPRO) tablet 500 mg  Status:  Discontinued     500 mg Oral 2 times daily 03/03/18 0647 03/03/18 1052   02/24/18 0200  aztreonam (AZACTAM) 1 g in sodium chloride 0.9 % 100 mL IVPB  Status:  Discontinued     1 g 200 mL/hr over 30 Minutes Intravenous Every 8 hours 02/24/18 0100 02/24/18 1418      Procedures: None  CONSULTS:  cardiology   MEDICATIONS: Scheduled Meds: . amiodarone  200 mg Oral BID  . apixaban  5 mg Oral BID  . arformoterol  15 mcg Nebulization BID  . budesonide (PULMICORT) nebulizer solution  0.25 mg Nebulization BID  . ciprofloxacin  250 mg Oral BID  . cyanocobalamin  1,000 mcg Subcutaneous Daily  . digoxin  0.0625 mg Oral Daily  . DULoxetine  60 mg Oral QHS  . gabapentin  100 mg Oral TID  . insulin aspart  0-5 Units Subcutaneous QHS  . insulin aspart  0-9 Units Subcutaneous TID WC  .  levothyroxine  25 mcg Oral Q0600  . mouth rinse  15 mL Mouth Rinse BID  . metoprolol succinate  25 mg Oral BID  . potassium chloride  20 mEq Oral Once  . QUEtiapine  50 mg Oral Q2000  . rOPINIRole  1 mg Oral QHS  . torsemide  20 mg Oral Daily   Continuous Infusions: PRN Meds:.acetaminophen, ipratropium, levalbuterol, ondansetron (ZOFRAN) IV, sodium chloride flush, temazepam   PHYSICAL EXAM: Vital signs: Vitals:   03/03/18 1230 03/03/18 2001  03/04/18 0523 03/04/18 0715  BP: (!) 125/57 126/78 120/77   Pulse: 94 (!) 109 (!) 103   Resp: 17 (!) 22 14   Temp: 98.5 F (36.9 C) (!) 97.5 F (36.4 C) (!) 97.5 F (36.4 C)   TempSrc: Oral Oral Oral   SpO2: 100% 98% 100% 97%  Weight:      Height:       Filed Weights   03/01/18 0403 03/02/18 0405 03/03/18 0608  Weight: 126.9 kg 129.3 kg 126.1 kg   Body mass index is 44.2 kg/m.   Awake Alert, attentive, mildly confused, she is awake x2 , laying in bed comfortably, and she was set by OT on the recliner, currently sitting on recliner, appears comfortable, pleasant Awake Alert, Oriented X 3, No new F.N deficits, Normal affect Symmetrical Chest wall movement, Good air movement bilaterally, CTAB Irregularly irregular, heart rate controlled,Rubs or new Murmurs, No Parasternal Heave +ve B.Sounds, Abd Soft, No tenderness, No rebound - guarding or rigidity. No Cyanosis, Clubbing or edema, No new Rash or bruise      I have personally reviewed following labs and imaging studies  LABORATORY DATA: CBC: Recent Labs  Lab 02/26/18 0342 03/02/18 0400  WBC 4.4 4.5  HGB 9.0* 10.1*  HCT 31.4* 35.3*  MCV 94.3 92.9  PLT 109* 564    Basic Metabolic Panel: Recent Labs  Lab 02/28/18 0340 03/01/18 0500 03/02/18 0400 03/03/18 0500 03/04/18 0617  NA 137 140 140 143 141  K 4.3 3.7 3.8 3.6 3.9  CL 98 95* 96* 96* 96*  CO2 24 32 33* 36* 35*  GLUCOSE 97 108* 119* 107* 110*  BUN 36* 32* 30* 23 23  CREATININE 1.87* 1.85* 1.65* 1.48* 1.53*  CALCIUM 8.6* 8.8* 8.9 8.6* 8.9  MG  --   --   --   --  2.1    GFR: Estimated Creatinine Clearance: 45.5 mL/min (A) (by C-G formula based on SCr of 1.53 mg/dL (H)).  Liver Function Tests: No results for input(s): AST, ALT, ALKPHOS, BILITOT, PROT, ALBUMIN in the last 168 hours. No results for input(s): LIPASE, AMYLASE in the last 168 hours. No results for input(s): AMMONIA in the last 168 hours.  Coagulation Profile: No results for input(s): INR,  PROTIME in the last 168 hours.  Cardiac Enzymes: No results for input(s): CKTOTAL, CKMB, CKMBINDEX, TROPONINI in the last 168 hours.  BNP (last 3 results) No results for input(s): PROBNP in the last 8760 hours.  HbA1C: No results for input(s): HGBA1C in the last 72 hours.  CBG: Recent Labs  Lab 03/03/18 1115 03/03/18 1607 03/03/18 2214 03/04/18 0632 03/04/18 1120  GLUCAP 107* 98 91 111* 108*    Lipid Profile: No results for input(s): CHOL, HDL, LDLCALC, TRIG, CHOLHDL, LDLDIRECT in the last 72 hours.  Thyroid Function Tests: No results for input(s): TSH, T4TOTAL, FREET4, T3FREE, THYROIDAB in the last 72 hours.  Anemia Panel: Recent Labs    03/02/18 1223  VITAMINB12 266    Urine  analysis:    Component Value Date/Time   COLORURINE YELLOW 03/02/2018 1308   APPEARANCEUR CLEAR 03/02/2018 1308   LABSPEC 1.011 03/02/2018 1308   PHURINE 6.0 03/02/2018 1308   GLUCOSEU NEGATIVE 03/02/2018 1308   HGBUR NEGATIVE 03/02/2018 1308   BILIRUBINUR NEGATIVE 03/02/2018 1308   KETONESUR NEGATIVE 03/02/2018 1308   PROTEINUR NEGATIVE 03/02/2018 1308   UROBILINOGEN 1.0 03/26/2012 0100   NITRITE NEGATIVE 03/02/2018 1308   LEUKOCYTESUR SMALL (A) 03/02/2018 1308    Sepsis Labs: Lactic Acid, Venous    Component Value Date/Time   LATICACIDVEN 1.9 04/03/2012 1839    MICROBIOLOGY: Recent Results (from the past 240 hour(s))  MRSA PCR Screening     Status: Abnormal   Collection Time: 02/23/18  9:16 PM  Result Value Ref Range Status   MRSA by PCR POSITIVE (A) NEGATIVE Final    Comment:        The GeneXpert MRSA Assay (FDA approved for NASAL specimens only), is one component of a comprehensive MRSA colonization surveillance program. It is not intended to diagnose MRSA infection nor to guide or monitor treatment for MRSA infections. RESULT CALLED TO, READ BACK BY AND VERIFIED WITHGearldine Shown RN [id number] 02/24/18 A BROWNING Performed at [facility], [place] [address]  [place],  [address]     RADIOLOGY STUDIES/RESULTS: Dg Skull 1-3 Views  Result Date: 03/02/2018 CLINICAL DATA:  MRI screening. EXAM: SKULL - 1-3 VIEW COMPARISON:  CT 04/14/2012. FINDINGS: No metallic foreign bodies are noted. Small lucencies are noted throughout the skull. Process such as myeloma or metastatic disease cannot be excluded. IMPRESSION: 1. No metallic marker foreign bodies noted. Patient cleared for MRI. 2. Small lucencies noted throughout the skull. A process such as myeloma or metastatic disease cannot be excluded. Electronically Signed   By: Marcello Moores  Register   On: 03/02/2018 09:39   Mr Brain Wo Contrast  Result Date: 03/02/2018 CLINICAL DATA:  Dementia. Worsened mental status recently. History of heart failure. EXAM: MRI HEAD WITHOUT CONTRAST TECHNIQUE: Multiplanar, multiecho pulse sequences of the brain and surrounding structures were obtained without intravenous contrast. COMPARISON:  Head CT 04/14/2012.  Brain MRI 04/05/2012. FINDINGS: Brain: Diffusion imaging does not show any acute or subacute infarction. The brainstem is normal. There are numerous old small vessel cerebellar infarctions which were acute in 2014. Left hemisphere shows what appears to be an old cortical infarction in the lateral surface of the left temporal lobe, similar to the study of 2014. In the right posterior frontal brain, there is a brain abnormality containing calcification and or blood products, with surrounding vasogenic edema pattern versus is a chronic gliosis pattern. The region in total measures up to 5 cm in diameter. The more central component measures about 2.5 cm in diameter. No significant mass effect or shift. The question is of this represents a low to intermediate grade primary brain tumor or an unusual old infarction. I would recommend post-contrast imaging to help is characterize this further. Vascular: Major vessels at the base of the brain show flow. Skull and upper cervical spine: Negative  Sinuses/Orbits: Mucosal inflammatory changes of the left division of the sphenoid sinus. Orbits negative. Other: None IMPRESSION: No acute brain infarction. Old small vessel cerebellar infarctions. Old infarction in the lateral left temporal lobe. Right posterior frontal brain abnormality which is indeterminate for low to intermediate grade primary brain tumor versus an unusual manifestation of old infarction. I would recommend post-contrast imaging as a next step to help [place] characterize this further. These  results will be called to the ordering clinician or representative by the Radiologist Assistant, and communication documented in the PACS or zVision Dashboard. Electronically Signed   By: Nelson Chimes M.D.   On: 03/02/2018 10:56   Mr Brain W Contrast  Result Date: 03/02/2018 CLINICAL DATA:  Initial evaluation for EXAM: MRI HEAD WITH CONTRAST TECHNIQUE: Multiplanar, multiecho pulse sequences of the brain and surrounding structures were obtained with intravenous contrast. CONTRAST:  10 cc of Gadavist. COMPARISON:  Prior noncontrast brain MRI from earlier the same day. FINDINGS: Postcontrast imaging demonstrates no associated enhancement about the previously identified posterior right frontal signal changes. Given this, finding favored to reflect sequelae of chronic ischemia. No other abnormal enhancement elsewhere within the brain. Remainder of the examination is unchanged. IMPRESSION: 1. 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 A short interval follow-up study in 3 months is suggested to document stability. 2. No other abnormal enhancement elsewhere within the brain. Electronically Signed   By: Jeannine Boga M.D.   On: 03/02/2018 20:27   Dg Chest Port 1 View  Result Date: 02/24/2018 CLINICAL DATA:  PICC line placement EXAM: PORTABLE CHEST 1 VIEW COMPARISON:  02/23/2018 FINDINGS: Right-sided PICC line tip  projects over the lower SVC. There are median sternotomy wires and sequelae of CABG. Unchanged cardiomegaly. Slightly increased central pulmonary vascular congestion and multifocal atelectasis of the right lung. IMPRESSION: Right-sided PICC line tip projects over the lower SVC. Electronically Signed   By: Ulyses Jarred M.D.   On: 02/24/2018 03:13   Dg Chest Port 1 View  Result Date: 02/23/2018 CLINICAL DATA:  Hypoxia EXAM: PORTABLE CHEST 1 VIEW COMPARISON:  04/03/2015 FINDINGS: Post sternotomy changes. Cardiomegaly with central vascular congestion. No pleural effusion. No pneumothorax. Tubing or lead over the right upper chest, if this is a vascular catheter takes a slightly unusual curved course distally. IMPRESSION: 1. Cardiomegaly with vascular congestion. 2. Catheter tubing or external lead over the right chest for which clinical correlation is recommended Electronically Signed   By: Donavan Foil M.D.   On: 02/23/2018 23:56   [place] Ekg Site Rite  Result Date: 02/24/2018 If Site Rite image not attached, placement could not be confirmed due to current cardiac rhythm.    LOS: 9 days   Phillips Climes, MD  [facility]  If 7PM-7AM, please contact night-coverage  Please page via [email or web address] [email or web address]-click on MD name and type text message  03/04/2018, 12:31 PM

## 2018-03-04 NOTE — Progress Notes (Signed)
Occupational Therapy Treatment Patient Details Name: Caitlyn Hardy MRN: [id number] DOB: 1945-08-10 Today's Date: 03/04/2018    History of present illness 72 y.o. female admitted on 02/23/18 for orthostatic hypotension, A-fib, and likely UTI.  Pt with significant PMH of thrombocytopenia, stroke, pulmonary HTN, PAF, MI, morbid obesity, HTN, DM, COPD, chronic systolic CHF, CAD, anemia, R shoulder surgery, R TKA, and CABG.     OT comments  Pt making progress with functional goals. Pt ambulated to bathroom with RW for toilet activities and stood at sink for grooming/hygiene activities this session. Pt cooperative, pleasantly confused and impulsive. OT will continue to follow  Follow Up Recommendations  SNF;Supervision/Assistance - 24 hour    Equipment Recommendations  Other (comment)(TBD at next venue of care)    Recommendations for Other Services      Precautions / Restrictions Precautions Precautions: Fall Precaution Comments: monitor HR and BP Restrictions Weight Bearing Restrictions: No       Mobility Bed Mobility Overal bed mobility: Needs Assistance Bed Mobility: Supine to Sit       Sit to supine: HOB elevated;Supervision      Transfers Overall transfer level: Needs assistance Equipment used: Rolling walker (2 wheeled) Transfers: Sit to/from [facility] Sit to Stand: Min guard Stand pivot transfers: Min guard            Balance Overall balance assessment: Needs assistance Sitting-balance support: No upper extremity supported;Feet supported Sitting balance-Leahy Scale: Good     Standing balance support: Bilateral upper extremity supported;During functional activity Standing balance-Leahy Scale: Fair                             ADL either performed or assessed with clinical judgement   ADL       Grooming: Wash/dry hands;Wash/dry face;Min guard;Standing           Upper Body Dressing : Min guard;Standing       Toilet  Transfer: RW;Min guard;Ambulation;Grab bars;Regular Museum/gallery exhibitions officer and Hygiene: Minimal assistance;Sitting/lateral lean;Sit to/from stand       Functional mobility during ADLs: Min guard;Rolling walker General ADL Comments: Min guard for safety d/t pt impulsiveness     Vision Patient Visual Report: No change from baseline     Perception     Praxis      Cognition Arousal/Alertness: Awake/alert Behavior During Therapy: Impulsive Overall Cognitive Status: Impaired/Different from baseline Area of Impairment: Following commands;Safety/judgement;Problem solving                 Orientation Level: Disoriented to;Situation;Place   Memory: Decreased short-term memory Following Commands: Follows one step commands inconsistently     Problem Solving: Requires verbal cues;Requires tactile cues General Comments: pt pleasantly confused this session        Exercises     Shoulder Instructions       General Comments      Pertinent Vitals/ Pain       Pain Assessment: No/denies pain  Home Living                                          Prior Functioning/Environment              Frequency  Min 2X/week        Progress Toward Goals  OT Goals(current goals can now be found in the care  plan section)  Progress towards OT goals: Progressing toward goals  Acute Rehab OT Goals Patient Stated Goal: to go home  Plan Discharge plan remains appropriate    Co-evaluation                 AM-PAC OT "6 Clicks" Daily Activity     Outcome Measure   Help from another person eating meals?: None Help from another person taking care of personal grooming?: A Little Help from another person toileting, which includes using toliet, bedpan, or urinal?: A Little Help from another person bathing (including washing, rinsing, drying)?: A Little Help from another person to put on and taking off regular upper body clothing?: A  Little Help from another person to put on and taking off regular lower body clothing?: A Little 6 Click Score: 19    End of Session Equipment Utilized During Treatment: Rolling walker;Gait belt  OT Visit Diagnosis: Unsteadiness on feet (R26.81);Other abnormalities of gait and mobility (R26.89);Muscle weakness (generalized) (M62.81)   Activity Tolerance Patient tolerated treatment well   Patient Left in chair;with call bell/phone within reach;with chair alarm set   Nurse Communication Mobility status        Time: 1610-9604 OT Time Calculation (min): 28 min  Charges: OT General Charges $OT Visit: 1 Visit OT Treatments $Self Care/Home Management : 8-22 mins $Therapeutic Activity: 8-22 mins     Britt Bottom 03/04/2018, 12:58 PM

## 2018-03-05 DIAGNOSIS — G9341 Metabolic encephalopathy: Secondary | ICD-10-CM

## 2018-03-05 LAB — GLUCOSE, CAPILLARY
Glucose-Capillary: 84 mg/dL (ref 70–99)
Glucose-Capillary: 121 mg/dL — ABNORMAL HIGH (ref 70–99)

## 2018-03-05 MED ORDER — AMIODARONE HCL 200 MG PO TABS: 200.0000 mg | ORAL_TABLET | Freq: Two times a day (BID) | ORAL | Status: AC

## 2018-03-05 MED ORDER — ACETAMINOPHEN 325 MG PO TABS: 650.0000 mg | ORAL_TABLET | Freq: Four times a day (QID) | ORAL | Status: AC | PRN

## 2018-03-05 MED ORDER — BUDESONIDE 0.25 MG/2ML IN SUSP: 0.2500 mg | Freq: Two times a day (BID) | RESPIRATORY_TRACT | 12 refills | Status: AC

## 2018-03-05 MED ORDER — LEVALBUTEROL HCL 0.63 MG/3ML IN NEBU: 0.6300 mg | INHALATION_SOLUTION | Freq: Four times a day (QID) | RESPIRATORY_TRACT | 12 refills | Status: AC | PRN

## 2018-03-05 MED ORDER — IPRATROPIUM BROMIDE 0.02 % IN SOLN: 0.5000 mg | Freq: Four times a day (QID) | RESPIRATORY_TRACT | 12 refills | Status: AC | PRN

## 2018-03-05 MED ORDER — METOPROLOL SUCCINATE ER 25 MG PO TB24: 25.0000 mg | ORAL_TABLET | Freq: Two times a day (BID) | ORAL | Status: AC

## 2018-03-05 MED ORDER — TORSEMIDE 20 MG PO TABS: 20.0000 mg | ORAL_TABLET | Freq: Every day | ORAL | Status: AC

## 2018-03-05 MED ORDER — CYANOCOBALAMIN 500 MCG PO TABS: 500.0000 ug | ORAL_TABLET | Freq: Every day | ORAL | Status: AC

## 2018-03-05 MED ORDER — INSULIN ASPART 100 UNIT/ML ~~LOC~~ SOLN: 0.0000 [IU] | Freq: Three times a day (TID) | SUBCUTANEOUS | 11 refills | Status: AC

## 2018-03-05 MED ORDER — DIGOXIN 62.5 MCG PO TABS: 0.0625 mg | ORAL_TABLET | Freq: Every day | ORAL | Status: AC

## 2018-03-05 NOTE — Progress Notes (Addendum)
Pt will DC to: [facility] [place] date:03/05/2018 Family notified: Ronalee Belts, son Transport by: Family arranged transportation  RN, patient, and facility notified of DC. Discharge Summary sent to facility. RN given number for [phone]) [phone]. DC packet on chart. Family made arrangements to transport the patient.   CSW signing off. Thurmond Butts, [place] Social Worker [phone]

## 2018-03-05 NOTE — Care Management Note (Signed)
Case Management Note Marvetta Gibbons RN, BSN Transitions of Care Unit 4E- RN Case Manager [phone]  Patient Details  Name: Caitlyn Hardy MRN: [id number] Date of Birth: 21-Oct-1945  Subjective/Objective:    Pt admitted with AMS,  Afib, orthostatic hypothension               Action/Plan: PTA pt lived at home, has sons that assist. Per PT eval recommendation for SNF, CSW consulted for placement needs. Son will plan to assist with EMS transport to SNF in [place]  Expected Discharge Date:  03/05/18               Expected Discharge Plan:  [place]  In-House Referral:  Clinical Social Work  Discharge planning Services  CM Consult  Post Acute Care Choice:    Choice offered to:     DME Arranged:    DME Agency:     HH Arranged:    [place] Agency:     Status of Service:  Completed, signed off  If discussed at [employer] [facility] [employer], dates discussed:    Discharge Disposition: skilled facility   Additional Comments:  03/05/18- 1000 -[place] Karl RN, CM- pt medically stable for transition to SNF today- CSW following for placement needs- per Tish at [facility] pt has Information systems manager for [facility]. (contact for [place] is [phone])  Dawayne Patricia, RN 03/05/2018, 11:49 AM

## 2018-03-05 NOTE — Progress Notes (Signed)
Patient wake up from sleep,removed her CPAP,get out on bed,confused and agitated,claiming she s in her house trying to gett out in the room.Miike her son called and try her to convinced her to go back in bed.Patient was convinced to sit in the recliner.Waiting for her son Loma Sousa to come.Will continue to monitor.

## 2018-03-05 NOTE — Clinical Social Work Placement (Signed)
   CLINICAL SOCIAL WORK PLACEMENT  NOTE  Date:  03/05/2018  Patient Details  Name: Caitlyn Hardy MRN: [id number] Date of Birth: July 03, 1945  Clinical Social Work is seeking post-discharge placement for this patient at the [place] level of care (*CSW will initial, date and re-position this form in  chart as items are completed):  Yes   Patient/family provided with [place] Work Department's list of facilities offering this level of care within the geographic area requested by the patient (or if unable, by the patient's family).  Yes   Patient/family informed of their freedom to choose among providers that offer the needed level of care, that participate in Medicare, Medicaid or managed care program needed by the patient, have an available bed and are willing to accept the patient.  Yes   Patient/family informed of [facility]'s ownership interest in [facility] and [facility], as well as of the fact that they are under no obligation to receive care at these facilities.  PASRR submitted to EDS on       PASRR number received on 02/26/18     Existing PASRR number confirmed on       FL2 transmitted to all facilities in geographic area requested by pt/family on       FL2 transmitted to all facilities within larger geographic area on       Patient informed that his/her managed care company has contracts with or will negotiate with certain facilities, including the following:        Yes   Patient/family informed of bed offers received.  Patient chooses bed at [facility]     Physician recommends and patient chooses bed at      Patient to be transferred to [facility] on 03/05/18.  Patient to be transferred to facility by family- transporting      Patient family notified on 03/05/18 of transfer.  Name of family member notified:  Ronalee Belts, son     PHYSICIAN       Additional Comment:     _______________________________________________ Vinie Sill, Ray 03/05/2018, 1:03 PM

## 2018-03-05 NOTE — Progress Notes (Signed)
Spoke with RN regarding consult to discontinue PICC line prior to discharge. Currently no orders for discharge. RN to notify via consult when discharge is in progress.

## 2018-03-05 NOTE — Progress Notes (Signed)
Report given to nurse Crystal at [facility].  PICC line removed by IV team nurse.  Dressing to be changed tomorrow 03/06/18.  Vital signs obtained and were stable.  AVS reviewed with patient and family.  All questions addressed.

## 2018-03-05 NOTE — Discharge Summary (Signed)
Caitlyn Hardy, is a 72 y.o. female  DOB Feb 24, 1946  MRN [id number].  Admission date:  02/23/2018  Admitting Physician  Elwyn Reach, MD  Discharge Date:  03/05/2018   Primary MD  [name], MD  Recommendations for primary care physician for things to follow:  -Please check CBC, BMP in 3 days    Admission Diagnosis  hypotention hypokalemia   Discharge Diagnosis  hypotention hypokalemia    Principal Problem:   Orthostatic hypotension Active Problems:   Morbid obesity (HCC)   Thrombocytopenia (HCC)   Paroxysmal atrial fibrillation (HCC)   Sleep apnea   A-fib (HCC)   UTI (urinary tract infection)   Metabolic encephalopathy   (HFpEF) heart failure with preserved ejection fraction [facility])      Past Medical History:  Diagnosis Date  . Anemia   . Arthritis   . Asthma   . CAD (coronary artery disease)    a. CABG 2001. b. PCI 2011. c. NSTEMI 12/13-03/2012 in prolonged hosp stay, felt demand isch with stable cath.  . Cancer [facility])    hysterectomy 1979 r/t/ cancer  . Chronic kidney disease   . Chronic systolic CHF (congestive heart failure) ([place]    a. EF 40% 2011, down to 25% 03/2012 felt mixed isch/nonischemic (tachy-mediated).  . COPD (chronic obstructive pulmonary disease) ([place])   . CVA (cerebral infarction)    Followed by Dr. Leonie Man  . Depression   . Diabetes mellitus ([place])   . Ejection fraction    EF 40% in the past   //    EF 25% January, 2014   //   EF 30%, echo, Aug 11, 2012, mid/apical anterior akinesis and anteroseptal akinesis and apical lateral akinesis and akinesis of the true apex.  . H/O hiatal hernia   . Headache(784.0)   . Hx of amiodarone therapy    Atrial fibrillation  . HX: anticoagulation    Atrial fibrillation  . Hypertension   . Morbid obesity ([place])   . Myocardial infarction [facility])    [id number] . 2011 2014  . On home oxygen therapy   .  Persistent atrial fibrillation    a. Diagnosed 02/2012 - long hosp stay, difficult to control rates, LAA thrombus noted, needed emergent DCCV for VT/afib/hypotension 04/14/2012.  . Pulmonary hypertension ([place])    PA pressure 65 mmHg, echo, Aug 11, 2012  . Restless leg   . Sleep apnea    CPAP qhs  . Stroke [facility])    a. Stroke 02/2012 with recurrent CVA in-hospital 06/4965 felt embolic.  . Thrombocytopenia ([place])   . Thrombus of left atrial appendage    a. Possible LAA thrombus on TEE 04/01/12.    Past Surgical History:  Procedure Laterality Date  . ;    . ABDOMINAL HYSTERECTOMY    . CARDIAC CATHETERIZATION  2013  . CARDIOVERSION N/A 06/02/2014   Procedure: CARDIOVERSION;  Surgeon: Herminio Commons, MD;  Location: [facility];  Service: Endoscopy;  Laterality: N/A;  . CORONARY ARTERY BYPASS GRAFT  2001  x 4 vessels  . ESOPHAGOGASTRODUODENOSCOPY N/A 04/10/2015   Procedure: ESOPHAGOGASTRODUODENOSCOPY (EGD);  Surgeon: Laurence Spates, MD;  Location: [facility] ENDOSCOPY;  Service: Endoscopy;  Laterality: N/A;  . knee replacemnt  2000   total right  . LEFT AND RIGHT HEART CATHETERIZATION WITH CORONARY/GRAFT ANGIOGRAM N/A 03/30/2012   Procedure: LEFT AND RIGHT HEART CATHETERIZATION WITH Beatrix Fetters;  Surgeon: Sherren Mocha, MD;  Location: [facility] CATH LAB;  Service: Cardiovascular;  Laterality: N/A;  . rhinosplasty     x 2  . shoulder sx  2000   right bone spurs  . TEE WITHOUT CARDIOVERSION  04/01/2012   Procedure: TRANSESOPHAGEAL ECHOCARDIOGRAM (TEE);  Surgeon: Thayer Headings, MD;  Location: [place];  Service: Cardiovascular;  Laterality: N/A;  . TONSILLECTOMY         History of present illness and  Hospital Course:     Kindly see H&P for history of present illness and admission details, please review complete Labs, Consult reports and Test reports for all details in brief  HPI  from the history and physical done on the day of admission 02/23/2018  HPI: Caitlyn Hardy is a 72 y.o.  female with medical history significant of coronary artery disease, atrial fibrillation, history of coronary artery bypass grafting, systolic dysfunction CHF with EF of 35 to 40%, COPD, history of CVA, obstructive sleep apnea on CPAP, morbid obesity, chronic kidney disease stage III who was admitted at the hospital in [place] initially with altered mental status as well as A. fib with RVR.  Patient was later found to have UTI attributed to the cause of her altered mental status.  While in the hospital patient continued to have several symptoms including orthostatic hypotension.  She had a typical chest pain.  Her confusion was also thought to be hospital related delirium.  Per family they noted occasional hypoxemia.  Patient was initially diuresed and then had to be given some fluid.  She became orthostatic at the time and they got worried since yesterday and requested transfer to [facility].  Patient arrives here today slightly confused but oriented to herself and family.  Able to communicate.  No evidence of orthostasis on arrival.  Her blood pressure was in the 485I systolic.  She is also in sinus rhythm on arrival on telemetry.  She is notably however confused as indicated.  Son is with the patient and gave most of the history.  Repeat labs here showed urinalysis with large leukocyte Estrace WBC 6-10 and rare bacteria.  EKG showing atrial fibrillation but controlled rate.  Chest x-ray showing cardiomegaly with vascular congestion.  Chemistry is normal except for creatinine 1.61 her hemoglobin is 10.2 and platelets 126.   Hospital Course    Patient is a 72 y.o. female with history of chronic systolic heart failure, COPD on 3-4 L of oxygen at home, CAD status post CABG, PAF, probable dementia at baseline-transferred from [facility] for evaluation of altered mental status, orthostatic hypotension and A. fib with RVR.  See below for further details   Altered mental status-likely delirium:    -Patient with some baseline mild dementia, but apparently she has been been having significant hospital delirium, as well she has mild UTI contributing to it, her MRI brain with without contrast, significant for old CVA, B12 on the lower side as well, which we are repleting .EEG showing some encephalopathic, but no evidence of seizures . - will be discharged on B12 supplements   A. fib with RVR -Heart rate was difficult to  control, cardiology EP input appreciated, could not uptitrate Coreg in the setting of soft blood pressure, her amiodarone dose was increased to 200 twice daily, she was seen by EP cardiology, she was switched to Toprol-XL, with good heart rate control, currently she is A. fib heart rate controlled in the 80s , so cardiology recommendation to discharge meds amiodarone 200 mg p.o. twice daily, Zaroxolyn 0.0625 mg daily, metoprolol succinate 25 mg , apixaban 5 mg twice daily ., torsemide 20 mg oral daily . -Keep magnesium more than 2, and potassium more than 4 -Cardiology to arrange for outpatient follow-up  UTI -Treated with Cipro, no further requirements of antibiotics on discharge  Orthostatic hypotension: - Resolved.  Mild acute on chronic systolic heart failure (EF 25-30%):  - Volume status is stable-has required IV Lasix intermittently-continue Demadex. Delene Loll and Isordil has been held due to soft blood pressure, they can be resumed as an outpatient when felt appropriate by cardiology  CKD stage III: Creatinine remains close to usual baseline-follow electro lites periodically.  COPD with chronic hypoxic respiratory failure on home O2:  - No evidence of exacerbation-continue bronchodilators.    Hypothyroidism: Continue Synthroid  Hypertension: Controlled-continue metoprolol and diuretics.  DM-2: Continue with insulin sliding scale  Depression: Continue Cymbalta, Neurontin and Requip.  OSA: CPAP nightly, has had on device    Discharge Condition:    Stable  Follow UP Cardiology to arrange for outpatient follow-up with Dr. Bronson Ing in 2 to 4 weeks     Discharge Instructions  and  Discharge Medications     Discharge Instructions    Discharge instructions   Complete by:  As directed    Follow with Primary MD [name], MD in 7 days   Get CBC, CMP, 2 view Chest X ray checked  by Primary MD next visit.    Activity: As tolerated with Full fall precautions use walker/cane & assistance as needed   Disposition SNF   Diet: Heart Healthy ,Carb modified , with feeding assistance and aspiration precautions.  For Heart failure patients - Check your Weight same time everyday, if you gain over 2 pounds, or you develop in leg swelling, experience more shortness of breath or chest pain, call your Primary MD immediately. Follow Cardiac Low Salt Diet and 1.5 lit/day fluid restriction.   On your next visit with your primary care physician please Get Medicines reviewed and adjusted.   Please request your Prim.MD to go over all Hospital Tests and Procedure/Radiological results at the follow up, please get all Hospital records sent to your Prim MD by signing hospital release before you go home.   If you experience worsening of your admission symptoms, develop shortness of breath, life threatening emergency, suicidal or homicidal thoughts you must seek medical attention immediately by calling 911 or calling your MD immediately  if symptoms less severe.  You Must read complete instructions/literature along with all the possible adverse reactions/side effects for all the Medicines you take and that have been prescribed to you. Take any new Medicines after you have completely understood and accpet all the possible adverse reactions/side effects.   Do not drive, operating heavy machinery, perform activities at heights, swimming or participation in water activities or provide baby sitting services if your were admitted for syncope or  siezures until you have seen by Primary MD or a Neurologist and advised to do so again.  Do not drive when taking Pain medications.    Do not take more than prescribed Pain, Sleep and  Anxiety Medications  Special Instructions: If you have smoked or chewed Tobacco  in the last 2 yrs please stop smoking, stop any regular Alcohol  and or any Recreational drug use.  Wear Seat belts while driving.   Please note  You were cared for by a hospitalist during your hospital stay. If you have any questions about your discharge medications or the care you received while you were in the hospital after you are discharged, you can call the unit and asked to speak with the hospitalist on call if the hospitalist that took care of you is not available. Once you are discharged, your primary care physician will handle any further medical issues. Please note that NO REFILLS for any discharge medications will be authorized once you are discharged, as it is imperative that you return to your primary care physician (or establish a relationship with a primary care physician if you do not have one) for your aftercare needs so that they can reassess your need for medications and monitor your lab values.   Increase activity slowly   Complete by:  As directed      Allergies as of 03/05/2018      Reactions   Penicillins Anaphylaxis   Lyrica [pregabalin] Swelling   Rocephin [ceftriaxone Sodium In Dextrose]    Rash/hives/itching   Azithromycin Hives, Rash   Bactrim [sulfamethoxazole-trimethoprim] Hives, Rash   Catapres [clonidine Hcl] Itching, Rash   Clonidine Derivatives Itching, Rash   Sulfa Antibiotics Hives, Rash      Medication List    STOP taking these medications   carvedilol 6.25 MG tablet Commonly known as:  COREG   ENTRESTO 97-103 MG Generic drug:  sacubitril-valsartan   isosorbide dinitrate 20 MG tablet Commonly known as:  ISORDIL   isosorbide mononitrate 20 MG tablet Commonly known as:   ISMO,MONOKET   spironolactone 25 MG tablet Commonly known as:  ALDACTONE     TAKE these medications   acetaminophen 325 MG tablet Commonly known as:  TYLENOL Take 2 tablets (650 mg total) by mouth every 6 (six) hours as needed for mild pain or headache.   amiodarone 200 MG tablet Commonly known as:  PACERONE Take 1 tablet (200 mg total) by mouth 2 (two) times daily. What changed:  when to take this   atorvastatin 40 MG tablet Commonly known as:  LIPITOR Take 1 tablet every day   budesonide 0.25 MG/2ML nebulizer solution Commonly known as:  PULMICORT Take 2 mLs (0.25 mg total) by nebulization 2 (two) times daily.   busPIRone 5 MG tablet Commonly known as:  BUSPAR Take 5 mg by mouth 2 (two) times daily as needed (anxiety).   cyclobenzaprine 10 MG tablet Commonly known as:  FLEXERIL Take 10 mg by mouth 2 (two) times daily.   Digoxin 62.5 MCG Tabs Take 0.0625 mg by mouth daily.   DULoxetine 60 MG capsule Commonly known as:  CYMBALTA Take 60 mg by mouth daily.   ELIQUIS 5 MG Tabs tablet Generic drug:  apixaban Take 1 tablet (5 mg total) by mouth 2 (two) times daily.   ergocalciferol 1.25 MG (50000 UT) capsule Commonly known as:  VITAMIN D2 Take 50,000 Units by mouth once a week.   gabapentin 100 MG capsule Commonly known as:  NEURONTIN Take 100 mg by mouth 3 (three) times daily.   insulin aspart 100 UNIT/ML injection Commonly known as:  novoLOG Inject 0-9 Units into the skin 3 (three) times daily with meals. What changed:    how much  to take  when to take this   ipratropium 0.02 % nebulizer solution Commonly known as:  ATROVENT Take 2.5 mLs (0.5 mg total) by nebulization every 6 (six) hours as needed for wheezing or shortness of breath.   levalbuterol 0.63 MG/3ML nebulizer solution Commonly known as:  XOPENEX Take 3 mLs (0.63 mg total) by nebulization every 6 (six) hours as needed for wheezing or shortness of breath.   levothyroxine 25 MCG  tablet Commonly known as:  SYNTHROID, LEVOTHROID Take 25 mcg by mouth every morning.   meclizine 25 MG tablet Commonly known as:  ANTIVERT Take 25 mg by mouth 2 (two) times daily.   metoprolol succinate 25 MG 24 hr tablet Commonly known as:  TOPROL-XL Take 1 tablet (25 mg total) by mouth 2 (two) times daily.   NON FORMULARY CPAP nightly   NON FORMULARY Oxygen at 3L/min while out, prn other times.   omeprazole 20 MG capsule Commonly known as:  PRILOSEC Take 20 mg by mouth 2 (two) times daily before a meal.   rOPINIRole 1 MG tablet Commonly known as:  REQUIP Take 1-2 mg by mouth at bedtime.   temazepam 22.5 MG capsule Commonly known as:  RESTORIL Take 22.5 mg by mouth at bedtime as needed for sleep.   torsemide 20 MG tablet Commonly known as:  DEMADEX Take 1 tablet (20 mg total) by mouth daily. What changed:    medication strength  how much to take   vitamin B-12 500 MCG tablet Commonly known as:  CYANOCOBALAMIN Take 1 tablet (500 mcg total) by mouth daily.         Diet and Activity recommendation: See Discharge Instructions above   Consults obtained -  cardiology   Major procedures and Radiology Reports - PLEASE review detailed and final reports for all details, in brief -      Dg Skull 1-3 Views  Result Date: 03/02/2018 CLINICAL DATA:  MRI screening. EXAM: SKULL - 1-3 VIEW COMPARISON:  CT 04/14/2012. FINDINGS: No metallic foreign bodies are noted. Small lucencies are noted throughout the skull. Process such as myeloma or metastatic disease cannot be excluded. IMPRESSION: 1. No metallic marker foreign bodies noted. Patient cleared for MRI. 2. Small lucencies noted throughout the skull. A process such as myeloma or metastatic disease cannot be excluded. Electronically Signed   By: Marcello Moores  Register   On: 03/02/2018 09:39   Mr Brain Wo Contrast  Result Date: 03/02/2018 CLINICAL DATA:  Dementia. Worsened mental status recently. History of heart failure.  EXAM: MRI HEAD WITHOUT CONTRAST TECHNIQUE: Multiplanar, multiecho pulse sequences of the brain and surrounding structures were obtained without intravenous contrast. COMPARISON:  Head CT 04/14/2012.  Brain MRI 04/05/2012. FINDINGS: Brain: Diffusion imaging does not show any acute or subacute infarction. The brainstem is normal. There are numerous old small vessel cerebellar infarctions which were acute in 2014. Left hemisphere shows what appears to be an old cortical infarction in the lateral surface of the left temporal lobe, similar to the study of 2014. In the right posterior frontal brain, there is a brain abnormality containing calcification and or blood products, with surrounding vasogenic edema pattern versus is a chronic gliosis pattern. The region in total measures up to 5 cm in diameter. The more central component measures about 2.5 cm in diameter. No significant mass effect or shift. The question is of this represents a low to intermediate grade primary brain tumor or an unusual old infarction. I would recommend post-contrast imaging to help is characterize this  further. Vascular: Major vessels at the base of the brain show flow. Skull and upper cervical spine: Negative Sinuses/Orbits: Mucosal inflammatory changes of the left division of the sphenoid sinus. Orbits negative. Other: None IMPRESSION: No acute brain infarction. Old small vessel cerebellar infarctions. Old infarction in the lateral left temporal lobe. Right posterior frontal brain abnormality which is indeterminate for low to intermediate grade primary brain tumor versus an unusual manifestation of old infarction. I would recommend post-contrast imaging as a next step to help [place] characterize this further. These results will be called to the ordering clinician or representative by the Radiologist Assistant, and communication documented in the PACS or zVision Dashboard. Electronically Signed   By: Nelson Chimes M.D.   On: 03/02/2018 10:56   Mr  Brain W Contrast  Result Date: 03/02/2018 CLINICAL DATA:  Initial evaluation for EXAM: MRI HEAD WITH CONTRAST TECHNIQUE: Multiplanar, multiecho pulse sequences of the brain and surrounding structures were obtained with intravenous contrast. CONTRAST:  10 cc of Gadavist. COMPARISON:  Prior noncontrast brain MRI from earlier the same day. FINDINGS: Postcontrast imaging demonstrates no associated enhancement about the previously identified posterior right frontal signal changes. Given this, finding favored to reflect sequelae of chronic ischemia. No other abnormal enhancement elsewhere within the brain. Remainder of the examination is unchanged. IMPRESSION: 1. 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 A short interval follow-up study in 3 months is suggested to document stability. 2. No other abnormal enhancement elsewhere within the brain. Electronically Signed   By: Jeannine Boga M.D.   On: 03/02/2018 20:27   Dg Chest Port 1 View  Result Date: 02/24/2018 CLINICAL DATA:  PICC line placement EXAM: PORTABLE CHEST 1 VIEW COMPARISON:  02/23/2018 FINDINGS: Right-sided PICC line tip projects over the lower SVC. There are median sternotomy wires and sequelae of CABG. Unchanged cardiomegaly. Slightly increased central pulmonary vascular congestion and multifocal atelectasis of the right lung. IMPRESSION: Right-sided PICC line tip projects over the lower SVC. Electronically Signed   By: Ulyses Jarred M.D.   On: 02/24/2018 03:13   Dg Chest Port 1 View  Result Date: 02/23/2018 CLINICAL DATA:  Hypoxia EXAM: PORTABLE CHEST 1 VIEW COMPARISON:  04/03/2015 FINDINGS: Post sternotomy changes. Cardiomegaly with central vascular congestion. No pleural effusion. No pneumothorax. Tubing or lead over the right upper chest, if this is a vascular catheter takes a slightly unusual curved course distally. IMPRESSION: 1. Cardiomegaly with  vascular congestion. 2. Catheter tubing or external lead over the right chest for which clinical correlation is recommended Electronically Signed   By: Donavan Foil M.D.   On: 02/23/2018 23:56   [place] Ekg Site Rite  Result Date: 02/24/2018 If Site Rite image not attached, placement could not be confirmed due to current cardiac rhythm.   Micro Results    Recent Results (from the past 240 hour(s))  MRSA PCR Screening     Status: Abnormal   Collection Time: 02/23/18  9:16 PM  Result Value Ref Range Status   MRSA by PCR POSITIVE (A) NEGATIVE Final    Comment:        The GeneXpert MRSA Assay (FDA approved for NASAL specimens only), is one component of a comprehensive MRSA colonization surveillance program. It is not intended to diagnose MRSA infection nor to guide or monitor treatment for MRSA infections. RESULT CALLED TO, READ BACK BY AND VERIFIED WITHGearldine Shown RN [id number] 02/24/18 A BROWNING Performed at [facility],  [address] [place], [place] [address]        Today   Subjective:   Caitlyn Hardy today has no headache,no chest or  abdominal pain,no new weakness tingling or numbness,   Objective:   Blood pressure 94/64, pulse 79, temperature 97.7 F (36.5 C), temperature source Oral, resp. rate (!) 22, height 5' 6.5" (1.689 m), weight 126.1 kg, SpO2 93 %.   Intake/Output Summary (Last 24 hours) at 03/05/2018 1158 Last data filed at 03/04/2018 2111 Gross per 24 hour  Intake -  Output 500 ml  Net -500 ml    Exam Awake Alert, pleasant, confused sitting in bed eating breakfast, son at bedside  Symmetrical Chest wall movement, Good air movement bilaterally, CTAB Irr Irr,No Gallops,Rubs or new Murmurs, No Parasternal Heave, heat rate sustained in the 80s on telemetry monitor, A. fib +ve B.Sounds, Abd Soft, Non tender, No organomegaly appriciated, No rebound -guarding or rigidity. No Cyanosis, Clubbing or edema, No new Rash or bruise  Data Review   CBC w  Diff:  Lab Results  Component Value Date   WBC 4.5 03/02/2018   HGB 10.1 (L) 03/02/2018   HCT 35.3 (L) 03/02/2018   PLT 204 03/02/2018   LYMPHOPCT 32 02/23/2018   MONOPCT 6 02/23/2018   EOSPCT 3 02/23/2018   BASOPCT 1 02/23/2018    CMP:  Lab Results  Component Value Date   NA 141 03/04/2018   K 3.9 03/04/2018   CL 96 (L) 03/04/2018   CO2 35 (H) 03/04/2018   BUN 23 03/04/2018   CREATININE 1.53 (H) 03/04/2018   PROT 6.3 (L) 02/23/2018   ALBUMIN 3.7 02/23/2018   BILITOT 0.8 02/23/2018   ALKPHOS 40 02/23/2018   AST 19 02/23/2018   ALT 12 02/23/2018  .   Total Time in preparing paper work, data evaluation and todays exam - 89 minutes  Phillips Climes M.D on 03/05/2018 at 11:58 AM  [facility]   Office  [phone]

## 2018-03-05 NOTE — Progress Notes (Signed)
Patient to be discharged today per Dr. Waldron Labs, MD.  PICC line to be discontinued prior to discharge per Dr. Waldron Labs, MD.

## 2018-03-15 ENCOUNTER — Ambulatory Visit: Payer: Medicare Other | Admitting: Cardiovascular Disease

## 2018-03-16 ENCOUNTER — Encounter: Payer: Self-pay | Admitting: Cardiovascular Disease

## 2019-04-02 IMAGING — DX DG CHEST 1V PORT
1 series · 1 of 1 positions shown · non-contrast
Comparison: 02/23/2018

CLINICAL DATA: PICC line placement

EXAM:
PORTABLE CHEST 1 VIEW

[chest ap]
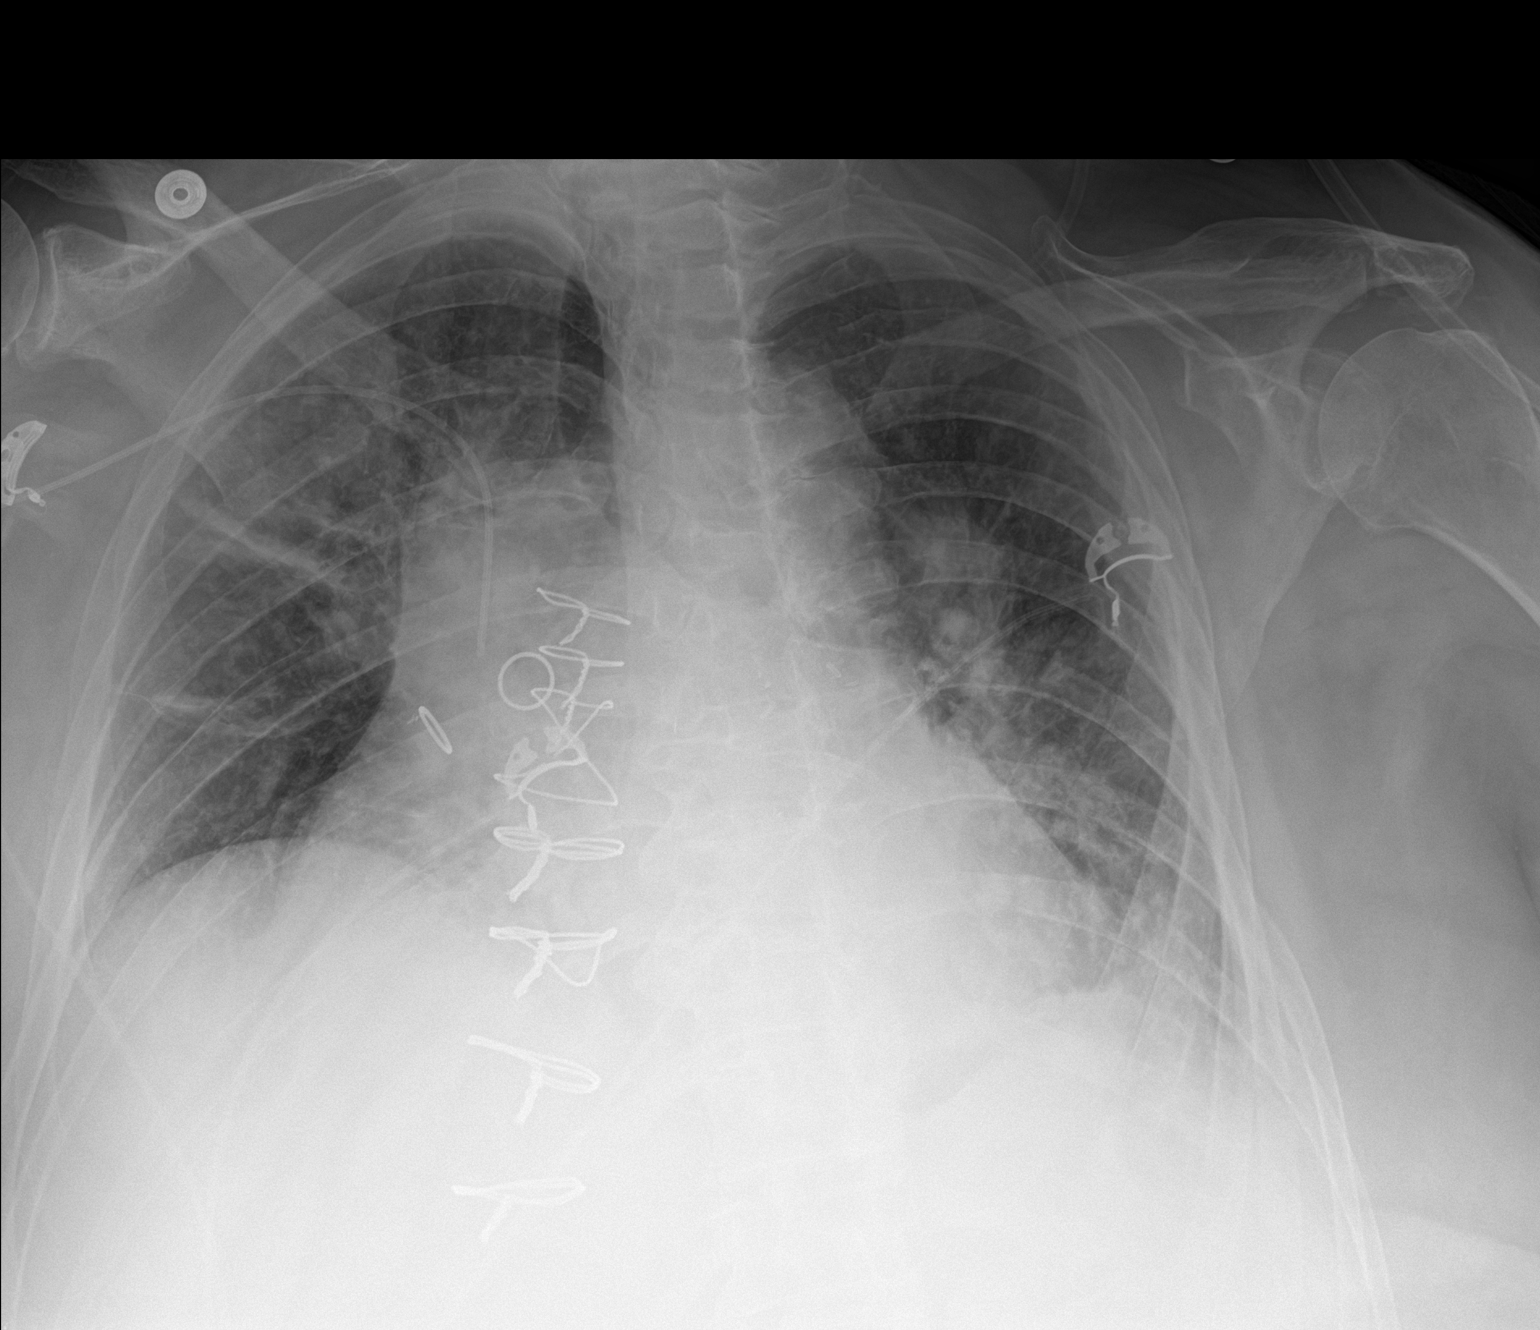

[1 of 1 positions shown; findings below may reference images not displayed]

FINDINGS: Right-sided PICC line tip projects over the lower SVC. There are
median sternotomy wires and sequelae of CABG. Unchanged
cardiomegaly. Slightly increased central pulmonary vascular
congestion and multifocal atelectasis of the right lung.
IMPRESSION: Right-sided PICC line tip projects over the lower SVC.

## 2020-08-29 DEATH — deceased
